# Patient Record
Sex: Female | Born: 1955 | Race: Black or African American | Hispanic: No | Marital: Married | Smoking: Never smoker
Health system: Southern US, Community
[De-identification: ages and names within clinical notes are randomized; demographics above are authoritative.]

## PROBLEM LIST (undated history)

## (undated) DIAGNOSIS — I10 Essential (primary) hypertension: Secondary | ICD-10-CM

## (undated) DIAGNOSIS — K760 Fatty (change of) liver, not elsewhere classified: Secondary | ICD-10-CM

## (undated) DIAGNOSIS — K429 Umbilical hernia without obstruction or gangrene: Secondary | ICD-10-CM

## (undated) DIAGNOSIS — M2550 Pain in unspecified joint: Secondary | ICD-10-CM

## (undated) DIAGNOSIS — K2970 Gastritis, unspecified, without bleeding: Secondary | ICD-10-CM

## (undated) DIAGNOSIS — J45909 Unspecified asthma, uncomplicated: Secondary | ICD-10-CM

## (undated) DIAGNOSIS — D649 Anemia, unspecified: Secondary | ICD-10-CM

## (undated) DIAGNOSIS — R600 Localized edema: Secondary | ICD-10-CM

## (undated) DIAGNOSIS — Z9889 Other specified postprocedural states: Secondary | ICD-10-CM

## (undated) DIAGNOSIS — M25559 Pain in unspecified hip: Secondary | ICD-10-CM

## (undated) DIAGNOSIS — K219 Gastro-esophageal reflux disease without esophagitis: Secondary | ICD-10-CM

## (undated) DIAGNOSIS — N951 Menopausal and female climacteric states: Secondary | ICD-10-CM

## (undated) DIAGNOSIS — E739 Lactose intolerance, unspecified: Secondary | ICD-10-CM

## (undated) DIAGNOSIS — Z86010 Personal history of colonic polyps: Secondary | ICD-10-CM

## (undated) DIAGNOSIS — I499 Cardiac arrhythmia, unspecified: Secondary | ICD-10-CM

## (undated) DIAGNOSIS — K648 Other hemorrhoids: Secondary | ICD-10-CM

## (undated) DIAGNOSIS — R112 Nausea with vomiting, unspecified: Secondary | ICD-10-CM

## (undated) DIAGNOSIS — R002 Palpitations: Secondary | ICD-10-CM

## (undated) DIAGNOSIS — I5032 Chronic diastolic (congestive) heart failure: Secondary | ICD-10-CM

## (undated) DIAGNOSIS — M1990 Unspecified osteoarthritis, unspecified site: Secondary | ICD-10-CM

## (undated) DIAGNOSIS — I4891 Unspecified atrial fibrillation: Secondary | ICD-10-CM

## (undated) DIAGNOSIS — E278 Other specified disorders of adrenal gland: Secondary | ICD-10-CM

## (undated) DIAGNOSIS — K449 Diaphragmatic hernia without obstruction or gangrene: Secondary | ICD-10-CM

## (undated) DIAGNOSIS — I480 Paroxysmal atrial fibrillation: Secondary | ICD-10-CM

## (undated) DIAGNOSIS — G4733 Obstructive sleep apnea (adult) (pediatric): Secondary | ICD-10-CM

## (undated) DIAGNOSIS — F339 Major depressive disorder, recurrent, unspecified: Secondary | ICD-10-CM

## (undated) DIAGNOSIS — J309 Allergic rhinitis, unspecified: Secondary | ICD-10-CM

## (undated) DIAGNOSIS — M549 Dorsalgia, unspecified: Secondary | ICD-10-CM

## (undated) DIAGNOSIS — T884XXA Failed or difficult intubation, initial encounter: Secondary | ICD-10-CM

## (undated) DIAGNOSIS — K5790 Diverticulosis of intestine, part unspecified, without perforation or abscess without bleeding: Secondary | ICD-10-CM

## (undated) DIAGNOSIS — L669 Cicatricial alopecia, unspecified: Secondary | ICD-10-CM

## (undated) HISTORY — DX: Pain in unspecified joint: M25.50

## (undated) HISTORY — PX: ABDOMINAL HYSTERECTOMY: SHX81

## (undated) HISTORY — DX: Essential (primary) hypertension: I10

## (undated) HISTORY — DX: Gastritis, unspecified, without bleeding: K29.70

## (undated) HISTORY — DX: Major depressive disorder, recurrent, unspecified: F33.9

## (undated) HISTORY — PX: CERVICAL FUSION: SHX112

## (undated) HISTORY — PX: BREAST REDUCTION SURGERY: SHX8

## (undated) HISTORY — DX: Unspecified atrial fibrillation: I48.91

## (undated) HISTORY — DX: Other specified disorders of adrenal gland: E27.8

## (undated) HISTORY — DX: Fatty (change of) liver, not elsewhere classified: K76.0

## (undated) HISTORY — DX: Palpitations: R00.2

## (undated) HISTORY — DX: Gastro-esophageal reflux disease without esophagitis: K21.9

## (undated) HISTORY — DX: Anemia, unspecified: D64.9

## (undated) HISTORY — PX: BUNIONECTOMY: SHX129

## (undated) HISTORY — DX: Diverticulosis of intestine, part unspecified, without perforation or abscess without bleeding: K57.90

## (undated) HISTORY — DX: Other hemorrhoids: K64.8

## (undated) HISTORY — DX: Unspecified asthma, uncomplicated: J45.909

## (undated) HISTORY — DX: Personal history of colonic polyps: Z86.010

## (undated) HISTORY — DX: Localized edema: R60.0

## (undated) HISTORY — DX: Paroxysmal atrial fibrillation: I48.0

## (undated) HISTORY — DX: Diaphragmatic hernia without obstruction or gangrene: K44.9

## (undated) HISTORY — DX: Chronic diastolic (congestive) heart failure: I50.32

## (undated) HISTORY — PX: TOTAL ABDOMINAL HYSTERECTOMY W/ BILATERAL SALPINGOOPHORECTOMY: SHX83

## (undated) HISTORY — PX: APPENDECTOMY: SHX54

## (undated) HISTORY — DX: Morbid (severe) obesity due to excess calories: E66.01

## (undated) HISTORY — DX: Menopausal and female climacteric states: N95.1

## (undated) HISTORY — DX: Pain in unspecified hip: M25.559

## (undated) HISTORY — DX: Cicatricial alopecia, unspecified: L66.9

## (undated) HISTORY — DX: Allergic rhinitis, unspecified: J30.9

## (undated) HISTORY — DX: Umbilical hernia without obstruction or gangrene: K42.9

## (undated) HISTORY — DX: Obstructive sleep apnea (adult) (pediatric): G47.33

## (undated) HISTORY — DX: Dorsalgia, unspecified: M54.9

## (undated) HISTORY — DX: Lactose intolerance, unspecified: E73.9

## (undated) HISTORY — PX: SPINE SURGERY: SHX786

---

## 1989-08-25 ENCOUNTER — Encounter: Payer: Self-pay | Admitting: Gastroenterology

## 1996-11-14 ENCOUNTER — Encounter: Payer: Self-pay | Admitting: Gastroenterology

## 1997-05-30 ENCOUNTER — Other Ambulatory Visit: Admission: RE | Admit: 1997-05-30 | Discharge: 1997-05-30 | Payer: Self-pay | Admitting: Obstetrics and Gynecology

## 1997-11-19 ENCOUNTER — Emergency Department (HOSPITAL_COMMUNITY): Admission: EM | Admit: 1997-11-19 | Discharge: 1997-11-19 | Payer: Self-pay

## 1997-12-25 ENCOUNTER — Encounter: Payer: Self-pay | Admitting: Emergency Medicine

## 1997-12-25 ENCOUNTER — Emergency Department (HOSPITAL_COMMUNITY): Admission: EM | Admit: 1997-12-25 | Discharge: 1997-12-25 | Payer: Self-pay | Admitting: Emergency Medicine

## 1998-01-18 ENCOUNTER — Encounter: Payer: Self-pay | Admitting: Gastroenterology

## 1998-06-01 ENCOUNTER — Ambulatory Visit (HOSPITAL_COMMUNITY): Admission: RE | Admit: 1998-06-01 | Discharge: 1998-06-01 | Payer: Self-pay | Admitting: Gastroenterology

## 1998-06-01 ENCOUNTER — Encounter: Payer: Self-pay | Admitting: Gastroenterology

## 1998-07-24 ENCOUNTER — Ambulatory Visit: Admission: RE | Admit: 1998-07-24 | Discharge: 1998-07-24 | Payer: Self-pay | Admitting: Internal Medicine

## 1999-03-04 ENCOUNTER — Encounter: Admission: RE | Admit: 1999-03-04 | Discharge: 1999-03-27 | Payer: Self-pay | Admitting: Internal Medicine

## 1999-05-07 ENCOUNTER — Emergency Department (HOSPITAL_COMMUNITY): Admission: EM | Admit: 1999-05-07 | Discharge: 1999-05-07 | Payer: Self-pay | Admitting: Emergency Medicine

## 1999-05-07 ENCOUNTER — Encounter: Payer: Self-pay | Admitting: Emergency Medicine

## 1999-05-11 ENCOUNTER — Encounter: Admission: RE | Admit: 1999-05-11 | Discharge: 1999-05-11 | Payer: Self-pay | Admitting: Internal Medicine

## 1999-05-11 ENCOUNTER — Encounter: Payer: Self-pay | Admitting: Internal Medicine

## 1999-10-15 ENCOUNTER — Ambulatory Visit (HOSPITAL_COMMUNITY): Admission: RE | Admit: 1999-10-15 | Discharge: 1999-10-15 | Payer: Self-pay | Admitting: Internal Medicine

## 1999-10-15 ENCOUNTER — Encounter: Payer: Self-pay | Admitting: Internal Medicine

## 2000-06-26 ENCOUNTER — Other Ambulatory Visit: Admission: RE | Admit: 2000-06-26 | Discharge: 2000-06-26 | Payer: Self-pay | Admitting: Family Medicine

## 2000-09-29 ENCOUNTER — Encounter: Payer: Self-pay | Admitting: Plastic Surgery

## 2000-09-29 ENCOUNTER — Ambulatory Visit (HOSPITAL_COMMUNITY): Admission: RE | Admit: 2000-09-29 | Discharge: 2000-09-29 | Payer: Self-pay | Admitting: Plastic Surgery

## 2000-10-06 ENCOUNTER — Encounter (INDEPENDENT_AMBULATORY_CARE_PROVIDER_SITE_OTHER): Payer: Self-pay | Admitting: Specialist

## 2000-10-06 ENCOUNTER — Ambulatory Visit (HOSPITAL_BASED_OUTPATIENT_CLINIC_OR_DEPARTMENT_OTHER): Admission: RE | Admit: 2000-10-06 | Discharge: 2000-10-07 | Payer: Self-pay | Admitting: Plastic Surgery

## 2000-10-17 ENCOUNTER — Emergency Department (HOSPITAL_COMMUNITY): Admission: EM | Admit: 2000-10-17 | Discharge: 2000-10-17 | Payer: Self-pay

## 2002-02-09 ENCOUNTER — Encounter: Payer: Self-pay | Admitting: Obstetrics and Gynecology

## 2002-02-09 ENCOUNTER — Ambulatory Visit (HOSPITAL_COMMUNITY): Admission: RE | Admit: 2002-02-09 | Discharge: 2002-02-09 | Payer: Self-pay | Admitting: Obstetrics and Gynecology

## 2002-06-03 ENCOUNTER — Encounter: Payer: Self-pay | Admitting: Gastroenterology

## 2003-01-31 ENCOUNTER — Encounter: Payer: Self-pay | Admitting: Cardiology

## 2003-01-31 ENCOUNTER — Inpatient Hospital Stay (HOSPITAL_COMMUNITY): Admission: EM | Admit: 2003-01-31 | Discharge: 2003-02-01 | Payer: Self-pay | Admitting: Emergency Medicine

## 2003-05-02 ENCOUNTER — Encounter: Admission: RE | Admit: 2003-05-02 | Discharge: 2003-05-02 | Payer: Self-pay | Admitting: Obstetrics and Gynecology

## 2003-11-14 ENCOUNTER — Encounter: Admission: RE | Admit: 2003-11-14 | Discharge: 2003-11-14 | Payer: Self-pay | Admitting: Obstetrics and Gynecology

## 2003-12-14 ENCOUNTER — Emergency Department (HOSPITAL_COMMUNITY): Admission: EM | Admit: 2003-12-14 | Discharge: 2003-12-14 | Payer: Self-pay | Admitting: Emergency Medicine

## 2004-01-31 ENCOUNTER — Ambulatory Visit: Payer: Self-pay | Admitting: Family Medicine

## 2004-02-08 ENCOUNTER — Ambulatory Visit: Payer: Self-pay | Admitting: Family Medicine

## 2004-04-09 ENCOUNTER — Inpatient Hospital Stay (HOSPITAL_COMMUNITY): Admission: RE | Admit: 2004-04-09 | Discharge: 2004-04-11 | Payer: Self-pay | Admitting: Obstetrics and Gynecology

## 2004-04-09 ENCOUNTER — Encounter (INDEPENDENT_AMBULATORY_CARE_PROVIDER_SITE_OTHER): Payer: Self-pay | Admitting: Specialist

## 2004-04-09 ENCOUNTER — Encounter (INDEPENDENT_AMBULATORY_CARE_PROVIDER_SITE_OTHER): Payer: Self-pay | Admitting: *Deleted

## 2004-04-14 ENCOUNTER — Inpatient Hospital Stay (HOSPITAL_COMMUNITY): Admission: AD | Admit: 2004-04-14 | Discharge: 2004-04-14 | Payer: Self-pay | Admitting: Obstetrics and Gynecology

## 2004-04-16 ENCOUNTER — Inpatient Hospital Stay (HOSPITAL_COMMUNITY): Admission: AD | Admit: 2004-04-16 | Discharge: 2004-04-18 | Payer: Self-pay | Admitting: Obstetrics and Gynecology

## 2004-04-30 ENCOUNTER — Encounter: Payer: Self-pay | Admitting: Gastroenterology

## 2004-04-30 ENCOUNTER — Inpatient Hospital Stay (HOSPITAL_COMMUNITY): Admission: AD | Admit: 2004-04-30 | Discharge: 2004-05-07 | Payer: Self-pay | Admitting: Gastroenterology

## 2004-04-30 ENCOUNTER — Encounter (INDEPENDENT_AMBULATORY_CARE_PROVIDER_SITE_OTHER): Payer: Self-pay | Admitting: *Deleted

## 2004-05-04 ENCOUNTER — Ambulatory Visit: Payer: Self-pay | Admitting: Internal Medicine

## 2004-10-25 ENCOUNTER — Emergency Department (HOSPITAL_COMMUNITY): Admission: EM | Admit: 2004-10-25 | Discharge: 2004-10-25 | Payer: Self-pay | Admitting: Emergency Medicine

## 2004-12-05 ENCOUNTER — Encounter: Admission: RE | Admit: 2004-12-05 | Discharge: 2004-12-05 | Payer: Self-pay | Admitting: Obstetrics and Gynecology

## 2005-02-04 ENCOUNTER — Ambulatory Visit: Payer: Self-pay | Admitting: Family Medicine

## 2005-02-11 ENCOUNTER — Ambulatory Visit: Payer: Self-pay | Admitting: Family Medicine

## 2005-02-19 ENCOUNTER — Ambulatory Visit: Payer: Self-pay | Admitting: Family Medicine

## 2005-03-06 ENCOUNTER — Encounter: Admission: RE | Admit: 2005-03-06 | Discharge: 2005-06-04 | Payer: Self-pay | Admitting: Family Medicine

## 2005-07-30 ENCOUNTER — Emergency Department (HOSPITAL_COMMUNITY): Admission: EM | Admit: 2005-07-30 | Discharge: 2005-07-30 | Payer: Self-pay | Admitting: Emergency Medicine

## 2006-01-19 ENCOUNTER — Ambulatory Visit: Payer: Self-pay | Admitting: Family Medicine

## 2006-02-10 ENCOUNTER — Ambulatory Visit: Payer: Self-pay | Admitting: Internal Medicine

## 2006-02-10 ENCOUNTER — Ambulatory Visit: Payer: Self-pay | Admitting: Cardiology

## 2006-02-10 ENCOUNTER — Inpatient Hospital Stay (HOSPITAL_COMMUNITY): Admission: EM | Admit: 2006-02-10 | Discharge: 2006-02-12 | Payer: Self-pay | Admitting: Emergency Medicine

## 2006-02-11 ENCOUNTER — Encounter: Payer: Self-pay | Admitting: Cardiology

## 2006-03-12 ENCOUNTER — Ambulatory Visit: Payer: Self-pay | Admitting: Family Medicine

## 2006-03-20 ENCOUNTER — Ambulatory Visit: Payer: Self-pay

## 2006-03-24 ENCOUNTER — Ambulatory Visit: Payer: Self-pay | Admitting: Critical Care Medicine

## 2006-03-25 ENCOUNTER — Ambulatory Visit: Payer: Self-pay | Admitting: Family Medicine

## 2006-03-25 LAB — CONVERTED CEMR LAB
ALT: 25 units/L (ref 0–40)
AST: 20 units/L (ref 0–37)
Albumin: 3.4 g/dL — ABNORMAL LOW (ref 3.5–5.2)
Alkaline Phosphatase: 49 units/L (ref 39–117)
BUN: 9 mg/dL (ref 6–23)
Basophils Absolute: 0 10*3/uL (ref 0.0–0.1)
Basophils Relative: 0 % (ref 0.0–1.0)
Bilirubin, Direct: 0.1 mg/dL (ref 0.0–0.3)
CO2: 31 meq/L (ref 19–32)
Calcium: 9.2 mg/dL (ref 8.4–10.5)
Chloride: 109 meq/L (ref 96–112)
Cholesterol: 127 mg/dL (ref 0–200)
Creatinine, Ser: 0.8 mg/dL (ref 0.4–1.2)
Eosinophils Absolute: 0.1 10*3/uL (ref 0.0–0.6)
Eosinophils Relative: 1.9 % (ref 0.0–5.0)
Free T4: 0.8 ng/dL (ref 0.6–1.6)
GFR calc Af Amer: 98 mL/min
GFR calc non Af Amer: 81 mL/min
Glucose, Bld: 90 mg/dL (ref 70–99)
HCT: 38.5 % (ref 36.0–46.0)
HDL: 40.7 mg/dL (ref >39.0)
Hemoglobin: 13.2 g/dL (ref 12.0–15.0)
LDL Cholesterol: 57 mg/dL (ref 0–99)
Lymphocytes Relative: 51.5 % — ABNORMAL HIGH (ref 12.0–46.0)
MCHC: 34.1 g/dL (ref 30.0–36.0)
MCV: 96.8 fL (ref 78.0–100.0)
Monocytes Absolute: 0.4 10*3/uL (ref 0.2–0.7)
Monocytes Relative: 7.4 % (ref 3.0–11.0)
Neutro Abs: 2.2 10*3/uL (ref 1.4–7.7)
Neutrophils Relative %: 39.2 % — ABNORMAL LOW (ref 43.0–77.0)
Platelets: 373 10*3/uL (ref 150–400)
Potassium: 3.8 meq/L (ref 3.5–5.1)
RBC: 3.98 M/uL (ref 3.87–5.11)
RDW: 12.8 % (ref 11.5–14.6)
Sodium: 145 meq/L (ref 135–145)
T3, Free: 3.2 pg/mL (ref 2.3–4.2)
TSH: 1.36 microintl units/mL (ref 0.35–5.50)
Total Bilirubin: 0.6 mg/dL (ref 0.3–1.2)
Total CHOL/HDL Ratio: 3.1
Total Protein: 6.6 g/dL (ref 6.0–8.3)
Triglycerides: 148 mg/dL (ref 0–149)
VLDL: 30 mg/dL (ref 0–40)
WBC: 5.7 10*3/uL (ref 4.5–10.5)

## 2006-03-26 ENCOUNTER — Ambulatory Visit: Payer: Self-pay | Admitting: Internal Medicine

## 2006-04-06 ENCOUNTER — Encounter: Admission: RE | Admit: 2006-04-06 | Discharge: 2006-04-06 | Payer: Self-pay | Admitting: Family Medicine

## 2006-06-15 ENCOUNTER — Encounter: Payer: Self-pay | Admitting: Family Medicine

## 2006-06-25 DIAGNOSIS — I10 Essential (primary) hypertension: Secondary | ICD-10-CM | POA: Insufficient documentation

## 2006-06-25 DIAGNOSIS — D649 Anemia, unspecified: Secondary | ICD-10-CM | POA: Insufficient documentation

## 2006-06-25 DIAGNOSIS — I4891 Unspecified atrial fibrillation: Secondary | ICD-10-CM | POA: Insufficient documentation

## 2006-06-25 HISTORY — DX: Essential (primary) hypertension: I10

## 2006-07-20 ENCOUNTER — Ambulatory Visit: Payer: Self-pay | Admitting: Family Medicine

## 2006-08-03 ENCOUNTER — Ambulatory Visit: Payer: Self-pay | Admitting: Internal Medicine

## 2006-08-04 ENCOUNTER — Telehealth (INDEPENDENT_AMBULATORY_CARE_PROVIDER_SITE_OTHER): Payer: Self-pay | Admitting: *Deleted

## 2006-08-04 DIAGNOSIS — E049 Nontoxic goiter, unspecified: Secondary | ICD-10-CM | POA: Insufficient documentation

## 2006-08-11 ENCOUNTER — Encounter (INDEPENDENT_AMBULATORY_CARE_PROVIDER_SITE_OTHER): Payer: Self-pay | Admitting: *Deleted

## 2006-08-17 ENCOUNTER — Encounter: Admission: RE | Admit: 2006-08-17 | Discharge: 2006-08-17 | Payer: Self-pay | Admitting: Family Medicine

## 2006-08-18 ENCOUNTER — Telehealth (INDEPENDENT_AMBULATORY_CARE_PROVIDER_SITE_OTHER): Payer: Self-pay | Admitting: *Deleted

## 2006-08-18 DIAGNOSIS — E041 Nontoxic single thyroid nodule: Secondary | ICD-10-CM | POA: Insufficient documentation

## 2006-09-08 ENCOUNTER — Other Ambulatory Visit: Admission: RE | Admit: 2006-09-08 | Discharge: 2006-09-08 | Payer: Self-pay | Admitting: Interventional Radiology

## 2006-09-08 ENCOUNTER — Encounter: Admission: RE | Admit: 2006-09-08 | Discharge: 2006-09-08 | Payer: Self-pay | Admitting: Endocrinology

## 2006-09-08 ENCOUNTER — Encounter (INDEPENDENT_AMBULATORY_CARE_PROVIDER_SITE_OTHER): Payer: Self-pay | Admitting: Interventional Radiology

## 2006-09-09 ENCOUNTER — Encounter: Payer: Self-pay | Admitting: Family Medicine

## 2006-10-01 ENCOUNTER — Ambulatory Visit: Payer: Self-pay | Admitting: Internal Medicine

## 2006-10-19 ENCOUNTER — Ambulatory Visit: Payer: Self-pay | Admitting: Gastroenterology

## 2006-10-19 LAB — CONVERTED CEMR LAB
ALT: 39 units/L — ABNORMAL HIGH (ref 0–35)
AST: 34 units/L (ref 0–37)
Albumin: 3.9 g/dL (ref 3.5–5.2)
Alkaline Phosphatase: 56 units/L (ref 39–117)
BUN: 5 mg/dL — ABNORMAL LOW (ref 6–23)
Basophils Absolute: 0 10*3/uL (ref 0.0–0.1)
Basophils Relative: 0.2 % (ref 0.0–1.0)
CO2: 28 meq/L (ref 19–32)
Calcium: 9.5 mg/dL (ref 8.4–10.5)
Chloride: 104 meq/L (ref 96–112)
Creatinine, Ser: 0.8 mg/dL (ref 0.4–1.2)
Eosinophils Absolute: 0.2 10*3/uL (ref 0.0–0.6)
Eosinophils Relative: 3 % (ref 0.0–5.0)
GFR calc Af Amer: 98 mL/min
GFR calc non Af Amer: 81 mL/min
Glucose, Bld: 95 mg/dL (ref 70–99)
HCT: 36.4 % (ref 36.0–46.0)
Hemoglobin: 12.5 g/dL (ref 12.0–15.0)
Lipase: 18 units/L (ref 11.0–59.0)
Lymphocytes Relative: 51.9 % — ABNORMAL HIGH (ref 12.0–46.0)
MCHC: 34.3 g/dL (ref 30.0–36.0)
MCV: 98.2 fL (ref 78.0–100.0)
Monocytes Absolute: 0.5 10*3/uL (ref 0.2–0.7)
Monocytes Relative: 8.9 % (ref 3.0–11.0)
Neutro Abs: 2 10*3/uL (ref 1.4–7.7)
Neutrophils Relative %: 36 % — ABNORMAL LOW (ref 43.0–77.0)
Platelets: 315 10*3/uL (ref 150–400)
Potassium: 3.7 meq/L (ref 3.5–5.1)
RBC: 3.71 M/uL — ABNORMAL LOW (ref 3.87–5.11)
RDW: 13.6 % (ref 11.5–14.6)
Sodium: 141 meq/L (ref 135–145)
Total Bilirubin: 0.8 mg/dL (ref 0.3–1.2)
Total Protein: 7 g/dL (ref 6.0–8.3)
WBC: 5.5 10*3/uL (ref 4.5–10.5)

## 2006-10-20 ENCOUNTER — Ambulatory Visit: Payer: Self-pay | Admitting: Cardiology

## 2006-11-02 ENCOUNTER — Ambulatory Visit: Payer: Self-pay | Admitting: Gastroenterology

## 2006-11-04 ENCOUNTER — Ambulatory Visit: Payer: Self-pay | Admitting: Internal Medicine

## 2006-11-04 DIAGNOSIS — R609 Edema, unspecified: Secondary | ICD-10-CM | POA: Insufficient documentation

## 2007-02-10 ENCOUNTER — Ambulatory Visit: Payer: Self-pay | Admitting: Gastroenterology

## 2007-02-10 LAB — CONVERTED CEMR LAB
ALT: 27 units/L (ref 0–35)
AST: 22 units/L (ref 0–37)
Albumin: 3.7 g/dL (ref 3.5–5.2)
Alkaline Phosphatase: 61 units/L (ref 39–117)
Bilirubin, Direct: 0.2 mg/dL (ref 0.0–0.3)
Total Bilirubin: 0.7 mg/dL (ref 0.3–1.2)
Total Protein: 7.1 g/dL (ref 6.0–8.3)

## 2007-03-03 DIAGNOSIS — G4733 Obstructive sleep apnea (adult) (pediatric): Secondary | ICD-10-CM | POA: Insufficient documentation

## 2007-03-03 DIAGNOSIS — K648 Other hemorrhoids: Secondary | ICD-10-CM | POA: Insufficient documentation

## 2007-03-03 DIAGNOSIS — I498 Other specified cardiac arrhythmias: Secondary | ICD-10-CM | POA: Insufficient documentation

## 2007-03-03 DIAGNOSIS — F339 Major depressive disorder, recurrent, unspecified: Secondary | ICD-10-CM | POA: Insufficient documentation

## 2007-03-03 DIAGNOSIS — K219 Gastro-esophageal reflux disease without esophagitis: Secondary | ICD-10-CM | POA: Insufficient documentation

## 2007-03-03 DIAGNOSIS — D126 Benign neoplasm of colon, unspecified: Secondary | ICD-10-CM | POA: Insufficient documentation

## 2007-03-03 DIAGNOSIS — K760 Fatty (change of) liver, not elsewhere classified: Secondary | ICD-10-CM | POA: Insufficient documentation

## 2007-03-03 DIAGNOSIS — K5790 Diverticulosis of intestine, part unspecified, without perforation or abscess without bleeding: Secondary | ICD-10-CM | POA: Insufficient documentation

## 2007-03-03 HISTORY — DX: Fatty (change of) liver, not elsewhere classified: K76.0

## 2007-03-03 HISTORY — DX: Diverticulosis of intestine, part unspecified, without perforation or abscess without bleeding: K57.90

## 2007-03-03 HISTORY — DX: Gastro-esophageal reflux disease without esophagitis: K21.9

## 2007-03-03 HISTORY — DX: Obstructive sleep apnea (adult) (pediatric): G47.33

## 2007-03-03 HISTORY — DX: Other hemorrhoids: K64.8

## 2007-03-03 HISTORY — DX: Major depressive disorder, recurrent, unspecified: F33.9

## 2007-03-27 ENCOUNTER — Encounter: Admission: RE | Admit: 2007-03-27 | Discharge: 2007-03-27 | Payer: Self-pay | Admitting: Psychiatry

## 2007-04-28 ENCOUNTER — Ambulatory Visit: Payer: Self-pay | Admitting: Gastroenterology

## 2007-04-30 ENCOUNTER — Telehealth (INDEPENDENT_AMBULATORY_CARE_PROVIDER_SITE_OTHER): Payer: Self-pay | Admitting: *Deleted

## 2007-05-14 ENCOUNTER — Encounter: Payer: Self-pay | Admitting: Family Medicine

## 2007-05-14 ENCOUNTER — Ambulatory Visit: Payer: Self-pay | Admitting: Gastroenterology

## 2007-05-14 ENCOUNTER — Encounter: Payer: Self-pay | Admitting: Gastroenterology

## 2007-05-17 ENCOUNTER — Encounter: Payer: Self-pay | Admitting: Gastroenterology

## 2007-06-11 ENCOUNTER — Emergency Department (HOSPITAL_COMMUNITY): Admission: EM | Admit: 2007-06-11 | Discharge: 2007-06-11 | Payer: Self-pay | Admitting: Emergency Medicine

## 2007-07-21 LAB — CONVERTED CEMR LAB: Pap Smear: NORMAL

## 2007-08-09 ENCOUNTER — Telehealth (INDEPENDENT_AMBULATORY_CARE_PROVIDER_SITE_OTHER): Payer: Self-pay | Admitting: *Deleted

## 2007-08-09 ENCOUNTER — Encounter: Admission: RE | Admit: 2007-08-09 | Discharge: 2007-08-09 | Payer: Self-pay | Admitting: Obstetrics and Gynecology

## 2007-08-12 ENCOUNTER — Telehealth: Payer: Self-pay | Admitting: Gastroenterology

## 2007-09-04 LAB — HM COLONOSCOPY: HM Colonoscopy: 2

## 2007-11-18 ENCOUNTER — Telehealth (INDEPENDENT_AMBULATORY_CARE_PROVIDER_SITE_OTHER): Payer: Self-pay | Admitting: *Deleted

## 2007-12-13 ENCOUNTER — Ambulatory Visit: Payer: Self-pay | Admitting: Family Medicine

## 2007-12-27 ENCOUNTER — Encounter (INDEPENDENT_AMBULATORY_CARE_PROVIDER_SITE_OTHER): Payer: Self-pay | Admitting: *Deleted

## 2007-12-27 LAB — CONVERTED CEMR LAB
ALT: 24 units/L (ref 0–35)
AST: 21 units/L (ref 0–37)
Albumin: 3.6 g/dL (ref 3.5–5.2)
Alkaline Phosphatase: 52 units/L (ref 39–117)
BUN: 10 mg/dL (ref 6–23)
Basophils Absolute: 0 10*3/uL (ref 0.0–0.1)
Basophils Relative: 0.4 % (ref 0.0–3.0)
Bilirubin, Direct: 0.1 mg/dL (ref 0.0–0.3)
CO2: 27 meq/L (ref 19–32)
Calcium: 9.4 mg/dL (ref 8.4–10.5)
Chloride: 108 meq/L (ref 96–112)
Cholesterol: 135 mg/dL (ref 0–200)
Creatinine, Ser: 0.7 mg/dL (ref 0.4–1.2)
Eosinophils Absolute: 0.2 10*3/uL (ref 0.0–0.7)
Eosinophils Relative: 3 % (ref 0.0–5.0)
GFR calc Af Amer: 114 mL/min
GFR calc non Af Amer: 94 mL/min
Glucose, Bld: 88 mg/dL (ref 70–99)
HCT: 39.8 % (ref 36.0–46.0)
HDL: 39.4 mg/dL (ref >39.0)
Hemoglobin: 13.6 g/dL (ref 12.0–15.0)
Iron: 76 ug/dL (ref 42–145)
LDL Cholesterol: 73 mg/dL (ref 0–99)
Lymphocytes Relative: 49 % — ABNORMAL HIGH (ref 12.0–46.0)
MCHC: 34.1 g/dL (ref 30.0–36.0)
MCV: 97.9 fL (ref 78.0–100.0)
Monocytes Absolute: 0.6 10*3/uL (ref 0.1–1.0)
Monocytes Relative: 10.2 % (ref 3.0–12.0)
Neutro Abs: 2 10*3/uL (ref 1.4–7.7)
Neutrophils Relative %: 37.4 % — ABNORMAL LOW (ref 43.0–77.0)
Platelets: 310 10*3/uL (ref 150–400)
Potassium: 3.7 meq/L (ref 3.5–5.1)
RBC: 4.06 M/uL (ref 3.87–5.11)
RDW: 13.1 % (ref 11.5–14.6)
Saturation Ratios: 26.6 % (ref 20.0–50.0)
Sodium: 144 meq/L (ref 135–145)
TSH: 0.71 microintl units/mL (ref 0.35–5.50)
Total Bilirubin: 0.5 mg/dL (ref 0.3–1.2)
Total CHOL/HDL Ratio: 3.4
Total Protein: 7.1 g/dL (ref 6.0–8.3)
Transferrin: 203.7 mg/dL — ABNORMAL LOW (ref >=212.0)
Triglycerides: 111 mg/dL (ref 0–149)
VLDL: 22 mg/dL (ref 0–40)
WBC: 5.4 10*3/uL (ref 4.5–10.5)

## 2008-01-28 ENCOUNTER — Ambulatory Visit: Payer: Self-pay | Admitting: Family Medicine

## 2008-01-28 DIAGNOSIS — R079 Chest pain, unspecified: Secondary | ICD-10-CM | POA: Insufficient documentation

## 2008-02-17 ENCOUNTER — Telehealth (INDEPENDENT_AMBULATORY_CARE_PROVIDER_SITE_OTHER): Payer: Self-pay | Admitting: *Deleted

## 2008-02-18 ENCOUNTER — Ambulatory Visit: Payer: Self-pay | Admitting: Family Medicine

## 2008-04-20 ENCOUNTER — Telehealth: Payer: Self-pay | Admitting: Gastroenterology

## 2008-04-21 ENCOUNTER — Ambulatory Visit: Payer: Self-pay | Admitting: Gastroenterology

## 2008-04-21 DIAGNOSIS — Z860100 Personal history of colon polyps, unspecified: Secondary | ICD-10-CM

## 2008-04-21 DIAGNOSIS — R1012 Left upper quadrant pain: Secondary | ICD-10-CM | POA: Insufficient documentation

## 2008-04-21 HISTORY — DX: Personal history of colonic polyps: Z86.010

## 2008-04-21 HISTORY — DX: Personal history of colon polyps, unspecified: Z86.0100

## 2008-04-21 LAB — CONVERTED CEMR LAB
ALT: 31 units/L (ref 0–35)
AST: 24 units/L (ref 0–37)
Albumin: 3.7 g/dL (ref 3.5–5.2)
Alkaline Phosphatase: 56 units/L (ref 39–117)
Basophils Absolute: 0.1 10*3/uL (ref 0.0–0.1)
Basophils Relative: 1.6 % (ref 0.0–3.0)
Bilirubin, Direct: 0.1 mg/dL (ref 0.0–0.3)
Eosinophils Absolute: 0.1 10*3/uL (ref 0.0–0.7)
Eosinophils Relative: 2.4 % (ref 0.0–5.0)
HCT: 40.8 % (ref 36.0–46.0)
Hemoglobin: 13.8 g/dL (ref 12.0–15.0)
Lipase: 20 units/L (ref 11.0–59.0)
Lymphocytes Relative: 47.5 % — ABNORMAL HIGH (ref 12.0–46.0)
Lymphs Abs: 2.7 10*3/uL (ref 0.7–4.0)
MCHC: 33.9 g/dL (ref 30.0–36.0)
MCV: 95.9 fL (ref 78.0–100.0)
Monocytes Absolute: 0.5 10*3/uL (ref 0.1–1.0)
Monocytes Relative: 9.7 % (ref 3.0–12.0)
Neutro Abs: 2.1 10*3/uL (ref 1.4–7.7)
Neutrophils Relative %: 38.8 % — ABNORMAL LOW (ref 43.0–77.0)
Platelets: 350 10*3/uL (ref 150.0–400.0)
RBC: 4.25 M/uL (ref 3.87–5.11)
RDW: 12.9 % (ref 11.5–14.6)
Total Bilirubin: 0.6 mg/dL (ref 0.3–1.2)
Total Protein: 7.4 g/dL (ref 6.0–8.3)
WBC: 5.5 10*3/uL (ref 4.5–10.5)

## 2008-05-08 ENCOUNTER — Telehealth: Payer: Self-pay | Admitting: Family Medicine

## 2008-05-23 ENCOUNTER — Ambulatory Visit: Payer: Self-pay | Admitting: Family Medicine

## 2008-06-01 ENCOUNTER — Encounter: Admission: RE | Admit: 2008-06-01 | Discharge: 2008-06-01 | Payer: Self-pay | Admitting: Obstetrics and Gynecology

## 2008-07-17 ENCOUNTER — Telehealth: Payer: Self-pay | Admitting: Cardiovascular Disease

## 2008-07-17 ENCOUNTER — Telehealth (INDEPENDENT_AMBULATORY_CARE_PROVIDER_SITE_OTHER): Payer: Self-pay | Admitting: *Deleted

## 2008-07-17 ENCOUNTER — Observation Stay (HOSPITAL_COMMUNITY): Admission: EM | Admit: 2008-07-17 | Discharge: 2008-07-19 | Payer: Self-pay | Admitting: Emergency Medicine

## 2008-07-18 ENCOUNTER — Ambulatory Visit: Payer: Self-pay | Admitting: Cardiology

## 2008-07-19 ENCOUNTER — Encounter: Payer: Self-pay | Admitting: Cardiology

## 2008-08-15 ENCOUNTER — Telehealth (INDEPENDENT_AMBULATORY_CARE_PROVIDER_SITE_OTHER): Payer: Self-pay | Admitting: *Deleted

## 2008-08-29 ENCOUNTER — Ambulatory Visit: Payer: Self-pay | Admitting: Internal Medicine

## 2008-09-04 ENCOUNTER — Ambulatory Visit: Payer: Self-pay | Admitting: Family Medicine

## 2008-09-04 DIAGNOSIS — N951 Menopausal and female climacteric states: Secondary | ICD-10-CM | POA: Insufficient documentation

## 2008-09-26 ENCOUNTER — Encounter: Admission: RE | Admit: 2008-09-26 | Discharge: 2008-09-26 | Payer: Self-pay | Admitting: Obstetrics and Gynecology

## 2008-12-04 ENCOUNTER — Ambulatory Visit: Payer: Self-pay | Admitting: Family Medicine

## 2008-12-11 ENCOUNTER — Emergency Department (HOSPITAL_COMMUNITY): Admission: EM | Admit: 2008-12-11 | Discharge: 2008-12-11 | Payer: Self-pay | Admitting: Emergency Medicine

## 2009-01-23 ENCOUNTER — Ambulatory Visit: Payer: Self-pay | Admitting: Family Medicine

## 2009-01-23 DIAGNOSIS — J45909 Unspecified asthma, uncomplicated: Secondary | ICD-10-CM | POA: Insufficient documentation

## 2009-01-23 HISTORY — DX: Unspecified asthma, uncomplicated: J45.909

## 2009-01-24 ENCOUNTER — Ambulatory Visit: Payer: Self-pay | Admitting: Family Medicine

## 2009-01-25 ENCOUNTER — Telehealth: Payer: Self-pay | Admitting: Family Medicine

## 2009-01-25 DIAGNOSIS — R059 Cough, unspecified: Secondary | ICD-10-CM | POA: Insufficient documentation

## 2009-01-25 DIAGNOSIS — R05 Cough: Secondary | ICD-10-CM

## 2009-02-05 ENCOUNTER — Ambulatory Visit: Payer: Self-pay | Admitting: Family Medicine

## 2009-02-05 LAB — CONVERTED CEMR LAB
Inflenza A Ag: NEGATIVE
Influenza B Ag: NEGATIVE

## 2009-02-06 ENCOUNTER — Telehealth: Payer: Self-pay | Admitting: Family Medicine

## 2009-02-06 LAB — CONVERTED CEMR LAB
BUN: 6 mg/dL (ref 6–23)
Basophils Absolute: 0 10*3/uL (ref 0.0–0.1)
Basophils Relative: 0.7 % (ref 0.0–3.0)
CO2: 26 meq/L (ref 19–32)
Calcium: 9.7 mg/dL (ref 8.4–10.5)
Chloride: 102 meq/L (ref 96–112)
Creatinine, Ser: 0.9 mg/dL (ref 0.4–1.2)
Eosinophils Absolute: 0.4 10*3/uL (ref 0.0–0.7)
Eosinophils Relative: 5.2 % — ABNORMAL HIGH (ref 0.0–5.0)
GFR calc non Af Amer: 84.62 mL/min (ref >60)
Glucose, Bld: 86 mg/dL (ref 70–99)
HCT: 44.4 % (ref 36.0–46.0)
Hemoglobin: 14.5 g/dL (ref 12.0–15.0)
Lymphocytes Relative: 17.3 % (ref 12.0–46.0)
Lymphs Abs: 1.2 10*3/uL (ref 0.7–4.0)
MCHC: 32.6 g/dL (ref 30.0–36.0)
MCV: 101.2 fL — ABNORMAL HIGH (ref 78.0–100.0)
Monocytes Absolute: 0.4 10*3/uL (ref 0.1–1.0)
Monocytes Relative: 5.1 % (ref 3.0–12.0)
Neutro Abs: 5.1 10*3/uL (ref 1.4–7.7)
Neutrophils Relative %: 71.7 % (ref 43.0–77.0)
Platelets: 258 10*3/uL (ref 150.0–400.0)
Potassium: 4.2 meq/L (ref 3.5–5.1)
RBC: 4.38 M/uL (ref 3.87–5.11)
RDW: 14.1 % (ref 11.5–14.6)
Sodium: 139 meq/L (ref 135–145)
WBC: 7.1 10*3/uL (ref 4.5–10.5)

## 2009-02-13 ENCOUNTER — Ambulatory Visit: Payer: Self-pay | Admitting: Pulmonary Disease

## 2009-02-27 ENCOUNTER — Ambulatory Visit: Payer: Self-pay | Admitting: Pulmonary Disease

## 2009-03-07 ENCOUNTER — Telehealth: Payer: Self-pay | Admitting: Gastroenterology

## 2009-03-09 ENCOUNTER — Ambulatory Visit: Payer: Self-pay | Admitting: Family Medicine

## 2009-03-09 LAB — CONVERTED CEMR LAB
Bilirubin Urine: NEGATIVE
Glucose, Urine, Semiquant: NEGATIVE
Nitrite: NEGATIVE
Specific Gravity, Urine: 1.015
Urobilinogen, UA: 0.2
WBC Urine, dipstick: NEGATIVE
pH: 6

## 2009-03-11 LAB — CONVERTED CEMR LAB
ALT: 16 units/L (ref 0–35)
AST: 15 units/L (ref 0–37)
Albumin: 4.1 g/dL (ref 3.5–5.2)
Alkaline Phosphatase: 62 units/L (ref 39–117)
Amylase: 44 units/L (ref 0–105)
BUN: 7 mg/dL (ref 6–23)
Bacteria, UA: NONE SEEN
Basophils Absolute: 0 10*3/uL (ref 0.0–0.1)
Basophils Relative: 1 % (ref 0–1)
CO2: 23 meq/L (ref 19–32)
Calcium: 9.6 mg/dL (ref 8.4–10.5)
Casts: NONE SEEN /lpf
Chloride: 104 meq/L (ref 96–112)
Creatinine, Ser: 0.8 mg/dL (ref 0.40–1.20)
Crystals: NONE SEEN
Eosinophils Absolute: 0.2 10*3/uL (ref 0.0–0.7)
Eosinophils Relative: 3 % (ref 0–5)
Glucose, Bld: 83 mg/dL (ref 70–99)
HCT: 42.5 % (ref 36.0–46.0)
Hemoglobin: 14.3 g/dL (ref 12.0–15.0)
Lipase: 13 units/L (ref 0–75)
Lymphocytes Relative: 50 % — ABNORMAL HIGH (ref 12–46)
Lymphs Abs: 3.2 10*3/uL (ref 0.7–4.0)
MCHC: 33.6 g/dL (ref 30.0–36.0)
MCV: 96.8 fL (ref 78.0–100.0)
Monocytes Absolute: 0.6 10*3/uL (ref 0.1–1.0)
Monocytes Relative: 9 % (ref 3–12)
Neutro Abs: 2.4 10*3/uL (ref 1.7–7.7)
Neutrophils Relative %: 38 % — ABNORMAL LOW (ref 43–77)
Platelets: 371 10*3/uL (ref 150–400)
Potassium: 4.1 meq/L (ref 3.5–5.3)
RBC / HPF: NONE SEEN (ref <3)
RBC: 4.39 M/uL (ref 3.87–5.11)
RDW: 13.1 % (ref 11.5–15.5)
Sodium: 141 meq/L (ref 135–145)
Squamous Epithelial / LPF: NONE SEEN /lpf
Total Bilirubin: 0.5 mg/dL (ref 0.3–1.2)
Total Protein: 7.4 g/dL (ref 6.0–8.3)
WBC, UA: NONE SEEN cells/hpf (ref <3)
WBC: 6.4 10*3/uL (ref 4.0–10.5)

## 2009-03-13 ENCOUNTER — Encounter: Payer: Self-pay | Admitting: Family Medicine

## 2009-04-02 ENCOUNTER — Ambulatory Visit: Payer: Self-pay | Admitting: Emergency Medicine

## 2009-04-03 DIAGNOSIS — J309 Allergic rhinitis, unspecified: Secondary | ICD-10-CM

## 2009-04-03 HISTORY — DX: Allergic rhinitis, unspecified: J30.9

## 2009-04-10 ENCOUNTER — Telehealth (INDEPENDENT_AMBULATORY_CARE_PROVIDER_SITE_OTHER): Payer: Self-pay | Admitting: *Deleted

## 2009-07-30 ENCOUNTER — Telehealth (INDEPENDENT_AMBULATORY_CARE_PROVIDER_SITE_OTHER): Payer: Self-pay | Admitting: *Deleted

## 2009-08-06 ENCOUNTER — Ambulatory Visit: Payer: Self-pay | Admitting: Family Medicine

## 2009-08-21 ENCOUNTER — Ambulatory Visit: Payer: Self-pay | Admitting: Family Medicine

## 2009-08-24 LAB — CONVERTED CEMR LAB
BUN: 11 mg/dL (ref 6–23)
CO2: 26 meq/L (ref 19–32)
Calcium: 9.3 mg/dL (ref 8.4–10.5)
Chloride: 104 meq/L (ref 96–112)
Creatinine, Ser: 0.7 mg/dL (ref 0.4–1.2)
GFR calc non Af Amer: 107.52 mL/min (ref >60)
Glucose, Bld: 91 mg/dL (ref 70–99)
Hgb A1c MFr Bld: 5.6 % (ref 4.6–6.5)
Potassium: 4.2 meq/L (ref 3.5–5.1)
Sodium: 140 meq/L (ref 135–145)

## 2009-08-29 ENCOUNTER — Telehealth: Payer: Self-pay | Admitting: Family Medicine

## 2009-10-18 ENCOUNTER — Encounter: Admission: RE | Admit: 2009-10-18 | Discharge: 2009-10-18 | Payer: Self-pay | Admitting: Obstetrics and Gynecology

## 2009-11-12 ENCOUNTER — Telehealth (INDEPENDENT_AMBULATORY_CARE_PROVIDER_SITE_OTHER): Payer: Self-pay | Admitting: *Deleted

## 2009-11-14 ENCOUNTER — Ambulatory Visit: Payer: Self-pay | Admitting: Family Medicine

## 2009-11-15 LAB — CONVERTED CEMR LAB
ALT: 24 units/L (ref 0–35)
AST: 22 units/L (ref 0–37)
Albumin: 3.8 g/dL (ref 3.5–5.2)
Alkaline Phosphatase: 59 units/L (ref 39–117)
BUN: 10 mg/dL (ref 6–23)
Basophils Absolute: 0 10*3/uL (ref 0.0–0.1)
Basophils Relative: 0.5 % (ref 0.0–3.0)
Bilirubin, Direct: 0.1 mg/dL (ref 0.0–0.3)
CO2: 26 meq/L (ref 19–32)
Calcium: 9 mg/dL (ref 8.4–10.5)
Chloride: 106 meq/L (ref 96–112)
Cholesterol: 134 mg/dL (ref 0–200)
Creatinine, Ser: 0.8 mg/dL (ref 0.4–1.2)
Eosinophils Absolute: 0.2 10*3/uL (ref 0.0–0.7)
Eosinophils Relative: 2.7 % (ref 0.0–5.0)
GFR calc non Af Amer: 102.55 mL/min (ref >60)
Glucose, Bld: 80 mg/dL (ref 70–99)
HCT: 38.6 % (ref 36.0–46.0)
HDL: 38.2 mg/dL — ABNORMAL LOW (ref >39.00)
Hemoglobin: 13.4 g/dL (ref 12.0–15.0)
Hgb A1c MFr Bld: 5.8 % (ref 4.6–6.5)
LDL Cholesterol: 77 mg/dL (ref 0–99)
Lymphocytes Relative: 49.8 % — ABNORMAL HIGH (ref 12.0–46.0)
Lymphs Abs: 2.8 10*3/uL (ref 0.7–4.0)
MCHC: 34.6 g/dL (ref 30.0–36.0)
MCV: 97.7 fL (ref 78.0–100.0)
Monocytes Absolute: 0.5 10*3/uL (ref 0.1–1.0)
Monocytes Relative: 8.1 % (ref 3.0–12.0)
Neutro Abs: 2.2 10*3/uL (ref 1.4–7.7)
Neutrophils Relative %: 38.9 % — ABNORMAL LOW (ref 43.0–77.0)
Platelets: 328 10*3/uL (ref 150.0–400.0)
Potassium: 4.2 meq/L (ref 3.5–5.1)
RBC: 3.96 M/uL (ref 3.87–5.11)
RDW: 13.8 % (ref 11.5–14.6)
Sodium: 141 meq/L (ref 135–145)
TSH: 1.12 microintl units/mL (ref 0.35–5.50)
Total Bilirubin: 0.5 mg/dL (ref 0.3–1.2)
Total CHOL/HDL Ratio: 4
Total Protein: 6.9 g/dL (ref 6.0–8.3)
Triglycerides: 94 mg/dL (ref 0.0–149.0)
VLDL: 18.8 mg/dL (ref 0.0–40.0)
WBC: 5.6 10*3/uL (ref 4.5–10.5)

## 2009-11-19 ENCOUNTER — Telehealth (INDEPENDENT_AMBULATORY_CARE_PROVIDER_SITE_OTHER): Payer: Self-pay | Admitting: *Deleted

## 2009-12-04 ENCOUNTER — Ambulatory Visit: Payer: Self-pay | Admitting: Pulmonary Disease

## 2009-12-04 ENCOUNTER — Telehealth (INDEPENDENT_AMBULATORY_CARE_PROVIDER_SITE_OTHER): Payer: Self-pay | Admitting: *Deleted

## 2009-12-07 ENCOUNTER — Telehealth (INDEPENDENT_AMBULATORY_CARE_PROVIDER_SITE_OTHER): Payer: Self-pay | Admitting: *Deleted

## 2009-12-26 ENCOUNTER — Ambulatory Visit: Payer: Self-pay | Admitting: Pulmonary Disease

## 2010-01-02 ENCOUNTER — Telehealth: Payer: Self-pay | Admitting: Gastroenterology

## 2010-01-02 ENCOUNTER — Telehealth (INDEPENDENT_AMBULATORY_CARE_PROVIDER_SITE_OTHER): Payer: Self-pay | Admitting: *Deleted

## 2010-01-11 ENCOUNTER — Other Ambulatory Visit: Payer: Self-pay | Admitting: Family Medicine

## 2010-01-11 ENCOUNTER — Encounter: Payer: Self-pay | Admitting: Family Medicine

## 2010-01-11 ENCOUNTER — Ambulatory Visit
Admission: RE | Admit: 2010-01-11 | Discharge: 2010-01-11 | Payer: Self-pay | Source: Home / Self Care | Attending: Family Medicine | Admitting: Family Medicine

## 2010-01-11 DIAGNOSIS — Z8679 Personal history of other diseases of the circulatory system: Secondary | ICD-10-CM | POA: Insufficient documentation

## 2010-01-11 LAB — TSH: TSH: 0.9 u[IU]/mL (ref 0.35–5.50)

## 2010-01-11 LAB — CBC WITH DIFFERENTIAL/PLATELET
Basophils Absolute: 0 10*3/uL (ref 0.0–0.1)
Basophils Relative: 0.4 % (ref 0.0–3.0)
Eosinophils Absolute: 0.1 10*3/uL (ref 0.0–0.7)
Eosinophils Relative: 1.8 % (ref 0.0–5.0)
HCT: 41 % (ref 36.0–46.0)
Hemoglobin: 13.9 g/dL (ref 12.0–15.0)
Lymphocytes Relative: 44.4 % (ref 12.0–46.0)
Lymphs Abs: 3.1 10*3/uL (ref 0.7–4.0)
MCHC: 33.9 g/dL (ref 30.0–36.0)
MCV: 98.7 fl (ref 78.0–100.0)
Monocytes Absolute: 0.6 10*3/uL (ref 0.1–1.0)
Monocytes Relative: 8.7 % (ref 3.0–12.0)
Neutro Abs: 3.2 10*3/uL (ref 1.4–7.7)
Neutrophils Relative %: 44.7 % (ref 43.0–77.0)
Platelets: 359 10*3/uL (ref 150.0–400.0)
RBC: 4.15 Mil/uL (ref 3.87–5.11)
RDW: 14 % (ref 11.5–14.6)
WBC: 7.1 10*3/uL (ref 4.5–10.5)

## 2010-01-11 LAB — BASIC METABOLIC PANEL
BUN: 11 mg/dL (ref 6–23)
CO2: 29 mEq/L (ref 19–32)
Calcium: 9.3 mg/dL (ref 8.4–10.5)
Chloride: 106 mEq/L (ref 96–112)
Creatinine, Ser: 0.8 mg/dL (ref 0.4–1.2)
GFR: 92.58 mL/min (ref >60.00)
Glucose, Bld: 81 mg/dL (ref 70–99)
Potassium: 4.7 mEq/L (ref 3.5–5.1)
Sodium: 144 mEq/L (ref 135–145)

## 2010-01-11 LAB — HEPATIC FUNCTION PANEL
ALT: 26 U/L (ref 0–35)
AST: 22 U/L (ref 0–37)
Albumin: 3.7 g/dL (ref 3.5–5.2)
Alkaline Phosphatase: 58 U/L (ref 39–117)
Bilirubin, Direct: 0.1 mg/dL (ref 0.0–0.3)
Total Bilirubin: 0.6 mg/dL (ref 0.3–1.2)
Total Protein: 6.7 g/dL (ref 6.0–8.3)

## 2010-01-11 LAB — LIPID PANEL
Cholesterol: 117 mg/dL (ref 0–200)
HDL: 39.7 mg/dL (ref >39.00)
LDL Cholesterol: 56 mg/dL (ref 0–99)
Total CHOL/HDL Ratio: 3
Triglycerides: 107 mg/dL (ref 0.0–149.0)
VLDL: 21.4 mg/dL (ref 0.0–40.0)

## 2010-01-25 ENCOUNTER — Ambulatory Visit
Admission: RE | Admit: 2010-01-25 | Discharge: 2010-01-25 | Payer: Self-pay | Source: Home / Self Care | Attending: Pulmonary Disease | Admitting: Pulmonary Disease

## 2010-01-25 DIAGNOSIS — J209 Acute bronchitis, unspecified: Secondary | ICD-10-CM | POA: Insufficient documentation

## 2010-01-27 ENCOUNTER — Encounter: Payer: Self-pay | Admitting: Obstetrics and Gynecology

## 2010-01-28 ENCOUNTER — Encounter: Payer: Self-pay | Admitting: Family Medicine

## 2010-01-28 ENCOUNTER — Ambulatory Visit
Admission: RE | Admit: 2010-01-28 | Discharge: 2010-01-28 | Payer: Self-pay | Source: Home / Self Care | Attending: Family Medicine | Admitting: Family Medicine

## 2010-01-30 ENCOUNTER — Encounter: Payer: Self-pay | Admitting: Pulmonary Disease

## 2010-01-30 ENCOUNTER — Ambulatory Visit
Admission: RE | Admit: 2010-01-30 | Discharge: 2010-01-30 | Payer: Self-pay | Source: Home / Self Care | Attending: Pulmonary Disease | Admitting: Pulmonary Disease

## 2010-01-30 LAB — CONVERTED CEMR LAB: IgE (Immunoglobulin E), Serum: 19.2 intl units/mL (ref 0.0–180.0)

## 2010-02-01 ENCOUNTER — Telehealth (INDEPENDENT_AMBULATORY_CARE_PROVIDER_SITE_OTHER): Payer: Self-pay | Admitting: *Deleted

## 2010-02-05 NOTE — Letter (Signed)
Summary: Unable To Reach-Consult Scheduled  [facility] [place]  [address] [place], [place] [address]   Phone: [phone]  Fax: [phone]    08/11/2006 MRN: [id number]    Dear Ms. Grissett,   We have been unable to reach you by phone.  Please contact our office with an updated phone number.  At the recommendation of Dr.Lowne, we have been asked to schedule you a consult with [facility] for US THYROID.  Their phone number is [phone]. They have been trying to reach you concerning scheduling your appointment, call the number of the office you are being referred to listed above and let them know when you will be available.  If you have any question please call us.     Thank you,  Ebony Cargo at [facility]

## 2010-02-05 NOTE — Procedures (Addendum)
Summary: EGD  Gastroenterology EGD   Imported By: Lowry Ram CMA 03/04/2007 12:32:27  _____________________________________________________________________  External Attachment:    Type:   Image     Comment:   External Document

## 2010-02-05 NOTE — Progress Notes (Signed)
Summary: awaiting return call  Phone Note Call from Patient Call back at Work Phone [phone] Call back at [phone] cell   Summary of Call: pt left message on machine that she would like samples of her most recent BP med until she get her rx..........................Marland KitchenFelecia Deloach CMA  July 17, 2008 2:59 PM Initial call taken by: Jeremy Johann CMA,  July 17, 2008 2:58 PM  Follow-up for Phone Call        left message to call  office.Felecia Deloach CMA  July 17, 2008 3:00 PM  Additional Follow-up for Phone Call Additional follow up Details #1::        spoke to pt husband pt has been out of med since the weekend and pt is currently being admitted to hosp. informed pt husband rx has been sent to local pharmacy and samples have been placed up front for pt as well.Felecia Deloach CMA  July 17, 2008 3:09 PM    Prescriptions: EXFORGE HCT 5-160-12.5 MG TABS (AMLODIPINE-VALSARTAN-HCTZ) 1 by mouth once daily  #30 x 3   Entered by:   Jeremy Johann CMA   Authorized by:   Loreen Freud DO   Signed by:   Jeremy Johann CMA on 07/17/2008   Method used:   Faxed to ...       [facility] [address] [phone]* (retail)       [address]       [place], [place]  [address]       Ph: [phone] or [phone]       Fax: [phone]   RxID:   [id number]

## 2010-02-05 NOTE — Assessment & Plan Note (Signed)
Summary: 2 WEEKS/ [facility]W   Visit Type:  Follow-up Copy to:  Dr. Loreen Freud Primary Provider/Referring Provider:  Loreen Freud DO  CC:  Pt here for follow up. Pt states is "100%" better. Pt does c/o clearing throat, some wheezing, and and productive cough when hot.  History of Present Illness: 53/F, never smoker referred for evalaution of cough, wheezing & dyspnea since Oct 2010. This started with a head & chest cold in october, went away , then returned. On 12/04/08 OV, she received a steroid shot, MDI & avelox & symbicort were given On 1/18 OV, received prednisone. She was seen by Dr Willa Rough (allergist). She required an ER visit 12/12/08 & again on 02/05/09 >> d-dimer neg, prednisone 30 mg , cheratussin , told she had bronchitis. CXR 1/19 no infx or effusion. Prednisone has caused weight gain.  History does not indicate any environmental trigger. Pro- air causes palpitations  Initial impresion was :Asthmatic bronchitis vs reactive airway disease - no clear trigger Take advair 500/50 sample two times a day instead of symbicort Take singulair once daily x 2 weeks Cheratussin at bedtime   February 27, 2009 2:12 PM  Much improved . "100%" better. Pt does c/o clearing throat, some wheezing, and and productive cough when hot. She denies sputum production, chest pain, orthopnea or pND.  Current Medications (verified): 1)  Calcium .[name].. 1 Tablet By Mouth Once Daily 2)  Multivitamins   Tabs (Multiple Vitamin) .... Take 1 Tablet By Mouth Once A Day 3)  Flax Seed Oil 1000 Mg Caps (Flaxseed (Linseed)) .... Take 1 Capsule By Mouth Once A Day 4)  Exforge Hct 5-160-12.5 Mg Tabs (Amlodipine-Valsartan-Hctz) .[name].. 1 By Mouth Once Daily 5)  Premarin 0.3 Mg Tabs (Estrogens Conjugated) .[name].. 1 By Mouth Once Daily 6)  Astepro 0.15 % Soln (Azelastine Hcl) .... 2 Sprays Each Nostril Once Daily As Needed 7)  Veramyst 27.5 Mcg/spray Susp (Fluticasone Furoate) .... 2 Spray Each Nostril Once Daily As Needed 8)   Pepcid 20 Mg Tabs (Famotidine) .... Take 1 Tablet By Mouth Once A Day 9)  Singulair 10 Mg Tabs (Montelukast Sodium) .... Take 1 Tablet By Mouth Once A Day  Allergies (verified): 1)  ! Adhesive Bandages Fexible (Adhesive Bandages)  Past History:  Past Medical History: Last updated: 01/23/2009 Anemia-NOS Atrial fibrillation: PAF initially documented in 2005 and again in 07/2006 Headache Hypertension GERD Gastritis fatty Liver Diverticulosis Internal Hemorroids depression Sleep apnea COLON POLYPS Asthma  Social History: Last updated: 02/13/2009 Married Never Smoked Alcohol use-no Occupation: [employer] Drug use-no Regular exercise-no 2 children  Review of Systems  The patient denies anorexia, fever, weight loss, weight gain, vision loss, decreased hearing, hoarseness, chest pain, syncope, dyspnea on exertion, peripheral edema, prolonged cough, headaches, hemoptysis, abdominal pain, melena, hematochezia, severe indigestion/heartburn, hematuria, muscle weakness, suspicious skin lesions, difficulty walking, depression, unusual weight change, and abnormal bleeding.    Vital Signs:  Patient profile:   55 year old female Height:      65 inches Weight:      232.50 pounds O2 Sat:      97 % on Room air Temp:     98.2 degrees F oral Pulse rate:   83 / minute BP sitting:   116 / 70  (left arm) Cuff size:   large  Vitals Entered By: Zackery Barefoot CMA (February 27, 2009 2:06 PM)  O2 Flow:  Room air CC: Pt here for follow up. Pt states is "100%" better. Pt does c/o clearing throat, some  wheezing, and productive cough when hot Comments Medications reviewed with patient Verified contact number and pharmacy with patient Zackery Barefoot CMA  February 27, 2009 2:08 PM    Physical Exam  Additional Exam:  Gen. Pleasant, well-nourished, in no distress, normal affect ENT - no lesions, no post nasal drip Neck: No JVD, no thyromegaly, no carotid bruits Lungs: no use of accessory  muscles, no dullness to percussion, clear without rales or rhonchi  Cardiovascular: Rhythm regular, heart sounds  normal, no murmurs or gallops, no peripheral edema Musculoskeletal: No deformities, no cyanosis or clubbing      Impression & Recommendations:  Problem # 1:  ASTHMA (ICD-493.90) Stay on adavir & singulair Discussed plan If wheezing or nasal symptoms start again Will step down if remains asymptomatic x 3 months. Stay on zyrtec for allergic diathesis.  Medications Added to Medication List This Visit: 1)  Multivitamins Tabs (Multiple vitamin) .... Take 1 tablet by mouth once a day 2)  Flax Seed Oil 1000 Mg Caps (Flaxseed (linseed)) .... Take 1 capsule by mouth once a day 3)  Pepcid 20 Mg Tabs (Famotidine) .... Take 1 tablet by mouth once a day 4)  Singulair 10 Mg Tabs (Montelukast sodium) .... Take 1 tablet by mouth once a day 5)  Advair Diskus 500-50 Mcg/dose Aepb (Fluticasone-salmeterol) .... Two times a day 6)  Zyrtec Allergy 10 Mg Tabs (Cetirizine hcl) .... Once daily  Other Orders: Prescription Created Electronically [id number]) Est. Patient Level III ([address])  Patient Instructions: 1)  Please schedule a follow-up appointment in 2 months. 2)  Stay on adavir & singulair 3)  If wheezing or nasal symptoms start again, call us Prescriptions: ZYRTEC ALLERGY 10 MG TABS (CETIRIZINE HCL) once daily  #30 x 2   Entered and Authorized by:   Comer Locket Vassie Loll MD   Signed by:   Comer Locket Vassie Loll MD on 02/27/2009   Method used:   Electronically to        [facility]. [address] [phone]* (retail)       [address]       [place], [place]  [address]       Ph: [phone] or [phone]       Fax: [phone]   RxID:   [id number] SINGULAIR 10 MG TABS (MONTELUKAST SODIUM) Take 1 tablet by mouth once a day  #30 x 2   Entered and Authorized by:   Comer Locket. Vassie Loll MD   Signed by:   Comer Locket Vassie Loll MD on 02/27/2009   Method used:   Electronically to        [facility].  [address] [phone]* (retail)       [address]       [place], [place]  [address]       Ph: [phone] or [phone]       Fax: [phone]   RxID:   [id number] ADVAIR DISKUS 500-50 MCG/DOSE AEPB (FLUTICASONE-SALMETEROL) two times a day  #1 x 2   Entered and Authorized by:   Comer Locket. Vassie Loll MD   Signed by:   Comer Locket Vassie Loll MD on 02/27/2009   Method used:   Electronically to        [facility]. [address] [phone]* (retail)       [address]       [place], [place]  [address]       Ph: [phone] or [phone]       Fax: [phone]   RxID:   [id number]

## 2010-02-05 NOTE — Assessment & Plan Note (Signed)
Summary: 2 WKS OV//PH   Vital Signs:  Patient profile:   55 year old female Weight:      224 pounds Temp:     98.5 degrees F oral BP sitting:   122 / 76  (left arm)  Vitals Entered By: Almeta Monas CMA Duncan Dull) (August 21, 2009 4:24 PM) CC: 2 week F/U also requested to be checked for Diabetes Comments pt has not started her new BP meds, per pt meds were increased at her last visit but pharmacy will not fill until previous dosage is complete. Almeta Monas CMA Duncan Dull)  August 21, 2009 4:27 PM   History of Present Illness:  Hypertension follow-up      This is a 55 year old woman who presents for Hypertension follow-up.  The patient denies lightheadedness, urinary frequency, headaches, edema, impotence, rash, and fatigue.  The patient denies the following associated symptoms: chest pain, chest pressure, exercise intolerance, dyspnea, palpitations, syncope, leg edema, and pedal edema.  Compliance with medications (by patient report) has been near 100%.  The patient reports that dietary compliance has been good.  The patient reports exercising occasionally.  Adjunctive measures currently used by the patient include salt restriction.  Back pain has improved.  Pt would also like to check BS-- secondary to fam hx diabetes.  Current Medications (verified): 1)  Calcium Carbonate-Vitamin D 600-400 Mg-Unit  Tabs (Calcium Carbonate-Vitamin D) .... Take 1 Tablet By Mouth Once A Day 2)  Multivitamins   Tabs (Multiple Vitamin) .... Take 1 Tablet By Mouth Once A Day 3)  Exforge Hct 5-160-12.5 Mg Tabs (Amlodipine-Valsartan-Hctz) .[name].. 1 By Mouth Once Daily 4)  Premarin 0.3 Mg Tabs (Estrogens Conjugated) .[name].. 1 By Mouth Once Daily 5)  Fluticasone Propionate 50 Mcg/act Susp (Fluticasone Propionate) .... 2 Puffs  Two Times A Day 6)  Zyrtec Allergy 10 Mg Caps (Cetirizine Hcl) .[name].. 1 By Mouth At Bedtime 7)  Vicodin Es 7.5-750 Mg Tabs (Hydrocodone-Acetaminophen) .[name].. 1 By Mouth Every 6 Hours As Needed 8)   Flexeril 10 Mg Tabs (Cyclobenzaprine Hcl) .[name].. 1 By Mouth Three Times A Day As Needed  Allergies (verified): 1)  ! Adhesive Bandages Fexible (Adhesive Bandages)  Past History:  Past medical, surgical, family and social histories (including risk factors) reviewed for relevance to current acute and chronic problems.  Past Medical History: Reviewed history from 01/23/2009 and no changes required. Anemia-NOS Atrial fibrillation: PAF initially documented in 2005 and again in 07/2006 Headache Hypertension GERD Gastritis fatty Liver Diverticulosis Internal Hemorroids depression Sleep apnea COLON POLYPS Asthma  Past Surgical History: Reviewed history from 04/21/2008 and no changes required. Hysterectomy--partial Appendectomy Colonoscopy LAST DONE 5/09 Breast reduction TAH-->BSO cervical fusion  Family History: Reviewed history from 01/28/2008 and no changes required. Family History Diabetes 1st degree relative Family History of CAD Female 1st degree relative <60  Social History: Reviewed history from 02/13/2009 and no changes required. Married Never Smoked Alcohol use-no Occupation: [employer] Drug use-no Regular exercise-no 2 children  Review of Systems      See HPI  Physical Exam  General:  Well-developed,well-nourished,in no acute distress; alert,appropriate and cooperative throughout examination Lungs:  Normal respiratory effort, chest expands symmetrically. Lungs are clear to auscultation, no crackles or wheezes. Heart:  Normal rate and regular rhythm. S1 and S2 normal without gallop, murmur, click, rub or other extra sounds. Extremities:  No clubbing, cyanosis, edema, or deformity noted with normal full range of motion of all joints.   Skin:  Intact without suspicious lesions or rashes Cervical  Nodes:  No lymphadenopathy noted Psych:  Cognition and judgment appear intact. Alert and cooperative with normal attention span and concentration. No apparent delusions,  illusions, hallucinations   Impression & Recommendations:  Problem # 1:  HYPERTENSION (ICD-401.9)  Her updated medication list for this problem includes:    Exforge Hct 5-160-12.5 Mg Tabs (Amlodipine-valsartan-hctz) .[name]... 1 by mouth once daily  BP today: 122/76 Prior BP: 130/84 (08/06/2009)  Labs Reviewed: K+: 4.1 (03/09/2009) Creat: : 0.80 (03/09/2009)   Chol: 135 (12/13/2007)   HDL: 39.4 (12/13/2007)   LDL: 73 (12/13/2007)   TG: 111 (12/13/2007)  Problem # 2:  BACK PAIN, LUMBAR (ICD-724.2) Assessment: Improved  Her updated medication list for this problem includes:    Vicodin Es 7.5-750 Mg Tabs (Hydrocodone-acetaminophen) .[name]... 1 by mouth every 6 hours as needed    Flexeril 10 Mg Tabs (Cyclobenzaprine hcl) .[name]... 1 by mouth three times a day as needed  Discussed use of moist heat or ice, modified activities, medications, and stretching/strengthening exercises. Back care instructions given. To be seen in 2 weeks if no improvement; sooner if worsening of symptoms.   Problem # 3:  FAMILY HISTORY DIABETES 1ST DEGREE RELATIVE (ICD-V18.0)  Orders: Venipuncture ([address]) TLB-BMP (Basic Metabolic Panel-BMET) (80048-METABOL) TLB-A1C / Hgb A1C (Glycohemoglobin) (83036-A1C)  Complete Medication List: 1)  Calcium Carbonate-vitamin D 600-400 Mg-unit Tabs (Calcium carbonate-vitamin d) .... Take 1 tablet by mouth once a day 2)  Multivitamins Tabs (Multiple vitamin) .... Take 1 tablet by mouth once a day 3)  Exforge Hct 5-160-12.5 Mg Tabs (Amlodipine-valsartan-hctz) .[name].. 1 by mouth once daily 4)  Premarin 0.3 Mg Tabs (Estrogens conjugated) .[name].. 1 by mouth once daily 5)  Fluticasone Propionate 50 Mcg/act Susp (Fluticasone propionate) .... 2 puffs  two times a day 6)  Zyrtec Allergy 10 Mg Caps (Cetirizine hcl) .[name].. 1 by mouth at bedtime 7)  Vicodin Es 7.5-750 Mg Tabs (Hydrocodone-acetaminophen) .[name].. 1 by mouth every 6 hours as needed 8)  Flexeril 10 Mg Tabs (Cyclobenzaprine hcl) .[name].. 1 by mouth  three times a day as needed  Patient Instructions: 1)  Please schedule a follow-up appointment in 3 months--  come fasting at that time and we will get labs 2)  You need to lose weight. Consider a lower calorie diet and regular exercise.  3)  Limit your Sodium(salt) .  Prescriptions: EXFORGE HCT 5-160-12.5 MG TABS (AMLODIPINE-VALSARTAN-HCTZ) 1 by mouth once daily  #90 x 3   Entered and Authorized by:   Loreen Freud DO   Signed by:   Loreen Freud DO on 08/21/2009   Method used:   Electronically to        [facility]. [address] [phone]* (retail)       [address]       [place], [place]  [address]       Ph: [phone] or [phone]       Fax: [phone]   RxID:   [id number]   Appended Document: 2 WKS OV//PH

## 2010-02-05 NOTE — Assessment & Plan Note (Signed)
Summary: cough//fd   Vital Signs:  Patient profile:   55 year old female Weight:      228 pounds Temp:     98.0 degrees F oral Pulse rate:   88 / minute Pulse rhythm:   regular BP sitting:   130 / 80  (left arm) Cuff size:   large  Vitals Entered By: Army Fossa CMA (January 23, 2009 2:29 PM) CC: Pt c/o cough since october- states now it taste like she has metal in her mouth. , Cough   History of Present Illness:  Cough      This is a 55 year old woman who presents with Cough.  The symptoms began 4-6 months ago.  The patient reports productive cough and wheezing, but denies non-productive cough, pleuritic chest pain, shortness of breath, exertional dyspnea, fever, hemoptysis, and malaise.  The patient denies the following symptoms: cold/URI symptoms, sore throat, nasal congestion, chronic rhinitis, weight loss, acid reflux symptoms, and peripheral edema.  The cough is worse with exercise and activity.  Ineffective prior treatments have included OTC cough medication, albuterol inhaler, and other asthma medication.  Risk factors include history of asthma.  Diagnostic testing to date has included sinus CT scan.    Asthma History    Initial Asthma Severity Rating:    Age range: 12+ years    Symptoms: throughout the day    Nighttime Awakenings: often 7/week    Interferes w/ normal activity: some limitations    SABA use (not for EIB): several times per day    Exacerbations requiring oral systemic steroids: 2 or more/year    Asthma Severity Assessment: Severe Persistent   Current Medications (verified): 1)  Calcium .[name].. 1 Tablet By Mouth Once Daily 2)  Multivitamin .[name].. 1 Tablet By Mouth Once Daily 3)  Flaxseed Oil .[name].. 1 Tablet By Mouth Once Daily 4)  Exforge Hct 5-160-12.5 Mg Tabs (Amlodipine-Valsartan-Hctz) .[name].. 1 By Mouth Once Daily 5)  Premarin 0.3 Mg Tabs (Estrogens Conjugated) .[name].. 1 By Mouth Once Daily 6)  Symbicort 160-4.5 Mcg/act Aero (Budesonide-Formoterol Fumarate)  .... 2 Puffs Two Times A Day 7)  Astepro 0.15 % Soln (Azelastine Hcl) .... 2 Sprays Each Nostril Once Daily 8)  Veramyst 27.5 Mcg/spray Susp (Fluticasone Furoate) .... 2 Spray Each Nostril Once Daily 9)  Proair Hfa 108 (90 Base) Mcg/act Aers (Albuterol Sulfate) .... 2 Puffs Qid As Needed 10)  Prednisone 10 Mg Tabs (Prednisone) .... 3 By Mouth Once Daily For 3 Days Then 2 By Mouth Once Daily For 3 Days Then 1 By Mouth Once Daily For 3 Days  Allergies: 1)  ! Adhesive Bandages Fexible (Adhesive Bandages)  Past History:  Past medical, surgical, family and social histories (including risk factors) reviewed for relevance to current acute and chronic problems.  Past Medical History: Anemia-NOS Atrial fibrillation: PAF initially documented in 2005 and again in 07/2006 Headache Hypertension GERD Gastritis fatty Liver Diverticulosis Internal Hemorroids depression Sleep apnea COLON POLYPS Asthma  Past Surgical History: Reviewed history from 04/21/2008 and no changes required. Hysterectomy--partial Appendectomy Colonoscopy LAST DONE 5/09 Breast reduction TAH-->BSO cervical fusion  Family History: Reviewed history from 01/28/2008 and no changes required. Family History Diabetes 1st degree relative Family History of CAD Female 1st degree relative <60  Social History: Reviewed history from 01/28/2008 and no changes required. Married Never Smoked Alcohol use-no Occupation: [employer]. Drug use-no Regular exercise-no  Review of Systems      See HPI  Physical Exam  General:  Well-developed,well-nourished,in no acute distress;  alert,appropriate and cooperative throughout examination Ears:  External ear exam shows no significant lesions or deformities.  Otoscopic examination reveals clear canals, tympanic membranes are intact bilaterally without bulging, retraction, inflammation or discharge. Hearing is grossly normal bilaterally. Nose:  External nasal examination shows no deformity  or inflammation. Nasal mucosa are pink and moist without lesions or exudates. Mouth:  Oral mucosa and oropharynx without lesions or exudates.  Teeth in good repair. Neck:  No deformities, masses, or tenderness noted. Lungs:  R wheezes and L wheezes.   Heart:  Normal rate and regular rhythm. S1 and S2 normal without gallop, murmur, click, rub or other extra sounds. Extremities:  No clubbing, cyanosis, edema, or deformity noted with normal full range of motion of all joints.   Psych:  Oriented X3 and normally interactive.     Impression & Recommendations:  Problem # 1:  ASTHMA (ICD-493.90)  The following medications were removed from the medication list:    Symbicort 160-4.5 Mcg/act Aero (Budesonide-formoterol fumarate) .[name]... 2 puffs two times a day Her updated medication list for this problem includes:    Symbicort 160-4.5 Mcg/act Aero (Budesonide-formoterol fumarate) .[name]... 2 puffs two times a day    Proair Hfa 108 (90 Base) Mcg/act Aers (Albuterol sulfate) .[name]... 2 puffs qid as needed    Prednisone 10 Mg Tabs (Prednisone) .Marland KitchenMarland KitchenMarland KitchenMarland Kitchen 3 by mouth once daily for 3 days then 2 by mouth once daily for 3 days then 1 by mouth once daily for 3 days  Orders: Admin of Therapeutic Inj  intramuscular or subcutaneous ([address]) Depo- Medrol 80mg  (J1040) T-2 View CXR (71020TC)  Complete Medication List: 1)  Calcium  .[name].. 1 tablet by mouth once daily 2)  Multivitamin  .[name].. 1 tablet by mouth once daily 3)  Flaxseed Oil  .[name].. 1 tablet by mouth once daily 4)  Exforge Hct 5-160-12.5 Mg Tabs (Amlodipine-valsartan-hctz) .[name].. 1 by mouth once daily 5)  Premarin 0.3 Mg Tabs (Estrogens conjugated) .[name].. 1 by mouth once daily 6)  Symbicort 160-4.5 Mcg/act Aero (Budesonide-formoterol fumarate) .... 2 puffs two times a day 7)  Astepro 0.15 % Soln (Azelastine hcl) .... 2 sprays each nostril once daily 8)  Veramyst 27.5 Mcg/spray Susp (Fluticasone furoate) .... 2 spray each nostril once daily 9)  Proair Hfa 108 (90 Base)  Mcg/act Aers (Albuterol sulfate) .... 2 puffs qid as needed 10)  Prednisone 10 Mg Tabs (Prednisone) .... 3 by mouth once daily for 3 days then 2 by mouth once daily for 3 days then 1 by mouth once daily for 3 days Prescriptions: PREDNISONE 10 MG TABS (PREDNISONE) 3 by mouth once daily for 3 days then 2 by mouth once daily for 3 days then 1 by mouth once daily for 3 days  #18 x 0   Entered and Authorized by:   Loreen Freud DO   Signed by:   Loreen Freud DO on 01/23/2009   Method used:   Electronically to        [facility]. [address] [phone]* (retail)       [address]       [place], [place]  [address]       Ph: [phone] or [phone]       Fax: [phone]   RxID:   [id number] SYMBICORT 160-4.5 MCG/ACT AERO (BUDESONIDE-FORMOTEROL FUMARATE) 2 puffs two times a day  #1 x 5   Entered and Authorized by:   Loreen Freud DO   Signed by:  Loreen Freud DO on 01/23/2009   Method used:   Print then Give to Patient   RxID:   [id number]    Medication Administration  Injection # 1:    Medication: Depo- Medrol 80mg     Diagnosis: ASTHMA (ICD-493.90)    Route: IM    Site: RUOQ gluteus    Exp Date: 10/2009    Lot #: [id number]    Mfr: [email or web address]    Patient tolerated injection without complications    Given by: Army Fossa CMA (January 23, 2009 3:07 PM)  Orders Added: 1)  Admin of Therapeutic Inj  intramuscular or subcutaneous [96372] 2)  Depo- Medrol 80mg  [J1040] 3)  T-2 View CXR [71020TC] 4)  Est. Patient Level IV [[address]]

## 2010-02-05 NOTE — Assessment & Plan Note (Signed)
Summary: Acute NP follow up - bronchitis, asthma   Copy to:  Dr. Loreen Freud Primary Provider/Referring Provider:  Loreen Freud DO  CC:  prod cough with yellow mucus, wheezing x3days, wonders if allergies may be the culprit and would like to discuss a referral - denies dyspnea, and f/c/s.  History of Present Illness: 55/F, never smoker referred for evalaution of cough, wheezing & dyspnea since Oct 2010. This started with a head & chest cold in october, went away , then returned. On 12/04/08 OV, she received a steroid shot, MDI & avelox & symbicort were given On 1/18 OV, received prednisone. She was seen by Dr Willa Rough (allergist). She required an ER visit 12/12/08 & again on 02/05/09 >> d-dimer neg, prednisone 30 mg , cheratussin , told she had bronchitis. CXR 1/19 no infx or effusion. Prednisone has caused weight gain.  History does not indicate any environmental trigger. Pro- air causes palpitations  Initial impresion was :Asthmatic bronchitis vs reactive airway disease - no clear trigger Take advair 500/50 sample two times a day instead of symbicort Take singulair once daily x 2 weeks Cheratussin at bedtime   February 27, 2009 2:12 PM  Much improved . "100%" better. Pt does c/o clearing throat, some wheezing, and and productive cough when hot. She denies sputum production, chest pain, orthopnea or pND.  April 02, 2009--Presents for an acute office visit Complains of  prod cough with yellow mucus, wheezing x3days. Wonders if allergies may be the culprit and would like to discuss a referral - She is not taking any meds for prevention. Says the pollen, flowers make her symptoms worse.  Could not afford Advair or Singulair. Denies chest pain, dyspnea, orthopnea, hemoptysis, fever, n/v/d, edema, headache.   Medications Prior to Update: 1)  Calcium .[name].. 1 Tablet By Mouth Once Daily 2)  Multivitamins   Tabs (Multiple Vitamin) .... Take 1 Tablet By Mouth Once A Day 3)  Exforge Hct 5-160-12.5 Mg  Tabs (Amlodipine-Valsartan-Hctz) .[name].. 1 By Mouth Once Daily 4)  Premarin 0.3 Mg Tabs (Estrogens Conjugated) .[name].. 1 By Mouth Once Daily  Current Medications (verified): 1)  Calcium Carbonate-Vitamin D 600-400 Mg-Unit  Tabs (Calcium Carbonate-Vitamin D) .... Take 1 Tablet By Mouth Once A Day 2)  Multivitamins   Tabs (Multiple Vitamin) .... Take 1 Tablet By Mouth Once A Day 3)  Exforge Hct 5-160-12.5 Mg Tabs (Amlodipine-Valsartan-Hctz) .[name].. 1 By Mouth Once Daily 4)  Premarin 0.3 Mg Tabs (Estrogens Conjugated) .[name].. 1 By Mouth Once Daily  Allergies (verified): 1)  ! Adhesive Bandages Fexible (Adhesive Bandages)  Past History:  Past Medical History: Last updated: 01/23/2009 Anemia-NOS Atrial fibrillation: PAF initially documented in 2005 and again in 07/2006 Headache Hypertension GERD Gastritis fatty Liver Diverticulosis Internal Hemorroids depression Sleep apnea COLON POLYPS Asthma  Past Surgical History: Last updated: 04/21/2008 Hysterectomy--partial Appendectomy Colonoscopy LAST DONE 5/09 Breast reduction TAH-->BSO cervical fusion  Family History: Last updated: 01/28/2008 Family History Diabetes 1st degree relative Family History of CAD Female 1st degree relative <60  Social History: Last updated: 02/13/2009 Married Never Smoked Alcohol use-no Occupation: [employer] Drug use-no Regular exercise-no 2 children  Risk Factors: Alcohol Use: 0 (03/09/2009) Caffeine Use: 0 (01/28/2008) Exercise: no (03/09/2009)  Risk Factors: Smoking Status: never (03/09/2009) Passive Smoke Exposure: no (01/28/2008)  Review of Systems      See HPI  Vital Signs:  Patient profile:   55 year old female Height:      65 inches Weight:      229.38 pounds  BMI:     38.31 O2 Sat:      95 % on Room air Temp:     97.3 degrees F oral Pulse rate:   74 / minute BP sitting:   150 / 78  (right arm) Cuff size:   large  Vitals Entered By: Boone Master CNA (April 02, 2009 10:47  AM)  O2 Flow:  Room air CC: prod cough with yellow mucus, wheezing x3days, wonders if allergies may be the culprit and would like to discuss a referral - denies dyspnea, f/c/s Is Patient Diabetic? No Comments Medications reviewed with patient Daytime contact number verified with patient. Boone Master CNA  April 02, 2009 10:48 AM    Physical Exam  Additional Exam:  Gen. Pleasant, well-nourished, in no distress, normal affect ENT - no lesions, pale nasal mucosa.  Neck: No JVD, no thyromegaly, no carotid bruits Lungs: no use of accessory muscles, no dullness to percussion, clear without rales or rhonchi  Cardiovascular: Rhythm regular, heart sounds  normal, no murmurs or gallops, no peripheral edema Musculoskeletal: No deformities, no cyanosis or clubbing      Impression & Recommendations:  Problem # 1:  ALLERGIC RHINITIS (ICD-477.9)  Flare . We discussed affordable options for prevention REC:  Saline nasal rinses two times a day Lloyd Huger Med Sinus rinse) Begin Veramyst 1 puff two times a day -when done begin generic Fluticasone 2 puffs two times a day ( I sent this prescription to pharmacy. ) Begin Zyrtec (generic brand is fine) at bedtime  May use Mucinex DM two times a day as needed cough/congesiton  Increase fluids, ie-water follow up 2 weeks and as needed  Please contact office for sooner follow up if symptoms do not improve or worsen  Her updated medication list for this problem includes:    Fluticasone Propionate 50 Mcg/act Susp (Fluticasone propionate) .[name]... 2 puffs  two times a day    Zyrtec Allergy 10 Mg Caps (Cetirizine hcl) .[name]... 1 by mouth at bedtime  Orders: Est. Patient Level III ([address])  Medications Added to Medication List This Visit: 1)  Calcium Carbonate-vitamin D 600-400 Mg-unit Tabs (Calcium carbonate-vitamin d) .... Take 1 tablet by mouth once a day 2)  Fluticasone Propionate 50 Mcg/act Susp (Fluticasone propionate) .... 2 puffs  two times a day 3)   Zyrtec Allergy 10 Mg Caps (Cetirizine hcl) .[name].. 1 by mouth at bedtime  Complete Medication List: 1)  Calcium Carbonate-vitamin D 600-400 Mg-unit Tabs (Calcium carbonate-vitamin d) .... Take 1 tablet by mouth once a day 2)  Multivitamins Tabs (Multiple vitamin) .... Take 1 tablet by mouth once a day 3)  Exforge Hct 5-160-12.5 Mg Tabs (Amlodipine-valsartan-hctz) .[name].. 1 by mouth once daily 4)  Premarin 0.3 Mg Tabs (Estrogens conjugated) .[name].. 1 by mouth once daily 5)  Fluticasone Propionate 50 Mcg/act Susp (Fluticasone propionate) .... 2 puffs  two times a day 6)  Zyrtec Allergy 10 Mg Caps (Cetirizine hcl) .[name].. 1 by mouth at bedtime  Patient Instructions: 1)  Saline nasal rinses two times a day Lloyd Huger Med Sinus rinse) 2)  Begin Veramyst 1 puff two times a day -when done begin generic Fluticasone 2 puffs two times a day ( I sent this prescription to pharmacy. ) 3)  Begin Zyrtec (generic brand is fine) at bedtime  4)  May use Mucinex DM two times a day as needed cough/congesiton  5)  Increase fluids, ie-water 6)  follow up 2 weeks and as needed  7)  Please contact office for  sooner follow up if symptoms do not improve or worsen  Prescriptions: FLUTICASONE PROPIONATE 50 MCG/ACT SUSP (FLUTICASONE PROPIONATE) 2 puffs  two times a day  #1 x 5   Entered and Authorized by:   Rubye Oaks NP   Signed by:   Altamese Deguire NP on 04/02/2009   Method used:   Electronically to        [facility]. [address]. [phone]* (retail)       [address]       [place], [place]  [address]       Ph: [phone] or [phone]       Fax: [phone]   RxID:   [id number]

## 2010-02-05 NOTE — Assessment & Plan Note (Signed)
Summary: rto 3 months.cbs   Vital Signs:  Patient profile:   55 year old female Weight:      228.0 pounds Temp:     98.1 degrees F oral Pulse rate:   74 / minute Pulse rhythm:   regular BP sitting:   140 / 88  (left arm) Cuff size:   large  Vitals Entered By: Almeta Monas CMA Duncan Dull) (November 14, 2009 11:11 AM) CC: 3 mo f/u-- wants DM check up and c/o rash on legs and arms-- Recently changed detergents, Rash   History of Present Illness:  Rash      This is a 55 year old woman who presents with Rash.  The symptoms began 3 weeks ago.  The patient complains of papules, but denies macules, nodules, hives, welts, pustules, blisters, ulcers, itching, scaling, weeping, oozing, redness, increased warmth, and tenderness.  The rash is located on the right arm, left arm, right leg, and left leg.  The rash is worse with heat and better with cold.  The patient denies the following symptoms: fever, headache, facial swelling, tongue swelling, burning, difficulty breathing, abdominal pain, nausea, vomiting, diarrhea, dizziness, sore throat, dysuria, eye symptoms, arthralgias, and vaginal discharge.  The patient reports a history of new topical exposure.  The patient denies history of recent tick bite, recent tick exposure, other insect bite, recent infection, recent antibiotic use, new medication, new clothing, recent travel, pet/animal contact, thyroid disease, chronic liver disease, autoimmune disease, chronic edema, and prior STD.    Pt is also concerned about DM and would like labs done.  Current Medications (verified): 1)  Calcium Carbonate-Vitamin D 600-400 Mg-Unit  Tabs (Calcium Carbonate-Vitamin D) .... Take 1 Tablet By Mouth Once A Day 2)  Multivitamins   Tabs (Multiple Vitamin) .... Take 1 Tablet By Mouth Once A Day 3)  Exforge 5-160 Mg Tabs (Amlodipine Besylate-Valsartan) .... Take 1 Tab Once Daily 4)  Premarin 0.45 Mg Tabs (Estrogens Conjugated) .[name].. 1 By Mouth Once Daily 5)  Fluticasone  Propionate 50 Mcg/act Susp (Fluticasone Propionate) .... 2 Puffs  Two Times A Day 6)  Zyrtec Allergy 10 Mg Caps (Cetirizine Hcl) .[name].. 1 By Mouth At Bedtime 7)  Vicodin Es 7.5-750 Mg Tabs (Hydrocodone-Acetaminophen) .[name].. 1 By Mouth Every 6 Hours As Needed 8)  Flexeril 10 Mg Tabs (Cyclobenzaprine Hcl) .[name].. 1 By Mouth Three Times A Day As Needed 9)  Hydrochlorothiazide 12.5 Mg Tabs (Hydrochlorothiazide) .... Take 1 Tab Once Daily 10)  Prednisone 10 Mg Tabs (Prednisone) .... 3 By Mouth Once Daily For 3 Days Then 2 By Mouth Once Daily For 3 Days Then 1 By Mouth Once Daily For 3 Days  Allergies (verified): 1)  ! Adhesive Bandages Fexible (Adhesive Bandages)  Past History:  Past Medical History: Last updated: 01/23/2009 Anemia-NOS Atrial fibrillation: PAF initially documented in 2005 and again in 07/2006 Headache Hypertension GERD Gastritis fatty Liver Diverticulosis Internal Hemorroids depression Sleep apnea COLON POLYPS Asthma  Past Surgical History: Last updated: 04/21/2008 Hysterectomy--partial Appendectomy Colonoscopy LAST DONE 5/09 Breast reduction TAH-->BSO cervical fusion  Family History: Last updated: 01/28/2008 Family History Diabetes 1st degree relative Family History of CAD Female 1st degree relative <60  Social History: Last updated: 02/13/2009 Married Never Smoked Alcohol use-no Occupation: [employer] Drug use-no Regular exercise-no 2 children  Risk Factors: Alcohol Use: 0 (03/09/2009) Caffeine Use: 0 (01/28/2008) Exercise: no (03/09/2009)  Risk Factors: Smoking Status: never (03/09/2009) Passive Smoke Exposure: no (01/28/2008)  Family History: Reviewed history from 01/28/2008 and no changes required.  Family History Diabetes 1st degree relative Family History of CAD Female 1st degree relative <60  Social History: Reviewed history from 02/13/2009 and no changes required. Married Never Smoked Alcohol use-no Occupation: [employer] Drug  use-no Regular exercise-no 2 children  Review of Systems      See HPI  Physical Exam  General:  Well-developed,well-nourished,in no acute distress; alert,appropriate and cooperative throughout examination Lungs:  Normal respiratory effort, chest expands symmetrically. Lungs are clear to auscultation, no crackles or wheezes. Heart:  normal rate and no murmur.   Extremities:  No clubbing, cyanosis, edema, or deformity noted with normal full range of motion of all joints.   Skin:  papules on arms and legs Cervical Nodes:  No lymphadenopathy noted Psych:  Cognition and judgment appear intact. Alert and cooperative with normal attention span and concentration. No apparent delusions, illusions, hallucinations   Impression & Recommendations:  Problem # 1:  RASH-NONVESICULAR (ICD-782.1)  Discussed medication use and symptom control.   Problem # 2:  HYPERTENSION (ICD-401.9)  Her updated medication list for this problem includes:    Exforge 5-160 Mg Tabs (Amlodipine besylate-valsartan) .[name]... Take 1 tab once daily    Hydrochlorothiazide 12.5 Mg Tabs (Hydrochlorothiazide) .[name]... Take 1 tab once daily  Orders: Venipuncture (81191) TLB-Lipid Panel (80061-LIPID) TLB-BMP (Basic Metabolic Panel-BMET) (80048-METABOL) TLB-CBC Platelet - w/Differential (85025-CBCD) TLB-Hepatic/Liver Function Pnl (80076-HEPATIC) TLB-TSH (Thyroid Stimulating Hormone) (84443-TSH) TLB-A1C / Hgb A1C (Glycohemoglobin) (83036-A1C) Specimen Handling (47829)  BP today: 140/88 Prior BP: 122/76 (08/21/2009)  Labs Reviewed: K+: 4.2 Hemolyzed mEq/L (08/21/2009) Creat: : 0.7 (08/21/2009)   Chol: 135 (12/13/2007)   HDL: 39.4 (12/13/2007)   LDL: 73 (12/13/2007)   TG: 111 (12/13/2007)  Problem # 3:  ANEMIA-NOS (ICD-285.9)  Orders: Venipuncture (56213) TLB-Lipid Panel (80061-LIPID) TLB-BMP (Basic Metabolic Panel-BMET) (80048-METABOL) TLB-CBC Platelet - w/Differential (85025-CBCD) TLB-Hepatic/Liver Function Pnl  (80076-HEPATIC) TLB-TSH (Thyroid Stimulating Hormone) (84443-TSH) TLB-A1C / Hgb A1C (Glycohemoglobin) (83036-A1C) Specimen Handling (08657)  Hgb: 14.3 (03/09/2009)   Hct: 42.5 (03/09/2009)   Platelets: 371 (03/09/2009) RBC: 4.39 (03/09/2009)   RDW: 13.1 (03/09/2009)   WBC: 6.4 (03/09/2009) MCV: 96.8 (03/09/2009)   MCHC: 33.6 (03/09/2009) Iron: 76 (12/13/2007)   % Sat: 26.6 (12/13/2007) TSH: 0.71 (12/13/2007)  Problem # 4:  FAMILY HISTORY DIABETES 1ST DEGREE RELATIVE (ICD-V18.0)  Orders: Venipuncture (84696) TLB-Lipid Panel (80061-LIPID) TLB-BMP (Basic Metabolic Panel-BMET) (80048-METABOL) TLB-CBC Platelet - w/Differential (85025-CBCD) TLB-Hepatic/Liver Function Pnl (80076-HEPATIC) TLB-TSH (Thyroid Stimulating Hormone) (84443-TSH) TLB-A1C / Hgb A1C (Glycohemoglobin) (83036-A1C) Specimen Handling (29528)  Complete Medication List: 1)  Calcium Carbonate-vitamin D 600-400 Mg-unit Tabs (Calcium carbonate-vitamin d) .... Take 1 tablet by mouth once a day 2)  Multivitamins Tabs (Multiple vitamin) .... Take 1 tablet by mouth once a day 3)  Exforge 5-160 Mg Tabs (Amlodipine besylate-valsartan) .... Take 1 tab once daily 4)  Premarin 0.45 Mg Tabs (Estrogens conjugated) .[name].. 1 by mouth once daily 5)  Fluticasone Propionate 50 Mcg/act Susp (Fluticasone propionate) .... 2 puffs  two times a day 6)  Zyrtec Allergy 10 Mg Caps (Cetirizine hcl) .[name].. 1 by mouth at bedtime 7)  Vicodin Es 7.5-750 Mg Tabs (Hydrocodone-acetaminophen) .[name].. 1 by mouth every 6 hours as needed 8)  Flexeril 10 Mg Tabs (Cyclobenzaprine hcl) .[name].. 1 by mouth three times a day as needed 9)  Hydrochlorothiazide 12.5 Mg Tabs (Hydrochlorothiazide) .... Take 1 tab once daily 10)  Prednisone 10 Mg Tabs (Prednisone) .... 3 by mouth once daily for 3 days then 2 by mouth once daily for 3 days then 1 by mouth  once daily for 3 days  Other Orders: Admin 1st Vaccine ([address]109) Flu Vaccine 72yrs + (60454)  Patient Instructions: 1)  Please  schedule a follow-up appointment in 3 months .  Prescriptions: PREDNISONE 10 MG TABS (PREDNISONE) 3 by mouth once daily for 3 days then 2 by mouth once daily for 3 days then 1 by mouth once daily for 3 days  #18 x 0   Entered and Authorized by:   Loreen Freud DO   Signed by:   Loreen Freud DO on 11/14/2009   Method used:   Print then Give to Patient   RxID:   [id number] PREMARIN 0.45 MG TABS (ESTROGENS CONJUGATED) 1 by mouth once daily  #30 x 2   Entered and Authorized by:   Loreen Freud DO   Signed by:   Loreen Freud DO on 11/14/2009   Method used:   Electronically to        [facility]. [address] [phone]* (retail)       [address]       [place], [place]  [address]       Ph: [phone] or [phone]       Fax: [phone]   RxID:   [id number]  Flu Vaccine Consent Questions     Do you have a history of severe allergic reactions to this vaccine? no    Any prior history of allergic reactions to egg and/or gelatin? no    Do you have a sensitivity to the preservative Thimersol? no    Do you have a past history of Guillan-Barre Syndrome? no    Do you currently have an acute febrile illness? no    Have you ever had a severe reaction to latex? no    Vaccine information given and explained to patient? yes    Are you currently pregnant? no    Lot Number:[id number]   Exp Date:07/06/2010   Site Given  Left Deltoid IM  Orders Added: 1)  Admin 1st Vaccine [90471] 2)  Flu Vaccine 67yrs + [90658] 3)  Venipuncture [36415] 4)  TLB-Lipid Panel [80061-LIPID] 5)  TLB-BMP (Basic Metabolic Panel-BMET) [80048-METABOL] 6)  TLB-CBC Platelet - w/Differential [85025-CBCD] 7)  TLB-Hepatic/Liver Function Pnl [80076-HEPATIC] 8)  TLB-TSH (Thyroid Stimulating Hormone) [84443-TSH] 9)  TLB-A1C / Hgb A1C (Glycohemoglobin) [83036-A1C] 10)  Specimen Handling [99000] 11)  Est. Patient Level III [88416]   .lbflu

## 2010-02-05 NOTE — Procedures (Signed)
Summary: Colonoscopy  Colonoscopy   Imported By: Freddy Jaksch 05/20/2007 09:25:16  _____________________________________________________________________  External Attachment:    Type:   Image     Comment:   External Document

## 2010-02-05 NOTE — Progress Notes (Signed)
Summary: Lab results  Phone Note Call from Patient Call back at Work Phone [phone]   Caller: Patient Call For: Loreen Freud DO Summary of Call: Patient needs lab results explained to her.  Please call today. Initial call taken by: Barnie Mort,  November 19, 2009 9:13 AM  Follow-up for Phone Call        Reviewed results with pt, pt verbalized understanding ............Marland KitchenFelecia Deloach CMA  November 19, 2009 4:41 PM

## 2010-02-05 NOTE — Progress Notes (Signed)
Summary: written rx for Diovan  Phone Note Refill Request Message from:  Patient  Refills Requested: Medication #1:  DIOVAN HCT 160-12.5 MG TABS Take 1 tablet by mouth once a day Dr.Lowne  Call Back [phone] pt's husband came in the office requesting a written rx for Diovan Hct for mail order.  Pt will pick the rx up.   Method Requested: Pick up at Office Initial call taken by: Vanessa [place],  April 30, 2007 9:21 AM  Follow-up for Phone Call        Patient spouse aware to pick up rx at front desk.  ...................................................................Ardyth Man  April 30, 2007 9:51 AM  Follow-up by: Ardyth Man,  April 30, 2007 9:51 AM      Prescriptions: DIOVAN HCT 160-12.5 MG TABS (VALSARTAN-HYDROCHLOROTHIAZIDE) Take 1 tablet by mouth once a day  #90 x 0   Entered by:   Ardyth Man   Authorized by:   Loreen Freud DO   Signed by:   Ardyth Man on 04/30/2007   Method used:   Print then Give to Patient   RxID:   [id number]

## 2010-02-05 NOTE — Assessment & Plan Note (Signed)
Summary: Acute NP office visit - bronchitis   Copy to:  Dr. Loreen Freud Primary Provider/Referring Provider:  Loreen Freud DO  CC:  prod cough with yellow/green mucus, sinus pressure/congestion with same colored mucus, wheezing, increased SOB, and sweats x5days denies fever.  History of Present Illness: 53/F, never smoker referred for evalaution of cough, wheezing & dyspnea since Oct 2010. This started with a head & chest cold in october, went away , then returned. On 12/04/08 OV, she received a steroid shot, MDI & avelox & symbicort were given On 1/18 OV, received prednisone. She was seen by Dr Willa Rough (allergist). She required an ER visit 12/12/08 & again on 02/05/09 >> d-dimer neg, prednisone 30 mg , cheratussin , told she had bronchitis. CXR 1/19 no infx or effusion. Prednisone has caused weight gain.  History does not indicate any environmental trigger. Pro- air causes palpitations  Initial impresion was :Asthmatic bronchitis vs reactive airway disease - no clear trigger Take advair 500/50 sample two times a day instead of symbicort Take singulair once daily x 2 weeks Cheratussin at bedtime   February 27, 2009 2:12 PM  Much improved . "100%" better. Pt does c/o clearing throat, some wheezing, and and productive cough when hot. She denies sputum production, chest pain, orthopnea or pND.  April 02, 2009--Presents for an acute office visit Complains of  prod cough with yellow mucus, wheezing x3days. Wonders if allergies may be the culprit and would like to discuss a referral - She is not taking any meds for prevention. Says the pollen, flowers make her symptoms worse.  Could not afford Advair or Singulair. Denies chest pain, dyspnea, orthopnea, hemoptysis, fever, n/v/d, edema, headache.  December 04, 2009--Presents for an acute office visit. Complains of prod cough with yellow/green mucus, sinus pressure/congestion with same colored mucus, wheezing, increased SOB, sweats x5days. Previously  recommended to take Advair and singulair however did not fill after samples ran out. Last seen 6 months ago says she did fine until last week when she got this cold. Husband and grandson have had similar symptoms. OTC meds not working, and cough is keeping her up at night. Denies chest pain,  orthopnea, hemoptysis, fever, n/v/d, edema, headache.   Medications Prior to Update: 1)  Calcium Carbonate-Vitamin D 600-400 Mg-Unit  Tabs (Calcium Carbonate-Vitamin D) .... Take 1 Tablet By Mouth Once A Day 2)  Multivitamins   Tabs (Multiple Vitamin) .... Take 1 Tablet By Mouth Once A Day 3)  Exforge 5-160 Mg Tabs (Amlodipine Besylate-Valsartan) .... Take 1 Tab Once Daily 4)  Premarin 0.45 Mg Tabs (Estrogens Conjugated) .[name].. 1 By Mouth Once Daily 5)  Fluticasone Propionate 50 Mcg/act Susp (Fluticasone Propionate) .... 2 Puffs  Two Times A Day 6)  Zyrtec Allergy 10 Mg Caps (Cetirizine Hcl) .[name].. 1 By Mouth At Bedtime 7)  Vicodin Es 7.5-750 Mg Tabs (Hydrocodone-Acetaminophen) .[name].. 1 By Mouth Every 6 Hours As Needed 8)  Flexeril 10 Mg Tabs (Cyclobenzaprine Hcl) .[name].. 1 By Mouth Three Times A Day As Needed 9)  Hydrochlorothiazide 12.5 Mg Tabs (Hydrochlorothiazide) .... Take 1 Tab Once Daily  Current Medications (verified): 1)  Calcium Carbonate-Vitamin D 600-400 Mg-Unit  Tabs (Calcium Carbonate-Vitamin D) .... Take 1 Tablet By Mouth Once A Day 2)  Multivitamins   Tabs (Multiple Vitamin) .... Take 1 Tablet By Mouth Once A Day 3)  Exforge 5-160 Mg Tabs (Amlodipine Besylate-Valsartan) .... Take 1 Tab Once Daily 4)  Premarin 0.45 Mg Tabs (Estrogens Conjugated) .[name].. 1 By Mouth Once Daily 5)  Fluticasone Propionate 50 Mcg/act Susp (Fluticasone Propionate) .... 2 Puffs  Two Times A Day 6)  Zyrtec Allergy 10 Mg Caps (Cetirizine Hcl) .[name].. 1 By Mouth At Bedtime 7)  Flexeril 10 Mg Tabs (Cyclobenzaprine Hcl) .[name].. 1 By Mouth Three Times A Day As Needed  Allergies (verified): 1)  ! Adhesive Bandages Fexible (Adhesive  Bandages)  Past History:  Past Medical History: Last updated: 01/23/2009 Anemia-NOS Atrial fibrillation: PAF initially documented in 2005 and again in 07/2006 Headache Hypertension GERD Gastritis fatty Liver Diverticulosis Internal Hemorroids depression Sleep apnea COLON POLYPS Asthma  Past Surgical History: Last updated: 04/21/2008 Hysterectomy--partial Appendectomy Colonoscopy LAST DONE 5/09 Breast reduction TAH-->BSO cervical fusion  Family History: Last updated: 01/28/2008 Family History Diabetes 1st degree relative Family History of CAD Female 1st degree relative <60  Social History: Last updated: 02/13/2009 Married Never Smoked Alcohol use-no Occupation: [employer] Drug use-no Regular exercise-no 2 children  Risk Factors: Smoking Status: never (03/09/2009) Passive Smoke Exposure: no (01/28/2008)  Review of Systems      See HPI  Vital Signs:  Patient profile:   55 year old female Height:      65 inches Weight:      230 pounds BMI:     38.41 O2 Sat:      100 % on Room air Temp:     97.5 degrees F oral Pulse rate:   93 / minute BP sitting:   128 / 74  (left arm) Cuff size:   large  Vitals Entered By: Boone Master CNA/MA (December 04, 2009 3:29 PM)  O2 Flow:  Room air CC: prod cough with yellow/green mucus, sinus pressure/congestion with same colored mucus, wheezing, increased SOB, sweats x5days denies fever Is Patient Diabetic? No Comments Medications reviewed with patient Daytime contact number verified with patient. Boone Master CNA/MA  December 04, 2009 3:30 PM    Physical Exam  Additional Exam:  Gen. Pleasant, well-nourished, in no distress, normal affect ENT - no lesions, pale nasal mucosa w/ clear discharge .  Neck: No JVD, no thyromegaly, no carotid bruits Lungs: coarse BS w/ exp wheezing  Cardiovascular: Rhythm regular, heart sounds  normal, no murmurs or gallops, no peripheral edema Musculoskeletal: No deformities, no cyanosis  or clubbing      Impression & Recommendations:  Problem # 1:  ASTHMA (ICD-493.90)  Exacerbation w/ bronchitis/URI Plan:  Saline nasal rinses two times a day Lloyd Huger Med Sinus rinse), may use saline gel as well at bedtime   Fluticasone 2 puffs two times a day ( I sent this prescription to pharmacy. ) Begin Zyrtec (generic brand is fine) at bedtime  May use Mucinex DM two times a day as needed cough/congesiton  Increase fluids, ie-water Augmentin  875mg  two times a day for 7 days Prednisone taper over next week.  ProAir 2 puffs every 4 hr as needed for wheezing.  follow up 2 weeks Dr. Vassie Loll and as needed  Please contact office for sooner follow up if symptoms do not improve or worsen   Orders: Est. Patient Level IV ([address]109)  Medications Added to Medication List This Visit: 1)  Augmentin 875-125 Mg Tabs (Amoxicillin-pot clavulanate) .[name].. 1 by mouth two times a day 2)  Prednisone 10 Mg Tabs (Prednisone) .... 4 tabs for 2 days, then 3 tabs for 2 days, 2 tabs for 2 days, then 1 tab for 2 days, then stop 3)  Hydromet 5-1.5 Mg/68ml Syrp (Hydrocodone-homatropine) .[name].. 1-2 tsp every 4-6 hrs as needed cough, may make you sleepy  Complete Medication List: 1)  Calcium Carbonate-vitamin D 600-400 Mg-unit Tabs (Calcium carbonate-vitamin d) .... Take 1 tablet by mouth once a day 2)  Multivitamins Tabs (Multiple vitamin) .... Take 1 tablet by mouth once a day 3)  Exforge 5-160 Mg Tabs (Amlodipine besylate-valsartan) .... Take 1 tab once daily 4)  Premarin 0.45 Mg Tabs (Estrogens conjugated) .[name].. 1 by mouth once daily 5)  Fluticasone Propionate 50 Mcg/act Susp (Fluticasone propionate) .... 2 puffs  two times a day 6)  Zyrtec Allergy 10 Mg Caps (Cetirizine hcl) .[name].. 1 by mouth at bedtime 7)  Flexeril 10 Mg Tabs (Cyclobenzaprine hcl) .[name].. 1 by mouth three times a day as needed 8)  Augmentin 875-125 Mg Tabs (Amoxicillin-pot clavulanate) .[name].. 1 by mouth two times a day 9)  Prednisone 10 Mg Tabs  (Prednisone) .... 4 tabs for 2 days, then 3 tabs for 2 days, 2 tabs for 2 days, then 1 tab for 2 days, then stop 10)  Hydromet 5-1.5 Mg/71ml Syrp (Hydrocodone-homatropine) .[name].. 1-2 tsp every 4-6 hrs as needed cough, may make you sleepy  Patient Instructions: 1)  Saline nasal rinses two times a day Lloyd Huger Med Sinus rinse), may use saline gel as well at bedtime  2)   Fluticasone 2 puffs two times a day ( I sent this prescription to pharmacy. ) 3)  Begin Zyrtec (generic brand is fine) at bedtime  4)  May use Mucinex DM two times a day as needed cough/congesiton  5)  Increase fluids, ie-water 6)  Augmentin  875mg  two times a day for 7 days 7)  Prednisone taper over next week.  8)  ProAir 2 puffs every 4 hr as needed for wheezing.  9)  follow up 2 weeks Dr. Vassie Loll and as needed  10)  Please contact office for sooner follow up if symptoms do not improve or worsen  Prescriptions: FLUTICASONE PROPIONATE 50 MCG/ACT SUSP (FLUTICASONE PROPIONATE) 2 puffs  two times a day  #1 x 5   Entered and Authorized by:   Rubye Oaks NP   Signed by:   Tammy Parrett NP on 12/04/2009   Method used:   Print then Mail to Patient   RxID:   [id number] HYDROMET 5-1.5 MG/5ML SYRP (HYDROCODONE-HOMATROPINE) 1-2 tsp every 4-6 hrs as needed cough, may make you sleepy  #8 oz x 0   Entered and Authorized by:   Rubye Oaks NP   Signed by:   Tammy Parrett NP on 12/04/2009   Method used:   Print then Mail to Patient   RxID:   [id number] PREDNISONE 10 MG TABS (PREDNISONE) 4 tabs for 2 days, then 3 tabs for 2 days, 2 tabs for 2 days, then 1 tab for 2 days, then stop  #20 x 0   Entered and Authorized by:   Rubye Oaks NP   Signed by:   Rubye Oaks NP on 12/04/2009   Method used:   Electronically to        [facility] (retail)       [address]       [place], [place]  [address]       Ph: [phone]       Fax: [phone]   RxID:   [id number] AUGMENTIN 875-125 MG TABS  (AMOXICILLIN-POT CLAVULANATE) 1 by mouth two times a day  #14 x 0   Entered and Authorized by:   Rubye Oaks NP   Signed by:   Tammy  Parrett NP on 12/04/2009   Method used:   Electronically to        [facility] (retail)       [address]       [place], [place]  [address]       Ph: [phone]       Fax: [phone]   RxID:   [id number]   Appended Document: Orders Update - neb treatment documentation     Clinical Lists Changes  Orders: Added new Service order of Nebulizer Tx (75643) - Signed

## 2010-02-05 NOTE — Progress Notes (Signed)
Summary: [facility] 05/08/08  Phone Note Call from Patient Call back at Work Phone [phone] Call back at [phone]   Caller: Patient Summary of Call: pt states that since starting bp med her stomach has been very irrated. pt complains of burning and very lose bowels. pt states that she saw GI for these symptoms and dr stark  gave her antibiotic pt states she took med but still did not get any relief.. pt states that she has taking BP med on empty stomach and with food but stomach still continues to bother her. pt would like to know should she go back to GI or should she come in to see you. .........Marland KitchenFelecia Deloach CMA  May 08, 2008 3:53 PM  Follow-up for Phone Call        We can change the bp med---- stop diovan-----try lotrel--sent to [facility]----ov 2 weeks to check bp---can come for ov here if she would like to before that.  Follow-up by: Loreen Freud DO,  May 08, 2008 5:35 PM  Additional Follow-up for Phone Call Additional follow up Details #1::        lEFT MESSAGE FOR PATIENT TO CALL THE OFFICE Ardyth Man  May 09, 2008 8:35 AM  Additional Follow-up by: Ardyth Man,  May 09, 2008 8:35 AM    Additional Follow-up for Phone Call Additional follow up Details #2::    Patient aware Ardyth Man  May 09, 2008 8:43 AM   New/Updated Medications: LOTREL 5-10 MG CAPS (AMLODIPINE BESY-BENAZEPRIL HCL) 1 by mouth once daily   Prescriptions: LOTREL 5-10 MG CAPS (AMLODIPINE BESY-BENAZEPRIL HCL) 1 by mouth once daily  #30 x 2   Entered and Authorized by:   Loreen Freud DO   Signed by:   Loreen Freud DO on 05/08/2008   Method used:   Electronically to        [facility] [address] [phone]* (retail)       [address]       [place], [place]  [address]       Ph: [phone] or [phone]       Fax: [phone]   RxID:   [id number]

## 2010-02-05 NOTE — Assessment & Plan Note (Signed)
Summary: DR. Ladona Ridgel WANTS TO GET ON SOME ESTROGEN/NS/KDC   Vital Signs:  Patient profile:   55 year old female Height:      65 inches Weight:      228 pounds Temp:     98.9 degrees F oral Pulse rate:   82 / minute BP sitting:   140 / 90  (left arm)  Vitals Entered By: Jeremy Johann CMA (September 04, 2008 4:02 PM) CC: discuss med for hot flashes   History of Present Illness: Pt here f/u Dr Ladona Ridgel.  Pt is experiencing hot flashes that are keeping her awake and affecting her relationship with her husband.  Pt was admitted to hospital with chest pain and is seeing Dr Ladona Ridgel.----- w/u normal per pt.  Hot flashes are unbearable.    Current Medications (verified): 1)  Calcium .[name].. 1 Tablet By Mouth Once Daily 2)  Multivitamin .[name].. 1 Tablet By Mouth Once Daily 3)  Flaxseed Oil .[name].. 1 Tablet By Mouth Once Daily 4)  Exforge Hct 5-160-12.5 Mg Tabs (Amlodipine-Valsartan-Hctz) .[name].. 1 By Mouth Once Daily 5)  Premarin 0.3 Mg Tabs (Estrogens Conjugated) .[name].. 1 By Mouth Once Daily  Allergies: 1)  ! Adhesive Bandages Fexible (Adhesive Bandages)  Past History:  Past medical, surgical, family and social histories (including risk factors) reviewed, and no changes noted (except as noted below).  Past Medical History: Reviewed history from 04/21/2008 and no changes required. Anemia-NOS Atrial fibrillation: PAF initially documented in 2005 and again in 07/2006 Headache Hypertension GERD Gastritis fatty Liver Diverticulosis Internal Hemorroids depression Sleep apnea COLON POLYPS  Past Surgical History: Reviewed history from 04/21/2008 and no changes required. Hysterectomy--partial Appendectomy Colonoscopy LAST DONE 5/09 Breast reduction TAH-->BSO cervical fusion  Family History: Reviewed history from 01/28/2008 and no changes required. Family History Diabetes 1st degree relative Family History of CAD Female 1st degree relative <60  Social History: Reviewed history from 01/28/2008  and no changes required. Married Never Smoked Alcohol use-no Occupation: [employer] Drug use-no Regular exercise-no  Review of Systems      See HPI  Physical Exam  General:  Well-developed,well-nourished,in no acute distress; alert,appropriate and cooperative throughout examination Lungs:  Normal respiratory effort, chest expands symmetrically. Lungs are clear to auscultation, no crackles or wheezes. Heart:  Normal rate and regular rhythm. S1 and S2 normal without gallop, murmur, click, rub or other extra sounds. Extremities:  No clubbing, cyanosis, edema, or deformity noted with normal full range of motion of all joints.   Skin:  Intact without suspicious lesions or rashes Psych:  Cognition and judgment appear intact. Alert and cooperative with normal attention span and concentration. No apparent delusions, illusions, hallucinations   Impression & Recommendations:  Problem # 1:  MENOPAUSAL SYNDROME (ICD-627.2)  Her updated medication list for this problem includes:    Premarin 0.3 Mg Tabs (Estrogens conjugated) .[name]... 1 by mouth once daily  Discussed treatment options.   Complete Medication List: 1)  Calcium  .[name].. 1 tablet by mouth once daily 2)  Multivitamin  .[name].. 1 tablet by mouth once daily 3)  Flaxseed Oil  .[name].. 1 tablet by mouth once daily 4)  Exforge Hct 5-160-12.5 Mg Tabs (Amlodipine-valsartan-hctz) .[name].. 1 by mouth once daily 5)  Premarin 0.3 Mg Tabs (Estrogens conjugated) .[name].. 1 by mouth once daily Prescriptions: PREMARIN 0.3 MG TABS (ESTROGENS CONJUGATED) 1 by mouth once daily  #30 x 11   Entered and Authorized by:   Loreen Freud DO   Signed by:   Loreen Freud DO on  09/04/2008   Method used:   Electronically to        [facility]. [address] [phone]* (retail)       [address]       [place], [place]  [address]       Ph: [phone] or [phone]       Fax: [phone]   RxID:   [id number]

## 2010-02-05 NOTE — Progress Notes (Signed)
Summary: leg cramps  Phone Note Call from Patient   Caller: Patient Summary of Call: PT states that she has been bother with leg cramps all night long. pt states that she does not know if this is a side effect from the steroid injection she received. dr Laury Axon pls called................Marland KitchenFelecia Deloach CMA  February 06, 2009 2:33 PM   Follow-up for Phone Call        It should not and rest of labs were normal so no low K etc.   Follow-up by: Loreen Freud DO,  February 06, 2009 3:00 PM  Additional Follow-up for Phone Call Additional follow up Details #1::        pt aware..................Marland KitchenFelecia Deloach CMA  February 06, 2009 3:27 PM

## 2010-02-05 NOTE — Assessment & Plan Note (Signed)
Summary: EPH/KFW    Visit Type:  EPH  CC:  palpitations.  Current Medications (verified): 1)  Calcium .[name].. 1 Tablet By Mouth Once Daily 2)  Multivitamin .[name].. 1 Tablet By Mouth Once Daily 3)  Flaxseed Oil .[name].. 1 Tablet By Mouth Once Daily 4)  Exforge Hct 5-160-12.5 Mg Tabs (Amlodipine-Valsartan-Hctz) .[name].. 1 By Mouth Once Daily 5)  Flexeril 10 Mg Tabs (Cyclobenzaprine Hcl) .[name].. 1 By Mouth Three Times A Day As Needed  Allergies: 1)  ! Adhesive Bandages Fexible (Adhesive Bandages)  Vital Signs:  Patient profile:   55 year old female Height:      65 inches Weight:      227.13 pounds BMI:     37.93 Pulse rate:   74 / minute BP sitting:   128 / 70  (left arm) Cuff size:   large  Vitals Entered By: Oswald Hillock (August 29, 2008 12:29 PM)   Other Orders: EKG w/ Interpretation (93000)  Appended Document: [facility]      Primary Provider:  Loreen Freud, MD   History of Present Illness: Mr. Filosa returns today for followup.  She was recently hospitalized 5 weeks ago with non-cardiac c/p and was found to be in atrial fibrillation.  Since then she has been stable except that she notes that she has been under increased stress and has been bothered by a cough.  She denies c/p, dyspnea or peripheral edema.  She has rare palpitations.  Her cough is non-productive.  Allergies: 1)  ! Adhesive Bandages Fexible (Adhesive Bandages)  Past History:  Past Medical History: Last updated: 04/21/2008 Anemia-NOS Atrial fibrillation: PAF initially documented in 2005 and again in 07/2006 Headache Hypertension GERD Gastritis fatty Liver Diverticulosis Internal Hemorroids depression Sleep apnea COLON POLYPS  Past Surgical History: Last updated: 04/21/2008 Hysterectomy--partial Appendectomy Colonoscopy LAST DONE 5/09 Breast reduction TAH-->BSO cervical fusion  Review of Systems       The patient complains of dyspnea on exertion.  The patient denies chest  pain, syncope, and peripheral edema.    Physical Exam  General:  Obese, NAD Head:  normocephalic and atraumatic Eyes:  PERRLA/EOM intact; conjunctiva and lids normal. Mouth:  Teeth, gums and palate normal. Oral mucosa normal. Neck:  Neck supple, no JVD. No masses, thyromegaly or abnormal cervical nodes. Lungs:  Normal respiratory effort, chest expands symmetrically. Lungs are clear to auscultation, no crackles or wheezes. Heart:  Normal rate and regular rhythm. S1 and S2 normal without gallop, murmur, click, rub or other extra sounds. Abdomen:  SOFT,TENDER LUQ,LMQ AND LLQ,NO GUARDING, NO REBOUND,BS+,NO MASS OR HSM Msk:  normal ROM, no joint tenderness, no joint swelling, no joint warmth, no redness over joints, no joint deformities, no joint instability, and no crepitation.   Pulses:  R posterior tibial normal, R dorsalis pedis normal, R carotid normal, L posterior tibial normal, L dorsalis pedis normal, and L carotid normal.   Extremities:  No clubbing, cyanosis, edema, or deformity noted with normal full range of motion of all joints.   Neurologic:  No cranial nerve deficits noted. Station and gait are normal. Plantar reflexes are down-going bilaterally. DTRs are symmetrical throughout. Sensory, motor and coordinative functions appear intact.   EKG  Procedure date:  08/29/2008  Findings:      Normal sinus rhythm with rate of:  67.  Impression & Recommendations:  Problem # 1:  CHEST PAIN UNSPECIFIED (ICD-786.50) This is well controlled at present.  Continue current meds. Her updated medication list for this problem includes:  Exforge Hct 5-160-12.5 Mg Tabs (Amlodipine-valsartan-hctz) .[name]... 1 by mouth once daily  Problem # 2:  BRADYCARDIA (ICD-427.89) She is currently asymptomatic. Her updated medication list for this problem includes:    Exforge Hct 5-160-12.5 Mg Tabs (Amlodipine-valsartan-hctz) .[name]... 1 by mouth once daily  Problem # 3:  ATRIAL FIBRILLATION (ICD-427.31) I  suspect she continues to have paroxysms.  These are not too severe at this time.  Will hold off on any anti-arrhythmic meds for now.

## 2010-02-05 NOTE — Letter (Signed)
Summary: Patient Notice- Polyp Results  [facility]  [address] [place], [place] [address]   Phone: [phone]  Fax: [phone]        May 17, 2007 MRN: [id number]    [facility] [address] [place], [place]  [address]    Dear Ms. Santoli,  I am pleased to inform you that the colon polyp(s) removed during your recent colonoscopy was (were) found to be benign (no cancer detected) upon pathologic examination.  I recommend you have a repeat colonoscopy examination in 5 years to look for recurrent polyps, as having colon polyps increases your risk for having recurrent polyps or even colon cancer in the future.  Should you develop new or worsening symptoms of abdominal pain, bowel habit changes or bleeding from the rectum or bowels, please schedule an evaluation with either your primary care physician or with me.  Continue treatment plan as outlined the day of your exam.  Please call us if you are having persistent problems or have questions about your condition that have not been fully answered at this time.  Sincerely,  Meryl Dare MD  This letter has been electronically signed by your physician.

## 2010-02-05 NOTE — Progress Notes (Signed)
Summary: Results (lmom 1/20)  Phone Note Outgoing Call Call back at Work Phone [phone]   Summary of Call: Left message with pt's husband to call back regarding results: No active disease Initial call taken by: Army Fossa CMA,  January 25, 2009 8:09 AM  Follow-up for Phone Call        I spoke with pt and gave her this information. And she states her chest is really sore. The inhaler does not seem to be working. Army Fossa CMA  January 25, 2009 9:20 AM   Additional Follow-up for Phone Call Additional follow up Details #1::        con't inhaler and other current meds take otc antihistamine---zyrtec or claritin etc Is she having any reflux symptoms,  heartburn, burbing etc? If yes take Prilosec otc we will refer to pulm as well Additional Follow-up by: Loreen Freud DO,  January 25, 2009 10:30 AM  New Problems: COUGH (ICD-786.2)   Additional Follow-up for Phone Call Additional follow up Details #2::    She states the inhaler makes her cough more. Army Fossa CMA  January 25, 2009 11:52 AM   Additional Follow-up for Phone Call Additional follow up Details #3:: Details for Additional Follow-up Action Taken: stop it for now referral pending cheratussin 6 oz  1 -2 tsp by mouth at bedtime as needed cough   left message for pt to call back. Army Fossa CMA  January 25, 2009 4:34 PM  left message for pt to call back Army Fossa [facility]  January 31, 2009 11:12 AM  Additional Follow-up by: Loreen Freud DO,  January 25, 2009 12:15 PM  New Problems: COUGH [phone])

## 2010-02-05 NOTE — Progress Notes (Signed)
Summary: abx  Phone Note Call from Patient Call back at Work Phone [phone]   Caller: Patient Call For: ALVA Reason for Call: Talk to Nurse Summary of Call: Patient saw TP on 11/29 and she prescribed augmentin.  Patient saying abx giving her diarrhea, she is asking if there is another abx she can try.  Initial call taken by: Lehman Prom,  December 07, 2009 9:16 AM  Follow-up for Phone Call        Pt c/o diarrhea since taking Augmentin two times a day. Pt states she has been taking same with food. Productive cough with yellow mucus is less, wheezing is less, breathing is better, denies fever and sinus pressure. Please advise. Thanks... [facility]. Zackery Barefoot CMA  December 07, 2009 10:31 AM   Additional Follow-up for Phone Call Additional follow up Details #1::        Begin yogurt two times a day  can stop augmentin begin zpack #1 take as directed.  no refills.  Please contact office for sooner follow up if symptoms do not improve or worsen   Additional Follow-up by: Rubye Oaks NP,  December 07, 2009 10:43 AM    Additional Follow-up for Phone Call Additional follow up Details #2::    Spoke with pt and notified of the above recs per TP.  Pt verbalized understanding.  Rx was sent to [facility] per her request. Follow-up by: Vernie Murders,  December 07, 2009 10:51 AM  New/Updated Medications: ZITHROMAX Z-PAK 250 MG TABS (AZITHROMYCIN) take as directed Prescriptions: ZITHROMAX Z-PAK 250 MG TABS (AZITHROMYCIN) take as directed  #1 x 0   Entered by:   Vernie Murders   Authorized by:   Rubye Oaks NP   Signed by:   Vernie Murders on 12/07/2009   Method used:   Electronically to        [facility] (retail)       [address]       [place], [place]  [address]       Ph: [phone]       Fax: [phone]   RxID:   [id number]

## 2010-02-05 NOTE — Progress Notes (Signed)
Summary: lowne--refill  Phone Note Refill Request   Refills Requested: Medication #1:  DIOVAN HCT 160-12.5 MG TABS Take 1 tablet by mouth once a day [facility]--[phone]  Initial call taken by: Freddy Jaksch,  November 18, 2007 9:55 AM      Prescriptions: DIOVAN HCT 160-12.5 MG TABS (VALSARTAN-HYDROCHLOROTHIAZIDE) Take 1 tablet by mouth once a day  #90 x 0   Entered by:   Ardyth Man   Authorized by:   Loreen Freud DO   Signed by:   Ardyth Man on 11/18/2007   Method used:   Faxed to ...       [facility] (mail-order)             , [place]         Ph:        Fax: [phone]   RxID:   [id number]

## 2010-02-05 NOTE — Progress Notes (Signed)
Summary: triage   Phone Note Call from Patient Call back at Work Phone [phone]   Caller: Patient Call For: Chessica Audia Reason for Call: Talk to Nurse Summary of Call: Patient states that she has been having sharp pains in her stomach x 2 days and it's been getting worse mostly after meals. Wants to be seen sooner than first available appt 3-24 Initial call taken by: Tawni Levy,  March 07, 2009 10:38 AM  Follow-up for Phone Call        Patient  is scheduled to see [name] 03/22/09 10:00.  I will place the patient on Dr Ardell Isaacs cancelation list Follow-up by: Darcey [name] RN, CGRN,  March 07, 2009 10:50 AM    [phone]

## 2010-02-05 NOTE — Assessment & Plan Note (Signed)
Summary: 2WK FU MEDICATION/RH......   Vital Signs:  Patient profile:   55 year old female Height:      65 inches Weight:      227.13 pounds BMI:     37.93 Temp:     98.0 degrees F oral Pulse rate:   80 / minute Resp:     18 per minute BP sitting:   142 / 98  (right arm)  Vitals Entered By: Ardyth Man (May 23, 2008 4:08 PM) CC: 2 week follow up on new BP med, pt. stopped made her "feel funny" went back to old medicine. Is Patient Diabetic? No Pain Assessment Patient in pain? no       Have you ever been in a relationship where you felt threatened, hurt or afraid?No   Does patient need assistance? Functional Status Self care Ambulation Normal   History of Present Illness:  Hypertension follow-up      This is a 55 year old woman who presents for Hypertension follow-up.  The patient denies lightheadedness, urinary frequency, headaches, edema, impotence, rash, and fatigue.  The patient denies the following associated symptoms: chest pain, chest pressure, exercise intolerance, dyspnea, palpitations, syncope, leg edema, and pedal edema.  Compliance with medications (by patient report) has been near 100%.  The patient reports that dietary compliance has been good.  Adjunctive measures currently used by the patient include salt restriction.    Current Medications (verified): 1)  Calcium .[name].. 1 Tablet By Mouth Once Daily 2)  Multivitamin .[name].. 1 Tablet By Mouth Once Daily 3)  Flaxseed Oil .[name].. 1 Tablet By Mouth Once Daily 4)  Exforge Hct 5-160-12.5 Mg Tabs (Amlodipine-Valsartan-Hctz) .[name].. 1 By Mouth Once Daily 5)  Flexeril 10 Mg Tabs (Cyclobenzaprine Hcl) .[name].. 1 By Mouth Three Times A Day As Needed 6)  Vicodin Es 7.5-750 Mg Tabs (Hydrocodone-Acetaminophen) .[name].. 1 By Mouth Every 6 Hours As Needed  Allergies (verified): 1)  ! Adhesive Bandages Fexible (Adhesive Bandages)  Past History:  Past medical, surgical, family and social histories (including risk factors) reviewed, and no  changes noted (except as noted below).  Past Medical History:    Reviewed history from 04/21/2008 and no changes required:    Anemia-NOS    Atrial fibrillation: PAF initially documented in 2005 and again in 07/2006    Headache    Hypertension    GERD    Gastritis    fatty Liver    Diverticulosis    Internal Hemorroids    depression    Sleep apnea    COLON POLYPS  Past Surgical History:    Reviewed history from 04/21/2008 and no changes required:    Hysterectomy--partial    Appendectomy    Colonoscopy LAST DONE 5/09    Breast reduction    TAH-->BSO    cervical fusion  Family History:    Reviewed history from 01/28/2008 and no changes required:       Family History Diabetes 1st degree relative       Family History of CAD Female 1st degree relative <60  Social History:    Reviewed history from 01/28/2008 and no changes required:       Married       Never Smoked       Alcohol use-no       Occupation: [employer]       Drug use-no       Regular exercise-no  Review of Systems      See HPI  Physical Exam  General:  Well-developed,well-nourished,in no acute distress; alert,appropriate and cooperative throughout examination Lungs:  Normal respiratory effort, chest expands symmetrically. Lungs are clear to auscultation, no crackles or wheezes. Heart:  Normal rate and regular rhythm. S1 and S2 normal without gallop, murmur, click, rub or other extra sounds. Extremities:  No clubbing, cyanosis, edema, or deformity noted with normal full range of motion of all joints.   Neurologic:  No cranial nerve deficits noted. Station and gait are normal. Plantar reflexes are down-going bilaterally. DTRs are symmetrical throughout. Sensory, motor and coordinative functions appear intact.   Impression & Recommendations:  Problem # 1:  HYPERTENSION (ICD-401.9)  The following medications were removed from the medication list:    Lotrel 5-10 Mg Caps (Amlodipine besy-benazepril hcl) .[name]... 1 by  mouth once daily Her updated medication list for this problem includes:    Exforge Hct 5-160-12.5 Mg Tabs (Amlodipine-valsartan-hctz) .[name]... 1 by mouth once daily  BP today: 142/98 Prior BP: 140/88 (04/21/2008)  Labs Reviewed: K+: 3.7 (12/13/2007) Creat: : 0.7 (12/13/2007)   Chol: 135 (12/13/2007)   HDL: 39.4 (12/13/2007)   LDL: 73 (12/13/2007)   TG: 111 (12/13/2007)  Complete Medication List: 1)  Calcium  .[name].. 1 tablet by mouth once daily 2)  Multivitamin  .[name].. 1 tablet by mouth once daily 3)  Flaxseed Oil  .[name].. 1 tablet by mouth once daily 4)  Exforge Hct 5-160-12.5 Mg Tabs (Amlodipine-valsartan-hctz) .[name].. 1 by mouth once daily 5)  Flexeril 10 Mg Tabs (Cyclobenzaprine hcl) .[name].. 1 by mouth three times a day as needed 6)  Vicodin Es 7.5-750 Mg Tabs (Hydrocodone-acetaminophen) .[name].. 1 by mouth every 6 hours as needed Prescriptions: VICODIN ES 7.5-750 MG TABS (HYDROCODONE-ACETAMINOPHEN) 1 by mouth every 6 hours as needed  #30 x 0   Entered and Authorized by:   Loreen Freud DO   Signed by:   Loreen Freud DO on 05/23/2008   Method used:   Print then Give to Patient   RxID:   [id number] FLEXERIL 10 MG TABS (CYCLOBENZAPRINE HCL) 1 by mouth three times a day as needed  #30 x 0   Entered and Authorized by:   Loreen Freud DO   Signed by:   Loreen Freud DO on 05/23/2008   Method used:   Electronically to        [facility]. [address] [phone]* (retail)       [address]       [place], [place]  [address]       Ph: [phone] or [phone]       Fax: [phone]   RxID:   [id number] EXFORGE HCT 5-160-12.5 MG TABS (AMLODIPINE-VALSARTAN-HCTZ) 1 by mouth once daily  #30 x 2   Entered and Authorized by:   Loreen Freud DO   Signed by:   Loreen Freud DO on 05/23/2008   Method used:   Print then Give to Patient   RxID:   [id number]

## 2010-02-05 NOTE — Procedures (Addendum)
Summary: Colonoscopy  Gastroenterology COLON   Imported By: Lowry Ram CMA 03/04/2007 12:33:56  _____________________________________________________________________  External Attachment:    Type:   Image     Comment:   External Document

## 2010-02-05 NOTE — Progress Notes (Signed)
Summary: REFILL  Phone Note Refill Request Message from:  Patient on July 30, 2009 3:33 PM  Refills Requested: Medication #1:  EXFORGE HCT 5-160-12.5 MG TABS 1 by mouth once daily [facility] Arletha Pili [facility]  Next Appointment Scheduled: [phone] Initial call taken by: Okey Regal Spring,  July 30, 2009 3:34 PM    Prescriptions: EXFORGE HCT 5-160-12.5 MG TABS (AMLODIPINE-VALSARTAN-HCTZ) 1 by mouth once daily  #90 x 0   Entered by:   Jeremy Johann CMA   Authorized by:   Loreen Freud DO   Signed by:   Jeremy Johann CMA on 07/30/2009   Method used:   Faxed to ...       [facility] [address] [address] [phone]* (retail)       [address]       [place], [place]  [address]       Ph: [phone] or [phone]       Fax: [phone]   RxID:   [id number]

## 2010-02-05 NOTE — Progress Notes (Signed)
Summary: TRIAGE  Phone Note Call from Patient Call back at Work Phone [phone] Call back at [phone]   Caller: Patient Call For: STARK Reason for Call: Talk to Nurse Details for Reason: TRIAGE Summary of Call: STOMACH PAINS/DISCOMFORT Initial call taken by: Guadlupe [facility],  August 12, 2007 12:50 PM  Follow-up for Phone Call        pt c/o excess pressure in abdomen describes as gas and bloating.  Says that she has lost 15 lbs since her last appt with [name] by eating high fiber, fresh fruit and vegetables  but very uncomfortable from the pressure.  Per last note was to hold black cohosh, her GYN has put her on femmrelle for menopause 1. try gas -x or phazyme with meals 2.  literature on gas and flatulence diet mailed to pt @ by [address] , [place] [address] 3.  Pt will call back if no improvment in symptoms with dietary modification and gas-x.    Follow-up by: Darcey [name] RN,  August 12, 2007 2:17 PM  Additional Follow-up for Phone Call Additional follow up Details #1::        Agree with above. May need to reduce the fiber in her diet as it may increase gas.  Additional Follow-up by: Meryl Dare MD Clementeen Graham,  August 12, 2007 2:19 PM

## 2010-02-05 NOTE — Progress Notes (Signed)
Summary: thyroid level drawn - dr Laury Axon  Phone Note Call from Patient Call back at Work Phone [phone]   Caller: Patient Summary of Call: patient saw dr Ladona Ridgel [phone] he suggested a thyroid level drawn he thought there is a thickkening in neck & patient has had a weight gain Initial call taken by: Okey Regal Spring,  August 04, 2006 10:15 AM  Follow-up for Phone Call            240.9, 780.79  tsh, cbcd, bmp, and since he felt her thyroid was big we will order a thyroid US as well--  We will call with that appt.  she can go ahead and schedule labs. Follow-up by: Loreen Freud DO,  August 04, 2006 11:03 AM  Additional Follow-up for Phone Call Additional follow up Details #1::        SPOKE WITH PT LABS ARE SCHED FOR 7/31 PT INFORMED WILL BE SET UP FOR US THYROID Additional Follow-up by: Kandice Hams,  August 04, 2006 12:00 PM  New Problems: MALAISE AND FATIGUE (ICD-780.79) GOITER NOS (ICD-240.9)   New Problems: MALAISE AND FATIGUE (ICD-780.79) GOITER NOS (ICD-240.9)

## 2010-02-05 NOTE — Assessment & Plan Note (Signed)
Summary: discuss bp med,cbs   Vital Signs:  Patient Profile:   55 Years Old Female Weight:      230.4 pounds Pulse rate:   76 / minute BP sitting:   130 / 86  (left arm)  Vitals Entered By: Shonna Chock (July 20, 2006 10:58 AM)               PCP:  Laury Axon  Chief Complaint:  1.)DISCUSS BLOOD PRESSURE MED(S) 2.)BACK PAIN-LOWER, 3.)DISCUSS LAP BAND, Fatigue, and Back pain.  History of Present Illness:  Hypertension Follow-Up      This is a 55 year old woman who presents for Hypertension follow-up.  The patient reports fatigue, but denies lightheadedness, urinary frequency, headaches, edema, impotence, and rash.  The patient denies the following associated symptoms: chest pain, chest pressure, exercise intolerance, dyspnea, palpitations, syncope, leg edema, and pedal edema.  Compliance with medications (by patient report) has been near 100%.  The patient reports that dietary compliance has been fair.  The patient reports no exercise.  Adjunctive measures currently used by the patient include salt restriction.    Fatigue      The patient also presents with Fatigue.  The patient reports persistent fatigue.  The patient denies fever, night sweats, weight loss, exertional chest pain, dyspnea, cough, hemoptysis, and new medications.  The patient denies the following symptoms: leg swelling, orthopnea, PND, melena, adenopathy, severe snoring, daytime sleepiness, and skin changes.  The patient denies anhedonia, feeling depressed, altered appetite, and poor sleep.    Back Pain      The patient also presents with Back pain.  The symptoms began duration > 3 days ago.  Pt c/0 back pain for 3 weeks.--  Minus [place] and bengay with no relief.  No injury.  The patient denies fever, chills, weakness, loss of sensation, fecal incontinence, urinary incontinence, urinary retention, dysuria, rest pain, inability to work, and inability to care for self.  The pain is located in the mid low back.  The pain began  gradually.  The pain is made better by inactivity and NSAID medications.  Pt has gained weight since being on metoprolol and knows thats when back started hurting.  Pt unable to give urine sample.  Current Allergies: No known allergies      Review of Systems      See HPI   Physical Exam  General:     Well-developed,well-nourished,in no acute distress; alert,appropriate and cooperative throughout examination Lungs:     Normal respiratory effort, chest expands symmetrically. Lungs are clear to auscultation, no crackles or wheezes. Heart:     Normal rate and regular rhythm. S1 and S2 normal without gallop, murmur, click, rub or other extra sounds. Abdomen:     Bowel sounds positive,abdomen soft and non-tender without masses, organomegaly or hernias noted. Extremities:     trace left pedal edema, 1+ left pedal edema, 2+ left pedal edema, and left pretibial edema.   Neurologic:     alert & oriented X3, strength normal in all extremities, sensation intact to light touch, gait normal, and DTRs symmetrical and normal.  SLR some pain with raising L leg, but = B/L Skin:     Intact without suspicious lesions or rashes Psych:     Oriented X3, memory intact for recent and remote, normally interactive, good eye contact, not anxious appearing, and not depressed appearing.  Pt feels very tired.    Impression & Recommendations:  Problem # 1:  HYPERTENSION (ICD-401.9)  Her updated medication list  for this problem includes:    Diovan Hct 160-12.5 Mg Tabs (Valsartan-hydrochlorothiazide) .[name]... Take 1 tablet by mouth once a day    Metoprolol Succinate 50 Mg Tb24 (Metoprolol succinate) .[name]... 1 once daily  BP today: 130/86  Labs Reviewed: Creat: 0.8 (03/25/2006) Chol: 127 (03/25/2006)   HDL: 40.7 (03/25/2006)   LDL: 57 (03/25/2006)   TG: 148 (03/25/2006)   Problem # 2:  ATRIAL FIBRILLATION (ICD-427.31) Pt will discuss fatigue on metoprolol with Dr. Ladona Ridgel Her updated medication list for  this problem includes:    Metoprolol Succinate 50 Mg Tb24 (Metoprolol succinate) .[name]... 1 once daily   Problem # 3:  OBESITY NOS (ICD-278.00) Assessment: Unchanged Discussed with pt, diet and exercise She will go to info mtg and [facility] this week--- Lap Band encouraged her to try [place] or Flat belly diet. RTO 1 montht  Problem # 4:  LOW BACK PAIN, MILD (ICD-724.2) Discussed use of moist heat or ice, modified activities, medications, and stretching/strengthening exercises. Back care instructions given. To be seen in 2 weeks if no improvement; sooner if worsening of symptoms.  Pt to take tylenol  arthritis Wt loss will help RTO if no better  Medications Added to Medication List This Visit: 1)  Metoprolol Succinate 50 Mg Tb24 (Metoprolol succinate) .[name].. 1 once daily 2)  One-a-day Menopause Health Tabs (Specialty vitamins products) .[name].. 1 once daily

## 2010-02-05 NOTE — Assessment & Plan Note (Signed)
Summary: sore throat,ear ache//fd   Vital Signs:  Patient profile:   55 year old female Weight:      227 pounds Temp:     98.4 degrees F oral Pulse rate:   86 / minute Pulse rhythm:   regular BP sitting:   140 / 82  (left arm) Cuff size:   large  Vitals Entered By: Army Fossa CMA (December 04, 2008 9:57 AM) CC: Having chest congestion still, refill on bp meds and check bp , URI symptoms   History of Present Illness:       This is a 54 year old woman who presents with URI symptoms.  The symptoms began 2 weeks ago.  Pt here c/o flu symptoms for 2 weeks.  Pt went to UC and was given inhler and amoxicillin.  R ear feels clogged and she is coughing.  The patient complains of nasal congestion, purulent nasal discharge, sore throat, productive cough, earache, and sick contacts, but denies clear nasal discharge and dry cough.  Associated symptoms include fever of 100.5-103 degrees.  The patient denies fever, low-grade fever (<100.5 degrees), fever of 103.1-104 degrees, fever to >104 degrees, stiff neck, dyspnea, wheezing, rash, vomiting, diarrhea, use of an antipyretic, and response to antipyretic.  The patient denies itchy watery eyes, itchy throat, sneezing, seasonal symptoms, response to antihistamine, headache, muscle aches, and severe fatigue.  The patient denies the following risk factors for Strep sinusitis: unilateral facial pain, unilateral nasal discharge, poor response to decongestant, double sickening, tooth pain, Strep exposure, tender adenopathy, and absence of cough.  Pt using asmanex and avelox and cough is no better.  Pt started meds on 11/28/08.  Current Medications (verified): 1)  Calcium .[name].. 1 Tablet By Mouth Once Daily 2)  Multivitamin .[name].. 1 Tablet By Mouth Once Daily 3)  Flaxseed Oil .[name].. 1 Tablet By Mouth Once Daily 4)  Exforge Hct 5-160-12.5 Mg Tabs (Amlodipine-Valsartan-Hctz) .[name].. 1 By Mouth Once Daily 5)  Premarin 0.3 Mg Tabs (Estrogens Conjugated) .[name].. 1 By Mouth  Once Daily 6)  Asmanex 30 Metered Doses 220 Mcg/inh Aepb (Mometasone Furoate) 7)  Avelox 400 Mg Tabs (Moxifloxacin Hcl) .[name].. 1 By Mouth Once Daily X 10 Days 8)  Astepro 0.15 % Soln (Azelastine Hcl) .... 2 Sprays Each Nostril Once Daily 9)  Veramyst 27.5 Mcg/spray Susp (Fluticasone Furoate) .... 2 Spray Each Nostril Once Daily 10)  Proair Hfa 108 (90 Base) Mcg/act Aers (Albuterol Sulfate) .... 2 Puffs Qid As Needed 11)  Symbicort 160-4.5 Mcg/act Aero (Budesonide-Formoterol Fumarate) .... 2 Puffs Two Times A Day  Allergies: 1)  ! Adhesive Bandages Fexible (Adhesive Bandages)  Past History:  Past Medical History: Last updated: 04/21/2008 Anemia-NOS Atrial fibrillation: PAF initially documented in 2005 and again in 07/2006 Headache Hypertension GERD Gastritis fatty Liver Diverticulosis Internal Hemorroids depression Sleep apnea COLON POLYPS  Past Surgical History: Last updated: 04/21/2008 Hysterectomy--partial Appendectomy Colonoscopy LAST DONE 5/09 Breast reduction TAH-->BSO cervical fusion  Family History: Last updated: 01/28/2008 Family History Diabetes 1st degree relative Family History of CAD Female 1st degree relative <60  Social History: Last updated: 01/28/2008 Married Never Smoked Alcohol use-no Occupation: [employer] Drug use-no Regular exercise-no  Risk Factors: Caffeine Use: 0 (01/28/2008) Exercise: no (01/28/2008)  Risk Factors: Smoking Status: never (06/25/2006) Passive Smoke Exposure: no (01/28/2008)  Family History: Reviewed history from 01/28/2008 and no changes required. Family History Diabetes 1st degree relative Family History of CAD Female 1st degree relative <60  Social History: Reviewed history from 01/28/2008 and no changes  required. Married Never Smoked Alcohol use-no Occupation: [employer] Drug use-no Regular exercise-no  Review of Systems      See HPI  Physical Exam  General:  Well-developed,well-nourished,in no acute  distress; alert,appropriate and cooperative throughout examination Ears:  TM dull--+ fluid  Nose:  External nasal examination shows no deformity or inflammation. Nasal mucosa are pink and moist without lesions or exudates. Mouth:  Oral mucosa and oropharynx without lesions or exudates.  Teeth in good repair. Lungs:  Normal respiratory effort, chest expands symmetrically. Lungs are clear to auscultation, no crackles or wheezes. Heart:  Normal rate and regular rhythm. S1 and S2 normal without gallop, murmur, click, rub or other extra sounds. Extremities:  No clubbing, cyanosis, edema, or deformity noted with normal full range of motion of all joints.   Skin:  Intact without suspicious lesions or rashes Cervical Nodes:  No lymphadenopathy noted Psych:  Cognition and judgment appear intact. Alert and cooperative with normal attention span and concentration. No apparent delusions, illusions, hallucinations   Impression & Recommendations:  Problem # 1:  BRONCHITIS- ACUTE (ICD-466.0)  Her updated medication list for this problem includes:    Asmanex 30 Metered Doses 220 Mcg/inh Aepb (Mometasone furoate)    Avelox 400 Mg Tabs (Moxifloxacin hcl) .[name]... 1 by mouth once daily x 10 days    Proair Hfa 108 (90 Base) Mcg/act Aers (Albuterol sulfate) .[name]... 2 puffs qid as needed    Symbicort 160-4.5 Mcg/act Aero (Budesonide-formoterol fumarate) .[name]... 2 puffs two times a day  Take antibiotics and other medications as directed. Encouraged to push clear liquids, get enough rest, and take acetaminophen as needed. To be seen in 5-7 days if no improvement, sooner if worse.  Complete Medication List: 1)  Calcium  .[name].. 1 tablet by mouth once daily 2)  Multivitamin  .[name].. 1 tablet by mouth once daily 3)  Flaxseed Oil  .[name].. 1 tablet by mouth once daily 4)  Exforge Hct 5-160-12.5 Mg Tabs (Amlodipine-valsartan-hctz) .[name].. 1 by mouth once daily 5)  Premarin 0.3 Mg Tabs (Estrogens conjugated) .[name].. 1 by mouth once  daily 6)  Asmanex 30 Metered Doses 220 Mcg/inh Aepb (Mometasone furoate) 7)  Avelox 400 Mg Tabs (Moxifloxacin hcl) .[name].. 1 by mouth once daily x 10 days 8)  Astepro 0.15 % Soln (Azelastine hcl) .... 2 sprays each nostril once daily 9)  Veramyst 27.5 Mcg/spray Susp (Fluticasone furoate) .... 2 spray each nostril once daily 10)  Proair Hfa 108 (90 Base) Mcg/act Aers (Albuterol sulfate) .... 2 puffs qid as needed 11)  Symbicort 160-4.5 Mcg/act Aero (Budesonide-formoterol fumarate) .... 2 puffs two times a day  Other Orders: Admin 1st Vaccine (84696) Flu Vaccine 35yrs + (29528) Prescriptions: SYMBICORT 160-4.5 MCG/ACT AERO (BUDESONIDE-FORMOTEROL FUMARATE) 2 puffs two times a day  #1 x 0   Entered and Authorized by:   Loreen Freud DO   Signed by:   Army Fossa CMA on 12/04/2008   Method used:   Historical   RxID:   [id number] ASTEPRO 0.15 % SOLN (AZELASTINE HCL) 2 sprays each nostril once daily  #1 x 0   Entered and Authorized by:   Loreen Freud DO   Signed by:   Army Fossa CMA on 12/04/2008   Method used:   Historical   RxID:   [id number] EXFORGE HCT 5-160-12.5 MG TABS (AMLODIPINE-VALSARTAN-HCTZ) 1 by mouth once daily  #90 x 0   Entered and Authorized by:   Loreen Freud DO   Signed by:   Loreen Freud DO on  12/04/2008   Method used:   Electronically to        [facility]* (mail-order)             ,          Ph: [phone]       Fax: [phone]   RxID:   [id number]  Flu Vaccine Consent Questions     Do you have a history of severe allergic reactions to this vaccine? no    Any prior history of allergic reactions to egg and/or gelatin? no    Do you have a sensitivity to the preservative Thimersol? no    Do you have a past history of Guillan-Barre Syndrome? no    Do you currently have an acute febrile illness? no    Have you ever had a severe reaction to latex? no    Vaccine information given and explained to patient? yes    Are you currently pregnant?  no    Lot Number:[id number]   Exp Date:07/05/2009   Site Given  Right Deltoid IM       Ph: [phone]       Fax: [phone]   RxID:   [id number]   .lbflu

## 2010-02-05 NOTE — Assessment & Plan Note (Signed)
Summary: cough no better//fd   Vital Signs:  Patient profile:   55 year old female Weight:      227 pounds O2 Sat:      97 % on Room air Temp:     99.5 degrees F oral Pulse rate:   86 / minute Pulse rhythm:   regular BP sitting:   124 / 80  (left arm) Cuff size:   large  Vitals Entered By: Army Fossa CMA (February 05, 2009 2:03 PM)  O2 Flow:  Room air CC: Pt c/o coughing a lot still, unable to sleep, having chills. , Cough   History of Present Illness:  Cough      This is a 55 year old woman who presents with Cough.  See last ov.  Pt had to reschedule pulm visit because work would not let her off.  The patient reports non-productive cough, wheezing, and fever, but denies productive cough, pleuritic chest pain, hemoptysis, and malaise.  Associated symtpoms include chronic rhinitis.  The patient denies the following symptoms: cold/URI symptoms, sore throat, nasal congestion, weight loss, acid reflux symptoms, and peripheral edema.  The cough is worse with activity and lying down.  Ineffective prior treatments have included OTC cough medication, albuterol inhaler, and other asthma medication.  Diagnostic testing to date has included CXR.  +  Pt felt better while on prednisone but when she went down to 1 a day her symptoms came back.    Current Medications (verified): 1)  Calcium .[name].. 1 Tablet By Mouth Once Daily 2)  Multivitamin .[name].. 1 Tablet By Mouth Once Daily 3)  Flaxseed Oil .[name].. 1 Tablet By Mouth Once Daily 4)  Exforge Hct 5-160-12.5 Mg Tabs (Amlodipine-Valsartan-Hctz) .[name].. 1 By Mouth Once Daily 5)  Premarin 0.3 Mg Tabs (Estrogens Conjugated) .[name].. 1 By Mouth Once Daily 6)  Symbicort 160-4.5 Mcg/act Aero (Budesonide-Formoterol Fumarate) .... 2 Puffs Two Times A Day 7)  Astepro 0.15 % Soln (Azelastine Hcl) .... 2 Sprays Each Nostril Once Daily 8)  Veramyst 27.5 Mcg/spray Susp (Fluticasone Furoate) .... 2 Spray Each Nostril Once Daily 9)  Proair Hfa 108 (90 Base) Mcg/act Aers  (Albuterol Sulfate) .... 2 Puffs Qid As Needed 10)  Zithromax Z-Pak 250 Mg Tabs (Azithromycin) .... As Directed 11)  Prednisone 10 Mg Tabs (Prednisone) .... 3 By Mouth Once Daily For 3 Days Then 2 By Mouth Once Daily For 3 Days Then 1 By Mouth Once Daily For 3 Days 12)  Cheratussin Ac 100-10 Mg/28ml Syrp (Guaifenesin-Codeine) .[name].. 1-2 Tsp By Mouth At Bedtime As Needed  Allergies: 1)  ! Adhesive Bandages Fexible (Adhesive Bandages)  Past History:  Family History: Last updated: 01/28/2008 Family History Diabetes 1st degree relative Family History of CAD Female 1st degree relative <60  Social History: Last updated: 01/28/2008 Married Never Smoked Alcohol use-no Occupation: [employer] Drug use-no Regular exercise-no  Risk Factors: Caffeine Use: 0 (01/28/2008) Exercise: no (01/28/2008)  Risk Factors: Smoking Status: never (06/25/2006) Passive Smoke Exposure: no (01/28/2008)  Past medical, surgical, family and social histories (including risk factors) reviewed for relevance to current acute and chronic problems.  Past Medical History: Reviewed history from 01/23/2009 and no changes required. Anemia-NOS Atrial fibrillation: PAF initially documented in 2005 and again in 07/2006 Headache Hypertension GERD Gastritis fatty Liver Diverticulosis Internal Hemorroids depression Sleep apnea COLON POLYPS Asthma  Past Surgical History: Reviewed history from 04/21/2008 and no changes required. Hysterectomy--partial Appendectomy Colonoscopy LAST DONE 5/09 Breast reduction TAH-->BSO cervical fusion  Family History: Reviewed history from  01/28/2008 and no changes required. Family History Diabetes 1st degree relative Family History of CAD Female 1st degree relative <60  Social History: Reviewed history from 01/28/2008 and no changes required. Married Never Smoked Alcohol use-no Occupation: [employer]. Drug use-no Regular exercise-no  Review of Systems      See  HPI  Physical Exam  General:  alert and uncomfortable-appearing.   Ears:  External ear exam shows no significant lesions or deformities.  Otoscopic examination reveals clear canals, tympanic membranes are intact bilaterally without bulging, retraction, inflammation or discharge. Hearing is grossly normal bilaterally. Nose:  no external deformity, mucosal erythema, and mucosal edema.   Mouth:  Oral mucosa and oropharynx without lesions or exudates.  Teeth in good repair. Neck:  No deformities, masses, or tenderness noted. Lungs:  + exp wheeze with exp + scattered rhonci Heart:  normal rate and no murmur.   Psych:  Oriented X3 and normally interactive.     Impression & Recommendations:  Problem # 1:  BRONCHITIS- ACUTE (ICD-466.0)  Her updated medication list for this problem includes:    Symbicort 160-4.5 Mcg/act Aero (Budesonide-formoterol fumarate) .[name]... 2 puffs two times a day    Proair Hfa 108 (90 Base) Mcg/act Aers (Albuterol sulfate) .[name]... 2 puffs qid as needed    Zithromax Z-pak 250 Mg Tabs (Azithromycin) .[name]... As directed    Cheratussin Ac 100-10 Mg/13ml Syrp (Guaifenesin-codeine) .[name]... 1-2 tsp by mouth at bedtime as needed  Take antibiotics and other medications as directed. Encouraged to push clear liquids, get enough rest, and take acetaminophen as needed. To be seen in 5-7 days if no improvement, sooner if worse.  Problem # 2:  CHEST DISCOMFORT (ICD-786.59)  Orders: T-D-Dimer Fibrin Derivatives Quantitive (32951-88416) TLB-BMP (Basic Metabolic Panel-BMET) (80048-METABOL) TLB-CBC Platelet - w/Differential (85025-CBCD)  Problem # 3:  COUGH (ICD-786.2)  Complete Medication List: 1)  Calcium  .[name].. 1 tablet by mouth once daily 2)  Multivitamin  .[name].. 1 tablet by mouth once daily 3)  Flaxseed Oil  .[name].. 1 tablet by mouth once daily 4)  Exforge Hct 5-160-12.5 Mg Tabs (Amlodipine-valsartan-hctz) .[name].. 1 by mouth once daily 5)  Premarin 0.3 Mg Tabs (Estrogens conjugated) .[name].. 1  by mouth once daily 6)  Symbicort 160-4.5 Mcg/act Aero (Budesonide-formoterol fumarate) .... 2 puffs two times a day 7)  Astepro 0.15 % Soln (Azelastine hcl) .... 2 sprays each nostril once daily 8)  Veramyst 27.5 Mcg/spray Susp (Fluticasone furoate) .... 2 spray each nostril once daily 9)  Proair Hfa 108 (90 Base) Mcg/act Aers (Albuterol sulfate) .... 2 puffs qid as needed 10)  Zithromax Z-pak 250 Mg Tabs (Azithromycin) .... As directed 11)  Prednisone 10 Mg Tabs (Prednisone) .... 3 by mouth once daily for 3 days then 2 by mouth once daily for 3 days then 1 by mouth once daily for 3 days 12)  Cheratussin Ac 100-10 Mg/20ml Syrp (Guaifenesin-codeine) .[name].. 1-2 tsp by mouth at bedtime as needed  Other Orders: Albuterol Sulfate Sol 1mg  unit dose (S0630) Nebulizer Tx (16010) Admin of Therapeutic Inj  intramuscular or subcutaneous (93235) Depo- Medrol 80mg  (J1040) Venipuncture (57322) Prescriptions: CHERATUSSIN AC 100-10 MG/5ML SYRP (GUAIFENESIN-CODEINE) 1-2 tsp by mouth at bedtime as needed  #6 oz x 0   Entered and Authorized by:   Loreen Freud DO   Signed by:   Loreen Freud DO on 02/05/2009   Method used:   Print then Give to Patient   RxID:   [id number] PREDNISONE 10 MG TABS (PREDNISONE) 3 by mouth once  daily for 3 days then 2 by mouth once daily for 3 days then 1 by mouth once daily for 3 days  #18 x 0   Entered and Authorized by:   Loreen Freud DO   Signed by:   Loreen Freud DO on 02/05/2009   Method used:   Electronically to        [facility]. [address] [phone]* (retail)       [address]       [place], [place]  [address]       Ph: [phone] or [phone]       Fax: [phone]   RxID:   [id number] ZITHROMAX Z-PAK 250 MG TABS (AZITHROMYCIN) as directed  #1 x 0   Entered and Authorized by:   Loreen Freud DO   Signed by:   Loreen Freud DO on 02/05/2009   Method used:   Electronically to        [facility]. [address] [phone]* (retail)       [address]       [place], [place]  [address]       Ph: [phone] or [phone]       Fax: [phone]   RxID:   [id number]    Medication Administration  Injection # 1:    Medication: Depo- Medrol 80mg     Diagnosis: ASTHMA (ICD-493.90)    Route: IM    Site: RUOQ gluteus    Exp Date: 10/2009    Lot #: [id number]    Mfr: novaplus    Patient tolerated injection without complications    Given by: Army Fossa CMA (February 05, 2009 2:34 PM)  Medication # 1:    Medication: Albuterol Sulfate Sol 1mg  unit dose    Diagnosis: ASTHMA (ICD-493.90)  Orders Added: 1)  Albuterol Sulfate Sol 1mg  unit dose [J7613] 2)  Nebulizer Tx [94640] 3)  Admin of Therapeutic Inj  intramuscular or subcutaneous [96372] 4)  Depo- Medrol 80mg  [J1040] 5)  Est. Patient Level IV [15176] 6)  Venipuncture [16073] 7)  T-D-Dimer Fibrin Derivatives Quantitive [71062-69485] 8)  TLB-BMP (Basic Metabolic Panel-BMET) [80048-METABOL] 9)  TLB-CBC Platelet - w/Differential [85025-CBCD]   Laboratory Results  Date/Time Received: February 05, 2009 2:47 PM  Date/Time Reported: February 05, 2009 2:47 PM   Other Tests  Influenza A: negative Influenza B: negative

## 2010-02-05 NOTE — Progress Notes (Signed)
Summary: exforge refill--pt is out of meds  Phone Note Refill Request Call back at Work Phone [phone] Message from:  Patient on November 12, 2009 10:01 AM  Refills Requested: Medication #1:  EXFORGE 5-160 MG TABS tAKE 1 tab once daily patient is out of medication, please call it in to  [facility] on [address],----patient has appointment for Wed 11/09  Initial call taken by: Jerolyn Shin,  November 12, 2009 10:02 AM    Prescriptions: EXFORGE 5-160 MG TABS (AMLODIPINE BESYLATE-VALSARTAN) tAKE 1 tab once daily  #90 x 1   Entered by:   Almeta Monas CMA (AAMA)   Authorized by:   Loreen Freud DO   Signed by:   Almeta Monas CMA (AAMA) on 11/12/2009   Method used:   Faxed to ...       [facility] [address] [phone]* (retail)       [address]       [place], [place]  [address]       Ph: [phone] or [phone]       Fax: [phone]   RxID:   [id number]

## 2010-02-05 NOTE — Progress Notes (Signed)
Summary: PHONE NOTE RE: NECK U/S  Phone Note Outgoing Call   Call placed by: Shonna Chock,  August 18, 2006 8:18 AM Details for Reason: DISCUSS NECK U/S Summary of Call: LM WITH FEMALE TO HAVE PATIENT RETURN CALL REASON FOR CALL WAS TO TELL PATIENT THAT HER REPORT SHOWED SOME NODULES ON THYROID AND WE WILL REFER TO A SPECIALIST (ENDO) FOR FUTHER EVAULUATION  Follow-up for Phone Call        SPOKE WITH PATIENT . DISCUSSED THAT REFERRAL COOR. WILL CONTACT HER Follow-up by: Shonna Chock,  August 18, 2006 9:49 AM

## 2010-02-05 NOTE — Progress Notes (Signed)
Summary: [facility]--rx  Phone Note Refill Request   Refills Requested: Medication #1:  DIOVAN HCT 320-12.5 MG TABS 1 by mouth once daily [facility]--[phone]  Initial call taken by: Freddy Jaksch,  February 17, 2008 2:37 PM      Prescriptions: DIOVAN HCT 320-12.5 MG TABS (VALSARTAN-HYDROCHLOROTHIAZIDE) 1 by mouth once daily  #90 x 3   Entered by:   Kandice Hams   Authorized by:   Loreen Freud DO   Signed by:   Kandice Hams on 02/17/2008   Method used:   Re-Faxed to ...       [facility]* (mail-order)             ,          Ph: [phone]       Fax: [phone]   RxID:   [id number]

## 2010-02-05 NOTE — Assessment & Plan Note (Signed)
Summary: abdominal discomfort and nausea/sheri    History of Present Illness Visit Type: Follow-up Visit Primary GI MD: Elie Goody MD [facility] Primary Provider: Loreen Freud, MD Chief Complaint: abdomina.l discomfort and nausea History of Present Illness:   55 YO [name] KNOWN TO [name]  WITH HX OF COLON POLYPS AND DIVERTICULOSIS,HAS ALSO BEEN SEEN FOR GASTRITIS IN THE PAST. LAST COLON5/09 SHOWED A SMALL HYPERPLASTIC POLYP,TRANSVERSE COLON LIPOMA AND LEFT SIDED DIVERTICULOSIS. SHE COMES IN TODAY AS AN ADD ON WITH C/O 3-4 DAYS OF LUQ PAIN. THIS IS CONSTANT,PRESSURE TYPE PAIN,ACHING. SHE HAS HAD MILD NAUSEA,LESS FREQUENT STOOLS. PAIN IS WORSE POST PRANDIALLY. BMS GENERALLY LOOSE, NO MELENA OR HEME.Marland KitchenHAD BEEN ON ONE ASA once daily(81),NO NSAIDS.,NO ETOH,NO NEW MEDS.NO FEVER.   GI Review of Systems    Reports abdominal pain, bloating, loss of appetite, and  nausea.     Location of  Abdominal pain: LUQ.    Denies acid reflux, belching, chest pain, dysphagia with liquids, dysphagia with solids, heartburn, vomiting, vomiting blood, and  weight loss.      Reports change in bowel habits and  diverticulosis.     Denies anal fissure, black tarry stools, heme positive stool, hemorrhoids, irritable bowel syndrome, jaundice, light color stool, liver problems, rectal bleeding, and  rectal pain.    Current Medications (verified): 1)  Calcium .[name].. 1 Tablet By Mouth Once Daily 2)  Multivitamin .[name].. 1 Tablet By Mouth Once Daily 3)  Flaxseed Oil .[name].. 1 Tablet By Mouth Once Daily 4)  Diovan Hct 320-12.5 Mg Tabs (Valsartan-Hydrochlorothiazide) .[name].. 1 Tablet By Mouth Once Daily  Allergies (verified): 1)  ! Adhesive Bandages Fexible (Adhesive Bandages)  Past History:  Past Medical History:    Anemia-NOS    Atrial fibrillation: PAF initially documented in 2005 and again in 07/2006    Headache    Hypertension    GERD    Gastritis    fatty Liver    Diverticulosis    Internal Hemorroids     depression    Sleep apnea    COLON POLYPS  Past Surgical History:    Hysterectomy--partial    Appendectomy    Colonoscopy LAST DONE 5/09    Breast reduction    TAH-->BSO    cervical fusion  Review of Systems  The patient denies allergy/sinus, anemia, anxiety-new, arthritis/joint pain, back pain, blood in urine, breast changes/lumps, change in vision, confusion, cough, coughing up blood, depression-new, fainting, fatigue, fever, headaches-new, hearing problems, heart murmur, heart rhythm changes, itching, menstrual pain, muscle pains/cramps, night sweats, nosebleeds, pregnancy symptoms, shortness of breath, skin rash, sleeping problems, sore throat, swelling of feet/legs, swollen lymph glands, thirst - excessive , urination - excessive , urination changes/pain, urine leakage, vision changes, and voice change.    Vital Signs:  Patient profile:   55 year old [name] Height:      65 inches Weight:      228.25 pounds BMI:     38.12 Pulse rate:   84 / minute Pulse rhythm:   regular BP sitting:   140 / 88  (left arm)  Vitals Entered By: Milford Cage CMA (April 21, 2008 8:53 AM)  Physical Exam  General:  Well developed, well nourished, no acute distress. Head:  Normocephalic and atraumatic. Eyes:  PERRLA, no icterus. Neck:  Supple; no masses or thyromegaly. Lungs:  Clear throughout to auscultation. Heart:  Regular rate and rhythm; no murmurs, rubs,  or bruits. Abdomen:  SOFT,TENDER LUQ,LMQ AND LLQ,NO GUARDING, NO REBOUND,BS+,NO MASS OR HSM Rectal:  NOT DONE Neurologic:  Alert and  oriented x4;  grossly normal neurologically.   Impression & Recommendations:  Problem # 1:  ABDOMINAL PAIN-LUQ (ICD-789.02) Assessment New 55 YO [name]  WITH 3 DAY HX OF LUQ/LMQ ABDOMINAL PAIN,SUSPECT DIVERTICULITIS.  CBC,HEPATIC PANEL,LIPASE TODAY BLAND SOFT DIET NEXT 3-4 DAYS CIPRO 500 MG TWICE DAILY X 2 WEEKS FLAGYL 500 two times a day X 2 WEEKS SHORT COURSE OF EMPIRIC PPI,NEXIUM 40 MG DAILY  (SAMPLES GIVEN) FOLLOW UP WITH [name] IN 2-3 WEEKS PT ADVISED TO CALL BACK IN 3-4 DAYS,IF NO BETTER WILL NEED CT ABD.  Problem # 2:  PERSONAL HX COLONIC POLYPS (ICD-V12.72) Assessment: Comment Only DUE FOR F/U 2014  Problem # 3:  FATTY LIVER DISEASE (ICD-571.8) Assessment: Unchanged PT AWARE SHE IS TO BE ON LOW FAT/LOW CHOLESTEROL DIET HEPATIC PANEL TODAY  Other Orders: TLB-CBC Platelet - w/Differential (85025-CBCD) TLB-Hepatic/Liver Function Pnl (80076-HEPATIC) TLB-Lipase (83690-LIPASE)  Patient Instructions: 1)  Soft bland diet  for the next 3-4 days. 2)  Please go to our lab. 3)  We have sent electronically to your pharmacy prescriptions for Cipro and Flagyl.  4)  Please call Pam @ [phone] on Mon 04-24-08 if you are not significantly better. 5)  Copy sent to : Loreen Freud, MD 6)  The medication list was reviewed and reconciled.  All changed / newly prescribed medications were explained.  A complete medication list was provided to the patient / caregiver. Prescriptions: FLAGYL 500 MG TABS (METRONIDAZOLE) Take 1 tab two times a day x 14 days  #28 x 0   Entered by:   Lowry Ram CMA   Authorized by:   Sammuel Cooper PA-c   Signed by:   Lowry Ram CMA on 04/21/2008   Method used:   Electronically to        [facility] [phone]* (retail)       [address]       [place], [place]  [address]       Ph: [phone]       Fax: [phone]   RxID:   [id number] CIPRO 500 MG TABS (CIPROFLOXACIN HCL) Take 1 tab two times a day x 14 days  #28 x 0   Entered by:   Lowry Ram CMA   Authorized by:   Sammuel Cooper PA-c   Signed by:   Lowry Ram CMA on 04/21/2008   Method used:   [facility] to        [facility] [phone]* (retail)       [address]       [place], [place]  [address]       Ph: [phone]       Fax: [phone]   RxID:   [id number]

## 2010-02-05 NOTE — Assessment & Plan Note (Signed)
Summary: NOV: LUQ pain   Vital Signs:  Patient profile:   55 year old female Height:      65 inches Weight:      225 pounds Temp:     97.9 degrees F oral Pulse rate:   81 / minute BP sitting:   139 / 77  (left arm) Cuff size:   large  Vitals Entered By: Kathlene November (March 09, 2009 9:41 AM) CC: pain in upper left quad radiating into back- very painful after eating 10/10 on pain scale Is Patient Diabetic? No   Primary Care Provider:  Loreen Freud DO  CC:  pain in upper left quad radiating into back- very painful after eating 10/10 on pain scale.  History of Present Illness: Luq pain started about 5 days ago, now radiating towards her back.  Worse after she eats.  Never had this before. No hx of pacreas problems.  Seeing Dr. [name]  GI for similar pain on and off for a year. Has never been this bad before.  Feels it is now hard to breath.  More loose stools.  Had a colonoscopy at end of 2009. Mom with hx GERD.  No sxs of GERd or reflux. + having brash. No nausea.  Does drink alot of water. Eating an orage really made it worse. Is taking pepcid prn. Took one last  and did help.  No vomiting.  No hx of kidney stones.  No dysuria and no frequently. No dysphagia.   Habits & Providers  Alcohol-Tobacco-Diet     Alcohol drinks/day: 0     Tobacco Status: never  Exercise-Depression-Behavior     Does Patient Exercise: no     STD Risk: never     Drug Use: never     Seat Belt Use: always  Current Medications (verified): 1)  Calcium .[name].. 1 Tablet By Mouth Once Daily 2)  Multivitamins   Tabs (Multiple Vitamin) .... Take 1 Tablet By Mouth Once A Day 3)  Exforge Hct 5-160-12.5 Mg Tabs (Amlodipine-Valsartan-Hctz) .[name].. 1 By Mouth Once Daily 4)  Premarin 0.3 Mg Tabs (Estrogens Conjugated) .[name].. 1 By Mouth Once Daily 5)  Pepcid 20 Mg Tabs (Famotidine) .... Take 1 Tablet By Mouth Once A Day 6)  Advair Diskus 500-50 Mcg/dose Aepb (Fluticasone-Salmeterol) .... Two Times A Day  Allergies  (verified): 1)  ! Adhesive Bandages Fexible (Adhesive Bandages)  Comments:  Nurse/Medical Assistant: The patient's medications and allergies were reviewed with the patient and were updated in the Medication and Allergy Lists. Kathlene November (March 09, 2009 9:43 AM)  Past History:  Past Medical History: Last updated: 01/23/2009 Anemia-NOS Atrial fibrillation: PAF initially documented in 2005 and again in 07/2006 Headache Hypertension GERD Gastritis fatty Liver Diverticulosis Internal Hemorroids depression Sleep apnea COLON POLYPS Asthma  Past Surgical History: Last updated: 04/21/2008 Hysterectomy--partial Appendectomy Colonoscopy LAST DONE 5/09 Breast reduction TAH-->BSO cervical fusion  Family History: Last updated: 01/28/2008 Family History Diabetes 1st degree relative Family History of CAD Female 1st degree relative <60  Social History: Last updated: 02/13/2009 Married Never Smoked Alcohol use-no Occupation: [employer] 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  + vison changes.  No difficulty hearing/ringing in ears, hay fever/allergies.  No chest pain/discomfort, palpitations.  No Br lump/nipple discharge.  + cough/wheeze.  No blood in BM, nausea/vomiting/diarrhea.  No  nighttime urination, leaking urine, unusual vaginal bleeding, discharge (penis or vagina).  No muscle/joint pain. No rash, change in mole.  + HA, no memory loss.  No anxiety, + sleep d/o, no depression.  No easy bruising/bleeding, unexplained lump   Physical Exam  General:  Well-developed,well-nourished,in no acute distress; alert,appropriate and cooperative throughout examination Head:  Normocephalic and atraumatic without obvious abnormalities. No apparent alopecia or balding. Eyes:  No corneal or conjunctival inflammation noted. EOMI.  Perrla.  Ears:  External ear exam shows no significant lesions or deformities.  Otoscopic examination reveals clear canals, tympanic membranes are intact bilaterally without bulging, retraction, inflammation or discharge. Hearing is grossly normal bilaterally. Nose:  External nasal examination shows no deformity or inflammation.  Mouth:  Oral mucosa and oropharynx without lesions or exudates.  Teeth in good repair. Neck:  No deformities, masses, or tenderness noted. Lungs:  Normal respiratory effort, chest expands symmetrically. Lungs are clear to auscultation, no crackles or wheezes. Heart:  Normal rate and regular rhythm. S1 and S2 normal without gallop, murmur, click, rub or other extra sounds. Abdomen:  Tender over the epigastrum adn teh LUQ.  normal bowel sounds, no distention, no masses, no guarding, no hepatomegaly, and no splenomegaly.   Skin:  no rashes.   Cervical Nodes:  No lymphadenopathy noted Psych:  Cognition and judgment appear intact. Alert and cooperative with normal attention span and concentration. No apparent delusions, illusions, hallucinations   Impression & Recommendations:  Problem # 1:  ABDOMINAL PAIN-LUQ (ICD-789.02) ONly drink liquids over the weekend, no solid foods. I am concerned about pancreatitis. Will send for labs this AM.  Given samples of Dexliant to take instead of pepcid for sxs control. Call if not better by Monday.  UA + for trace blood so will send for micro for further evaluation but I really don't think this is kidney stones.  Orders: UA Dipstick w/o Micro (automated)  (81003) T-Urine Microscopic (96045-40981) T-Comprehensive Metabolic Panel [phone]) T-CBC w/Diff (21308-65784) T-Amylase ([phone]) T-Lipase (32440-10272)  Complete Medication List: 1)  Calcium  .[name].. 1 tablet by mouth once daily 2)  Multivitamins Tabs (Multiple vitamin) .... Take 1 tablet by mouth once a day 3)  Exforge Hct 5-160-12.5 Mg Tabs (Amlodipine-valsartan-hctz)  .[name].. 1 by mouth once daily 4)  Premarin 0.3 Mg Tabs (Estrogens conjugated) .[name].. 1 by mouth once daily 5)  Pepcid 20 Mg Tabs (Famotidine) .... Take 1 tablet by mouth once a day 6)  Advair Diskus 500-50 Mcg/dose Aepb (Fluticasone-salmeterol) .... Two times a day  Laboratory Results   Urine Tests  Date/Time Received: 03/09/2009 Date/Time Reported: 03/09/2009  Routine Urinalysis   Color: yellow Appearance: Cloudy Glucose: negative   (Normal Range: Negative) Bilirubin: negative   (Normal Range: Negative) Ketone: trace (5)   (Normal Range: Negative) Spec. Gravity: 1.015   (Normal Range: 1.003-1.035) Blood: trace-intact   (Normal Range: Negative) pH: 6.0   (Normal Range: 5.0-8.0) Protein: trace   (Normal Range: Negative) Urobilinogen: 0.2   (Normal Range: 0-1) Nitrite: negative   (Normal Range: Negative) Leukocyte Esterace: negative   (Normal Range: Negative)         Orders Added: 1)  UA Dipstick w/o Micro (automated)  [81003] 2)  T-Urine Microscopic [[id number]] 3)  T-Comprehensive Metabolic Panel [80053-22900] 4)  T-CBC w/Diff [42595-63875] 5)  T-Amylase [82150-23210] 6)  T-Lipase [83690-23215] 7)  New Patient Level III [64332]  Flex Sig Next Due:  Not Indicated Hemoccult Next Due:  Not Indicated Last Mammogram:  Normal Bilateral (07/21/2007 1:43:42 PM) Mammogram Result  Date:  07/06/2008 Mammogram Result:  normal Mammogram Next Due:  1 yr

## 2010-02-05 NOTE — Progress Notes (Signed)
Summary: NEED RX TO [facility]//  Phone Note Call from Patient   Caller: Patient Summary of Call: PT CALLED AND LEFT MSG NEED RX FOPR EXFORGE SENT TO [facility] Initial call taken by: Kandice Hams,  August 15, 2008 10:05 AM    Prescriptions: EXFORGE HCT 5-160-12.5 MG TABS (AMLODIPINE-VALSARTAN-HCTZ) 1 by mouth once daily  #90 x 0   Entered by:   Kandice Hams   Authorized by:   Loreen Freud DO   Signed by:   Kandice Hams on 08/15/2008   Method used:   Re-Faxed to ...       [facility]* (mail-order)             ,          Ph: [phone]       Fax: [phone]   RxID:   [id number]

## 2010-02-05 NOTE — Letter (Signed)
Summary: External Correspondence--OFFICE VISIT  External Correspondence--OFFICE VISIT   Imported By: Freddy Jaksch 07/15/2006 12:41:17  _____________________________________________________________________  External Attachment:    Type:   Image     Comment:   OFFICE VISIT

## 2010-02-05 NOTE — Progress Notes (Signed)
Summary: Need refills to new pharmacy  Phone Note Refill Request Message from:  Patient on April 10, 2009 2:51 PM  Refills Requested: Medication #1:  EXFORGE HCT 5-160-12.5 MG TABS 1 by mouth once daily [facility] [address] in [place].  Please call [phone] or [phone] when completed   Method Requested: Telephone to Pharmacy Next Appointment Scheduled: No future appts. Initial call taken by: Barnie Mort,  April 10, 2009 2:52 PM    Prescriptions: EXFORGE HCT 5-160-12.5 MG TABS (AMLODIPINE-VALSARTAN-HCTZ) 1 by mouth once daily  #90 x 0   Entered by:   Army Fossa CMA   Authorized by:   Loreen Freud DO   Signed by:   Army Fossa CMA on 04/10/2009   Method used:   Electronically to        [facility] [address] [phone]* (retail)       [address]       [place], [place]  [address]       Ph: [phone] or [phone]       Fax: [phone]   RxID:   [id number]   Appended Document: Need refills to new pharmacy Needs to go to diff pharm- [facility] [address]   Prescriptions: EXFORGE HCT 5-160-12.5 MG TABS (AMLODIPINE-VALSARTAN-HCTZ) 1 by mouth once daily  #90 x 0   Entered by:   Army Fossa CMA   Authorized by:   Loreen Freud DO   Signed by:   Army Fossa CMA on 04/10/2009   Method used:   Electronically to        [facility] [phone]* (retail)       [address] [place], [place]  [address]       Ph: [phone]       Fax: [phone]   RxID:   [id number]

## 2010-02-05 NOTE — Assessment & Plan Note (Signed)
Summary: MED REFILL/CBS   Vital Signs:  Patient profile:   55 year old female Height:      65 inches (165.10 cm) Weight:      222.13 pounds (100.97 kg) BMI:     37.10 Temp:     98.0 degrees F (36.67 degrees C) oral BP sitting:   130 / 84  (left arm) Cuff size:   large  Vitals Entered By: Lucious Groves CMA (August 06, 2009 12:59 PM) CC: Med refill and pt c/o high BP and back pain./kb, Back Pain Is Patient Diabetic? No Pain Assessment Patient in pain? yes     Location: back Intensity: 5 Type: aching Comments Patient notes that her BP was previously 144/90./kb   History of Present Illness:  Hypertension follow-up      This is a 55 year old woman who presents for Hypertension follow-up.  Pt has been under a lot of stress and bp has been running high.  The patient denies lightheadedness, urinary frequency, headaches, edema, impotence, rash, and fatigue.  The patient denies the following associated symptoms: chest pain, chest pressure, exercise intolerance, dyspnea, palpitations, syncope, leg edema, and pedal edema.  Compliance with medications (by patient report) has been near 100%.  The patient reports that dietary compliance has been fair.  The patient reports exercising daily.  Adjunctive measures currently used by the patient include salt restriction.         The patient also presents with Back Pain.  The symptoms began 3 months ago.  Pt has been picking up grandson a lot and recently moved.  Her low back has been bothering her since.  The patient denies fever, chills, weakness, loss of sensation, fecal incontinence, urinary incontinence, urinary retention, dysuria, rest pain, inability to work, and inability to care for self.  The pain is located in the left low back.  The pain began gradually and after lifting.  The pain radiates to the left hip.  The pain is made worse by lying down and activity.  The pain is made better by inactivity, NSAID medications, sitting or bending forward,  heat, and ice.  Risk factors for serious underlying conditions include duration of pain > 1 month.    Current Medications (verified): 1)  Calcium Carbonate-Vitamin D 600-400 Mg-Unit  Tabs (Calcium Carbonate-Vitamin D) .... Take 1 Tablet By Mouth Once A Day 2)  Multivitamins   Tabs (Multiple Vitamin) .... Take 1 Tablet By Mouth Once A Day 3)  Exforge Hct 5-160-25 Mg Tabs (Amlodipine-Valsartan-Hctz) .[name].. 1 By Mouth Once Daily 4)  Premarin 0.3 Mg Tabs (Estrogens Conjugated) .[name].. 1 By Mouth Once Daily 5)  Fluticasone Propionate 50 Mcg/act Susp (Fluticasone Propionate) .... 2 Puffs  Two Times A Day 6)  Zyrtec Allergy 10 Mg Caps (Cetirizine Hcl) .[name].. 1 By Mouth At Bedtime 7)  Vicodin Es 7.5-750 Mg Tabs (Hydrocodone-Acetaminophen) .[name].. 1 By Mouth Every 6 Hours As Needed 8)  Flexeril 10 Mg Tabs (Cyclobenzaprine Hcl) .[name].. 1 By Mouth Three Times A Day As Needed  Allergies (verified): 1)  ! Adhesive Bandages Fexible (Adhesive Bandages)  Past History:  Past medical, surgical, family and social histories (including risk factors) reviewed for relevance to current acute and chronic problems.  Past Medical History: Reviewed history from 01/23/2009 and no changes required. Anemia-NOS Atrial fibrillation: PAF initially documented in 2005 and again in 07/2006 Headache Hypertension GERD Gastritis fatty Liver Diverticulosis Internal Hemorroids depression Sleep apnea COLON POLYPS Asthma  Past Surgical History: Reviewed history from 04/21/2008 and no  changes required. Hysterectomy--partial Appendectomy Colonoscopy LAST DONE 5/09 Breast reduction TAH-->BSO cervical fusion  Family History: Reviewed history from 01/28/2008 and no changes required. Family History Diabetes 1st degree relative Family History of CAD Female 1st degree relative <60  Social History: Reviewed history from 02/13/2009 and no changes required. Married Never Smoked Alcohol use-no Occupation: [employer] Drug  use-no Regular exercise-no 2 children  Review of Systems      See HPI  Physical Exam  General:  Well-developed,well-nourished,in no acute distress; alert,appropriate and cooperative throughout examinationoverweight-appearing.   Neck:  No deformities, masses, or tenderness noted. Lungs:  Normal respiratory effort, chest expands symmetrically. Lungs are clear to auscultation, no crackles or wheezes. Heart:  normal rate and no murmur.   Msk:  normal ROM, no joint tenderness, no joint swelling, no joint warmth, no redness over joints, no joint deformities, no joint instability, and no crepitation.   Extremities:  No clubbing, cyanosis, edema, or deformity noted with normal full range of motion of all joints.   Neurologic:  strength normal in all extremities, gait normal, and DTRs symmetrical and normal.   Cervical Nodes:  No lymphadenopathy noted Psych:  Cognition and judgment appear intact. Alert and cooperative with normal attention span and concentration. No apparent delusions, illusions, hallucinations   Impression & Recommendations:  Problem # 1:  BACK PAIN, LUMBAR (ICD-724.2)  Her updated medication list for this problem includes:    Vicodin Es 7.5-750 Mg Tabs (Hydrocodone-acetaminophen) .[name]... 1 by mouth every 6 hours as needed    Flexeril 10 Mg Tabs (Cyclobenzaprine hcl) .[name]... 1 by mouth three times a day as needed  Orders: T-Lumbar Spine 2 Views (72100TC)  Discussed use of moist heat or ice, modified activities, medications, and stretching/strengthening exercises. Back care instructions given. To be seen in 2 weeks if no improvement; sooner if worsening of symptoms.   Problem # 2:  HYPERTENSION (ICD-401.9)  Her updated medication list for this problem includes:    Exforge Hct 5-160-25 Mg Tabs (Amlodipine-valsartan-hctz) .[name]... 1 by mouth once daily  BP today: 130/84 Prior BP: 150/78 (04/02/2009)  Labs Reviewed: K+: 4.1 (03/09/2009) Creat: : 0.80 (03/09/2009)   Chol: 135  (12/13/2007)   HDL: 39.4 (12/13/2007)   LDL: 73 (12/13/2007)   TG: 111 (12/13/2007)  Complete Medication List: 1)  Calcium Carbonate-vitamin D 600-400 Mg-unit Tabs (Calcium carbonate-vitamin d) .... Take 1 tablet by mouth once a day 2)  Multivitamins Tabs (Multiple vitamin) .... Take 1 tablet by mouth once a day 3)  Exforge Hct 5-160-25 Mg Tabs (Amlodipine-valsartan-hctz) .[name].. 1 by mouth once daily 4)  Premarin 0.3 Mg Tabs (Estrogens conjugated) .[name].. 1 by mouth once daily 5)  Fluticasone Propionate 50 Mcg/act Susp (Fluticasone propionate) .... 2 puffs  two times a day 6)  Zyrtec Allergy 10 Mg Caps (Cetirizine hcl) .[name].. 1 by mouth at bedtime 7)  Vicodin Es 7.5-750 Mg Tabs (Hydrocodone-acetaminophen) .[name].. 1 by mouth every 6 hours as needed 8)  Flexeril 10 Mg Tabs (Cyclobenzaprine hcl) .[name].. 1 by mouth three times a day as needed  Patient Instructions: 1)  Please schedule a follow-up appointment in 2 weeks.  2)  Most patients (90%) with low back pain will improve with time ( 2-6 weeks). Keep active but avoid activities that are painful. Apply moist heat and/or ice to lower back several times a day.  Prescriptions: EXFORGE HCT 5-160-25 MG TABS (AMLODIPINE-VALSARTAN-HCTZ) 1 by mouth once daily  #90 x 3   Entered and Authorized by:   Loreen Freud DO  Signed by:   Loreen Freud DO on 08/06/2009   Method used:   Print then Give to Patient   RxID:   [id number] FLEXERIL 10 MG TABS (CYCLOBENZAPRINE HCL) 1 by mouth three times a day as needed  #30 x 0   Entered and Authorized by:   Loreen Freud DO   Signed by:   Loreen Freud DO on 08/06/2009   Method used:   Print then Give to Patient   RxID:   [id number] VICODIN ES 7.5-750 MG TABS (HYDROCODONE-ACETAMINOPHEN) 1 by mouth every 6 hours as needed  #30 x 0   Entered and Authorized by:   Loreen Freud DO   Signed by:   Loreen Freud DO on 08/06/2009   Method used:   Print then Give to Patient   RxID:   [id number] EXFORGE HCT 5-160-25 MG  TABS (AMLODIPINE-VALSARTAN-HCTZ) 1 by mouth once daily  #30 x 2   Entered and Authorized by:   Loreen Freud DO   Signed by:   Loreen Freud DO on 08/06/2009   Method used:   Electronically to        [facility] [address] [phone]* (retail)       [address]       [place], [place]  [address]       Ph: [phone] or [phone]       Fax: [phone]   RxID:   [id number]

## 2010-02-05 NOTE — Progress Notes (Signed)
Summary: cp    Phone Note Call from Patient Call back at [phone]   Caller: Patient Reason for Call: Acute Illness Complaint: Chest Pain Summary of Call: chest pressure that started sun/ pain on and off with sob/very tried/ left are numbness.  Initial call taken by: Omar Person,  July 17, 2008 10:29 AM  Follow-up for Phone Call        talked with patient -started yesterday pressure around her heart and into back off and on- pt states feels like indigestion -now continues to have discomfort/ pain around her heart and into her back and she "just doesn't feel right"- advised pt to call 911 for transport to ER pt agreed

## 2010-02-05 NOTE — Letter (Signed)
Summary: Results Follow-up Letter  [name]  [address] [place], [place] [address]   Phone: [phone]  Fax: [phone]    12/27/2007        [name]. [address] [place]  [address]  Dear [name],   The following are the results of your recent test(s):  Test     Result     Pap Smear    Normal_______  Not Normal_____       Comments: _________________________________________________________ Cholesterol LDL(Bad cholesterol):          Your goal is less than:         HDL (Good cholesterol):        Your goal is more than: _________________________________________________________ Other Tests:   _________________________________________________________  Please call for an appointment Or _Please see attached labwork.________________________________________________________ _________________________________________________________ _________________________________________________________  Sincerely,  Ardyth Man [facility]
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
## 2010-02-05 NOTE — Assessment & Plan Note (Signed)
Summary: cough/asthma/apc   Copy to:  Dr. Loreen Freud Primary Provider/Referring Provider:  Loreen Freud DO  CC:  Pulmonary Consult for SOB with activity and rest and dry cough..  History of Present Illness: 55/F, never smoker referred for evalaution of cough, wheezing & dyspnea since Oct 2010. This started with a head & chest cold in october, went away , then returned. On 12/04/08 OV, she received a steroid shot, MDI & avelox & symbicort were given On 1/18 OV, received prednisone. She was seen by Dr Willa Rough (allergist). She required an ER visit 12/12/08 & again on 02/05/09 >> d-dimer neg, prednisone 30 mg , cheratussin , told she had bronchitis. CXR 1/19 no infx or effusion. Prednisone has caused weight gain. She denies sputum production, chest pain, orthopnea or pND. History does not indicate any environmental trigger. Pro- air causes palpitations    Current Medications (verified): 1)  Calcium .[name].. 1 Tablet By Mouth Once Daily 2)  Multivitamin .[name].. 1 Tablet By Mouth Once Daily 3)  Flaxseed Oil .[name].. 1 Tablet By Mouth Once Daily 4)  Exforge Hct 5-160-12.5 Mg Tabs (Amlodipine-Valsartan-Hctz) .[name].. 1 By Mouth Once Daily 5)  Premarin 0.3 Mg Tabs (Estrogens Conjugated) .[name].. 1 By Mouth Once Daily 6)  Symbicort 160-4.5 Mcg/act Aero (Budesonide-Formoterol Fumarate) .... 2 Puffs Two Times A Day 7)  Astepro 0.15 % Soln (Azelastine Hcl) .... 2 Sprays Each Nostril Once Daily As Needed 8)  Veramyst 27.5 Mcg/spray Susp (Fluticasone Furoate) .... 2 Spray Each Nostril Once Daily As Needed 9)  Proair Hfa 108 (90 Base) Mcg/act Aers (Albuterol Sulfate) .... 2 Puffs Qid As Needed 10)  Prednisone 10 Mg Tabs (Prednisone) .... 3 By Mouth Once Daily For 3 Days Then 2 By Mouth Once Daily For 3 Days Then 1 By Mouth Once Daily For 3 Days 11)  Cheratussin Ac 100-10 Mg/31ml Syrp (Guaifenesin-Codeine) .[name].. 1-2 Tsp By Mouth At Bedtime As Needed 12)  Benzonatate 100 Mg Caps (Benzonatate) .... Take One Capsule Every 8  Hours As Needed For Cough  Allergies (verified): 1)  ! Adhesive Bandages Fexible (Adhesive Bandages)  Past History:  Past Medical History: Last updated: 01/23/2009 Anemia-NOS Atrial fibrillation: PAF initially documented in 2005 and again in 07/2006 Headache Hypertension GERD Gastritis fatty Liver Diverticulosis Internal Hemorroids depression Sleep apnea COLON POLYPS Asthma  Past Surgical History: Last updated: 04/21/2008 Hysterectomy--partial Appendectomy Colonoscopy LAST DONE 5/09 Breast reduction TAH-->BSO cervical fusion  Family History: Last updated: 01/28/2008 Family History Diabetes 1st degree relative Family History of CAD Female 1st degree relative <60  Social History: Last updated: 02/13/2009 Married Never Smoked Alcohol use-no Occupation: [employer] Drug use-no Regular exercise-no 2 children  Social History: Married Never Smoked Alcohol use-no Occupation: [employer] Drug use-no Regular exercise-no 2 children  Review of Systems       The patient complains of shortness of breath with activity, shortness of breath at rest, non-productive cough, irregular heartbeats, indigestion, loss of appetite, weight change, and depression.  The patient denies coughing up blood, chest pain, acid heartburn, abdominal pain, difficulty swallowing, sore throat, tooth/dental problems, headaches, nasal congestion/difficulty breathing through nose, sneezing, itching, ear ache, anxiety, hand/feet swelling, joint stiffness or pain, rash, change in color of mucus, and fever.    Vital Signs:  Patient profile:   55 year old female Height:      65 inches Weight:      234.25 pounds BMI:     39.12 O2 Sat:      96 % on Room air Temp:  97.6 degrees F oral Pulse rate:   83 / minute BP sitting:   136 / 86  (right arm) Cuff size:   regular  Vitals Entered By: Gweneth Dimitri RN (February 13, 2009 1:45 PM)  O2 Flow:  Room air CC: Pulmonary Consult for SOB with activity and  rest and dry cough. Comments Medications reviewed with patient Daytime contact number verified with patient. Gweneth Dimitri RN  February 13, 2009 1:45 PM    Physical Exam  Additional Exam:  Gen. Pleasant, well-nourished, in no distress, normal affect ENT - no lesions, no post nasal drip Neck: No JVD, no thyromegaly, no carotid bruits Lungs: no use of accessory muscles, no dullness to percussion, clear without rales or rhonchi  Cardiovascular: Rhythm regular, heart sounds  normal, no murmurs or gallops, no peripheral edema Abdomen: soft and non-tender, no hepatosplenomegaly, BS normal. Musculoskeletal: No deformities, no cyanosis or clubbing Neuro:  alert, non focal     Impression & Recommendations:  Problem # 1:  BRONCHITIS- ACUTE (ICD-466.0)  Asthmatic bronchitis vs reactive airway disease - no clear trigger- occupational or environmental. Complete course of prednisone Take advair 500/50 sample two times a day instead of symbicort Take singulair once daily x 2 weeks  Cheratussin at bedtime  Her updated medication list for this problem includes:    Symbicort 160-4.5 Mcg/act Aero (Budesonide-formoterol fumarate) .[name]... 2 puffs two times a day    Proair Hfa 108 (90 Base) Mcg/act Aers (Albuterol sulfate) .[name]... 2 puffs qid as needed    Cheratussin Ac 100-10 Mg/84ml Syrp (Guaifenesin-codeine) .[name]... 1-2 tsp by mouth at bedtime as needed    Benzonatate 100 Mg Caps (Benzonatate) .[name]... Take one capsule every 8 hours as needed for cough  Orders: Consultation Level III (16109) Prescription Created Electronically [phone])  Problem # 2:  GERD (ICD-530.81)  Take pepcid 20 two times a day x 4 weeks Her updated medication list for this problem includes:    Pepcid 20 Mg Tabs (Famotidine) .[name]..[name] Two times a day  Orders: Consultation Level III (09811) Prescription Created Electronically [phone])  Medications Added to Medication List This Visit: 1)  Astepro 0.15 % Soln (Azelastine hcl) ....  2 sprays each nostril once daily as needed 2)  Veramyst 27.5 Mcg/spray Susp (Fluticasone furoate) .... 2 spray each nostril once daily as needed 3)  Benzonatate 100 Mg Caps (Benzonatate) .... Take one capsule every 8 hours as needed for cough 4)  Pepcid 20 Mg Tabs (Famotidine) .... Two times a day  Patient Instructions: 1)  Copy sent to:Dr Lowne 2)  Please schedule a follow-up appointment in 2 weeks. 3)  Complete course of prednisone 4)  Take advair 500/50 sample two times a day instead of symbicort 5)  Take singulair once daily x 2 weeks 6)  Take pepcid 20 two times a day x 4 weeks 7)  Cheratussin at bedtime  Prescriptions: PEPCID 20 MG TABS (FAMOTIDINE) two times a day  #60 x 1   Entered and Authorized by:   Comer Locket Vassie Loll MD   Signed by:   Comer Locket Vassie Loll MD on 02/13/2009   Method used:   Electronically to        [facility]. [address] [phone]* (retail)       [address]       [place], [place]  [address]       Ph: [phone] or [phone]       Fax: [phone]  RxID:   [id number]

## 2010-02-05 NOTE — Assessment & Plan Note (Signed)
Summary: cpx/cbs   Vital Signs:  Patient Profile:   55 Years Old Female Height:     65 inches Weight:      226.2 pounds Temp:     98.2 degrees F oral Pulse rate:   74 / minute Resp:     20 per minute BP sitting:   150 / 90  (left arm)  Pt. in pain?   no  Vitals Entered By: Jeremy Johann CMA (January 28, 2008 1:11 PM)                  PCP:  Laury Axon  Chief Complaint:  cpx.  History of Present Illness: Pt here for CPE.  No pap--pt sees gyn.  Pt is having trouble with hot flashes and irritability.  Pt under a lot of stress.    Current Allergies (reviewed today): No known allergies   Past Medical History:    Reviewed history from 03/03/2007 and no changes required:       Anemia-NOS       Atrial fibrillation       Headache       Hypertension       GERD       Gastritis       fatty Liver       Diverticulosis       Internal Hemorroids       depression       Sleep apnea  Past Surgical History:    Reviewed history from 03/03/2007 and no changes required:       Hysterectomy--partial       Appendectomy       Colonoscopy       Breast reduction       TAH-->BSO       cervical fusion   Family History:    Reviewed history from 06/25/2006 and no changes required:       Family History Diabetes 1st degree relative       Family History of CAD Female 1st degree relative <60  Social History:    Reviewed history from 06/25/2006 and no changes required:       Married       Never Smoked       Alcohol use-no       Occupation: [employer]       Drug use-no       Regular exercise-no   Risk Factors:  Tobacco use:  never Passive smoke exposure:  no Drug use:  no HIV high-risk behavior:  no Caffeine use:  0 drinks per day Alcohol use:  no Exercise:  no Seatbelt use:  100 %  Family History Risk Factors:    Family History of MI in females < 53 years old:  yes    Family History of MI in males < 26 years old:  no  Colonoscopy History:    Date of Last Colonoscopy:   05/14/2007  PAP Smear History:     Date of Last PAP Smear:  07/21/2007    Results:  Normal   Mammogram History:     Date of Last Mammogram:  07/21/2007    Results:  Normal Bilateral    Review of Systems      See HPI  General      Denies chills, fatigue, fever, loss of appetite, malaise, sleep disorder, sweats, weakness, and weight loss.  Eyes      Denies blurring, discharge, double vision, eye irritation, eye pain, halos, itching, light sensitivity, red eye, vision loss-1  eye, and vision loss-both eyes.      optho--q2y  ENT      Denies decreased hearing, difficulty swallowing, ear discharge, earache, hoarseness, nasal congestion, nosebleeds, postnasal drainage, ringing in ears, sinus pressure, and sore throat.      dentist due  CV      Complains of chest pain or discomfort.      Denies bluish discoloration of lips or nails, difficulty breathing at night, difficulty breathing while lying down, fainting, fatigue, leg cramps with exertion, lightheadness, near fainting, palpitations, shortness of breath with exertion, swelling of feet, swelling of hands, and weight gain.  Resp      Denies chest discomfort, chest pain with inspiration, cough, coughing up blood, excessive snoring, hypersomnolence, morning headaches, pleuritic, shortness of breath, sputum productive, and wheezing.  GI      Denies abdominal pain, bloody stools, change in bowel habits, constipation, dark tarry stools, diarrhea, excessive appetite, gas, hemorrhoids, indigestion, loss of appetite, nausea, vomiting, vomiting blood, and yellowish skin color.  GU      Denies abnormal vaginal bleeding, decreased libido, discharge, dysuria, genital sores, hematuria, incontinence, nocturia, urinary frequency, and urinary hesitancy.  MS      Denies joint pain, joint redness, joint swelling, loss of strength, low back pain, mid back pain, muscle aches, muscle , cramps, muscle weakness, stiffness, and thoracic pain.  Derm       Denies changes in color of skin, changes in nail beds, dryness, excessive perspiration, flushing, hair loss, insect bite(s), itching, lesion(s), poor wound healing, and rash.  Neuro      Denies brief paralysis, difficulty with concentration, disturbances in coordination, falling down, headaches, inability to speak, memory loss, numbness, poor balance, seizures, sensation of room spinning, tingling, tremors, visual disturbances, and weakness.  Psych      Complains of irritability.      Denies alternate hallucination ( auditory/visual), anxiety, depression, easily angered, easily tearful, mental problems, panic attacks, sense of great danger, suicidal thoughts/plans, thoughts of violence, unusual visions or sounds, and thoughts /plans of harming others.  Endo      Denies cold intolerance, excessive hunger, excessive thirst, excessive urination, heat intolerance, polyuria, and weight change.  Heme      Denies abnormal bruising, bleeding, enlarge lymph nodes, fevers, pallor, and skin discoloration.  Allergy      Denies hives or rash, itching eyes, persistent infections, seasonal allergies, and sneezing.   Physical Exam  General:     Well-developed,well-nourished,in no acute distress; alert,appropriate and cooperative throughout examination Head:     Normocephalic and atraumatic without obvious abnormalities. No apparent alopecia or balding. Eyes:     vision grossly intact, pupils equal, pupils round, pupils reactive to light, and no injection.   Ears:     External ear exam shows no significant lesions or deformities.  Otoscopic examination reveals clear canals, tympanic membranes are intact bilaterally without bulging, retraction, inflammation or discharge. Hearing is grossly normal bilaterally. Nose:     External nasal examination shows no deformity or inflammation. Nasal mucosa are pink and moist without lesions or exudates. Mouth:     Oral mucosa and oropharynx without lesions or  exudates.  Teeth in good repair. Neck:     No deformities, masses, or tenderness noted. Breasts:     gyn Lungs:     Normal respiratory effort, chest expands symmetrically. Lungs are clear to auscultation, no crackles or wheezes. Heart:     normal rate, regular rhythm, and no murmur.   Abdomen:  Bowel sounds positive,abdomen soft and non-tender without masses, organomegaly or hernias noted. Genitalia:     gyn Msk:     normal ROM, no joint tenderness, no joint swelling, no joint warmth, no redness over joints, no joint deformities, no joint instability, and no crepitation.   Pulses:     R posterior tibial normal, R dorsalis pedis normal, R carotid normal, L posterior tibial normal, L dorsalis pedis normal, and L carotid normal.   Extremities:     No clubbing, cyanosis, edema, or deformity noted with normal full range of motion of all joints.   Neurologic:     No cranial nerve deficits noted. Station and gait are normal. Plantar reflexes are down-going bilaterally. DTRs are symmetrical throughout. Sensory, motor and coordinative functions appear intact. Skin:     Intact without suspicious lesions or rashes Cervical Nodes:     No lymphadenopathy noted Axillary Nodes:     No palpable lymphadenopathy Psych:     Cognition and judgment appear intact. Alert and cooperative with normal attention span and concentration. No apparent delusions, illusions, hallucinations    Impression & Recommendations:  Problem # 1:  PREVENTIVE HEALTH CARE (ICD-V70.0) ghm utd labs reviewed with pt Orders: EKG w/ Interpretation (93000)   Problem # 2:  CHEST PAIN UNSPECIFIED (ICD-786.50)  Orders: Cardiology Referral (Cardiology) EKG w/ Interpretation (93000)   Problem # 3:  DEPRESSION (ICD-311)  Her updated medication list for this problem includes:    Cymbalta 60 Mg Cpep (Duloxetine hcl) .[name]... 1 by mouth once daily----  start with 30 mg for 1 week then increase to 60 mg rto 3- weeks  Her  updated medication list for this problem includes:    Cymbalta 60 Mg Cpep (Duloxetine hcl) .[name]... 1 by mouth once daily   Problem # 4:  HYPERTENSION (ICD-401.9) rto 3 weeks The following medications were removed from the medication list:    Atenolol 50 Mg Tabs (Atenolol) .[name]... 1 by mouth once daily prn  Her updated medication list for this problem includes:    Diovan Hct 320-12.5 Mg Tabs (Valsartan-hydrochlorothiazide) .[name]... 1 by mouth once daily  BP today: 150/90 Prior BP: 132/80 (11/04/2006)  Labs Reviewed: Creat: 0.7 (12/13/2007) Chol: 135 (12/13/2007)   HDL: 39.4 (12/13/2007)   LDL: 73 (12/13/2007)   TG: 111 (12/13/2007)  Orders: EKG w/ Interpretation (93000)   Complete Medication List: 1)  Diovan Hct 320-12.5 Mg Tabs (Valsartan-hydrochlorothiazide) .[name].. 1 by mouth once daily 2)  Calcium  3)  Multivitamin  4)  Flaxseed Oil  5)  Cymbalta 60 Mg Cpep (Duloxetine hcl) .[name].. 1 by mouth once daily    Prescriptions: DIOVAN HCT 320-12.5 MG TABS (VALSARTAN-HYDROCHLOROTHIAZIDE) 1 by mouth once daily  #90 x 3   Entered and Authorized by:   Loreen Freud DO   Signed by:   Loreen Freud DO on 01/28/2008   Method used:   Electronically to        [id number]* (retail)       [address] [place], [place]  [address]       Ph: [phone]       Fax: [phone]   RxID:   [id number]    EKG  Procedure date:  01/28/2008  Findings:      Normal sinus rhythm with rate of:  77bpm   EKG  Procedure date:  01/28/2008  Findings:      Normal sinus  rhythm with rate of:  77bpm

## 2010-02-05 NOTE — Letter (Signed)
Summary: Call a Nurse  Call a Nurse   Imported By: Lanelle Bal 02/14/2009 10:05:40  _____________________________________________________________________  External Attachment:    Type:   Image     Comment:   External Document

## 2010-02-05 NOTE — Progress Notes (Signed)
Summary: bronchitis f/u appt needed  Phone Note Call from Patient Call back at [facility] Phone [phone]   Caller: Patient Call For: alva Reason for Call: Talk to Nurse Summary of Call: Patient needs 2 week return w/ Vassie Loll, please advise an appt time/date.  Thanks. Initial call taken by: Lehman Prom,  December 04, 2009 4:02 PM  Follow-up for Phone Call        Shanda Bumps, pt was seen by TP today and was told to f/u in 2 wks.  Dr. Vassie Loll is out of the office the week of Dec 12.  Pls advise if pt can be worked in on Fri, Dec 9.  Thanks! Gweneth Dimitri RN  December 04, 2009 4:23 PM    Additional Follow-up for Phone Call Additional follow up Details #1::        Dr. Vassie Loll please advise. Thanks. Zackery Barefoot CMA  December 04, 2009 4:55 PM     Additional Follow-up for Phone Call Additional follow up Details #2::    I can only see her 12/21 at earliest. TP can see her sooner if needed Follow-up by: Comer Locket. Vassie Loll MD,  December 04, 2009 9:44 PM  Additional Follow-up for Phone Call Additional follow up Details #3:: Details for Additional Follow-up Action Taken: [facility] to schedule OV.  Gweneth Dimitri RN  December 05, 2009 9:04 AM  Returning call, can be reached at [phone].Darletta Moll  December 05, 2009 11:20 AM   Pt wanting to see Dr. Vassie Loll on 12/26/2009 and this was scheduled. Pt will call if her sxs do not improve or get worse.  Additional Follow-up by: Michel Bickers CMA,  December 05, 2009 11:32 AM

## 2010-02-05 NOTE — Progress Notes (Signed)
Summary: DRUG CHANGE REQUEST  Phone Note Refill Request   Refills Requested: Medication #1:  EXFORGE HCT 5-160-12.5 MG TABS 1 by mouth once daily   Dosage confirmed as above?Dosage Confirmed   Supply Requested: 3 months   Last Refilled: 08/08/2009   Notes: MANUF. B/O PLEASE CHECK WITH MD FOR A SUBSTITUTION OR IF SAMPLES AVAILABLE. [address]  Next Appointment Scheduled: 11/22/09 Initial call taken by: Lavell Islam,  August 29, 2009 8:18 AM  Follow-up for Phone Call        DR LOWNE PLS ADVISE..........Marland KitchenFelecia Deloach CMA  August 29, 2009 8:31 AM   Additional Follow-up for Phone Call Additional follow up Details #1::        what about exforge 5/160 ?---we can call in HCTZ 12.5 separately. Additional Follow-up by: Loreen Freud DO,  August 29, 2009 10:38 AM    Additional Follow-up for Phone Call Additional follow up Details #2::    left pt detail message med on B/O 2 new rx sent to pharmacy call with any ? about medsn othewise rx ready at pharmacy...Marland KitchenMarland KitchenFelecia Deloach CMA  August 29, 2009 12:05 PM   New/Updated Medications: EXFORGE 5-160 MG TABS (AMLODIPINE BESYLATE-VALSARTAN) tAKE 1 tab once daily HYDROCHLOROTHIAZIDE 12.5 MG TABS (HYDROCHLOROTHIAZIDE) Take 1 tab once daily Prescriptions: HYDROCHLOROTHIAZIDE 12.5 MG TABS (HYDROCHLOROTHIAZIDE) Take 1 tab once daily  #90 x 0   Entered by:   Jeremy Johann CMA   Authorized by:   Loreen Freud DO   Signed by:   Jeremy Johann CMA on 08/29/2009   Method used:   Faxed to ...       [facility] [address] [phone]* (retail)       [address]       [place], [place]  [address]       Ph: [phone] or [phone]       Fax: [phone]   RxID:   [id number] EXFORGE 5-160 MG TABS (AMLODIPINE BESYLATE-VALSARTAN) tAKE 1 tab once daily  #90 x 0   Entered by:   Jeremy Johann CMA   Authorized by:   Loreen Freud DO   Signed by:   Jeremy Johann CMA on 08/29/2009   Method used:   Faxed to ...  [facility] [address] [phone]* (retail)       [address]       [place], [place]  [address]       Ph: [phone] or [phone]       Fax: [phone]   RxID:   [id number]

## 2010-02-05 NOTE — Progress Notes (Signed)
Summary: Abd Pain   Phone Note Call from Patient Call back at Work Phone [phone]   Call For: [name] Reason for Call: Talk to Nurse Summary of Call: Stomach is very sick-feeling is not going away-something is just sitteing there on left side. Initial call taken by: Leanor Kail [facility],  April 20, 2008 11:22 AM  Follow-up for Phone Call        patient c/o LUQ abdominal discomfort and nausea.  Had a stomach virus last week, but still feels worse.  She would like an appointment this week with anyone.  She will come see Mike Gip PA on 04-21-08 8:30 Follow-up by: Darcey [name] RN,  April 20, 2008 11:40 AM

## 2010-02-05 NOTE — Procedures (Signed)
Summary: colonoscopy   Colonoscopy  Procedure date:  05/14/2007  Findings:      Location:  [facility].    Procedures Next Due Date:    Colonoscopy: 05/2012  Patient Name: [name], [name] MRN:  Procedure Procedures: Colonoscopy CPT: 10272.    with polypectomy. CPT: A3573898.  Personnel: Endoscopist: [name]. [name], MD, Clementeen Graham.  Exam Location: Exam performed in Outpatient Clinic. Outpatient  Patient Consent: Procedure, Alternatives, Risks and Benefits discussed, consent obtained, from patient. Consent was obtained by the RN.  Indications  Surveillance of: Adenomatous Polyp(s). Initial polypectomy was performed in 2004. in May.  History  Current Medications: Patient is not currently taking Coumadin.  Pre-Exam Physical: Performed May 14, 2007. Cardio-pulmonary exam, Rectal exam, HEENT exam , Abdominal exam, Mental status exam WNL.  Comments: Pt. history reviewed/updated, physical exam performed prior to initiation of sedation?Yes Exam Exam: Extent of exam reached: Cecum, extent intended: Cecum.  The cecum was identified by appendiceal orifice and IC valve. Time to Cecum: 00:03: 36. Time for Withdrawl: 00:09:29. Colon retroflexion performed. ASA Classification: II. Tolerance: excellent.  Monitoring: Pulse and BP monitoring, Oximetry used. Supplemental O2 given.  Colon Prep Used MoviPrep for colon prep. Prep results: excellent.  Sedation Meds: Patient assessed and found to be appropriate for moderate (conscious) sedation. Fentanyl 75 mcg. given IV. Versed 7 mg. given IV.  Findings OTHER FINDING: Lipoma found in Hepatic Flexure. Comments: soft, pillow sign, 1cm.  NORMAL EXAM: Transverse Colon to Descending Colon.  - DIVERTICULOSIS: Sigmoid Colon. Not bleeding. ICD9: Diverticulosis: 562.10.  NORMAL EXAM: Cecum to Ascending Colon.  POLYP: Sigmoid Colon, Maximum size: 4 mm. sessile polyp. Procedure:  snare without cautery, removed, retrieved, Polyp  sent to pathology. ICD9: Colon Polyps: 211.3.  HEMORRHOIDS: Internal. Size: Small. Not bleeding. Not thrombosed. ICD9: Hemorrhoids, Internal: 455.0.   Assessment  Diagnoses: 562.10: Diverticulosis.  455.0: Hemorrhoids, Internal.  211.3: Colon Polyps.   Events  Unplanned Interventions: No intervention was required.  Unplanned Events: There were no complications. Plans  Post Exam Instructions: Post sedation instructions given.  Patient Education: Patient given standard instructions for: Diverticulosis. Hemorrhoids.  Disposition: After procedure patient sent to recovery. After recovery patient sent home.  Scheduling/Referral: Colonoscopy, to [facility] T. [name], MD, [facility], around May 13, 2012.   REPORT OF SURGICAL PATHOLOGY   Case #: [id number] Patient Name: [name]. Office Chart Number:  [id number]   MRN: [id number] Pathologist: Alden Server A. Delila Spence, MD DOB/Age  Nov 05, 1955 (Age: 55)    Gender: F Date Taken:  05/14/2007 Date Received: 05/14/2007   FINAL DIAGNOSIS   ***MICROSCOPIC EXAMINATION AND DIAGNOSIS***   SIGMOID COLON, POLYP(S):  HYPERPLASTIC POLYP(S).  NO ADENOMATOUS CHANGE OR MALIGNANCY IDENTIFIED.   mj Date Reported:  05/17/2007     Lyn Hollingshead. Delila Spence, MD *** Electronically Signed Out By EAA ***   Clinical information History of polyps (mj)   specimen(s) obtained Colon, polyp(s), sigmoid   Gross Description Received in formalin is a tan, soft tissue fragment that is submitted in toto.  Size:  0.3 cm  (GP:jes, 05/17/07)    jes/     Signed by Meryl Dare MD on 05/17/2007 at 8:35 PM  ________________________________________________________________________ colon 05/2012   Signed by Meryl Dare MD on 05/17/2007 at 8:35 PM  ________________________________________________________________________   May 17, 2007 MRN: [id number]    [facility] [address] [place], [place]  [address]    Dear Ms. Hansen,  I am pleased to inform  you that the colon polyp(s) removed  during your recent colonoscopy was (were) found to be benign (no cancer detected) upon pathologic examination.  I recommend you have a repeat colonoscopy examination in 5 years to look for recurrent polyps, as having colon polyps increases your risk for having recurrent polyps or even colon cancer in the future.  Should you develop new or worsening symptoms of abdominal pain, bowel habit changes or bleeding from the rectum or bowels, please schedule an evaluation with either your primary care physician or with me.  Continue treatment plan as outlined the day of your exam.  Please call us if you are having persistent problems or have questions about your condition that have not been fully answered at this time.  Sincerely,  Meryl Dare MD  This letter has been electronically signed by your physician. This report was created from the original endoscopy report, which was reviewed and signed the above listed endoscopist.   Appended Document: colonoscopy    Clinical Lists Changes  Observations: Added new observation of COLONOSCOPY: diverticulosis, hem. polyps (05/14/2007 18:05)        Preventive Care Screening  Colonoscopy:    Date:  05/14/2007    Results:  diverticulosis, hem. polyps

## 2010-02-07 NOTE — Progress Notes (Signed)
Summary: Triage  Medications Added PROCTOZONE-HC 2.5 % CREA (HYDROCORTISONE) apply to hemorrhoids as needed       Phone Note Call from Patient Call back at Work Phone [phone]   Caller: Patient Call For: Dr. [name] Reason for Call: Talk to Nurse Summary of Call: Rectal bleeding and abd pain Initial call taken by: [name],  January 02, 2010 10:03 AM  Follow-up for Phone Call        I spoke with the patient she denies any abdominal pain or rectal pain. I advised the pt the note says she was having abdominal pain, she again denies any otehr GI complaints except BRRB on tissue after a BM.  Patient does have a hx of hemorrhoids.  Patient reports 5 days of bright red rectal bleeding mostly in the am after a BM. She started herself on stool softeners 2 days ago.   Patient states we have given her suppositories in the past to treat her hems.  Pt instructed to soak in a  tub of warm water/sitz bath for 10 minutes BID, Tucs pads, meticulous cleaning between bowel movements. Pt is also advised to maintain a high fiber diet and drink plenty of fluids.  I have given her a follow up appointment with [name] for 02/12/09.  Dr Leone Payor you are MD of the day, can I send her in rectal creams and some suppositories? Follow-up by: Darcey [name] RN, CGRN,  January 02, 2010 10:42 AM  Additional Follow-up for Phone Call Additional follow up Details #1::        I rxed cream she needs to be sure to follow-up with Dr. [name], however [name], [facility]  January 02, 2010 11:52 AM     Additional Follow-up for Phone Call Additional follow up Details #2::    patient aware Follow-up by: Darcey [name] RN, CGRN,  January 02, 2010 2:03 PM  New/Updated Medications: PROCTOZONE-HC 2.5 % CREA (HYDROCORTISONE) apply to hemorrhoids as needed Prescriptions: PROCTOZONE-HC 2.5 % CREA (HYDROCORTISONE) apply to hemorrhoids as needed  #30 grams x 0   Entered by:   [name], [facility]   Authorized by:    Meryl Dare MD [facility]   Signed by:   [name], [facility] on 01/02/2010   Method used:   Electronically to        [facility] (retail)       [address]       [place], [place]  [address]       Ph: [phone]       Fax: [phone]   RxID:   [id number]

## 2010-02-07 NOTE — Progress Notes (Signed)
Summary: Abdominal pain and Rectal bleeding   Phone Note Call from Patient   Caller: Patient Call For: Loreen Freud DO Details for Reason: Rectal Bleeding/Abdominal pain Summary of Call: Call from patient and she stated she had been having abdominal pain and rectal bleeding x5days. Per patient the bllod is bright red and she sees it after every bowel movment. I asked patient did she have a Hx of hemorrhoids, and she stated she did in the past. She is a patient of Dr.Stark and tried to get an appt with them today ut was told since she had not been seen in a whilie she needed to be called back. I advised Dr.Lowne was out of town and our schedule was booked, advised I would try to call GI and see if they would see her, pt agreed.  Call to GI and I left a detailed mssg for the nurse about patient with my call back information. The patient has been advised to go to the ED if the pain becomes severe or the bleeding increase before she hears back from [place]... Initial call taken by: Almeta Monas CMA Duncan Dull),  January 02, 2010 10:23 AM  Follow-up for Phone Call        Phone Call Completed.Marland KitchenMarland KitchenPer patient she heard from GI and they were going to call her in something.... Follow-up by: Almeta Monas CMA Duncan Dull),  January 02, 2010 10:57 AM

## 2010-02-07 NOTE — Assessment & Plan Note (Signed)
Summary: bronchitis follow-up/LC   Visit Type:  Acute visit Copy to:  Dr. Loreen Freud Primary Provider/Referring Provider:  Loreen Freud DO  CC:  Pt c/o productive cough with yellow mucus x weeks, throat clearing, and nasal congestion worse at night.  History of Present Illness: 55/F, never smoker for evalaution of episodic asthmatic bronchitis This started with a head & chest cold in october '10, went away , then returned. On 12/04/08 OV, she received a steroid shot, MDI & avelox & symbicort were given On 01/23/09  OV, received prednisone. She was seen by Dr Willa Rough (allergist). She required an ER visit 12/12/08 & again on 02/05/09 >> d-dimer neg, prednisone 30 mg , cheratussin , told she had bronchitis. CXR 1/19 no infx or effusion. Prednisone has caused weight gain.  History does not indicate any environmental trigger. Pro- air causes palpitations Initial impresion was :Asthmatic bronchitis vs reactive airway disease - no clear trigger Much improved by Feb with  advair 500/50 , singulair & Cheratussin.  April 02, 2009-- acute OV. c/o  prod cough with yellow mucus, wheezing x3days. Wonders if allergies may be the culprit and would like to discuss a referral - She is not taking any meds for prevention. Says the pollen, flowers make her symptoms worse.  Could not afford Advair or Singulair.   December 27, 2009 12:17 PM  - FU from acute OV on nov 29 c/o prod cough with yellow/green mucus, sinus pressure/congestion with same colored mucus, wheezing, increased SOB, sweats x5days. Previously recommended to take Advair and singulair however did not fill after samples ran out. Last seen 6 months ago says she did fine until last week when she got this cold. Husband and grandson have had similar symptoms >> Given ABx, pred taper x 8ds, augmentin caused diarrhea, changed to z-pak. Better but cough is persistent, now clear - dry. Occasional heartburn  Denies chest pain,  orthopnea, hemoptysis, fever,  n/v/d, edema, headache.     Preventive Screening-Counseling & Management  Alcohol-Tobacco     Alcohol drinks/day: 0     Smoking Status: never  Current Medications (verified): 1)  Calcium Carbonate-Vitamin D 600-400 Mg-Unit  Tabs (Calcium Carbonate-Vitamin D) .... Take 1 Tablet By Mouth Once A Day 2)  Multivitamins   Tabs (Multiple Vitamin) .... Take 1 Tablet By Mouth Once A Day 3)  Exforge 5-160 Mg Tabs (Amlodipine Besylate-Valsartan) .... Take 1 Tab Once Daily 4)  Premarin 0.45 Mg Tabs (Estrogens Conjugated) .[name].. 1 By Mouth Once Daily 5)  Fluticasone Propionate 50 Mcg/act Susp (Fluticasone Propionate) .... 2 Puffs  Two Times A Day 6)  Zyrtec Allergy 10 Mg Caps (Cetirizine Hcl) .[name].. 1 By Mouth At Bedtime 7)  Proair Hfa 108 (90 Base) Mcg/act Aers (Albuterol Sulfate) .... As Needed 8)  Hydrochlorothiazide 12.5 Mg Caps (Hydrochlorothiazide) .... Take 1 Tablet By Mouth Once A Day  Allergies (verified): 1)  ! Adhesive Bandages Fexible (Adhesive Bandages)  Past History:  Past Medical History: Last updated: 01/23/2009 Anemia-NOS Atrial fibrillation: PAF initially documented in 2005 and again in 07/2006 Headache Hypertension GERD Gastritis fatty Liver Diverticulosis Internal Hemorroids depression Sleep apnea COLON POLYPS Asthma  Social History: Last updated: 02/13/2009 Married Never Smoked Alcohol use-no Occupation: [employer] Drug use-no Regular exercise-no 2 children  Review of Systems       The patient complains of prolonged cough.  The patient denies anorexia, fever, weight loss, weight gain, vision loss, decreased hearing, hoarseness, chest pain, syncope, dyspnea on exertion, peripheral edema, headaches, hemoptysis, abdominal pain,  melena, hematochezia, severe indigestion/heartburn, hematuria, muscle weakness, suspicious skin lesions, transient blindness, difficulty walking, depression, unusual weight change, abnormal bleeding, enlarged lymph nodes, and angioedema.     Vital Signs:  Patient profile:   55 year old female Height:      65 inches Weight:      230 pounds BMI:     38.41 O2 Sat:      98 % on Room air Temp:     98.4 degrees F oral Pulse rate:   98 / minute BP sitting:   126 / 80  (left arm) Cuff size:   large  Vitals Entered By: Zackery Barefoot CMA (December 26, 2009 4:46 PM)  O2 Flow:  Room air CC: Pt c/o productive cough with yellow mucus x weeks, throat clearing, nasal congestion worse at night Comments Medications reviewed with patient Verified contact number and pharmacy with patient Zackery Barefoot CMA  December 26, 2009 4:47 PM    Physical Exam  Additional Exam:  Gen. Pleasant, well-nourished, in no distress, normal affect ENT - no lesions, pale nasal mucosa w/ clear discharge .  Neck: No JVD, no thyromegaly, no carotid bruits Lungs: coarse BS w/ exp wheezing  Cardiovascular: Rhythm regular, heart sounds  normal, no murmurs or gallops, no peripheral edema Musculoskeletal: No deformities, no cyanosis or clubbing      Impression & Recommendations:  Problem # 1:  COUGH (ICD-786.2)  Post bronchitic cough ,most likely - no bronchospasm, no need for abx or steroids. Cough perles three times a day  Delsym syrup daytime two times a day as needed Codeine cough syrup at bedtime  - start with 1tsp , increase to 2 tsp at bedtime if no hangover  Orders: Est. Patient Level III ([address]) Prescription Created Electronically [phone])  Problem # 2:  GERD (ICD-530.81)  Generic omeprazole once daily  Her updated medication list for this problem includes:    [facility] Omeprazole 20 Mg Tbec (Omeprazole) ..... Once daily  Orders: Est. Patient Level III ([address]) Prescription Created Electronically [phone])  Problem # 3:  ASTHMA (ICD-493.90) spirometry when better. Doubt she has 'true asthma'  Medications Added to Medication List This Visit: 1)  Proair Hfa 108 (90 Base) Mcg/act Aers (Albuterol sulfate) .... As needed 2)   Hydrochlorothiazide 12.5 Mg Caps (Hydrochlorothiazide) .... Take 1 tablet by mouth once a day 3)  [facility] Omeprazole 20 Mg Tbec (Omeprazole) .... Once daily 4)  Benzonatate 200 Mg Caps (Benzonatate) .... Three times a day 5)  Promethazine-codeine 6.25-10 Mg/24ml Syrp (Promethazine-codeine) .... 2.5- 5ml by mouth at bedtime as needed  Patient Instructions: 1)  Copy sent to: 2)  Please schedule a follow-up appointment in 1 month. 3)  Cough perles three times a day  4)  Delsym syrup daytime two times a day as needed 5)  Codeine cough syrup at bedtime  - start with 1tsp , increase to 2 tsp at bedtime if no hangover 6)  Generic omeprazole once daily  Prescriptions: PROMETHAZINE-CODEINE 6.25-10 MG/5ML SYRP (PROMETHAZINE-CODEINE) 2.5- 5ml by mouth at bedtime as needed  #125 x 0   Entered and Authorized by:   Comer Locket Vassie Loll MD   Signed by:   Comer Locket Vassie Loll MD on 12/26/2009   Method used:   Print then Give to Patient   RxID:   [id number] BENZONATATE 200 MG CAPS (BENZONATATE) three times a day  #70 x 1   Entered and Authorized by:   Comer Locket. Vassie Loll MD   Signed by:   Comer Locket Vassie Loll  MD on 12/26/2009   Method used:   [facility]        [name]       [address]       [place], [place]  [address]       Ph: [phone]       Fax: [phone]   RxID:   [id number] [facility] OMEPRAZOLE 20 MG TBEC (OMEPRAZOLE) once daily  #30 x 1   Entered and Authorized by:   Comer Locket. Vassie Loll MD   Signed by:   Comer Locket Vassie Loll MD on 12/26/2009   Method used:   [facility]        [name]ail[facility]       [address]       [place], [place]  [address]       Ph: [phone]       Fax: [phone]   RxID:   [id number]

## 2010-02-07 NOTE — Progress Notes (Signed)
Summary: results  Phone Note Call from Patient   Caller: Patient Call For: alva Summary of Call: pt wants results of labs. [phone] or [phone] Initial call taken by: Tivis Ringer, CNA,  February 01, 2010 8:53 AM  Follow-up for Phone Call        Please advise on allergy panel results.  Results in EMR. Abigail Miyamoto, RN  No allergies detected on this screening panel Follow-up by: Comer Locket. Vassie Loll MD,  February 01, 2010 11:43 AM  Additional Follow-up for Phone Call Additional follow up Details #1::        Pt informed of lab results. Abigail Miyamoto RN  February 01, 2010 11:52 AM

## 2010-02-07 NOTE — Assessment & Plan Note (Signed)
Summary: cpx/cbs   Vital Signs:  Patient profile:   55 year old female Height:      65 inches Weight:      229.2 pounds BMI:     38.28 Pulse rate:   66 / minute Pulse rhythm:   regular BP sitting:   130 / 76  (right arm) Cuff size:   large  Vitals Entered By: Almeta Monas CMA Duncan Dull) (January 28, 2010 9:07 AM) CC: CPX/fasting--no pap   History of Present Illness: Pt here for cpe.  Pt being treated for bronchitis but pulmonary. She has a f/u with Dr Vassie Loll Wednesday.    Hypertension follow-up      This is a 55 year old woman who presents for Hypertension follow-up.  The patient denies lightheadedness, urinary frequency, headaches, edema, impotence, rash, and fatigue.  The patient denies the following associated symptoms: chest pain, chest pressure, exercise intolerance, dyspnea, palpitations, syncope, leg edema, and pedal edema.  Compliance with medications (by patient report) has been near 100%.  The patient reports that dietary compliance has been good.  The patient reports exercising occasionally.  Adjunctive measures currently used by the patient include salt restriction.    Preventive Screening-Counseling & Management  Alcohol-Tobacco     Alcohol drinks/day: 0     Smoking Status: never     Passive Smoke Exposure: no  Caffeine-Diet-Exercise     Caffeine use/day: 0     Does Patient Exercise: yes     Type of exercise: walking     Exercise (avg: min/session): <30     Times/week: 2     Exercise Counseling: to improve exercise regimen  Hep-HIV-STD-Contraception     HIV Risk: no     STD Risk: never  Safety-Violence-Falls     Seat Belt Use: always      Sexual History:  currently monogamous.    Problems Prior to Update: 1)  Acute Bronchitis  (ICD-466.0) 2)  Palpitations, Hx of  (ICD-V12.50) 3)  Rash-nonvesicular  (ICD-782.1) 4)  Back Pain, Lumbar  (ICD-724.2) 5)  Allergic Rhinitis  (ICD-477.9) 6)  Cough  (ICD-786.2) 7)  Asthma  (ICD-493.90) 8)  Menopausal Syndrome   (ICD-627.2) 9)  Fatty Liver Disease  (ICD-571.8) 10)  Gastritis  (ICD-535.50) 11)  Gerd  (ICD-530.81) 12)  Personal Hx Colonic Polyps  (ICD-V12.72) 13)  Diverticulosis-colon  (ICD-562.10) 14)  Abdominal Pain-luq  (ICD-789.02) 15)  Luq Pain  (ICD-789.02) 16)  Chest Pain Unspecified  (ICD-786.50) 17)  Family History of Cad Female 1st Degree Relative <60  (ICD-V16.49) 18)  Preventive Health Care  (ICD-V70.0) 19)  Bradycardia  (ICD-427.89) 20)  Sleep Apnea  (ICD-780.57) 21)  Depression  (ICD-311) 22)  Hemorrhoids, Internal  (ICD-455.0) 23)  Diverticulitis, Colon  (ICD-562.11) 24)  Adenomatous Colonic Polyp  (ICD-211.3) 25)  Fatty Liver Disease  (ICD-571.8) 26)  Gastritis  (ICD-535.50) 27)  Gerd  (ICD-530.81) 28)  Leg Edema, Right  (ICD-782.3) 29)  Thyroid Nodule  (ICD-241.0) 30)  Malaise and Fatigue  (ICD-780.79) 31)  Goiter Nos  (ICD-240.9) 32)  Low Back Pain, Mild  (ICD-724.2) 33)  Obesity Nos  (ICD-278.00) 34)  Family History Diabetes 1st Degree Relative  (ICD-V18.0) 35)  Hypertension  (ICD-401.9) 36)  Headache  (ICD-784.0) 37)  Atrial Fibrillation  (ICD-427.31) 38)  Anemia-nos  (ICD-285.9)  Medications Prior to Update: 1)  Calcium Carbonate-Vitamin D 600-400 Mg-Unit  Tabs (Calcium Carbonate-Vitamin D) .... Take 1 Tablet By Mouth Once A Day 2)  Multivitamins   Tabs (Multiple Vitamin) .... Take  1 Tablet By Mouth Once A Day 3)  Exforge 5-160 Mg Tabs (Amlodipine Besylate-Valsartan) .... Take 1 Tab Once Daily 4)  Premarin 0.45 Mg Tabs (Estrogens Conjugated) .[name].. 1 By Mouth Once Daily 5)  Fluticasone Propionate 50 Mcg/act Susp (Fluticasone Propionate) .... 2 Puffs  Two Times A Day 6)  Zyrtec Allergy 10 Mg Caps (Cetirizine Hcl) .[name].. 1 By Mouth At Bedtime 7)  Proair Hfa 108 (90 Base) Mcg/act Aers (Albuterol Sulfate) .... As Needed 8)  Hydrochlorothiazide 12.5 Mg Caps (Hydrochlorothiazide) .... Take 1 Tablet By Mouth Once A Day 9)  Proctozone-Hc 2.5 % Crea (Hydrocortisone) .... Apply  To Hemorrhoids As Needed 10)  Promethazine-Codeine 6.25-10 Mg/23ml Syrp (Promethazine-Codeine) .... 1/2 To 1 Tsp By Mouth At Bedtime As Needed 11)  Symbicort 160-4.5 Mcg/act  Aero (Budesonide-Formoterol Fumarate) .... Two Puffs Twice Daily 12)  Tessalon Perles 100 Mg Caps (Benzonatate) .... Take 2 Tabs By Mouth Three Times A Day 13)  Cefdinir 300 Mg Caps (Cefdinir) .... Two By Mouth Each Am 14)  Prilosec 20 Mg Cpdr (Omeprazole) .... Take 1 Tablet By Mouth Once A Day 15)  Mucinex Dm Maximum Strength 60-1200 Mg Xr12h-Tab (Dextromethorphan-Guaifenesin) .... Take 1 Tablet By Mouth Two Times A Day  Current Medications (verified): 1)  Calcium Carbonate-Vitamin D 600-400 Mg-Unit  Tabs (Calcium Carbonate-Vitamin D) .... Take 1 Tablet By Mouth Once A Day 2)  Multivitamins   Tabs (Multiple Vitamin) .... Take 1 Tablet By Mouth Once A Day 3)  Exforge 5-160 Mg Tabs (Amlodipine Besylate-Valsartan) .... Take 1 Tab Once Daily 4)  Premarin 0.45 Mg Tabs (Estrogens Conjugated) .[name].. 1 By Mouth Once Daily 5)  Fluticasone Propionate 50 Mcg/act Susp (Fluticasone Propionate) .... 2 Puffs  Two Times A Day 6)  Zyrtec Allergy 10 Mg Caps (Cetirizine Hcl) .[name].. 1 By Mouth At Bedtime 7)  Proair Hfa 108 (90 Base) Mcg/act Aers (Albuterol Sulfate) .... As Needed 8)  Hydrochlorothiazide 12.5 Mg Caps (Hydrochlorothiazide) .... Take 1 Tablet By Mouth Once A Day 9)  Proctozone-Hc 2.5 % Crea (Hydrocortisone) .... Apply To Hemorrhoids As Needed 10)  Promethazine-Codeine 6.25-10 Mg/41ml Syrp (Promethazine-Codeine) .... 1/2 To 1 Tsp By Mouth At Bedtime As Needed 11)  Symbicort 160-4.5 Mcg/act  Aero (Budesonide-Formoterol Fumarate) .... Two Puffs Twice Daily 12)  Tessalon Perles 100 Mg Caps (Benzonatate) .... Take 2 Tabs By Mouth Three Times A Day 13)  Cefdinir 300 Mg Caps (Cefdinir) .... Two By Mouth Each Am 14)  Prilosec 20 Mg Cpdr (Omeprazole) .... Take 1 Tablet By Mouth Once A Day 15)  Mucinex Dm Maximum Strength 60-1200 Mg Xr12h-Tab  (Dextromethorphan-Guaifenesin) .... Take 1 Tablet By Mouth Two Times A Day 16)  Omeprazole 20 Mg Cpdr (Omeprazole) .... By Mouth Once Daily 17)  Benzonatate 100 Mg Caps (Benzonatate) .[name].. 1 By Mouth Two Times A Day 18)  Cefdinir 300 Mg Caps (Cefdinir) .... By Mouth Once Daily 19)  Prednisone 10 Mg Tabs (Prednisone) .... 3 By Mouth Once Daily For 3 Days Then 2 By Mouth Once Daily For 3 Days Then 1 By Mouth Once Daily For 3 Days  Allergies (verified): 1)  ! Adhesive Bandages Fexible (Adhesive Bandages)  Past History:  Past Medical History: Last updated: 01/23/2009 Anemia-NOS Atrial fibrillation: PAF initially documented in 2005 and again in 07/2006 Headache Hypertension GERD Gastritis fatty Liver Diverticulosis Internal Hemorroids depression Sleep apnea COLON POLYPS Asthma  Past Surgical History: Last updated: 04/21/2008 Hysterectomy--partial Appendectomy Colonoscopy LAST DONE 5/09 Breast reduction TAH-->BSO cervical fusion  Family History: Last updated: 01/28/2008  Family History Diabetes 1st degree relative Family History of CAD Female 1st degree relative <60  Social History: Last updated: 02/13/2009 Married Never Smoked Alcohol use-no Occupation: [employer] Drug use-no Regular exercise-no 2 children  Risk Factors: Alcohol Use: 0 (01/28/2010) Caffeine Use: 0 (01/28/2010) Exercise: yes (01/28/2010)  Risk Factors: Smoking Status: never (01/28/2010) Passive Smoke Exposure: no (01/28/2010)  Family History: Reviewed history from 01/28/2008 and no changes required. Family History Diabetes 1st degree relative Family History of CAD Female 1st degree relative <60  Social History: Reviewed history from 02/13/2009 and no changes required. Married Never Smoked Alcohol use-no Occupation: [employer] Drug use-no Regular exercise-no 2 children  Does Patient Exercise:  yes Sexual History:  currently monogamous  Review of Systems      See HPI General:  Denies  chills, fatigue, fever, loss of appetite, malaise, sleep disorder, sweats, weakness, and weight loss. Eyes:  Denies blurring, discharge, double vision, eye irritation, eye pain, halos, itching, light sensitivity, red eye, vision loss-1 eye, and vision loss-both eyes; ophtho-- q2y. ENT:  Denies decreased hearing, difficulty swallowing, ear discharge, earache, hoarseness, nasal congestion, nosebleeds, postnasal drainage, ringing in ears, sinus pressure, and sore throat. CV:  Denies bluish discoloration of lips or nails, chest pain or discomfort, difficulty breathing at night, difficulty breathing while lying down, fainting, fatigue, leg cramps with exertion, lightheadness, near fainting, palpitations, shortness of breath with exertion, swelling of feet, swelling of hands, and weight gain. Resp:  Complains of cough and wheezing; denies chest discomfort, chest pain with inspiration, coughing up blood, excessive snoring, hypersomnolence, morning headaches, pleuritic, shortness of breath, and sputum productive. GI:  Denies abdominal pain, bloody stools, change in bowel habits, constipation, dark tarry stools, diarrhea, excessive appetite, gas, hemorrhoids, indigestion, loss of appetite, nausea, vomiting, vomiting blood, and yellowish skin color. GU:  Denies abnormal vaginal bleeding, decreased libido, discharge, dysuria, genital sores, hematuria, incontinence, nocturia, urinary frequency, and urinary hesitancy. MS:  Denies joint pain, joint redness, joint swelling, loss of strength, low back pain, mid back pain, muscle aches, muscle , cramps, muscle weakness, stiffness, and thoracic pain. Derm:  Denies changes in color of skin, changes in nail beds, dryness, excessive perspiration, flushing, hair loss, insect bite(s), itching, lesion(s), poor wound healing, and rash. Neuro:  Denies brief paralysis, difficulty with concentration, disturbances in coordination, falling down, headaches, inability to speak, memory  loss, numbness, poor balance, seizures, sensation of room spinning, tingling, tremors, visual disturbances, and weakness. Psych:  Denies alternate hallucination ( auditory/visual), anxiety, depression, easily angered, easily tearful, irritability, mental problems, panic attacks, sense of great danger, suicidal thoughts/plans, thoughts of violence, unusual visions or sounds, and thoughts /plans of harming others. Endo:  Denies cold intolerance, excessive hunger, excessive thirst, excessive urination, heat intolerance, polyuria, and weight change. Heme:  Denies abnormal bruising, bleeding, enlarge lymph nodes, fevers, pallor, and skin discoloration. Allergy:  Denies hives or rash, itching eyes, persistent infections, seasonal allergies, and sneezing.  Physical Exam  General:  Well-developed,well-nourished,in no acute distress; alert,appropriate and cooperative throughout examination Head:  Normocephalic and atraumatic without obvious abnormalities. No apparent alopecia or balding. Eyes:  vision grossly intact, pupils equal, pupils round, pupils reactive to light, and no injection.   Ears:  External ear exam shows no significant lesions or deformities.  Otoscopic examination reveals clear canals, tympanic membranes are intact bilaterally without bulging, retraction, inflammation or discharge. Hearing is grossly normal bilaterally. Nose:  no external deformity, L frontal sinus tenderness, L maxillary sinus tenderness, R frontal sinus tenderness, and R maxillary  sinus tenderness.   Mouth:  Oral mucosa and oropharynx without lesions or exudates.  Teeth in good repair. Neck:  No deformities, masses, or tenderness noted. Breasts:  gyn Lungs:  R wheezes and L diffuse crackles.   Heart:  normal rate and no murmur.   Abdomen:  Bowel sounds positive,abdomen soft and non-tender without masses, organomegaly or hernias noted. Rectal:  gyn Genitalia:  gyn Msk:  normal ROM, no joint tenderness, no joint  swelling, no joint warmth, no redness over joints, no joint deformities, no joint instability, and no crepitation.   Pulses:  R posterior tibial normal, R dorsalis pedis normal, R carotid normal, L posterior tibial normal, L dorsalis pedis normal, and L carotid normal.   Extremities:  No clubbing, cyanosis, edema, or deformity noted with normal full range of motion of all joints.   Neurologic:  No cranial nerve deficits noted. Station and gait are normal. Plantar reflexes are down-going bilaterally. DTRs are symmetrical throughout. Sensory, motor and coordinative functions appear intact. Skin:  Intact without suspicious lesions or rashes Cervical Nodes:  No lymphadenopathy noted Axillary Nodes:  No palpable lymphadenopathy Psych:  Cognition and judgment appear intact. Alert and cooperative with normal attention span and concentration. No apparent delusions, illusions, hallucinations   Impression & Recommendations:  Problem # 1:  PREVENTIVE HEALTH CARE (ICD-V70.0) check fasting labs  ghm utd  rto for tetanus-- pt sick today  Problem # 2:  HYPERTENSION (ICD-401.9)  Her updated medication list for this problem includes:    Exforge 5-160 Mg Tabs (Amlodipine besylate-valsartan) .[name]... Take 1 tab once daily    Hydrochlorothiazide 12.5 Mg Caps (Hydrochlorothiazide) .[name]... Take 1 tablet by mouth once a day  BP today: 130/76 Prior BP: 120/80 (01/25/2010)  Labs Reviewed: K+: 4.7 (01/11/2010) Creat: : 0.8 (01/11/2010)   Chol: 117 (01/11/2010)   HDL: 39.70 (01/11/2010)   LDL: 56 (01/11/2010)   TG: 107.0 (01/11/2010)  Problem # 3:  ACUTE BRONCHITIS (ICD-466.0)  Her updated medication list for this problem includes:    Proair Hfa 108 (90 Base) Mcg/act Aers (Albuterol sulfate) .[name]... As needed    Promethazine-codeine 6.25-10 Mg/110ml Syrp (Promethazine-codeine) .[name]... 1/2 to 1 tsp by mouth at bedtime as needed    Symbicort 160-4.5 Mcg/act Aero (Budesonide-formoterol fumarate) .[name]..[name] Two puffs twice  daily    Tessalon Perles 100 Mg Caps (Benzonatate) .[name]... Take 2 tabs by mouth three times a day    Cefdinir 300 Mg Caps (Cefdinir) .[name]..[name] Two by mouth each am    Mucinex Dm Maximum Strength 60-1200 Mg Xr12h-tab (Dextromethorphan-guaifenesin) .[name]... Take 1 tablet by mouth two times a day    Benzonatate 100 Mg Caps (Benzonatate) .[name]... 1 by mouth two times a day    Cefdinir 300 Mg Caps (Cefdinir) ..... By mouth once daily  Orders: T-2 View CXR (71020TC) Admin of Therapeutic Inj  intramuscular or subcutaneous (96045) Depo- Medrol 80mg  (J1040)  Take antibiotics and other medications as directed. Encouraged to push clear liquids, get enough rest, and take acetaminophen as needed. To be seen in 5-7 days if no improvement, sooner if worse.  Complete Medication List: 1)  Calcium Carbonate-vitamin D 600-400 Mg-unit Tabs (Calcium carbonate-vitamin d) .... Take 1 tablet by mouth once a day 2)  Multivitamins Tabs (Multiple vitamin) .... Take 1 tablet by mouth once a day 3)  Exforge 5-160 Mg Tabs (Amlodipine besylate-valsartan) .... Take 1 tab once daily 4)  Premarin 0.45 Mg Tabs (Estrogens conjugated) .[name].. 1 by mouth once daily 5)  Fluticasone Propionate 50  Mcg/act Susp (Fluticasone propionate) .... 2 puffs  two times a day 6)  Zyrtec Allergy 10 Mg Caps (Cetirizine hcl) .[name].. 1 by mouth at bedtime 7)  Proair Hfa 108 (90 Base) Mcg/act Aers (Albuterol sulfate) .... As needed 8)  Hydrochlorothiazide 12.5 Mg Caps (Hydrochlorothiazide) .... Take 1 tablet by mouth once a day 9)  Proctozone-hc 2.5 % Crea (Hydrocortisone) .... Apply to hemorrhoids as needed 10)  Promethazine-codeine 6.25-10 Mg/34ml Syrp (Promethazine-codeine) .... 1/2 to 1 tsp by mouth at bedtime as needed 11)  Symbicort 160-4.5 Mcg/act Aero (Budesonide-formoterol fumarate) .... Two puffs twice daily 12)  Tessalon Perles 100 Mg Caps (Benzonatate) .... Take 2 tabs by mouth three times a day 13)  Cefdinir 300 Mg Caps (Cefdinir) .... Two by mouth  each am 14)  Prilosec 20 Mg Cpdr (Omeprazole) .... Take 1 tablet by mouth once a day 15)  Mucinex Dm Maximum Strength 60-1200 Mg Xr12h-tab (Dextromethorphan-guaifenesin) .... Take 1 tablet by mouth two times a day 16)  Omeprazole 20 Mg Cpdr (Omeprazole) .... By mouth once daily 17)  Benzonatate 100 Mg Caps (Benzonatate) .[name].. 1 by mouth two times a day 18)  Cefdinir 300 Mg Caps (Cefdinir) .... By mouth once daily 19)  Prednisone 10 Mg Tabs (Prednisone) .... 3 by mouth once daily for 3 days then 2 by mouth once daily for 3 days then 1 by mouth once daily for 3 days  Patient Instructions: 1)  Please schedule a follow-up appointment in 3 months .  Prescriptions: PREDNISONE 10 MG TABS (PREDNISONE) 3 by mouth once daily for 3 days then 2 by mouth once daily for 3 days then 1 by mouth once daily for 3 days  #18 x 0   Entered and Authorized by:   Loreen Freud DO   Signed by:   Loreen Freud DO on 01/28/2010   Method used:   Electronically to        [facility] (retail)       [address]       [place], [place]  [address]       Ph: [phone]       Fax: [phone]   RxID:   [id number]    Medication Administration  Injection # 1:    Medication: Depo- Medrol 80mg     Diagnosis: ACUTE BRONCHITIS (ICD-466.0)    Route: IM    Site: RUOQ gluteus    Exp Date: 07/07/2010    Lot #: [id number]    Mfr: [employer]    Patient tolerated injection without complications    Given by: Almeta Monas CMA Duncan Dull) (January 28, 2010 9:54 AM)  Orders Added: 1)  T-2 View CXR [71020TC] 2)  Admin of Therapeutic Inj  intramuscular or subcutaneous [[id number]] 3)  Depo- Medrol 80mg  [J1040] 4)  Est. Patient 40-64 years [[id number]]    Last Flu Vaccine:  Fluvax 3+ (11/14/2009 10:48:45 AM) Flu Vaccine Result Date:  11/14/2009 Flu Vaccine Result:  given Flu Vaccine Next Due:  1 yr Last Mammogram:  normal (07/06/2008 10:16:10 AM) Mammogram Result Date:  10/17/2009 Mammogram Result:   normal Mammogram Next Due:  1 yr

## 2010-02-07 NOTE — Assessment & Plan Note (Signed)
Summary: acute sick visit for acute bronchitis   Copy to:  Dr. Loreen Freud Primary Provider/Referring Provider:  Loreen Freud DO  CC:  Sick visit.  RA's pt.  Increased cough with yellow sputum x 3 days.  States abd sore d/t increased coughing. [name]  History of Present Illness: the pt comes in today for an acute sick visit.  She has recurrent pulmonary infections of unknown etiology, and it is unclear at this point if she has asthma.  She comes in today with a 4 day h/o chest congestion, cough with purulent mucus, and some sob.  She also has nasal congestion, but is not blowing purulence from her nose.  She denies noncompliance with her meds.  She has not had any f/c/s.  Current Medications (verified): 1)  Calcium Carbonate-Vitamin D 600-400 Mg-Unit  Tabs (Calcium Carbonate-Vitamin D) .... Take 1 Tablet By Mouth Once A Day 2)  Multivitamins   Tabs (Multiple Vitamin) .... Take 1 Tablet By Mouth Once A Day 3)  Exforge 5-160 Mg Tabs (Amlodipine Besylate-Valsartan) .... Take 1 Tab Once Daily 4)  Premarin 0.45 Mg Tabs (Estrogens Conjugated) .[name].. 1 By Mouth Once Daily 5)  Fluticasone Propionate 50 Mcg/act Susp (Fluticasone Propionate) .... 2 Puffs  Two Times A Day 6)  Zyrtec Allergy 10 Mg Caps (Cetirizine Hcl) .[name].. 1 By Mouth At Bedtime 7)  Proair Hfa 108 (90 Base) Mcg/act Aers (Albuterol Sulfate) .... As Needed 8)  Hydrochlorothiazide 12.5 Mg Caps (Hydrochlorothiazide) .... Take 1 Tablet By Mouth Once A Day 9)  Proctozone-Hc 2.5 % Crea (Hydrocortisone) .... Apply To Hemorrhoids As Needed 10)  Promethazine-Codeine 6.25-10 Mg/23ml Syrp (Promethazine-Codeine) .... 1/2 To 1 Tsp By Mouth At Bedtime As Needed 11)  Symbicort 160-4.5 Mcg/act  Aero (Budesonide-Formoterol Fumarate) .... Two Puffs Twice Daily 12)  Tessalon Perles 100 Mg Caps (Benzonatate) .... Take 2 Tabs By Mouth Three Times A Day 13)  Cefdinir 300 Mg Caps (Cefdinir) .... Two By Mouth Each Am 14)  Prilosec 20 Mg Cpdr (Omeprazole) .... Take 1  Tablet By Mouth Once A Day 15)  Mucinex Dm Maximum Strength 60-1200 Mg Xr12h-Tab (Dextromethorphan-Guaifenesin) .... Take 1 Tablet By Mouth Two Times A Day  Allergies (verified): 1)  ! Adhesive Bandages Fexible (Adhesive Bandages)  Past History:  Past medical, surgical, family and social histories (including risk factors) reviewed, and no changes noted (except as noted below).  Past Medical History: Reviewed history from 01/23/2009 and no changes required. Anemia-NOS Atrial fibrillation: PAF initially documented in 2005 and again in 07/2006 Headache Hypertension GERD Gastritis fatty Liver Diverticulosis Internal Hemorroids depression Sleep apnea COLON POLYPS Asthma  Past Surgical History: Reviewed history from 04/21/2008 and no changes required. Hysterectomy--partial Appendectomy Colonoscopy LAST DONE 5/09 Breast reduction TAH-->BSO cervical fusion  Family History: Reviewed history from 01/28/2008 and no changes required. Family History Diabetes 1st degree relative Family History of CAD Female 1st degree relative <60  Social History: Reviewed history from 02/13/2009 and no changes required. Married Never Smoked Alcohol use-no Occupation: [employer] Drug use-no Regular exercise-no 2 children  Review of Systems       The patient complains of shortness of breath with activity, productive cough, non-productive cough, irregular heartbeats, abdominal pain, sore throat, headaches, nasal congestion/difficulty breathing through nose, sneezing, and depression.  The patient denies shortness of breath at rest, coughing up blood, chest pain, acid heartburn, indigestion, loss of appetite, weight change, difficulty swallowing, tooth/dental problems, itching, ear ache, anxiety, hand/feet swelling, joint stiffness or pain, rash, change in color of  mucus, and fever.    Vital Signs:  Patient profile:   55 year old female Height:      65 inches Weight:      230.13 pounds BMI:      38.43 O2 Sat:      98 % on Room air Temp:     99.5 degrees F oral Pulse rate:   100 / minute BP sitting:   120 / 80  (left arm) Cuff size:   large  Vitals Entered By: Arman Filter LPN (January 25, 2010 9:50 AM)  O2 Flow:  Room air CC: Sick visit.  RA's pt.  Increased cough with yellow sputum x 3 days.  States abd sore d/t increased coughing.  Comments Medications reviewed with patient Arman Filter LPN  January 25, 2010 9:50 AM    Physical Exam  General:  ow female in nad nasal voice, appears uncomfortable. Nose:  swollen turbinates with mucosal erythema no purulence seen Mouth:  no lesions or exudates seen Lungs:  totally clear to auscultation.  Good airflow and no wheezing +upper airway pseudowheezing Heart:  rrr, no mrg Extremities:  no edema or cyanosis  Neurologic:  alert and oriented, moves all 4.   Impression & Recommendations:  Problem # 1:  ACUTE BRONCHITIS (ICD-466.0)  the pt's symptoms are most c/w an acute bronchitis.  She has no wheezing on exam and has adequate airflow.  Will therefore hold off on systemic steroids.  I have also reviewed pulmonary hygiene that she can do at home that may help symptoms.  She will stay on her bronchodilators, as well as her omeprazole for possible LPR.    She will call if not improving.  Medications Added to Medication List This Visit: 1)  Promethazine-codeine 6.25-10 Mg/27ml Syrp (Promethazine-codeine) .... 1/2 to 1 tsp by mouth at bedtime as needed 2)  Symbicort 160-4.5 Mcg/act Aero (Budesonide-formoterol fumarate) .... Two puffs twice daily 3)  Tessalon Perles 100 Mg Caps (Benzonatate) .... Take 2 tabs by mouth three times a day 4)  Cefdinir 300 Mg Caps (Cefdinir) .... Two by mouth each am 5)  Prilosec 20 Mg Cpdr (Omeprazole) .... Take 1 tablet by mouth once a day 6)  Mucinex Dm Maximum Strength 60-1200 Mg Xr12h-tab (Dextromethorphan-guaifenesin) .... Take 1 tablet by mouth two times a day  Other Orders: Est. Patient Level  IV ([address])  Patient Instructions: 1)  will treat you with omnicef 300mg  2 each am for 5 days. 2)  drink plenty of fluids, take mucinex dm double strength one in am and pm until better. 3)  stay on your symbicort and omeprazole (for reflux) 4)  can use nasal saline spray or neilmed sinus rinses to open up nose.  Can also use afrin 2 sprays each nostril twice a day for 3 days ONLY.   5)  Keep followup with Dr. Vassie Loll, but call us if not getting better.  Prescriptions: CEFDINIR 300 MG CAPS (CEFDINIR) two by mouth each am  #10 x 0   Entered and Authorized by:   Barbaraann Share MD   Signed by:   Barbaraann Share MD on 01/25/2010   Method used:   Print then Give to Patient   RxID:   [id number]

## 2010-02-07 NOTE — Letter (Signed)
Summary: Work Dietitian at [employer]  [address] [place], [place] [address]   Phone: [phone]  Fax: [phone]    Today's Date: January 28, 2010  Name of Patient: [name]  The above named patient had a medical visit today at:  9am.  Please take this into consideration when reviewing the time away from work/school.    Special Instructions:  [  ] None  [ X ] To be off the remainder of today, returning to the normal work / school schedule tomorrow.  [  ] To be off until the next scheduled appointment on ______________________.  [  ] Other ________________________________________________________________ ________________________________________________________________________   Sincerely yours,   Loreen Freud DO

## 2010-02-07 NOTE — Assessment & Plan Note (Signed)
Summary: 1 month rov/per [name]//sh   Visit Type:  Follow-up Copy to:  Dr. Loreen Freud Primary Provider/Referring Provider:  Loreen Freud DO  CC:  1 month follow up. Pt c/o dry hacky cough but better and nasal congestion. Pt request to be tested for allergies. Pt states is only using Symbicort prn due to it causing her heart to race.  History of Present Illness: 55/F, never smoker for FU of episodic asthmatic bronchitis This started with a head & chest cold in october '10, persisted on & off x 3 mnths, CXR 1/19 no infx or effusion. Prednisone has caused weight gain.  History does not indicate any environmental trigger. Pro- air causes palpitations Initial impresion was :Asthmatic bronchitis vs reactive airway disease - no clear trigger Much improved by Feb with  advair 500/50 , singulair & Cheratussin.  April 02, 2009-- acute OV. c/o  prod cough with yellow mucus, wheezing x3days. Says the pollen, flowers make her symptoms worse.  Could not afford Advair or Singulair.   December 27, 2009 12:17 PM  - FU from acute OV on nov 29 c/o prod cough with yellow/green mucus, sinus pressure/congestion with same colored mucus, wheezing, increased SOB, sweats x5days. Previously recommended to take Advair and singulair however did not fill after samples ran out. Last seen 6 months ago says she did fine until last week when she got this cold. Husband and grandson have had similar symptoms >> Given ABx, pred taper x 8ds, augmentin caused diarrhea, changed to z-pak. Better but cough is persistent, now clear - dry. Occasional heartburn  January 30, 2010 2:02 PM  moved into new house since july '11, given cefdinir x 5 ds by dr Shelle Iron for bronchitis, grandson sick, wonders if she has allergies, symbicort makes her heart race, prefers advair, RAST neg  Denies chest pain,  orthopnea, hemoptysis, fever, n/v/d, edema, headache.  Current Medications (verified): 1)  Calcium Carbonate-Vitamin D 600-400 Mg-Unit   Tabs (Calcium Carbonate-Vitamin D) .... Take 1 Tablet By Mouth Once A Day 2)  Multivitamins   Tabs (Multiple Vitamin) .... Take 1 Tablet By Mouth Once A Day 3)  Exforge 5-160 Mg Tabs (Amlodipine Besylate-Valsartan) .... Take 1 Tab Once Daily 4)  Premarin 0.45 Mg Tabs (Estrogens Conjugated) .[name].. 1 By Mouth Once Daily 5)  Fluticasone Propionate 50 Mcg/act Susp (Fluticasone Propionate) .... 2 Puffs  Two Times A Day 6)  Zyrtec Allergy 10 Mg Caps (Cetirizine Hcl) .[name].. 1 By Mouth At Bedtime As Needed 7)  Proair Hfa 108 (90 Base) Mcg/act Aers (Albuterol Sulfate) .... As Needed 8)  Hydrochlorothiazide 12.5 Mg Caps (Hydrochlorothiazide) .... Take 1 Tablet By Mouth Once A Day 9)  Symbicort 160-4.5 Mcg/act  Aero (Budesonide-Formoterol Fumarate) .... Two Puffs Twice Daily 10)  Tessalon Perles 100 Mg Caps (Benzonatate) .... Take 2 Tabs By Mouth Three Times A Day 11)  Mucinex Dm Maximum Strength 60-1200 Mg Xr12h-Tab (Dextromethorphan-Guaifenesin) .... Take 1 Tablet By Mouth Two Times A Day 12)  Omeprazole 20 Mg Cpdr (Omeprazole) .... By Mouth Once Daily  Allergies (verified): 1)  ! Adhesive Bandages Fexible (Adhesive Bandages)  Past History:  Past Medical History: Last updated: 01/23/2009 Anemia-NOS Atrial fibrillation: PAF initially documented in 2005 and again in 07/2006 Headache Hypertension GERD Gastritis fatty Liver Diverticulosis Internal Hemorroids depression Sleep apnea COLON POLYPS Asthma  Social History: Last updated: 02/13/2009 Married Never Smoked Alcohol use-no Occupation: [employer] Drug use-no Regular exercise-no 2 children  Review of Systems       The patient complains  of dyspnea on exertion and prolonged cough.  The patient denies anorexia, fever, weight loss, weight gain, vision loss, decreased hearing, hoarseness, chest pain, syncope, peripheral edema, headaches, hemoptysis, abdominal pain, melena, hematochezia, severe indigestion/heartburn, hematuria, muscle weakness,  suspicious skin lesions, transient blindness, difficulty walking, depression, unusual weight change, abnormal bleeding, enlarged lymph nodes, and angioedema.    Vital Signs:  Patient profile:   55 year old female Height:      65 inches Weight:      234 pounds BMI:     39.08 O2 Sat:      94 % on Room air Temp:     98.6 degrees F oral Pulse rate:   83 / minute BP sitting:   130 / 82  (left arm) Cuff size:   large  Vitals Entered By: Zackery Barefoot CMA (January 30, 2010 1:46 PM)  O2 Flow:  Room air CC: 1 month follow up. Pt c/o dry hacky cough but better, nasal congestion. Pt request to be tested for allergies. Pt states is only using Symbicort prn due to it causing her heart to race Comments Medications reviewed with patient Verified contact number and pharmacy with patient Zackery Barefoot CMA  January 30, 2010 1:46 PM    Physical Exam  Additional Exam:  Gen. Pleasant, well-nourished, in no distress, normal affect ENT - no lesions, pale nasal mucosa w/ clear discharge, class 2 airway .  Neck: No JVD, no thyromegaly, no carotid bruits Lungs: coarse BS w/ exp wheezing  Cardiovascular: Rhythm regular, heart sounds  normal, no murmurs or gallops, no peripheral edema Musculoskeletal: No deformities, no cyanosis or clubbing      Impression & Recommendations:  Problem # 1:  ACUTE BRONCHITIS (ICD-466.0) Will treat as sino bronchitis - cefdinir has not knocked this off, use z-pak  Take advair instead of symbicort - palpitations - sample given RAST neg The following medications were removed from the medication list:    Promethazine-codeine 6.25-10 Mg/81ml Syrp (Promethazine-codeine) .[name]... 1/2 to 1 tsp by mouth at bedtime as needed    Symbicort 160-4.5 Mcg/act Aero (Budesonide-formoterol fumarate) .[name]..[name] Two puffs twice daily    Cefdinir 300 Mg Caps (Cefdinir) .[name]..[name] Two by mouth each am    Benzonatate 100 Mg Caps (Benzonatate) .[name]... 1 by mouth two times a day    Cefdinir 300 Mg Caps  (Cefdinir) ..... By mouth once daily Her updated medication list for this problem includes:    Proair Hfa 108 (90 Base) Mcg/act Aers (Albuterol sulfate) .[name]... As needed    Tessalon Perles 100 Mg Caps (Benzonatate) .[name]... Take 2 tabs by mouth three times a day    Mucinex Dm Maximum Strength 60-1200 Mg Xr12h-tab (Dextromethorphan-guaifenesin) .[name]... Take 1 tablet by mouth two times a day    Azithromycin 500 Mg Tabs (Azithromycin) ..... Once daily    Advair Diskus 100-50 Mcg/dose Aepb (Fluticasone-salmeterol) .[name]... 1 puff two times a day  Orders: Est. Patient Level III (04540) Prescription Created Electronically [phone]) T-"RAST" (Allergy Full Profile) IGE ([id number])  Problem # 2:  GERD (ICD-530.81)  The following medications were removed from the medication list:    Prilosec 20 Mg Cpdr (Omeprazole) .[name]... Take 1 tablet by mouth once a day Her updated medication list for this problem includes:    Omeprazole 20 Mg Cpdr (Omeprazole) ..... By mouth once daily  Problem # 3:  SLEEP APNEA (ICD-780.57) Assessment: Comment Only Evaluation in future once acute symptoms resolved.  Medications Added to Medication List This Visit: 1)  Zyrtec Allergy  10 Mg Caps (Cetirizine hcl) .[name].. 1 by mouth at bedtime as needed 2)  Azithromycin 500 Mg Tabs (Azithromycin) .... Once daily 3)  Advair Diskus 100-50 Mcg/dose Aepb (Fluticasone-salmeterol) .[name].. 1 puff two times a day  Patient Instructions: 1)  Copy sent to:Dr Lowne 2)  Please schedule a follow-up appointment in 2 months with TP 3)  Blood test for allergies 4)  STOP symbicort, use advair 100/50 instead 5)  Pro air 2 puffs every 4h as needed  6)  Z-pak - antibiotic 7)  If no better in 5 ds, call - may need prednisone Prescriptions: AZITHROMYCIN 500 MG TABS (AZITHROMYCIN) once daily  #5 x 0   Entered and Authorized by:   Comer Locket Vassie Loll MD   Signed by:   Comer Locket Vassie Loll MD on 01/30/2010   Method used:   Electronically to        [facility]  (retail)       [address]       [place], [place]  [address]       Ph: [phone]       Fax: [phone]   RxID:   [id number]

## 2010-02-07 NOTE — Assessment & Plan Note (Signed)
Summary: PALPITATION-NO CHEST PAIN OR SOB-INSTRUCTED ER IF WORSE/CDJ   Vital Signs:  Patient profile:   55 year old female Weight:      229.4 pounds Pulse rate:   64 / minute Pulse rhythm:   regular BP sitting:   158 / 96  (left arm) Cuff size:   large  Vitals Entered By: Almeta Monas CMA Duncan Dull) (January 11, 2010 11:56 AM) CC: c/o palpitations since last night   History of Present Illness: Pt here c/o palpatations last night.   She has not taken her bp med since before Christmas.  She ran out.  no CP, SOB.    Current Medications (verified): 1)  Calcium Carbonate-Vitamin D 600-400 Mg-Unit  Tabs (Calcium Carbonate-Vitamin D) .... Take 1 Tablet By Mouth Once A Day 2)  Multivitamins   Tabs (Multiple Vitamin) .... Take 1 Tablet By Mouth Once A Day 3)  Exforge 5-160 Mg Tabs (Amlodipine Besylate-Valsartan) .... Take 1 Tab Once Daily 4)  Premarin 0.45 Mg Tabs (Estrogens Conjugated) .[name].. 1 By Mouth Once Daily 5)  Fluticasone Propionate 50 Mcg/act Susp (Fluticasone Propionate) .... 2 Puffs  Two Times A Day 6)  Zyrtec Allergy 10 Mg Caps (Cetirizine Hcl) .[name].. 1 By Mouth At Bedtime 7)  Proair Hfa 108 (90 Base) Mcg/act Aers (Albuterol Sulfate) .... As Needed 8)  Hydrochlorothiazide 12.5 Mg Caps (Hydrochlorothiazide) .... Take 1 Tablet By Mouth Once A Day 9)  [facility]vs Omeprazole 20 Mg Tbec (Omeprazole) .... Once Daily 10)  Benzonatate 200 Mg Caps (Benzonatate) .... Three Times A Day 11)  Promethazine-Codeine 6.25-10 Mg/3ml Syrp (Promethazine-Codeine) .... 2.5- 5ml By Mouth At Bedtime As Needed 12)  Proctozone-Hc 2.5 % Crea (Hydrocortisone) .... Apply To Hemorrhoids As Needed  Allergies (verified): 1)  ! Adhesive Bandages Fexible (Adhesive Bandages)  Past History:  Past medical, surgical, family and social histories (including risk factors) reviewed for relevance to current acute and chronic problems.  Past Medical History: Reviewed history from 01/23/2009 and no changes  required. Anemia-NOS Atrial fibrillation: PAF initially documented in 2005 and again in 07/2006 Headache Hypertension GERD Gastritis fatty Liver Diverticulosis Internal Hemorroids depression Sleep apnea COLON POLYPS Asthma  Past Surgical History: Reviewed history from 04/21/2008 and no changes required. Hysterectomy--partial Appendectomy Colonoscopy LAST DONE 5/09 Breast reduction TAH-->BSO cervical fusion  Family History: Reviewed history from 01/28/2008 and no changes required. Family History Diabetes 1st degree relative Family History of CAD Female 1st degree relative <60  Social History: Reviewed history from 02/13/2009 and no changes required. Married Never Smoked Alcohol use-no Occupation: [employer] Drug use-no Regular exercise-no 2 children  Review of Systems      See HPI  Physical Exam  General:  Well-developed,well-nourished,in no acute distress; alert,appropriate and cooperative throughout examination Neck:  No deformities, masses, or tenderness noted. Lungs:  Normal respiratory effort, chest expands symmetrically. Lungs are clear to auscultation, no crackles or wheezes. Heart:  normal rate and no murmur.   Extremities:  No clubbing, cyanosis, edema, or deformity noted with normal full range of motion of all joints.   Skin:  Intact without suspicious lesions or rashes Psych:  Cognition and judgment appear intact. Alert and cooperative with normal attention span and concentration. No apparent delusions, illusions, hallucinations   Impression & Recommendations:  Problem # 1:  PALPITATIONS, HX OF (ICD-V12.50)  Orders: Venipuncture (28413) TLB-BMP (Basic Metabolic Panel-BMET) (80048-METABOL) TLB-TSH (Thyroid Stimulating Hormone) (84443-TSH) TLB-Hepatic/Liver Function Pnl (80076-HEPATIC) TLB-CBC Platelet - w/Differential (85025-CBCD)  Problem # 2:  HYPERTENSION (ICD-401.9)  Her updated medication  list for this problem includes:    Exforge 5-160 Mg  Tabs (Amlodipine besylate-valsartan) .[name]... Take 1 tab once daily    Hydrochlorothiazide 12.5 Mg Caps (Hydrochlorothiazide) .[name]... Take 1 tablet by mouth once a day  BP today: 158/96 Prior BP: 126/80 (12/26/2009)  Labs Reviewed: K+: 4.2 (11/14/2009) Creat: : 0.8 (11/14/2009)   Chol: 134 (11/14/2009)   HDL: 38.20 (11/14/2009)   LDL: 77 (11/14/2009)   TG: 94.0 (11/14/2009)  Complete Medication List: 1)  Calcium Carbonate-vitamin D 600-400 Mg-unit Tabs (Calcium carbonate-vitamin d) .... Take 1 tablet by mouth once a day 2)  Multivitamins Tabs (Multiple vitamin) .... Take 1 tablet by mouth once a day 3)  Exforge 5-160 Mg Tabs (Amlodipine besylate-valsartan) .... Take 1 tab once daily 4)  Premarin 0.45 Mg Tabs (Estrogens conjugated) .[name].. 1 by mouth once daily 5)  Fluticasone Propionate 50 Mcg/act Susp (Fluticasone propionate) .... 2 puffs  two times a day 6)  Zyrtec Allergy 10 Mg Caps (Cetirizine hcl) .[name].. 1 by mouth at bedtime 7)  Proair Hfa 108 (90 Base) Mcg/act Aers (Albuterol sulfate) .... As needed 8)  Hydrochlorothiazide 12.5 Mg Caps (Hydrochlorothiazide) .... Take 1 tablet by mouth once a day 9)  Cvs Omeprazole 20 Mg Tbec (Omeprazole) .... Once daily 10)  Benzonatate 200 Mg Caps (Benzonatate) .... Three times a day 11)  Promethazine-codeine 6.25-10 Mg/64ml Syrp (Promethazine-codeine) .... 2.5- 5ml by mouth at bedtime as needed 12)  Proctozone-hc 2.5 % Crea (Hydrocortisone) .... Apply to hemorrhoids as needed  Other Orders: TLB-Lipid Panel (80061-LIPID)  Patient Instructions: 1)  Please schedule a follow-up appointment in 2 weeks.  Prescriptions: HYDROCHLOROTHIAZIDE 12.5 MG CAPS (HYDROCHLOROTHIAZIDE) Take 1 tablet by mouth once a day  #100 x 2   Entered and Authorized by:   Loreen Freud DO   Signed by:   Loreen Freud DO on 01/11/2010   Method used:   Electronically to        [facility] (retail)       [address]       [place], [place]  [address]        Ph: [phone]       Fax: [phone]   RxID:   [id number] EXFORGE 5-160 MG TABS (AMLODIPINE BESYLATE-VALSARTAN) tAKE 1 tab once daily  #90 x 3   Entered and Authorized by:   Loreen Freud DO   Signed by:   Loreen Freud DO on 01/11/2010   Method used:   Electronically to        [facility] [address] (retail)       [address]       [place], [place]  [address]       Ph: [phone]       Fax: [phone]   RxID:   [id number]    Orders Added: 1)  Venipuncture [88416] 2)  TLB-BMP (Basic Metabolic Panel-BMET) [80048-METABOL] 3)  TLB-TSH (Thyroid Stimulating Hormone) [84443-TSH] 4)  TLB-Hepatic/Liver Function Pnl [80076-HEPATIC] 5)  TLB-CBC Platelet - w/Differential [85025-CBCD] 6)  TLB-Lipid Panel [80061-LIPID]

## 2010-02-12 ENCOUNTER — Encounter: Payer: Self-pay | Admitting: Gastroenterology

## 2010-02-12 ENCOUNTER — Ambulatory Visit (INDEPENDENT_AMBULATORY_CARE_PROVIDER_SITE_OTHER): Payer: 59 | Admitting: Gastroenterology

## 2010-02-12 DIAGNOSIS — K921 Melena: Secondary | ICD-10-CM

## 2010-02-12 DIAGNOSIS — R141 Gas pain: Secondary | ICD-10-CM

## 2010-02-12 DIAGNOSIS — R142 Eructation: Secondary | ICD-10-CM

## 2010-02-12 DIAGNOSIS — K219 Gastro-esophageal reflux disease without esophagitis: Secondary | ICD-10-CM

## 2010-02-12 DIAGNOSIS — Z86010 Personal history of colonic polyps: Secondary | ICD-10-CM

## 2010-02-21 NOTE — Procedures (Signed)
Summary: Flexible Sigmoidoscopy/[facility]  Flexible Sigmoidoscopy/[facility]   Imported By: Sherian Rein 02/12/2010 13:51:51  _____________________________________________________________________  External Attachment:    Type:   Image     Comment:   External Document

## 2010-02-21 NOTE — Procedures (Signed)
Summary: Upper Endoscopy/[place]  Upper Endoscopy/[place]   Imported By: Sherian Rein 02/12/2010 13:47:52  _____________________________________________________________________  External Attachment:    Type:   Image     Comment:   External Document

## 2010-02-21 NOTE — Procedures (Signed)
Summary: [facility]   Imported By: Sherian Rein 02/12/2010 13:49:48  _____________________________________________________________________  External Attachment:    Type:   Image     Comment:   External Document

## 2010-02-21 NOTE — Assessment & Plan Note (Signed)
Summary: F/u for rectal bleeding    History of Present Illness Visit Type: Follow-up Visit Primary GI MD: Elie Goody MD [facility] Primary Provider: Loreen Freud DO Requesting Provider: na Chief Complaint: F/u for rectal bleeding. Pt states that she is better and denies any GI complaints  History of Present Illness:   This is a 55 year old female, who was taken several courses of antibiotics for bronchitis over the past few months. During one course of antibiotics she developed diarrhea associated with small amounts of rectal bleeding. She underwent colonoscopy in May 2009, which showed hemorrhoids, diverticulosis, and hyperplastic colon polyps. She is prescribed a hemorrhoidal cream for presumed hemorrhoidal bleeding and her rectal bleeding resolved within 5 days and has not recurred. She notes occasional upper intestinal gas discomfort.   GI Review of Systems    Reports bloating.      Denies abdominal pain, acid reflux, belching, chest pain, dysphagia with liquids, dysphagia with solids, heartburn, loss of appetite, nausea, vomiting, vomiting blood, weight loss, and  weight gain.        Denies anal fissure, black tarry stools, change in bowel habit, constipation, diarrhea, diverticulosis, fecal incontinence, heme positive stool, hemorrhoids, irritable bowel syndrome, jaundice, light color stool, liver problems, rectal bleeding, and  rectal pain.   Current Medications (verified): 1)  Calcium Carbonate-Vitamin D 600-400 Mg-Unit  Tabs (Calcium Carbonate-Vitamin D) .... Take 1 Tablet By Mouth Once A Day 2)  Multivitamins   Tabs (Multiple Vitamin) .... Take 1 Tablet By Mouth Once A Day 3)  Exforge 5-160 Mg Tabs (Amlodipine Besylate-Valsartan) .... Take 1 Tab Once Daily 4)  Premarin 0.45 Mg Tabs (Estrogens Conjugated) .[name].. 1 By Mouth Once Daily 5)  Fluticasone Propionate 50 Mcg/act Susp (Fluticasone Propionate) .... 2 Puffs  Two Times A Day 6)  Zyrtec Allergy 10 Mg Caps (Cetirizine Hcl) .[name].. 1  By Mouth At Bedtime As Needed 7)  Proair Hfa 108 (90 Base) Mcg/act Aers (Albuterol Sulfate) .... As Needed 8)  Hydrochlorothiazide 12.5 Mg Caps (Hydrochlorothiazide) .... Take 1 Tablet By Mouth Once A Day 9)  Omeprazole 20 Mg Cpdr (Omeprazole) .... By Mouth Once Daily 10)  Advair Diskus 100-50 Mcg/dose Aepb (Fluticasone-Salmeterol) .[name].. 1 Puff Two Times A Day 11)  Activia Yogurt .... Once Daily 12)  Stool Softener 250 Mg Caps (Docusate Sodium) .... One Capsule By Mouth Once Daily  Allergies (verified): 1)  ! Adhesive Bandages Fexible (Adhesive Bandages)  Past History:  Past Medical History: Reviewed history from 02/07/2010 and no changes required. 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 Hysterectomy--partial Appendectomy Colonoscopy LAST DONE 5/09 Breast reduction TAH-->BSO cervical fusion  Family History: Family History Diabetes 1st degree relative Family History of CAD Female 1st degree relative <60 No FH of Colon Cancer:  Social History: Reviewed history from 02/13/2009 and no changes required. Married Never Smoked Alcohol use-no Occupation: [employer] Drug use-no Regular exercise-no 2 children  Review of Systems       The pertinent positives and negatives are noted as above and in the HPI. All other ROS were reviewed and were negative.]  Vital Signs:  Patient profile:   55 year old female Height:      65 inches Weight:      229 pounds BMI:     38.25 BSA:  2.10 Pulse rate:   88 / minute Pulse rhythm:   regular BP sitting:   134 / 76  (left arm) Cuff size:   large  Vitals Entered By: Ok Anis CMA (February 12, 2010 10:03 AM)  Physical Exam  General:  Well  developed, well nourished, no acute distress. Head:  Normocephalic and atraumatic. Eyes:  PERRLA, no icterus. Ears:  Normal auditory acuity. Mouth:  No deformity or lesions, dentition normal. Lungs:  Clear throughout to auscultation. Heart:  Regular rate and rhythm; no murmurs, rubs,  or bruits. Abdomen:  Soft, nontender and nondistended. No masses, hepatosplenomegaly or hernias noted. Normal bowel sounds. Neurologic:  Alert and  oriented x4;  grossly normal neurologically. Psych:  Alert and cooperative. Normal mood and affect.  Impression & Recommendations:  Problem # 1:  HEMATOCHEZIA (ICD-578.1) Small voume hematochezia, that has resolved. Suspect symptoms were related to internal hemorrhoids. If she has recurrent episodes of bleeding that are not adequately controlled with hemorrhoidal medications will proceed with colonoscopy for further evaluation.  Problem # 2:  FLATULENCE-GAS-BLOATING (ICD-787.3) Begin a low gas diet, along with Gas-X q.i.d. p.r.n., and a probiotic.  Problem # 3:  FATTY LIVER DISEASE (ICD-571.8) Long-term weight-loss program with low fat diet supervised her primary physician  Problem # 4:  PERSONAL HX COLONIC POLYPS (ICD-V12.72) Personal history of adenomatous colon polyps. Surveillance colonoscopy in May 2014.  Problem # 5:  OBESITY NOS (ICD-278.00)  Patient Instructions: 1)  Start Align once capsule by mouth once daily x 1 month and continue if the medication helps.  2)  Excessive Gas Diet handout given.  3)  Use Gas-X one capsule by mouth four times a day as needed. 4)  Copy sent to : Loreen Freud, DO 5)  The medication list was reviewed and reconciled.  All changed / newly prescribed medications were explained.  A complete medication list was provided to the patient / caregiver.

## 2010-04-02 ENCOUNTER — Encounter: Payer: Self-pay | Admitting: Adult Health

## 2010-04-05 ENCOUNTER — Ambulatory Visit: Payer: Self-pay | Admitting: Adult Health

## 2010-04-15 LAB — COMPREHENSIVE METABOLIC PANEL
ALT: 28 U/L (ref 0–35)
AST: 27 U/L (ref 0–37)
Albumin: 3.8 g/dL (ref 3.5–5.2)
Alkaline Phosphatase: 64 U/L (ref 39–117)
BUN: 8 mg/dL (ref 6–23)
CO2: 26 mEq/L (ref 19–32)
Calcium: 9.2 mg/dL (ref 8.4–10.5)
Chloride: 102 mEq/L (ref 96–112)
Creatinine, Ser: 0.89 mg/dL (ref 0.4–1.2)
GFR calc Af Amer: >60 mL/min (ref >60)
GFR calc non Af Amer: >60 mL/min (ref >60)
Glucose, Bld: 120 mg/dL — ABNORMAL HIGH (ref 70–99)
Potassium: 3.5 mEq/L (ref 3.5–5.1)
Sodium: 137 mEq/L (ref 135–145)
Total Bilirubin: 0.6 mg/dL (ref 0.3–1.2)
Total Protein: 8.2 g/dL (ref 6.0–8.3)

## 2010-04-15 LAB — LIPID PANEL
Cholesterol: 139 mg/dL (ref 0–200)
HDL: 38 mg/dL — ABNORMAL LOW (ref >39)
LDL Cholesterol: 75 mg/dL (ref 0–99)
Total CHOL/HDL Ratio: 3.7 RATIO
Triglycerides: 128 mg/dL (ref <150)
VLDL: 26 mg/dL (ref 0–40)

## 2010-04-15 LAB — DIFFERENTIAL
Basophils Absolute: 0 10*3/uL (ref 0.0–0.1)
Basophils Relative: 0 % (ref 0–1)
Eosinophils Absolute: 0.2 10*3/uL (ref 0.0–0.7)
Eosinophils Relative: 2 % (ref 0–5)
Lymphocytes Relative: 36 % (ref 12–46)
Lymphs Abs: 2.5 10*3/uL (ref 0.7–4.0)
Monocytes Absolute: 0.5 10*3/uL (ref 0.1–1.0)
Monocytes Relative: 7 % (ref 3–12)
Neutro Abs: 3.8 10*3/uL (ref 1.7–7.7)
Neutrophils Relative %: 54 % (ref 43–77)

## 2010-04-15 LAB — POCT CARDIAC MARKERS
CKMB, poc: <1 ng/mL — ABNORMAL LOW (ref 1.0–8.0)
CKMB, poc: <1 ng/mL — ABNORMAL LOW (ref 1.0–8.0)
CKMB, poc: <1 ng/mL — ABNORMAL LOW (ref 1.0–8.0)
Myoglobin, poc: 61.4 ng/mL (ref 12–200)
Myoglobin, poc: 64.7 ng/mL (ref 12–200)
Myoglobin, poc: 67 ng/mL (ref 12–200)
Troponin i, poc: <0.05 ng/mL (ref 0.00–0.09)
Troponin i, poc: <0.05 ng/mL (ref 0.00–0.09)
Troponin i, poc: <0.05 ng/mL (ref 0.00–0.09)

## 2010-04-15 LAB — CK TOTAL AND CKMB (NOT AT ARMC)
CK, MB: 1 ng/mL (ref 0.3–4.0)
Relative Index: INVALID (ref 0.0–2.5)
Total CK: 84 U/L (ref 7–177)

## 2010-04-15 LAB — APTT: aPTT: 33 seconds (ref 24–37)

## 2010-04-15 LAB — LIPASE, BLOOD: Lipase: 27 U/L (ref 11–59)

## 2010-04-15 LAB — TSH: TSH: 1.602 u[IU]/mL (ref 0.350–4.500)

## 2010-04-15 LAB — CBC
HCT: 41 % (ref 36.0–46.0)
Hemoglobin: 14.2 g/dL (ref 12.0–15.0)
MCHC: 34.6 g/dL (ref 30.0–36.0)
MCV: 96.3 fL (ref 78.0–100.0)
Platelets: 314 10*3/uL (ref 150–400)
RBC: 4.26 MIL/uL (ref 3.87–5.11)
RDW: 13.8 % (ref 11.5–15.5)
WBC: 7 10*3/uL (ref 4.0–10.5)

## 2010-04-15 LAB — HEMOGLOBIN A1C
Hgb A1c MFr Bld: 5.7 % (ref 4.6–6.1)
Mean Plasma Glucose: 117 mg/dL

## 2010-04-15 LAB — PROTIME-INR
INR: 1.1 (ref 0.00–1.49)
Prothrombin Time: 14.9 seconds (ref 11.6–15.2)

## 2010-04-15 LAB — TROPONIN I: Troponin I: 0.01 ng/mL (ref 0.00–0.06)

## 2010-04-15 LAB — CARDIAC PANEL(CRET KIN+CKTOT+MB+TROPI)
CK, MB: 0.8 ng/mL (ref 0.3–4.0)
Relative Index: INVALID (ref 0.0–2.5)
Total CK: 90 U/L (ref 7–177)
Troponin I: 0.01 ng/mL (ref 0.00–0.06)

## 2010-04-29 ENCOUNTER — Other Ambulatory Visit: Payer: Self-pay

## 2010-04-29 MED ORDER — HYDROCHLOROTHIAZIDE 12.5 MG PO CAPS: 12.5000 mg | ORAL_CAPSULE | Freq: Every day | ORAL | Status: DC

## 2010-04-29 MED ORDER — AMLODIPINE BESYLATE-VALSARTAN 5-160 MG PO TABS: 1.0000 | ORAL_TABLET | Freq: Every day | ORAL | Status: DC

## 2010-04-30 ENCOUNTER — Encounter: Payer: Self-pay | Admitting: Family Medicine

## 2010-04-30 ENCOUNTER — Ambulatory Visit (INDEPENDENT_AMBULATORY_CARE_PROVIDER_SITE_OTHER): Payer: 59 | Admitting: Family Medicine

## 2010-04-30 DIAGNOSIS — J329 Chronic sinusitis, unspecified: Secondary | ICD-10-CM | POA: Insufficient documentation

## 2010-04-30 DIAGNOSIS — J45909 Unspecified asthma, uncomplicated: Secondary | ICD-10-CM

## 2010-04-30 MED ORDER — BENZONATATE 200 MG PO CAPS: 200.0000 mg | ORAL_CAPSULE | Freq: Three times a day (TID) | ORAL | Status: AC | PRN

## 2010-04-30 MED ORDER — AMOXICILLIN-POT CLAVULANATE 875-125 MG PO TABS: 1.0000 | ORAL_TABLET | Freq: Two times a day (BID) | ORAL | Status: AC

## 2010-04-30 NOTE — Progress Notes (Signed)
  Subjective:    Patient ID: [name], female    DOB: Nov 01, 1955, 55 y.o.   MRN: [id number]  HPI URI- sxs started Sunday w/ nasal and chest congestion.  Developed chills.  'i felt horrible'.  Tm 101.2.  + sore throat, painful swallowing.  No ear pain.  + sinus congestion.  + cough- productive of yellow/green mucous, some blood tinged.  + sick contacts.  Hx of asthma- increased SOB.   Review of Systems For ROS see HPI     Objective:   Physical Exam  Constitutional: She appears well-nourished.       Obviously uncomfortable  HENT:  Head: Normocephalic and atraumatic.  Right Ear: Tympanic membrane normal.  Left Ear: Tympanic membrane normal.  Nose: Mucosal edema and rhinorrhea present. Right sinus exhibits maxillary sinus tenderness and frontal sinus tenderness. Left sinus exhibits maxillary sinus tenderness and frontal sinus tenderness.  Mouth/Throat: Uvula is midline and mucous membranes are normal. Posterior oropharyngeal erythema present. No oropharyngeal exudate.  Eyes: Conjunctivae and EOM are normal. Pupils are equal, round, and reactive to light.  Neck: Normal range of motion. Neck supple.  Cardiovascular: Normal rate, regular rhythm and normal heart sounds.   Pulmonary/Chest: Effort normal. No respiratory distress. She has wheezes.       No cough heard during visit  Lymphadenopathy:    She has cervical adenopathy.          Assessment & Plan:

## 2010-04-30 NOTE — Assessment & Plan Note (Signed)
Pt w/ + TTP over sinuses.  Start Augmentin.  Reviewed supportive care and red flags that should prompt return.  Pt expressed understanding and is in agreement w/ plan.

## 2010-04-30 NOTE — Assessment & Plan Note (Signed)
Pt was wheezing on initial exam- this cleared after albuterol neb.  Encouraged pt to use her albuterol inhaler regularly while not feeling well.  Continue Advair daily as her controller med.  Pt to call if increased SOB or wheezing.  Pt expressed understanding and is in agreement w/ plan.

## 2010-04-30 NOTE — Patient Instructions (Signed)
This appears to be a sinus infection/bronchitis Take the Augmentin as directed- take w/ food to avoid upset stomach Use your Albuterol inhaler every 4 hrs for shortness of breath and wheezing Take the Advair as directed Continue the Mucinex daily Take the Tessalon as directed for cough Hang in there!!!

## 2010-05-03 ENCOUNTER — Ambulatory Visit: Payer: 59 | Admitting: Pulmonary Disease

## 2010-05-21 NOTE — Assessment & Plan Note (Signed)
[facility]                         GASTROENTEROLOGY OFFICE NOTE   [name], [name]                     MRN:          [id number]  DATE:11/02/2006                            DOB:          1955-01-29    This is a return office visit for left upper quadrant pain and bloating.  Her left upper quadrant pain has resolved and her bloating has improved.  She notes a new symptom of right lower extremity edema for the past week  or two.  Her recent blood work was remarkable for minimally elevated ALT  at 39.  Recent CT scan of the abdomen and pelvis revealed colonic  diverticulosis, fatty infiltration of the liver, a small left adrenal  gland myelolipoma and no other abnormalities.  Her symptoms improved  after discontinuing black cohosh and beginning Zegerid 40 mg q.a.m.   CURRENT MEDICATIONS:  Listed on the chart, updated and reviewed.   ALLERGIES:  No known drug allergies.   PHYSICAL EXAMINATION:  VITAL SIGNS:  Weight 236.8, blood pressure  130/78, pulse 76 and regular.  GENERAL APPEARANCE:  Overweight, no acute distress.  ABDOMEN:  Large, soft, nontender, nondistended, normal active bowel  sounds, no palpable organomegaly, masses or hernias.  EXTREMITIES:  Exam reveals 2+ ankle and pretibial edema on the right.  No edema on the left.   ASSESSMENT/PLAN:  1. Resolving abdominal pain and bloating.  Rule out black cohosh side      effects.  Rule out gastritis or duodenitis.  Complete a six-week      course of Zegerid and then discontinue the medication.  Avoid black      cohosh long-term.  2. Fatty infiltration of the liver.  Long-term weight loss program      monitored by her primary care physician has been recommended.  Plan      for repeat liver function tests in three months.  3. Right lower extremity edema and weight gain.  Follow-up office      visit with Dr. Laury Axon.     [name]. [name], MD, [facility]  Electronically Signed    MTS/MedQ  DD:  11/02/2006  DT: 11/03/2006  Job #: [id number]   cc:   Lelon Perla, DO

## 2010-05-21 NOTE — Assessment & Plan Note (Signed)
[facility]                         GASTROENTEROLOGY OFFICE NOTE   [name], [name]                     MRN:          [id number]  DATE:10/19/2006                            DOB:          May 04, 1955    CHIEF COMPLAINT:  A 55 year old African American female with left upper  quadrant pain.   HISTORY OF PRESENT ILLNESS:  [name] has a history of adenomatous  colon polyps, diverticulosis, internal hemorrhoids, GERD, and gastritis.  She had an intentional weight loss in 2006, down to 180 pounds.  She has  experienced a substantial weight gain beginning in February of this year  and her weight is now 235 pounds.  Over the past several weeks she has  had worsening problems with left upper quadrant pain, and a protruding  abdomen.  She feels uncomfortable lying on her left side.  She notes her  abdomen feels distended, and it is generally uncomfortable for her to  move in various positions.  She notes some bloating, and she has noticed  occasional small amounts of bright red blood with constipated bowel  movements.  These symptoms have not been present for several weeks.  She  also has had early satiety.  She relates she has been under substantial  stress due to job changes with her and her husband.  She denies aspirin  and NSAID usage.  She has no dysphagia, odynophagia, nausea, vomiting,  melena, or change in stool caliber.  No family history of colon cancer,  colon polyps, or inflammatory bowel disease.   PAST MEDICAL HISTORY:  1. GERD.  2. Gastritis.  3. Fatty infiltration of the liver.  4. Adenomatous colon polyps.  5. Diverticulosis.  6. Internal hemorrhoids.  7. Hypertension.  8. Depression.  9. Sleep apnea.  10.Status post hysterectomy.  11.Status post breast reduction surgery.  12.Status post cervical fusion.  13.Paroxysmal atrial fibrillation.  14.History of bradycardia.   CURRENT MEDICATIONS:  Listed on the chart, updated and  reviewed.   MEDICATION ALLERGIES:  NONE KNOWN.   SOCIAL HISTORY:  Per the handwritten form.   REVIEW OF SYSTEMS:  Per the handwritten form.   PHYSICAL EXAMINATION:  Obese African American female who appears  uncomfortable in the seated position, and needs to lean back to relieve  abdominal discomfort.  Height 5 feet 5 inches.  Weight 235 pounds.  Blood pressure is 140/84.  Pulse 76 and regular.  HEENT EXAM:  Anicteric sclerae.  Oropharynx clear.  CHEST:  Clear to auscultation bilaterally.  CARDIAC:  Regular rate and rhythm without murmurs appreciated.  ABDOMEN:  Protuberant and soft.  There is left upper quadrant and  epigastric tenderness to deep palpation.  No rebound or guarding.  No  palpable organomegaly, masses, or hernias.  Normoactive bowel sounds.  EXTREMITIES:  No clubbing, cyanosis, or edema.  NEUROLOGIC:  Alert and oriented x3.  Grossly nonfocal.   ASSESSMENT AND PLAN:  1. Left upper quadrant pain, early satiety, a protruding abdomen and a      substantial rapid weight gain.  Her symptoms may be related to her      weight gain.  Need to exclude occult neoplasms, ascites,      organomegaly, and other disorders.  Obtain a CBC, CMET, and lipase      today.  Schedule a CT scan of the abdomen and pelvis.  She is      advised to discontinue black cohosh.  Begin Zegerid 40 mg p.o.      q.a.m.  If the above evaluation is unremarkable, we will plan to      proceed with upper endoscopy.  2. Personal history of adenomatous colon polyps.  Recall colonoscopy      recommended for May 2009.     [name]. [name] Dar, MD, [facility]  Electronically Signed    MTS/MedQ  DD: 10/20/2006  DT: 10/20/2006  Job #: [id number]   cc:   Lelon Perla, DO

## 2010-05-21 NOTE — Cardiovascular Report (Signed)
[name], [name]              ACCOUNT NO.:  [id number]   MEDICAL RECORD NO.:  [id number]          PATIENT TYPE:  INP   LOCATION:  2022                         FACILITY:  [facility]   PHYSICIAN:  Bruce R. Juanda Chance, MD, FACCDATE OF BIRTH:  May 23, 1955   DATE OF PROCEDURE:  07/19/2008  DATE OF DISCHARGE:  07/19/2008                            CARDIAC CATHETERIZATION   CLINICAL HISTORY:  [name] is 55 years old and was admitted to the  hospital with chest pain.  She was triaged from the emergency department  to CT angiography, and this was technically difficult with a  questionable LAD lesion.  She was seen in consultation by Dr. Marca Ancona who recommended an evaluation with coronary angiography.  Her  risk factors include obesity and hypertension.  She also has a history  of paroxysmal atrial fibrillation.   PROCEDURE:  The procedure was performed via the right femoral artery  using an arterial sheath and 5-French preformed coronary catheters.  A  front wall arterial puncture was performed, and Omnipaque contrast was  used.  The patient tolerated the procedure well and left the laboratory  in satisfactory condition.   RESULTS:  Left main coronary artery:  The left main coronary artery is  free of significant disease.   Left anterior descending artery:  The left anterior descending artery  gave rise to diagonal branch and septal perforators which were free of  significant disease and the LA proper was free of significant disease.   The circumflex artery:  The circumflex artery is a moderate-sized vessel  that gave rise to marginal and posterolateral branches.  These and the  circumflex proper were free of significant disease.   The right coronary artery:  The right coronary artery is a dominant  vessel that gave rise to conus branch, right ventricular branch,  posterior descending branch, and posterolateral branch.  These vessels  were free of significant disease.   The left  ventriculogram:  The left ventriculogram was performed in the  RAO projection showed vigorous wall motion with no areas of hypokinesis.  Estimated ejection fraction was 70%.   CONCLUSION:  Normal coronary angiography and left ventricular wall  motion.   RECOMMENDATIONS:  Reassurance.  In view of these findings, I think her  chest pain is not cardiac.  We will give her a trial of PPIs and arrange  followup with Dr. Shirlee Latch and her primary care physician.      Bruce Elvera Lennox Juanda Chance, MD, [facility]  Electronically Signed     Everardo Beals. Juanda Chance, MD, [facility]  Electronically Signed    BRB/MEDQ  D:  07/19/2008  T:  07/20/2008  Job:  [id number]   cc:   Doylene Canning. Ladona Ridgel, MD  Marca Ancona, MD  Lelon Perla, DO

## 2010-05-21 NOTE — Assessment & Plan Note (Signed)
[facility]                         ELECTROPHYSIOLOGY OFFICE NOTE   DONESHIA, HILL                     MRN:          [id number]  DATE:10/01/2006                            DOB:          1955/04/11    Ms. Osgood returns today for followup.  She is a very pleasant, obese,  middle-aged woman with a history of paroxysmal AFib and hypertension who  I saw last back in July.  At that time she was maintaining a sinus  rhythm fairly well but did complain of fatigue and weakness a for this  reason, we switched her Toprol to atenolol hoping that the change in  metabolism might improve symptoms which it has in fact.  Her fatigue has  improved, though she continues to be discouraged because she continues  to gain weight.  She states that she is not eating any more than she  ever has, in fact maybe less.  She also tells me that she has been  diagnosed with some fatty liver changes by an ultrasound.  The patient  also notes that when she has a fatty meal that she gets left sided  epigastric discomfort.  This is not related to exertion.   PHYSICAL EXAMINATION:  GENERAL:  She is a pleasant, well-appearing,  middle-aged, obese woman in no acute distress.  VITAL SIGNS:  The blood pressure today was 152/86, the pulse was 68 and  regular.  The respirations were 16 and the weight was 238 pounds.  NECK:  Revealed no jugular venous distention.  There is no thyromegaly.  LUNGS:  Clear bilaterally to auscultation.  No wheezes, rales, or  rhonchi were present.  CARDIOVASCULAR:  Revealed a regular rate and rhythm with normal S1 and  S2.  ABDOMEN:  Obese, nontender, nondistended.  EXTREMITIES:  Demonstrated no peripheral edema.   EKG demonstrates a sinus rhythm with nonspecific T wave abnormality.   MEDICATIONS:  1. Multivitamin.  2. Aspirin.  3. Diovan/HCTZ 160/12.5.  4. Atenolol 50 mg daily.   IMPRESSION:  1. Paroxysmal atrial fibrillation.  2. Sinus  bradycardia.  3. Obesity.   DISCUSSION:  The patient is still concerned and anxious about her weight  gain.  In the last 6 months she has gained 15 pounds.  I have discussed  with her the importance of diet and exercise and we have spent about 15  minutes today talking about these subjects alone.  She has been asked to  consider trying appetite suppressants but unfortunately the stimulant  effect of these drugs would outweigh the benefit of her beta-blockers  and almost certainly make her have more AFib and for this reason I have  recommended against appetite suppressant medications like Phentermine.   I have recommended that she proceed with possible bariatric surgery  because I think this is the most likely way that she will loose weight.  Unfortunately, she has not been able to exercise on a regular basis.  She has also been unable to curb her appetite as need be.   I will plan to see the patient back in the office in 6 months, sooner  should she  have recurrence or worsening of her AFib.     Doylene Canning. Ladona Ridgel, MD  Electronically Signed    GWT/MedQ  DD: 10/01/2006  DT: 10/01/2006  Job #: [id number]   cc:   Lelon Perla, DO

## 2010-05-21 NOTE — Assessment & Plan Note (Signed)
HEALTHCARE                         ELECTROPHYSIOLOGY OFFICE NOTE   BANA, BORGMEYER                     MRN:          [id number]  DATE:08/03/2006                            DOB:          October 03, 1955    Ms. Mullan returns today for followup.  She is a very pleasant middle-  aged woman with a history of palpitations and atrial fibrillation, who I  saw most recently back in March.  At that time, we had her beta blocker  therapy, and she noted that she had shortness of breath when her heart  went into A fib.  She has been maintained on a combination of Diovan for  blood pressure and Toprol XL 50 a day and has had very few episodes of  tachy palpitations or heart failure symptoms; however, the patient has  had increasing weight gain and also increasing fatigue, which she  attributes to her beta blocker therapy.  She returns today for  additional evaluation.  She denies frank syncope.  She denies peripheral  edema.   MEDICATIONS:  1. Multivitamins.  2. Aspirin 81 a day.  3. Diovan 160/12.5 daily.  4. Toprol XL 50 daily.   PHYSICAL EXAMINATION:  She is a pleasant, well-appearing obese woman in  no acute distress.  The blood pressure today was 140/84.  The pulse was 68 and regular.  Respirations were 18.  The weight was 233 pounds, up 13 pounds from her  visit back in March.  NECK:  No jugular venous distention.  There was no thyromegaly.  Trachea  is midline.  Carotids are 2+ and symmetric.  LUNGS:  Clear bilaterally to auscultation.  There are no wheezes, rales  or rhonchi present.  There is no increased work of breathing.  CARDIOVASCULAR:  Regular rate and rhythm with a normal S1 and S2.  There  are no murmurs, rubs or gallops.  ABDOMEN:  Obese, nontender, nondistended.  There is no organomegaly.  EXTREMITIES:  No clubbing, cyanosis or edema.  The pulses are 2+ and  symmetric.   The EKG demonstrates sinus rhythm with normal axis and  intervals.   IMPRESSION:  1. Paroxysmal atrial fibrillation.  2. Sinus bradycardia.  3. Fatigue, on beta blockers.  4. Obesity.   DISCUSSION:  With regard to her A fib, it is well controlled on beta  blockers by her history.  Unfortunately, she has developed symptoms of  fatigue, which may well be related to her beta blocker.  Specifically,  the Toprol is metabolized through the liver.  I have recommended we  switch from Toprol to atenolol and hopefully, the renal excretion of the  drug may help improve her side effects.  I will plan to see the back in  several months.  For example, if she continues to feel fatigued on the  atenolol, then the next option would be to consider a calcium blocker,  +/- digoxin.  We will plan to see her back in several months.   ADDENDUM:  Patient is considering bariatric surgery for her obesity, and  I think in general, she would be a good candidate for this  and be a low  surgical risk.     Doylene Canning. Ladona Ridgel, MD  Electronically Signed    GWT/MedQ  DD: 08/03/2006  DT: 08/04/2006  Job #: [id number]   cc:   Lelon Perla, DO

## 2010-05-21 NOTE — H&P (Signed)
[name], [name]              ACCOUNT NO.:  [id number]   MEDICAL RECORD NO.:  [id number]          PATIENT TYPE:  INP   LOCATION:  2022                         FACILITY:  [facility]   PHYSICIAN:  Marca Ancona, MD      DATE OF BIRTH:  1955-03-17   DATE OF ADMISSION:  07/17/2008  DATE OF DISCHARGE:                              HISTORY & PHYSICAL   PRIMARY CARDIOLOGIST:  Doylene Canning. Ladona Ridgel, MD, but has not been seen over 2  years.   PRIMARY CARE PHYSICIAN:  Lelon Perla, DO   REASON FOR ADMISSION:  Chest pain.   HISTORY OF PRESENT ILLNESS:  A 55 year old African American female with  known history of paroxysmal atrial fibrillation in the past,  hypertension who was admitted with complaints of chest pain while  working on computer 2 days ago, she got up from chair and had squeezing  pressure midsternally with associated shortness of breath lasting 5  minutes.  The pain radiated down through her back.  The patient states  that it went away for about 10 minutes and then came back.  She took a  Nexium and Rolaids and walked around and just did not feel right.  She  began to feel very tired, nauseated, and then began to have an achiness  in the midsternal chest area, which was coming and going, especially  when lying on her left side, it became intolerable and she had to sit up  and was unable to sleep that evening secondary to the chest pain and  shortness of breath.  The patient finally fell back asleep several hours  later after sitting up for a long period of time and the pain did go  away.  The following day she was more fatigued while she was at work  sitting on her desk, she had numbness and tingling in her left arm and  began to have recurrent chest pressure at which time she came to the  emergency room with 8/10 chest pain.  She was given 4 baby aspirin,  placed on oxygen, and nitroglycerin x2, and the pain is gone away, but  she feels really tired since this time.  She ended up  having a cardiac  CT, which showed probable less than 50% stenosis in the proximal LAD,  but there was artifact.  Secondary to this abnormality, we were asked to  see the patient.   REVIEW OF SYSTEMS:  Positive for chest pain, shortness of breath pain,  radiating to the back with left arm numbness and tingling, and fatigue.  All other systems are reviewed and found to be negative.   PAST MEDICAL HISTORY:  1. Paroxysmal atrial fibrillation.  2. Sinus bradycardia.  3. Obesity.  4. Hypertension.  5. Past echocardiogram in February 2008 revealed a normal EF of 60%-      65%.   PAST SURGICAL HISTORY:  1. Hysterectomy.  2. Breast reduction.  3. Appendectomy.  4. Bunionectomy.  5. Cervical disk repair.   SOCIAL HISTORY:  She lives in [place] with her husband.  She is an  Airline pilot.  She has 2  children.  She does not smoke.  She does not  drink.  She does not use drugs.   FAMILY HISTORY:  Mother recently died 1 month ago from heart failure and  cardiac arrest.  Father deceased with prostate cancer.  She has 1 sister  with diabetes and 1 sister with coronary artery bypass grafting.   CURRENT MEDICATIONS:  1. Exforge.  2. Hydrochlorothiazide.  3. Fish oil daily.  4. Multivitamin daily.  5. Baby aspirin daily.  6. Calcium daily.   ALLERGIES:  No known drug allergies, but she is intolerant to CODEINE  causing severe nausea.   CURRENT LABS:  Hemoglobin 14.2, hematocrit 41.0, white blood cells 7.0,  platelets 314.  All other labs are pending with the exception of point  of care markers, which were negative x3.  EKG revealing sinus rhythm  with a rate of 71 beats per minute with no acute ischemic changes.  Chest x-ray reveals bronchitic changes.   PHYSICAL EXAMINATION:  VITAL SIGNS:  Blood pressure 147/71, pulse 73,  respirations 18, O2 sat 100% on 2 L.  GENERAL:  She is awake, alert, and oriented, little anxious.  HEENT:  Head is normocephalic and atraumatic.  Eyes, PERRLA.   Mucous  membranes, mouth are pink and moist.  Tongue is midline.  NECK:  Supple without JVD or carotid bruits appreciated.  CARDIOVASCULAR:  Regular rate and rhythm with soft S4 murmur  auscultated.  Pulses are 2+ and equal without bruits.  LUNGS:  Clear to auscultation without wheezes, rales, or rhonchi.  ABDOMEN:  Obese, nontender, 2+ bowel sounds.  EXTREMITIES:  Without clubbing, cyanosis, or edema.  NEURO:  Cranial nerves II through XII are grossly intact.   IMPRESSION:  1. Chest pain.  2. Hypertension.  3. Obesity.  4. History of paroxysmal atrial fibrillation.  No atrial fib in the      ER.   PLAN:  This is a 55 year old obese African American female admitted with  complaints of chest pain, shortness of breath, weakness (24 hours ago)  while at rest, negative cardiac enzymes x3.  Cardiac CT scan was  nonspecific for calcification.   The patient was seen and examined by myself and Dr. Marca Ancona.  The  patient with hypertension, obesity presented with atypical chest pain  and has been in the CDU for the last 24 hours.  She had some chest pain  today and the coronary CT angiogram was completed today and was found to  be a difficult study and was reviewed by Dr. Shirlee Latch, probably less than  50% stenosis proximal LAD, but there is artifact from calcification and  there is motion artifact.  Given the patient's ongoing episode of chest  pain, I thinks she needs more definitive evaluation, I offered  stress Myoview versus cardiac catheterization in the a.m. and she opted  for catheterization.  We will check fasting lipids in the a.m. for risk  stratification along with TSH and hemoglobin A1c secondary to family  history.  We will follow making further recommendations.      [name] Mare. Lyman Bishop, NP      Marca Ancona, MD  Electronically Signed    KML/MEDQ  D:  07/18/2008  T:  07/19/2008  Job:  [id number]   cc:   Lelon Perla, DO

## 2010-05-21 NOTE — Discharge Summary (Signed)
[name], [name]              ACCOUNT NO.:  [id number]   MEDICAL RECORD NO.:  [id number]          PATIENT TYPE:  INP   LOCATION:  2022                         FACILITY:  [facility]   PHYSICIAN:  Bruce R. Juanda Chance, MD, FACCDATE OF BIRTH:  [birth year]   DATE OF ADMISSION:  07/17/2008  DATE OF DISCHARGE:  07/19/2008                               DISCHARGE SUMMARY   PRIMARY CARDIOLOGIST:  Doylene Canning. Ladona Ridgel, MD.   PRIMARY CARE PHYSICIAN:  Lelon Perla, DO.   DISCHARGE DIAGNOSIS:  Noncardiac chest pain.   SECONDARY DIAGNOSES:  1. Paroxysmal atrial fibrillation (maintaining in normal sinus      rhythm).  2. Sinus bradycardia.  3. Obesity.  4. Hypertension (well controlled on meds).   PAST SURGICAL HISTORY:  1. Hysterectomy.  2. Breast reduction.  3. Appendectomy.  4. Bunionectomy  5. Cervical disk repair.   ALLERGIES/INTOLERANCES:  NKDA.   Intolerant to CODEINE (nausea).   PROCEDURES:  1. EKG completed on July 18, 2008, showed normal sinus rhythm with      early R-wave progression, no acute ST-T-wave changes, no      significant Q-waves, normal axis, no evidence of hypertrophy,      intervals within normal limits.  No significant change from prior      tracing completed on February 11, 2007.  2. CT angiography of the heart, coronary artery, structure, and      morphology.      a.     CT angiography of the heart showing age-advanced coronary       atherosclerosis.  Calcium score is 66.7, places the patient in       greater than 75th percentile for age-matched cohorts.  Proximal       LAD plaque, causes stenosis, possibly greater than 50% (given the       calcium-induced artifact).      b.     Moderate quality examination.      c.     Right-sided coronary artery dominant.      d.     No acute extracardiac findings.      e.     A 4 mm left lobe lung nodule.  If the patient is at high       risk for bronchogenic carcinoma, followup chest CT at 1 year is       recommended.  If  the patient is at low risk, no followup is       needed.  3. EKG completed on July 19, 2008, showing normal sinus rhythm at a      rate of 69, no significant changes from prior tracing.  4. Cardiac catheterization completed on July 19, 2008, showing normal      coronary arteries with normal LV function.   HISTORY OF PRESENT ILLNESS:  [name] is a 55 year old African  American female with a known history of PAF and hypertension who was  admitted with complaints of chest pain while working with computer 2  days ago, she got up from a chair and had wheezing pressure midsternally  with associated shortness of breath lasting  5 minutes.  The pain  radiated down through her back.  The patient states that it went away  after about 10 minutes and then returned.  She took Nexium and Rolaids  and walked around but did not feel right.  She began to feel very tired,  nauseated, and then began to have achiness in the mid sternal chest  area.  Symptoms waxed and waned especially while lying on her left side.  Symptoms became intolerable, she had to sit up and was unable to sleep  for several hours.  The patient also reports associated shortness of  breath.  The following day she was more fatigued at work and had  numbness and tingling in her left arm and began to have chest pressure  at which time she decided to present to the ED for further eval.  At  worst, pain 8/10 in severity.  In the ED she was given for aspirin,  placed on oxygen, and given two sublingual nitroglycerin and pain  resolved.  At the time she was seen by Cardiology, she had no symptoms  except for fatigue.  Cardiac CT (see results above).  Secondary to  results, Cardiology contacted.   HOSPITAL COURSE:  The patient admitted and underwent procedures as  described above.  She tolerated them well without any significant  complications.  BP slightly elevated but stable during hospital course  ranging from 130-166 systolic and  80-104 diastolic.  Heart rate within  normal limits, stable.  Most recent vital signs on day of discharge, BP  132/85, pulse 78, respiratory rate 18, O2 saturation 96% on room air,  temperature 97.4 degrees Fahrenheit.   Secondary to normal results of cardiac catheterization, the patient will  be given a prescription for Protonix 40 mg p.o. daily and discharged  home this evening once post-cath orders have been completed.  The  patient will be contacted by [facility] to follow up with Dr.  Lewayne Bunting for her PAF and will be instructed to follow up with her  primary care physician regarding her chest pain symptomatology.  At the  time of discharge, the patient will be given her new medication list,  prescription for Protonix, followup instructions, and post-cath  instructions.  All questions, concerns will be addressed at that time.   DISCHARGE LABORATORY DATA:  Hemoglobin A1c 5.7, lipase 27.  TSH within  normal limits at 1.602, total cholesterol 139, triglycerides 128, HDL  38, LDL 75, VLDL 26.  Three sets of point-of-care markers were negative  and two full sets of cardiac enzymes were negative.  Protime 14.9, INR  1.1.  On admission, WBC 7.0, HGB 14.2, HCT 41.0, PLT count 314.  Differential within normal limits.  Sodium 137, potassium 3.5, chloride  102, CO2 26, BUN 8, creatinine 0.89, glucose 120.  Liver function tests  within normal limits.   FOLLOWUP PLANS AND APPOINTMENTS:  Please see hospital course.   DISCHARGE MEDICATIONS:  1. Multivitamin p.o. daily.  2. Enteric-coated aspirin 81 mg p.o. daily.  3. Flaxseed oil p.o. daily.  4. Exforge as previously prescribed.   DURATION OF DISCHARGE ENCOUNTER:  35 minutes including physician time.      Jarrett Ables, PAC      Bruce R. Juanda Chance, MD, [facility]  Electronically Signed    MS/MEDQ  D:  07/19/2008  T:  07/20/2008  Job:  [id number]   cc:   Doylene Canning. Ladona Ridgel, MD  Lelon Perla, DO

## 2010-05-24 NOTE — Discharge Summary (Signed)
[name], [name]              ACCOUNT NO.:  [id number]   MEDICAL RECORD NO.:  [id number]          PATIENT TYPE:  INP   LOCATION:  1443                         FACILITY:  [facility]   PHYSICIAN:  Rosalyn Gess. Norins, MD  DATE OF BIRTH:  13-Jan-1955   DATE OF ADMISSION:  02/10/2006  DATE OF DISCHARGE:  02/12/2006                               DISCHARGE SUMMARY   ADMITTING DIAGNOSES:  1. Dyspnea with hypoxemia.  2. Abnormal EKG with tachycardia.   DISCHARGE DIAGNOSES:  1. Viral respiratory infection with asthmatic component.  2. Myocardial infarction and cardiac status stable.   PROCEDURES:  1. Chest x-ray at admission which showed no active disease and one      view.  2. CT angio which was negative for pulmonary embolus. No evidence of      pericardial effusion. No evidence of infiltrate.   CONSULTANTS:  None.   HISTORY OF PRESENT ILLNESS:  The patient is a 55 year old African-  American woman with history of hypertension and irregular heart rate  treated three weeks ago for pneumonia by her primary physician with  antibiotics which she could not name. She felt better, but then  developed a cough that never went away. Over the three days prior to  admission she had increasing symptoms with rhinorrhea, congestion,  nonproductive hacky cough, dyspnea on exertion. She reports all of her  coworkers are ill. The patient has had no fever. She does have chills,  no diaphoresis. She had no lower extremity swelling. She has had no  travel. The patient in the emergency department did have an EKG which  was thought to represent abnormal finding with ST-T wave abnormalities  with possible ischemia. Therefore she was admitted for further  evaluation.   HOSPITAL COURSE:  Pulmonary. Patient with a persistent dry hacky  nonproductive cough. She did develop significant wheezing. Studies as  noted were unremarkable.   LABORATORY WORK:  Negative influenza nasal swab, negative Legionella  antigen. The patient's oxygen saturations remained excellent. She did  have some wheezing and was treated with nebulizers with good relief of  her symptoms. With normal white count and with no significant findings  on x-ray with negative  Legionella findings, the patient is thought to  have a viral respiratory infection with an asthmatic component. At this  point she is stable and ready for discharge home. She had been initially  started on ceftriaxone but again given the absence of evidence of  bacterial infection, this will be discontinued and she will not be sent  home on antibiotics. The patient is to be discharged home.  She will  continue with albuterol metered-dose inhaler with Aerochamber as needed  2 puffs q.i.d. for wheezing. Short burst of prednisone with 30 mg  administered in hospital, then 20 mg daily x2, and 10 mg daily x3. The  patient will follow up with Dr. Loreen Freud. I have recommended an  appointment on Monday the 11th. The patient to call the office for  appointment.  1. Cardiovascular:  The patient's EKG and telemetry remained      unremarkable. Cardiac enzymes revealed CK  that went from 165 to 272      to 430 with unremarkable MB fractions at 0.9, 0.9, and 1.3.      Troponin remained negative at 0.01, 0.02, and 0.02. The patient did      have a 2-D echo while in hospital, and this was read out as a      normal study with no abnormalities, no effusions, and ejection      fraction of 60-65%. With the patient having ruled out for MI with      no significant abnormalities on 2-D echo, she is thought to be      stable and able to be discharged home.   Miscellaneous labs during her hospital stay included lipid panel with  cholesterol 140, LDL 73, HDL 49. TSH was normal at 0.43. Hemoglobin A1c  was normal at 5.7%.   DISCHARGE PHYSICAL EXAMINATION:  VITAL SIGNS:  Temperature was 99.2,  blood pressure 125/74, pulse 81, respirations 18, O2 sats 100% on room  air.   GENERAL APPEARANCE:  This is an overweight African-American woman lying  in bed in no acute distress.  CHEST:  Patient is moving air well. She does have end-expiratory  wheezing.  CARDIOVASCULAR:  2+ radial pulses, no JVD or carotid bruits.  She had a quiet precordium with a regular rate and rhythm.  ABDOMEN:  Obese.   FINAL LABORATORIES:  CBC from February 11, 2006 with a hemoglobin 12.4 g,  white count 4600 with platelet count 287,000. Final chemistries February 11, 2006 with sodium 140, potassium 3.5, chloride 105, CO2 27, BUN 10,  creatinine 0.8, glucose 114. Cardiac enzymes as noted. Calcium was  normal at 9.2.  Urinalysis was negative. Laboratory studies as noted  above.   DISCHARGE MEDICATIONS:  1. The patient will continue Diovan HCT as at home.  2. Prednisone 20 mg daily x2, 10 mg daily x3.  3. Albuterol metered-dose inhaler with Aerochamber 2 puffs q.i.d.      p.r.n..   The patient will need to be scheduled for pulmonary function studies pre  and post bronchodilators to complete her evaluation for possible asthma.  Study was not able to be performed due to equipment failure.   CONDITION AT DISCHARGE:  Stable and improved.      Rosalyn Gess Norins, MD  Electronically Signed     MEN/MEDQ  D:  02/12/2006  T:  02/12/2006  Job:  [id number]   cc:   Loreen Freud, M.D.  [place] [place]  [place] [address]

## 2010-05-24 NOTE — Discharge Summary (Signed)
[name], [name]              ACCOUNT NO.:  [id number]   MEDICAL RECORD NO.:  [id number]          PATIENT TYPE:  INP   LOCATION:  5712                         FACILITY:  [facility]   PHYSICIAN:  Jordan Hawks. Elnoria Howard, MD    DATE OF BIRTH:  12/30/1955   DATE OF ADMISSION:  04/30/2004  DATE OF DISCHARGE:  05/07/2004                                 DISCHARGE SUMMARY   HISTORY OF PRESENT ILLNESS:  This is a 55 year old black female with a past  medical history of abdominal pain located in the epigastric region.  The  patient states that her symptoms started shortly after her bilateral  salpingo-oophorectomy for multiple ovarian cysts.  Shortly after that time,  she developed worsening of her abdominal pain and subsequently was admitted  on April 09, 2004, by Dr. Cherly Hensen, her gynecologist, who performed the  initial surgery.  At that time, she was treated with an NG tube for several  days and reported improvement in her symptoms.  Abdominal films were  performed and revealed that she had dilated small bowel which was consistent  with ileus, however, partial small bowel obstruction could not be ruled out  at that time.  Because she responded to the NG tube, this was removed and  she subsequently was sent home.  At that time, the patient also complained  of having black, tarry stools, shortly after removal of the NG tube.  Because of persistent abdominal pain, the patient was referred to Dr. Elnoria Howard  for emergent GI consultation.  During the office examination by Dr. Elnoria Howard,  the patient was noted to have abdominal pain in the epigastric region and  rectal examination was equivocal for heme positivity.  Because of her  complaints of burning and epigastric pain as well as the possibility of  prior melena, an EGD was scheduled for the patient on May 01, 2004.  This  procedure was performed without any complications and she was noted to have  minimal erosions in the fundus of the stomach which was  biopsied.  There was  no evidence of any large ulcerations or other gross abnormalities.  CT scan  was pursued immediately after EGD and the patient was reported as having a  partial small bowel obstruction as there was evidence of dilated proximal  small bowel and no air in the colon.  However, there is no clear evidence of  a transition point.  At that time, the patient was felt to require hospital  admission for further evaluation and possible surgical intervention.   HOSPITAL COURSE:  The patient was subsequently on admitted on the same day  as endoscopy and the CT scan and orders were made for her to be n.p.o.  A  surgical consultation was obtained and Dr. Carolynne Edouard evaluated the patient and  felt that she would be at high risk for any surgical intervention at this  time since she was recently out of her prior abdominal surgery.  It was felt  that with conservative management, the patient would be able to resolve her  current situation.  After hospital day one, the patient denied  having any  further pain and physical examination confirmed this finding.  Throughout  the rest of the hospital stay, the patient continued to improve and her diet  was advanced from clear liquids to a full soft diet which she tolerated  without difficulty.  Serial KUBs were performed and essentially showed no  change, however, the contrast material from the CT scan as well as evidence  of air in the colonic lumen was seen as positive indicators of resolution of  her partial small bowel obstruction.  On the day of discharge, the patient  had no further complaints and was felt to be ready to return home, although  she was anxious in regards to what foods that she may be able to eat.  Clinically, it was felt that the patient was resolved from her partial small  bowel obstruction and could be safely discharged home.   DISPOSITION:  The patient is to be discharged home with her husband.  The  patient is scheduled for  follow up with Dr. Elnoria Howard in the office and she is to  update Dr. Elnoria Howard on Thursday, May 4, in regards to her condition.  If there  is any further worsening, she is instructed to report to the emergency room  for further evaluation and treatment.      PDH/MEDQ  D:  05/07/2004  T:  05/07/2004  Job:  [id number]   cc:   Maxie Better, M.D.  [address]  [place]  [place] [address]  Fax: [phone]

## 2010-05-24 NOTE — Consult Note (Signed)
NAMEJULEY, GIOVANETTI              ACCOUNT NO.:  [id number]   MEDICAL RECORD NO.:  [id number]          PATIENT TYPE:  INP   LOCATION:  5705                         FACILITY:  [facility]   PHYSICIAN:  Ollen Gross. Vernell Morgans, M.D. DATE OF BIRTH:  1955/07/19   DATE OF CONSULTATION:  04/30/2004  DATE OF DISCHARGE:                                   CONSULTATION   HISTORY OF PRESENT ILLNESS:  Ms. Fryberger is a 55 year old black female who  presents today about two weeks out from an open bilateral oophorectomy and  lysis of adhesions.  She has been having persistent nausea and vomiting and  bloating since surgery.  She feels like she has never really been right  since the time of surgery.  She has not run any fevers.  She is passing  flatus and having some liquidy black bowel movements.  She otherwise denies  any fevers, chills, chest pain, shortness of breath, or dysuria.   PAST MEDICAL HISTORY:  1. Significant for hypertension.  2. Uterine fibroids.     PAST SURGICAL HISTORY:  1. Significant for hysterectomy.  2. Bilateral oophorectomy.  3. Lysis of adhesions.     MEDICATIONS:  Diovan.   ALLERGIES:  No known drug allergies.   SOCIAL HISTORY:  She denies use of tobacco or tobacco products.   FAMILY HISTORY:  Noncontributory.   PHYSICAL EXAMINATION:  GENERAL:  She is a well-developed, well-nourished  black female in no acute distress.  SKIN:  Warm and dry without jaundice.  HEENT:  Eyes:  Extraocular movements intact.  Pupils equal, round and  reactive to light.  Sclerae nonicteric.  LUNGS:  Clear bilaterally with no use of accessory respiratory muscles.[name]  HEART:  Regular rate and rhythm with no pulse in the left chest.  ABDOMEN:  Soft with some mild diffuse tenderness, some mild to moderate  distention, no guarding or peritoneal signs, no palpable mass or  hepatosplenomegaly.  EXTREMITIES:  No cyanosis, clubbing, or edema.  Good strength in her arms  and legs.  PSYCHOLOGIC:  She is  alert and oriented x3 with no evidence today of anxiety  or depression.   LABORATORY DATA:  Her lab work is all pending.  Her CT scan was reviewed and  showed some dilated proximal small bowel, but no clear transitions  consistent with a partial small bowel obstruction.   ASSESSMENT/PLAN:  This is a 55 year old black female two weeks out from open  bilateral oophorectomy and lysis of adhesions with some persistent nausea  and x-rays that are consistent with partial bowel obstruction.  Certainly  her clinical course is also consistent with this given that she is passing  flatus and having some liquid bowel movements.  At this time she certainly  would be at a high risk for operative intervention for further bowel injury  because of adhesions and there is still a good chance this could resolve  with nonoperative management.  Will make her strictly n.p.o. and follow  closely, keep her hydrated, and continue to follow her exam and abdominal x-  rays.      PST/MEDQ  D:  04/30/2004  T:  04/30/2004  Job:  [id number]

## 2010-05-24 NOTE — H&P (Signed)
[name], [name]              ACCOUNT NO.:  [id number]   MEDICAL RECORD NO.:  [id number]          PATIENT TYPE:  INP   LOCATION:  NA                            FACILITY:  [facility]   PHYSICIAN:  Maxie Better, M.D.DATE OF BIRTH:  12/20/1955   DATE OF ADMISSION:  04/09/2004  DATE OF DISCHARGE:                                HISTORY & PHYSICAL   CHIEF COMPLAINT:  Persistent left lower quadrant pain and complex left  adnexal mass.   HISTORY OF PRESENT ILLNESS:  [age]-year-old gravida 2, para 2, married  black female, status post TAH in 1999 for uterine fibroids, who now presents  for exploratory laparotomy, left salpingo-oophorectomy, possible bilateral  salpingo-oophorectomy secondary to persistent left complex adnexal mass.  The patient has had intermittent left lower quadrant pain for several years.  She has undergone several ultrasounds during that time which revealed simple  ovarian cysts which ultimately had resolved on followup studies.  The  patient, however, presented again with left lower quadrant pain and on  January 22, 2004, the patient was found to have surgical absence of the  uterus, right ovary that appears normal.  The left adnexa has a 5.6 x 5 x  4.8-cm tubular cystic structure; ovarian tissue was not actually seen at  that time.  Followup ultrasound on March 05, 2004 reveals again an  adnexal mass on the left with what appears to be 2 cysts, 2.5- and 2.19-cm  adnexal mass with tissue suggestive of ovarian tissue present at that time.  Due to the patient's ongoing pain on that side, she now presents for  surgical evaluation and management.  The patient reports pain with  intercourse on that side.  She complains of decreased libido.  Her CA125 was  12.  The patient has had some hot flashes.   PAST MEDICAL HISTORY:   ALLERGIES:  No known drug allergies.   MEDICINES:  Diovan.   MEDICAL HISTORY:  1.  Chronic hypertension.  2.  Diverticulitis.   SURGICAL HISTORY:  1.  Appendectomy.  2.  Total abdominal hysterectomy.  3.  Breast reduction.  4.  Anterior cervical disk surgery.   FAMILY HISTORY:  Diabetes in her sister, her mother and sister with  hypertension.  No ovarian, colon or breast cancer.   OBSTETRICAL HISTORY:  Two vaginal deliveries.   SOCIAL HISTORY:  Married; 3 children, 1 adopted; Airline pilot at  [employer];  nonsmoker.   REVIEW OF SYSTEMS:  Positive for migraine headaches and all other systems,  except as noted in history of present illness, negative.   PHYSICAL EXAM:  GENERAL:  Well-developed, well-nourished, obese black female  in no acute distress.  VITAL SIGNS:  Blood pressure 120/82.  Weight is 214 pounds.  Temperature  97.8.  SKIN:  Skin shows no lesions.  HEENT:  Anicteric sclerae.  Pink conjunctivae.  Oropharynx negative.  HEART:  Regular rate and rhythm without murmur.  LUNGS:  Clear to auscultation.  NECK:  Supple.  Anterior scar noted.  Thyroid not palpable.  NODES:  No supraclavicular, inguinal or axillary nodes palpable.  BACK:  No CVA tenderness.  ABDOMEN:  Obese, soft.  Transverse scar and no palpable mass.  Tender in the  left lower quadrant to deep palpation, no rebound.  BREASTS:  Soft, nontender.  Bilateral scars secondary to reduction.  PELVIC:  Vulva shows no lesions.  Vagina has no discharge, cuff without any  palpable mass.  Surgical absence of cervix and uterus.  Adnexa:  Left  fullness; right nontender, no palpable mass.  RECTAL:  Deferred.  EXTREMITIES:  No edema or calf tenderness.   IMPRESSION:  Persistent left lower quadrant pain, left complex adnexal mass.   PLAN:  Admission, exploratory laparotomy, left salpingo-oophorectomy.  Risks  of procedure reviewed with the patient including, but not limited to,  infection, bleeding, injury to the bowels, ureter, bladder.  Possible need  to remove the right adnexa was discussed with the patient and antibiotic  prophylaxis, antiembolic  stockings, bowel prep, postop care and criteria for  discharge reviewed, continuation of the antihypertensive medications, all  questions answered.      [place]/MEDQ  D:  04/09/2004  T:  04/09/2004  Job:  [id number]

## 2010-05-24 NOTE — Discharge Summary (Signed)
NAME:  [name], [name]                        ACCOUNT NO.:  [id number]   MEDICAL RECORD NO.:  [id number]                   PATIENT TYPE:  INP   LOCATION:  0345                                 FACILITY:  [facility]   PHYSICIAN:  Rene Paci, M.D. [facility]          DATE OF BIRTH:  09-14-1955   DATE OF ADMISSION:  01/31/2003  DATE OF DISCHARGE:  02/01/2003                                 DISCHARGE SUMMARY   DISCHARGE DIAGNOSES:  1. Paroxysmal new onset rapid atrial fibrillation converted to normal sinus     rhythm with duration less than 20 minutes.  Cardiac workup negative per     outpatient Cardiolite.  2. Hypertension.  3. Gastroesophageal reflux disease.  4. Positive family history of diabetes.  5. Proteinuria.   DISCHARGE MEDICATIONS INCLUDE:  1. Diovan HCT 80/12.5 p.o. once daily.  2. ECASA 325 mg p.o. once daily.  3. Toprol XL 50 mg p.o. once daily.  4. Nexium 40 mg p.o. once daily p.r.n.   FOLLOW UP:  Hospital follow up is with her primary care physician, Dr. Lutricia Horsfall to be arranged in the next 2 weeks, also for outpatient stress  Cardiolite arranged Wednesday, February 08, 2003 at 9:30 a.m. at [facility]  Cardiology [facility].   CONDITION ON DISCHARGE:  Medically stable and asymptomatic.   BRIEF HOSPITAL COURSE BY PROBLEM:  Problem 1. RAPID ATRIAL FIBRILLATION.  Patient is a pleasant 56 year old with no previous history of medical issues  other than hypertension who had sudden onset of palpitations associated with  shortness of breath and chest tightness around 12:30 a.m. on the morning of  admission, she presented to the emergency room and was found to be in rapid  atrial fibrillation with her heart rate in the 150s-160s, she was given 15  mg IV diltiazem and began on a diltiazem drip, within 20 minutes of the  administration of this medication patient converted back to normal sinus.  She was admitted for observation on telemetry, cycling cardiac enzymes,  checking a 2-D echo  and TSH.  Her telemetry was normal for the remainder of  her hospitalization, TSH was normal, cardiac enzymes were negative and 2-D  echo showed normal LV function without wall motion abnormalities.  She was  seen in consultation by [facility] to ensure there was no indication  for coumadinization.  Only recommendations were addition of beta blocker to  her antihypertensive regimen and she was changed from a diltiazem drip over  to Toprol which she has tolerated well during this hospitalization.  No  indication for anticoagulation at this time.  She is advised to avoid  Sudafed as this may have been the trigger for her event.  She is recommended  to also continue daily aspirin for heart prevention, outpatient Cardiolite  as described but no formal cardiology followup is necessary at this time.   Problem 2. HYPERTENSION.  Patient admitted to noncompliance with her Diovan  secondary to making my stomach feel weird.  Her Diovan was continued by  cardiology in addition to the Toprol, blood pressure at time of discharge is  well controlled at 132/84, pulse of 69, proton pump inhibitor was added to  her regimen in order to minimize abdominal discomfort secondary to  medications to follow at primary care MD.   Problem 3. OBESITY WITH FAMILY HISTORY OF DIABETES.  Patient was advised on  weight loss which she agrees to follow up with her primary care physician.                                               Rene Paci, M.D. [facility]    VL/MEDQ  D:  02/01/2003  T:  02/01/2003  Job:  [id number]

## 2010-05-24 NOTE — Consult Note (Signed)
NAME:  [name], [name]                        ACCOUNT NO.:  [id number]   MEDICAL RECORD NO.:  [id number]                   PATIENT TYPE:  INP   LOCATION:  0345                                 FACILITY:  [facility]   PHYSICIAN:  Charlton Haws, M.D.                  DATE OF BIRTH:  Feb 23, 1955   DATE OF CONSULTATION:  01/31/2003  DATE OF DISCHARGE:                                   CONSULTATION   HISTORY OF PRESENT ILLNESS:  [name] is a very pleasant 55 year old  black female who was admitted to the [facility] emergency room  with palpitations and rapid heart rate. She was found to be in new onset  atrial fibrillation which lasted about 2 hours in duration. She has had  recent cold like symptoms and has been taking some Sudafed. She has had some  chest pain which is more sharp in nature and not typical of angina. She has  no previous  history of coronary artery disease.   She had had a recent  sinus infection that she was also taking Nasonex for.  The patient does not have a recent  thyroid study that  we know of.  She  does have a history of hypertension for which she takes Diovan. No known  drug allergies.   The patient's hospital course has been remarkable for early conversion. She  does have a history of migraines, and our P.A., Joellyn Rued, suggested  addition of a beta blocker to her Diovan. Since she has had this brief  episode of atrial fibrillation, I think this is a reasonable choice.   SOCIAL HISTORY:  The patient is happily married. Her husband is a long  distance Naval architect. She does some interior decorating.   PAST SURGICAL HISTORY:  1. Appendectomy.  2. Hysterectomy.  3. Cervical disk surgery.  4. Breast reduction.  5. Bunion surgery.  6. She does have some protein in her urine but she is not a diabetic.  7. History of GERD.   FAMILY HISTORY:  Positive for  diabetes on both sides of the family  as well  as hypertension.   PHYSICAL EXAMINATION:   GENERAL:  She is obese.  VITAL SIGNS:  The blood pressure is equal to 140/89, pulse is 81  and not  regular.  LUNGS:  Clear to auscultation.  HEART:  S1, S2 with distant heart sounds.  ABDOMEN:  Benign. No renal bruits.  EXTREMITIES:  Distal  pulses are intact with no edema.   LABORATORY DATA:  Her electrocardiogram shows currently sinus rhythm. Her  initial EKG showed atrial fibrillation and nonspecific  STT wave changes.  She then converted to sinus rhythm with premature atrial contractions. There  is borderline voltage for LVH in the limb leads.   IMPRESSION:  The patient will have  a 2D echocardiogram if there is no  marked LVH  and normal LV function and  her thyroid  studies  are normal. I  do not think she needs long-term anticoagulation. The addition of a beta  blocker to her Diovan would be good. She can then be discharged on an  aspirin a day along with her beta blocker and Diovan.   She should have an outpatient Cardiolite study in 6 to 8 weeks to rule out  occult coronary disease given her risk factors and atrial fibrillation. At  this point we can also assess her blood pressure response  on Toprol and  Diovan. I would do her stress test while she is taking her medications  including the beta blocker. Further recommendations will be made based on  the results of her thyroid studies.                                               Charlton Haws, M.D.    PN/MEDQ  D:  01/31/2003  T:  01/31/2003  Job:  [id number]

## 2010-05-24 NOTE — H&P (Signed)
[name] [name], [name]              ACCOUNT NO.:  [id number]   MEDICAL RECORD NO.:  [id number]          PATIENT TYPE:  INP   LOCATION:  5705                         FACILITY:  [facility]   PHYSICIAN:  Jordan Hawks. Elnoria Howard, MD    DATE OF BIRTH:  24-May-1955   DATE OF ADMISSION:  04/30/2004  DATE OF DISCHARGE:                                HISTORY & PHYSICAL   REASON FOR ADMISSION:  Partial small-bowel obstruction.   CONSULTATIONS:  Surgical consultation:  Ollen Gross. Carolynne Edouard, M.D.   HISTORY OF PRESENT ILLNESS:  This is a 55 year old black female with past  medical history of abdominal pain located in the epigastric region.  The  patient states that the symptoms started shortly after her BSO for multiple  ovarian cysts.  Shortly after that time, she did develop worsening of her  abdominal pain and subsequently was admitted on April 09, 2004, by Dr.  Cherly Hensen, her gynecologist, who performed initial surgery.  At that time she  was treated with an NG tube for several days and reported improvement in her  symptoms.  Abdominal films were performed and revealed that she had dilated  small-bowel which was consistent with ileus; however, a partial small-bowel  obstruction could not be ruled out at that time.  Because she responded to  the NG tube, this was removed, and subsequently she was sent home. At that  time the patient also complained of having black stools shortly after  removal of NG tube. Because of the persistent abdominal pain, the patient  was referred to [facility] for an emergent GI consultation.   During the office examination by Dr. Elnoria Howard, the patient was noted to have  abdominal pain in the epigastric region, and rectal exam was equivocal for  heme positivity.  Because of her complaints of burning and epigastric pain  as well as possibility of prior melena, an EGD was scheduled for the patient  on April 30, 2004.  This procedure was performed without any complications,  and she  was noted to have minimal erosions in the fundus of the stomach  which was biopsied.  There was no evidence of any large ulcerations or other  gross abnormality.  After the EGD, it was felt the patient would benefit  from repeat imaging done within the CT scan.  This was obtained, and the  patient was noted to have a partial small-bowel obstruction as there is  evidence of dilated proximal small-bowel and no air in the colon.  However,  there is no clear evidence of a transition point.  At this point, the  patient was felt to require admission to the hospital for further evaluation  and treatment.   ALLERGIES:  No known drug allergies.   MEDICATIONS:  Significant for oxycodone, Phazyme, and ibuprofen.   PAST MEDICAL HISTORY:  Unremarkable.   PAST SURGICAL HISTORY:  1.  Total abdominal hysterectomy.  2.  Breast reduction.  3.  Cervical fusion.  4.  Bunionectomy.  5.  Appendectomy.  6.  Two normal vaginal deliveries.   SOCIAL HISTORY:  The patient is married with two  children.  No alcohol,  drugs, or tobacco use.   FAMILY HISTORY:  Significant for prostate cancer, diabetes, and  gastroesophageal reflux disease.  No evidence of any colon cancers.   REVIEW OF SYSTEMS:  Negative for any headache, dizziness, blurred vision,  tinnitus, arthralgias, arthritis, myalgias.  GI complaints are as stated  above in the History of Present Illness.   PHYSICAL EXAMINATION:  VITAL SIGNS:  Not available at this time as she is  currently being admitted.  GENERAL:  The patient is in no acute distress, alert and oriented.  HEENT:  Normocephalic and atraumatic.  Extraocular muscles intact.  Pupils  equal, round, and reactive to light.  NECK:  Supple with no lymphadenopathy.  LUNGS: Clear to auscultation bilaterally.  CARDIOVASCULAR:  Regular rate and rhythm without murmurs, gallops, or rubs.  ABDOMEN:  Mildly distended, soft, tender to palpation in epigastric region  as well as the periumbilical  region.  Positive bowel sounds.  EXTREMITIES:  No clubbing, cyanosis, or edema.   LABORATORY VALUES:  On April 16, 2004, sodium 137, potassium 3.5, chloride  102, CO2 26, BUN 11, creatinine 0.8, glucose 98, albumin 2.7, AST 28, ALT  25, alkaline phosphatase 49, total bilirubin 0.8.  White blood cell count  9.6, hemoglobin 13.0, MCV 97.1, and platelets 495.   IMPRESSION:  1.  Partial small-bowel obstruction. After evaluation in patient with      radiographic evidence of this entity, it was felt she would benefit from      a surgical consultation and readmission back to the hospital for further      evaluation and treatment.  Plan is to obtain a surgical consultation.  2.  Intravenous hydration.  3.  N.P.O.  4.  Serial abdominal x-rays.   The patient's case was discussed with Dr. Carolynne Edouard at the time of admission.      PDH/MEDQ  D:  04/30/2004  T:  04/30/2004  Job:  [id number]   cc:   Ollen Gross. Vernell Morgans, M.D.  [address]  [place]  [place] [address]

## 2010-05-24 NOTE — Assessment & Plan Note (Signed)
[facility]                         ELECTROPHYSIOLOGY OFFICE NOTE   NDIDI, NESBY                     MRN:          [id number]  DATE:03/26/2006                            DOB:          19-Dec-1955    Ms. Radford is referred today by Dr. Laury Axon for evaluation of  palpitations and documented atrial fibrillation.  The patient is a very  pleasant, middle-aged woman with a history of palpitations dating back  to 2005.  Since then, she has had rare episodes of palpitations, but had  a particularly bad episode several weeks ago and was hospitalized and  after several hours her A-fib terminated spontaneously.  Since then, she  has been stable though she does have occasional episodes of  palpitations.  When she has A-fib, she feels shortness of breath and  some chest pressure but does not have frank syncope.  She denies  peripheral edema, and she had no other specific symptoms with regard to  her A-fib.  She has noted that she has been under increase in stress  lately and has had marked weight gain in the last year.   MEDICATIONS:  Diovan/HCTZ, aspirin, Nexium and a multivitamin.   FAMILY HISTORY:  Notable for a mother with hypertension and diabetes and  obesity.  Her father is deceased.  He has a history of prostate cancer.   PAST SURGICAL HISTORY:  Hysterectomy for fibroids three years ago.   REVIEW OF SYSTEMS:  As noted in the HPI.  Otherwise, all systems were  reviewed and found to be negative.   PHYSICAL EXAMINATION:  GENERAL:  She is a pleasant, well-appearing,  obese woman in no distress.  VITAL SIGNS:  The blood pressure was 142/81, the pulse was 79 and  regular, the respirations were 18 and the weight was 220 pounds.  HEENT:  Normocephalic and atraumatic.  Pupils were equal and round.  Oropharynx was moist.  The sclerae were anicteric.  NECK:  No jugulovenous distention, nor thyromegaly, the trachea was  midline and the carotids were 2+  and symmetric.  LUNGS:  Clear bilaterally to auscultation and no wheezes, rales or  rhonchi were present.  CARDIOVASCULAR:  Regular rate and rhythm with normal S1 and S2.  The PMI  was not enlarged nor laterally displaced.  ABDOMEN:  Obese, nontender and nondistended.  There was no organomegaly.  The bowel sounds were present.  There was no rebound or guarding.  EXTREMITIES:  No cyanosis, clubbing or edema.  The pulses were 2+ and  symmetric.   EKG demonstrates a sinus rhythm with nonspecific T-wave abnormality.   IMPRESSION:  1. Paroxysmal atrial fibrillation.  2. Obesity.  3. Borderline hypertension.   DISCUSSION:  We will plan on keeping Ms. Paddock on low dose aspirin  for now for thromboembolic prevention.  I have given her a prescription  for some Toprol today and we will count on starting her on beta  blockers.  She also may require antiarrhythmic drug therapy with  Flecainide.  I will see the patient back in several months.     Doylene Canning. Ladona Ridgel, MD  Electronically Signed  GWT/MedQ  DD: 03/26/2006  DT: 03/26/2006  Job #: [id number]   cc:   Lelon Perla, DO

## 2010-05-24 NOTE — Discharge Summary (Signed)
[name], [name]              ACCOUNT NO.:  [id number]   MEDICAL RECORD NO.:  [id number]          PATIENT TYPE:  INP   LOCATION:  9318                          FACILITY:  [facility]   PHYSICIAN:  Richardean Sale, M.D.   DATE OF BIRTH:  21-Jun-1955   DATE OF ADMISSION:  04/16/2004  DATE OF DISCHARGE:  04/18/2004                                 DISCHARGE SUMMARY   ADMISSION DIAGNOSES:  1.  Abdominal pain.  2.  Nausea and vomiting.  3.  Rule out partial small bowel obstruction, status post exploratory      laparotomy with bilateral salpingo-oophorectomy on April 09, 2004.   PROCEDURE:  Placement of nasogastric tube for abdominal decompression.   HISTORY OF PRESENT ILLNESS:  This is a 55 year old gravida 2, para 2, black  female who is status post exploratory laparotomy with bilateral salpingo-  oophorectomy on April 09, 2004, who presented on the evening of April 16, 2004, to maternity admission unit complaining of worsening abdominal pain.  The patient had been seen in the office 2 days prior with similar problems  which had improved slightly.  The patient had significant nausea, but she  had been able to tolerate liquids.  She did report flatus and a small bowel  movement on the day of admission.  She denied any fever, chills, or sweats.   HOSPITAL COURSE:  On admission, the patient was uncomfortable secondary to  abdominal distention.  Her abdomen was markedly distended, but with good  bowel sounds noted.  She underwent an abdominal ultrasound which was  negative for any evidence of cholecystitis.  She also underwent abdominal x-  rays on the day of admission which revealed progressive gaseous distention  of the small bowel with air/fluid levels identified.  Findings thought to be  secondary to a worsening ileus or a partial small bowel obstruction,  therefore, the patient was admitted for IV fluids and placement of an NG  tube for gastric decompression.   Laboratory studies on  admission showed normal complete metabolic panel,  amylase and lipase were normal.  Her white count was 9.6, hemoglobin 13,  platelet count 495.  The patient was symptomatically much improved after  placement of an NG tube.  Her diet was very slowly advanced.  Follow-up x-  ray performed on hospital day #2 and hospital day #3 showed progressive  improvement and findings were suggestive of an ileus and not so much a  partial small bowel obstruction.  On hospital day #3, the patient was  without complaints of nausea and vomiting, was tolerating a regular diet,  and her pain was adequately controlled with oral pain medications.  Throughout the hospitalization, the patient's chronic hypertension was well  controlled with intravenous medications until she was able to take her own  oral medication.  The patient was discharged to home on hospital day #3 in  good condition.   DISPOSITION:  To home.   CONDITION ON DISCHARGE:  Improved.   The patient is to return for any further vomiting, increasing abdominal  pain, fever, chills, sweats, or inability to pass flatus.  She  will follow  up in the office within the next week for a follow-up visit with her primary  physician, Dr. Cherly Hensen.   DISCHARGE MEDICATIONS:  The patient may continue her previous home blood  pressure medications and previously written prescription for Darvocet.  She  is also to use Mylanta and Simethicone as needed for discomfort.      JW/MEDQ  D:  05/11/2004  T:  05/12/2004  Job:  [id number]

## 2010-05-24 NOTE — Op Note (Signed)
[place]  Patient:    [name], [name] Visit Number: [id number] MRN: [id number]          Service Type: DSU Location: [facility] Attending Physician:  Eloise Levels Dictated by:   Mary A. Contogiannis, M.D. Proc. Date: 10/06/00 Admit Date:  10/06/2000   CC:         Mary A. Contogiannis, M.D.   Operative Report  PREOPERATIVE DIAGNOSIS:  Bilateral macromastia.  POSTOPERATIVE DIAGNOSIS:  Bilateral macromastia.  OPERATION:  Bilateral reduction mammoplasties.  SURGEON:  Mary A. Contogiannis, M.D.  ASSISTANT:  Alethia Berthold, C.F.A.  ANESTHESIOLOGISTS: 1. Cliffton Asters Ivin Booty, M.D. 2. Janetta Hora. Gelene Mink, M.D.  ANESTHESIA:  General endotracheal anesthesia.  ESTIMATED BLOOD LOSS:  200 cc.  FLUIDS REPLACED:  2400 cc crystalloid.  URINE OUTPUT:  500 cc.  COMPLICATIONS:  None.  SPECIMENS:  Amount of breast tissue removed was right breast 936 gm and left breast 1076 gm.  INDICATION:  The patient is a 55 year old African-American female who presents with bilateral macromastia, backaches, headaches, shoulder strap groove marks, and pain along with intertriginous skin rashes and request that we proceed with bilateral reduction mammoplasties today.  JUSTIFICATION:  Justification for overnight stay was progressive pain control along with ambulation and monitoring of breast flaps and nipples.  DESCRIPTION OF PROCEDURE:  The patient was marked in the preoperative holding area in the pattern wise for the future reduction mammoplasty.  She was then taken back to the operating room, placed on the wide table in supine position.  After adequate general endotracheal anesthesia was obtained, the patients chest was prepped with Betadine and alcohol and draped in a sterile fashion. The base of the breasts were then injected with 1% lidocaine with epinephrine. After adequate hemostasis was obtained, the procedure was begun.  The nipples were marked  with the forty 2 mm nipple marker.  This was then incised around the nipple down to the intermammary crease and the skin de-epithelialized in the inferior pedicle pattern.  Next, the medial, superior, and lateral skin flaps were elevated down to the chest wall and then slightly off the chest wall for better shaping of the breast.  The excess fat and glandular tissue were removed from the inferior pedicle. The nipple was examined and found to be pink and viable after this.  The wound was irrigated with saline irrigation.  Meticulous hemostasis was obtained with the Bovie electrocautery.  The inferior pedicle was centralized using 3-0 Prolene suture.  A #10 JP flap fully fluted drain was then placed into the wound.  The skin flaps were brought down in the inverted T junction with a 2-0 Prolene suture.  These sutures were stapled for temporary closure.  Attention was then turned to the left breast.  The nipple was marked with the 42 mm nipple marker and incised.  The skin was then de-epithelialized down to the inframammary crease in an inferior pedicle pattern.  The medial, superior and lateral skin flaps were elevated down to the chest wall and just slightly off the chest wall for better shaping of the breast.  The excess fat and glandular tissue were removed from the inferior pedicle.  The nipple was examined and found to be pink and viable after this was done.  The wound was then irrigated with saline irrigation.  Meticulous hemostasis was obtained with the Bovie electrocautery.  The inferior pedicle was centralized using 3-0 Prolene suture.  A #10 JP flap fully fluted drain was then placed into the  wound.  Skin flaps were brought together in an inverted T junction with a 2-0 Prolene suture.  The incisions were stapled for temporary closure.  The breast and neck are found to have good shape and symmetry.  The incisions were then closed on both breast using a 3-0 Monocryl  interrupted as well running dermal sutures and 4-0 Monocryl running intracuticular stitch on the skin.  The patient was then placed in the upright position.  The location of the future nipples was marked on the breast.  The 42 mm nipple marker was used to mark them.  She was then placed back in the supine position.  First the future nipple areolar complex on the right breast was incised.  This was then excised full-thickness with a Bovie electrocautery.  The nipple was inspected and found to be pink and viable.  It was then bought into the aperture and sewn in place using 4-0 Monocryl interrupted dermal sutures, followed by a 5-0 Monocryl running intracuticular stitch on the skin. In a likewise fashion, the area for the future nipple areolar complex was incised on the left breast.  This tissue was then excised full thickness with the Bovie.  The nipple was examined and found to be pink and viable.  It was then brought out into this aperture and sewn in place using 4-0 Monocryl interrupted dermal sutures as well a 5-0 Monocryl running intracuticular stitch on the skin.  The JP drains were then sewn in place using 3-0 nylon suture.  The incisions were dressed with benzoin and Steri-Strips, along with bacitracin and Adaptic on the nipples.  Then 4 by 4s were placed over the incision and she was placed in a light postoperative support bra.  There were no complications.  The patient tolerated the procedure well.  The final needle and sponge counts were reported to be correct at the end of the case.  The patient was then awakened from general endotracheal anesthesia and taken to the recovery room in stable condition.  She will stay overnight in the RRC for progressive pain control, along with monitoring of the breast flaps and nipples and ambulation.  Total amount of tissue removed of the right breast 936 gm and left breast 1076 gm. Dictated by:   Mary A. Contogiannis, M.D. Attending  Physician:  Eloise Levels DD:  10/06/00 TD:  10/06/00  Job: [id number]

## 2010-05-24 NOTE — Discharge Summary (Signed)
NAMELINEA, CALLES              ACCOUNT NO.:  [id number]   MEDICAL RECORD NO.:  [id number]          PATIENT TYPE:  INP   LOCATION:  9312                          FACILITY:  [facility]   PHYSICIAN:  Maxie Better, M.D.DATE OF BIRTH:  15-Jan-1955   DATE OF ADMISSION:  04/09/2004  DATE OF DISCHARGE:  04/11/2004                                 DISCHARGE SUMMARY   ADMISSION DIAGNOSIS:  Persistent left lower quadrant pain, complex left  ovarian cyst.   DISCHARGE DIAGNOSIS:  Persistent left lower quadrant pain, left  hydrosalpinx, bilateral hemorrhagic ovarian cysts, severe abdominopelvic  adhesions.   PROCEDURE:  Exploratory laparotomy, bilateral salpingo-oophorectomy,  extensive adhesiolysis, partial omentectomy.   HOSPITAL COURSE:  The patient was admitted to [facility].  She  underwent exploratory laparotomy, bilateral salpingo-oophorectomy, extensive  adhesiolysis, partial omentectomy.  Her surgery was notable for a difficult  intubation for which the patient was advised of the need for a medical alert  bracelet.  Her postoperative course was unremarkable.  Her pathology was  consistent with bilateral hemorrhagic cysts.  Omental tissue was otherwise  normal.  Left hydrosalpinx.  She was afebrile throughout her hospital stay.  She had passage of flatus by postoperative day #2.  She was tolerating a  regular no-added-salt diet.  Her incision showed no erythema, induration, or  exudate.  She was deemed well enough to be discharged home.   DISPOSITION:  Home.   CONDITION:  Stable.   DISCHARGE MEDICATIONS:  1.  Motrin 800 mg one p.o. q.6-8h. p.r.n. pain.  2.  Tylox #30 one to two tablets p.o. q.3-4h p.r.n. pain.  3.  Estradiol 0.1 mg patch to apply once a week.   FOLLOW-UP APPOINTMENT:  Staple removal next Tuesday in the office and for  four weeks postoperatively.   DISCHARGE INSTRUCTIONS:  Call for temperature greater than or equal to  100.4.  No intercourse for 4-6 weeks.   Call if increased incisional pain,  drainage, or redness from incision site.  No straining with a bowel  movement.  No heavy lifting or driving for two weeks.      [place]/MEDQ  D:  04/11/2004  T:  04/11/2004  Job:  [id number]

## 2010-05-24 NOTE — Op Note (Signed)
[name], [name]              ACCOUNT NO.:  [id number]   MEDICAL RECORD NO.:  [id number]          PATIENT TYPE:  INP   LOCATION:  9399                          FACILITY:  [facility]   PHYSICIAN:  Maxie Better, M.D.DATE OF BIRTH:  [birth year]   DATE OF PROCEDURE:  04/09/2004  DATE OF DISCHARGE:                                 OPERATIVE REPORT   PREOPERATIVE DIAGNOSES:  1.  Persistent left lower quadrant pain  2.  Complex left adnexal mass.   PROCEDURES:  1.  Exploratory laparotomy.  2.  Bilateral salpingo-oophorectomy.  3.  Extensive adhesiolysis.  4.  Peritoneal washings.  5.  Partial omentectomy.   POSTOPERATIVE DIAGNOSES:  1.  Persistent left lower quadrant pain.  2.  Bilateral adnexal cysts.  3.  Extensive omental and pelvic adhesions.   ANESTHESIA:  General.   SURGEON:  Maxie Better, M.D.   ASSISTANT:  Pershing Cox, M.D.   PROCEDURE:  After a difficult intubation, general anesthesia was induced.  The patient was in a supine position.  Examination under anesthesia revealed  some left adnexal fullness but otherwise was not well-delineated.  The  patient was then sterilely prepped and draped in the usual fashion.  A ring  clamp with a sponge was placed in the vagina to delineate the vaginal cuff.  The patient has had a previous removal of her cervix and uterus.  The pannus  was taped upwardly.  A transverse incision was already in place.  Marcaine  0.25% 10 mL was injected, ellipsing the previous Pfannenstiel skin incision.  A Pfannenstiel skin incision was made through the previous scar and carried  down to the rectus fascia.  The rectus fascia was incised in the midline and  extended bilaterally.  The rectus muscle was then bluntly and sharply  dissected off the rectus muscle in superior and inferior fashion.  The  rectus fascia was split in the midline.  The parietal peritoneum was  ultimately entered superiorly with notation of the omentum adherent to  the  anterior abdominal wall and the lower pelvis.  With careful dissection of  the lower pelvis of the omentum off the anterior abdominal wall, a large  window for peritoneal washings could be performed.  Normal saline 500 mL was  placed in the abdomen, 400 mL was removed for washings.  In order to further  facilitate visualization in the abdomen, the omentum which was adherent was  sharply dissected off the anterior abdominal wall.  It was also adherent to  the bowels, and this was carefully sharply dissected, and superiorly as well  it was dissected off the anterior abdominal wall.  The bowels were then  packed upwardly.  The self-retaining Balfour retractor was in place.  On  entering the pelvis, the left adnexa could not be seen at all.  The right  adnexa was noted to have a cystic mass.  The right infundibulopelvic  ligament was then identified, opened, and the ureter was seen medially  peristalsing away from the mass.  The adhesions around the mass were then  lysed, including omental adhesions.  The right ovarian vessels  were then  isolated, clamped, cut and free-tied with 0 Vicryl proximally and distally.  Carefully with dissection, the right tube and ovary were then removed.  Part  of it was also adhered to the bowels and the vaginal cuff, and this was  carefully dissected off of those areas and removed.  During the removal, the  cyst ruptured clear fluid.  Attention was then turned to the left adnexa,  where the bowel was overlying the left adnexa.  Bowel adhesions were then  lysed laterally.  The round ligament was identified on the left.  It was  isolated, clamped, suture ligated with 0 Vicryl and then cut to open the  left retroperitoneal space.  With careful dissection, it was then noted that  there was a cystic mass below the bowels, and the bowels were displaced  medially.  The mass was then carefully freed up any adhesions around it.  The left retroperitoneal space was then  opened and dissection was carried  down in order to try to locate the ureter.  This could not bee seen nor  palpated.  With continued medial dissection and lateral dissection, what  appears to be the left ovarian vessels were then identified.  These were  carefully then doubly clamped, cut and free tied.  The mass was then  continued to be sharply dissected.  It appeared to be ovary and tube.  The  mass was then bluntly dissected distally and subsequently ultimately removed  in its entirety.  At that point a decision was made to further identify the  ureter.  Careful dissection was then continued.  The ureter was then finally  seen peristalsing and without any evidence of injury.  Small bleeders were  then cauterized.  The abdomen was then irrigated, suctioned of debris.  The  omentum had multiple defects in it, and these resulted in partial  omentectomy being performed with clamping across these areas to decrease the  patient's risk of obstruction.  When good hemostasis was ultimately noted,  no adhesions remaining, the pelvis was then again irrigated, suctioned.  Reinspection of the pelvis showed good hemostasis.  The parietal peritoneum  was not closed.  The rectus fascia was inspected, small bleeders cauterized.  The rectus fascia was then closed with 0 Vicryl x2.  The subcutaneous area  which was deep was irrigated, suctioned, and interrupted 2-0 plain sutures  were then placed.  The skin was approximated using Ethicon staples.  Specimen was both tubes and ovaries bilaterally, peritoneal washings,  omental tissue.  The estimated blood loss was 200 mL.  Intraoperative fluid  was 2100 mL crystalloid.  Urine output was 200 mL clear yellow urine.  Sponge and instrument counts x2 were correct.  Complication was none.  The  patient tolerated the procedure well, was transferred to the recovery room  in stable condition.      [place]/MEDQ  D:  04/09/2004  T:  04/09/2004  Job:  [id number]

## 2010-05-24 NOTE — Assessment & Plan Note (Signed)
[facility]                                 ON-CALL NOTE   MEKISHA, BITTEL                     MRN:          [id number]  DATE:03/22/2006                            DOB:          April 18, 1955    TELEPHONE NUMBER:  [phone].   PRIMARY CARE PHYSICIAN:  Dr. Loreen Freud.   HISTORY:  Ms. Dotter is a 55 year old female patient who is being  referred to Dr. Ladona Ridgel by Dr. Laury Axon for consultation of palpitations.  Dr. Ladona Ridgel is due to see the patient in the next 2-3 weeks. The patient  does have a history of paroxysmal atrial fibrillation dating back to  January 2005. We actually saw her in the hospital at that time. Dr.  Eden Emms did the initial consult. She was maintained on beta blocker  therapy. She evidently, recently saw Dr. Laury Axon with palpitations and was  placed on a CardioNet monitor. She activated the monitor today with  complaints of palpitations. The monitoring technician called me and  noted that she was in rapid atrial fibrillation at a rate of 120-170.  When the tech called back to follow up, they did a follow up strip which  revealed normal sinus rhythm and a heart rate of 82-86. I actually  contacted the patient at home and she was doing well. She had developed  tachy palpitations last night that lasted until about 1am. She noted  that she felt well when I spoke to her. She denied chest pain or  shortness of breath. She denied any syncope or near syncope. She does  have a history of hypertension, but no history of diabetes mellitus.  There is no history of stroke or CHF, and again, she is only 55 years  old. She notes that she does take an aspirin a day.   PLAN:  I have asked the patient to report to the emergency room should  she have recurrent tachy palpitations or if she begins to feel poorly.  We will try to get the patient into the office sooner for earlier  consultation with Dr. Ladona Ridgel regarding the patient's paroxysmal atrial  fibrillation.   DISPOSITION:  As noted above.     Tereso Newcomer, PA-C  Electronically Signed    SW/MedQ  DD: 03/22/2006  DT: 03/23/2006  Job #: [id number]   cc:   Lelon Perla, DO

## 2010-07-12 ENCOUNTER — Ambulatory Visit (INDEPENDENT_AMBULATORY_CARE_PROVIDER_SITE_OTHER): Payer: 59 | Admitting: Family Medicine

## 2010-07-12 ENCOUNTER — Encounter: Payer: Self-pay | Admitting: Family Medicine

## 2010-07-12 DIAGNOSIS — G5600 Carpal tunnel syndrome, unspecified upper limb: Secondary | ICD-10-CM

## 2010-07-12 DIAGNOSIS — M545 Low back pain, unspecified: Secondary | ICD-10-CM

## 2010-07-12 DIAGNOSIS — M549 Dorsalgia, unspecified: Secondary | ICD-10-CM

## 2010-07-12 NOTE — Assessment & Plan Note (Signed)
Brace for both wrists Ice Hand surgeon if no better

## 2010-07-12 NOTE — Assessment & Plan Note (Signed)
Pt has hx of herniated disc She does not want to do anything right now.

## 2010-07-12 NOTE — Progress Notes (Signed)
  Subjective:    Patient ID: [name], female    DOB: [birth year], 55 y.o.   MRN: [id number]  HPI Pt here c/o numbness in hands and fee.  Pt types on computer all day.   It bothers her the most in am and after typing all day.  Pt also with hx of low back pain--- herniated disc and has numbness in feet with walking.     Review of Systems As above    Objective:   Physical Exam  Constitutional: She is oriented to person, place, and time. She appears well-developed and well-nourished.  Cardiovascular:  Pulses:      Radial pulses are 3+ on the right side, and 3+ on the left side.  Musculoskeletal: She exhibits no edema and no tenderness.       Arms: Neurological: She is alert and oriented to person, place, and time. She has normal reflexes.  Psychiatric: She has a normal mood and affect. Her behavior is normal.          Assessment & Plan:

## 2010-10-03 LAB — RAPID STREP SCREEN (MED CTR MEBANE ONLY): Streptococcus, Group A Screen (Direct): NEGATIVE

## 2010-10-03 LAB — MONONUCLEOSIS SCREEN: Mono Screen: NEGATIVE

## 2010-11-01 ENCOUNTER — Other Ambulatory Visit: Payer: Self-pay | Admitting: Family Medicine

## 2010-11-01 DIAGNOSIS — Z1231 Encounter for screening mammogram for malignant neoplasm of breast: Secondary | ICD-10-CM

## 2010-11-29 ENCOUNTER — Ambulatory Visit (HOSPITAL_COMMUNITY): Payer: 59

## 2010-12-03 ENCOUNTER — Encounter: Payer: Self-pay | Admitting: Family Medicine

## 2010-12-03 ENCOUNTER — Ambulatory Visit (INDEPENDENT_AMBULATORY_CARE_PROVIDER_SITE_OTHER): Payer: 59 | Admitting: Family Medicine

## 2010-12-03 VITALS — BP 120/70 | HR 78 | Temp 98.1°F | Wt 233.0 lb

## 2010-12-03 DIAGNOSIS — J40 Bronchitis, not specified as acute or chronic: Secondary | ICD-10-CM

## 2010-12-03 MED ORDER — GUAIFENESIN-CODEINE 100-10 MG/5ML PO SYRP: ORAL_SOLUTION | ORAL | Status: DC

## 2010-12-03 MED ORDER — PREDNISONE 10 MG PO TABS: ORAL_TABLET | ORAL | Status: DC

## 2010-12-03 MED ORDER — HYDROCHLOROTHIAZIDE 12.5 MG PO CAPS: 12.5000 mg | ORAL_CAPSULE | Freq: Every day | ORAL | Status: DC

## 2010-12-03 MED ORDER — AMLODIPINE BESYLATE-VALSARTAN 5-160 MG PO TABS: 1.0000 | ORAL_TABLET | Freq: Every day | ORAL | Status: DC

## 2010-12-03 MED ORDER — AMOXICILLIN-POT CLAVULANATE 875-125 MG PO TABS: 1.0000 | ORAL_TABLET | Freq: Two times a day (BID) | ORAL | Status: AC

## 2010-12-03 NOTE — Patient Instructions (Signed)

## 2010-12-03 NOTE — Progress Notes (Signed)
  Subjective:     [name] is a 55 y.o. female here for evaluation of a cough. Onset of symptoms was 6 days ago. Symptoms have been gradually worsening since that time. The cough is productive and is aggravated by exercise and reclining position. Associated symptoms include: chills, shortness of breath, sputum production and wheezing. Patient does have a history of asthma. Patient does have a history of environmental allergens. Patient has not traveled recently. Patient does not have a history of smoking. Patient has not had a previous chest x-ray. Patient has not had a PPD done.  The following portions of the patient's history were reviewed and updated as appropriate: allergies, current medications, past family history, past medical history, past social history, past surgical history and problem list.  Review of Systems Pertinent items are noted in HPI.    Objective:    Oxygen saturation 97% on room air BP 120/70  Pulse 78  Temp(Src) 98.1 F (36.7 C) (Oral)  Wt 233 lb (105.688 kg)  SpO2 97% General appearance: alert, cooperative, appears stated age and no distress Ears: normal TM's and external ear canals both ears Nose: Nares normal. Septum midline. Mucosa normal. No drainage or sinus tenderness. Throat: lips, mucosa, and tongue normal; teeth and gums normal Neck: mild anterior cervical adenopathy, supple, symmetrical, trachea midline and thyroid not enlarged, symmetric, no tenderness/mass/nodules Lungs: rhonchi bilaterally and wheezes bilaterally Heart: regular rate and rhythm, S1, S2 normal, no murmur, click, rub or gallop Extremities: extremities normal, atraumatic, no cyanosis or edema Lymph nodes: Cervical adenopathy: b/l    Assessment:    Acute Bronchitis    Plan:    Antibiotics per medication orders. Antitussives per medication orders. Avoid exposure to tobacco smoke and fumes. B-agonist inhaler. Call if shortness of breath worsens, blood in sputum, change in  character of cough, development of fever or chills, inability to maintain nutrition and hydration. Avoid exposure to tobacco smoke and fumes. Steroid inhaler as ordered. prednisone taper

## 2011-01-01 ENCOUNTER — Ambulatory Visit (HOSPITAL_COMMUNITY)
Admission: RE | Admit: 2011-01-01 | Discharge: 2011-01-01 | Disposition: A | Discharge status: Home / Self Care | Payer: 59 | Source: Ambulatory Visit | Attending: Family Medicine | Admitting: Family Medicine

## 2011-01-01 DIAGNOSIS — Z1231 Encounter for screening mammogram for malignant neoplasm of breast: Secondary | ICD-10-CM | POA: Insufficient documentation

## 2011-01-04 LAB — HM MAMMOGRAPHY: HM Mammogram: NEGATIVE

## 2011-03-31 ENCOUNTER — Telehealth: Payer: Self-pay | Admitting: Gastroenterology

## 2011-03-31 NOTE — Telephone Encounter (Signed)
Patient is having pain, gas and bloating, diarrhea.  Symptoms have been present for the last 2 weeks.  She has tried OTC products for diarrhea and they make her symptoms worse.  She denies fever or vomiting.  She is offered an appt for today to see [name] or keep her appt tomorrow with [name].  She has decided to keep the appt for tomorrow

## 2011-04-01 ENCOUNTER — Ambulatory Visit (INDEPENDENT_AMBULATORY_CARE_PROVIDER_SITE_OTHER): Payer: 59 | Admitting: Gastroenterology

## 2011-04-01 ENCOUNTER — Encounter: Payer: Self-pay | Admitting: Gastroenterology

## 2011-04-01 VITALS — BP 110/64 | HR 80 | Ht 65.0 in | Wt 220.6 lb

## 2011-04-01 DIAGNOSIS — R1012 Left upper quadrant pain: Secondary | ICD-10-CM

## 2011-04-01 DIAGNOSIS — R197 Diarrhea, unspecified: Secondary | ICD-10-CM

## 2011-04-01 MED ORDER — CIPROFLOXACIN HCL 500 MG PO TABS: 500.0000 mg | ORAL_TABLET | Freq: Two times a day (BID) | ORAL | Status: DC

## 2011-04-01 MED ORDER — METRONIDAZOLE 500 MG PO TABS: 500.0000 mg | ORAL_TABLET | Freq: Two times a day (BID) | ORAL | Status: DC

## 2011-04-01 MED ORDER — GLYCOPYRROLATE 1 MG PO TABS: ORAL_TABLET | ORAL | Status: DC

## 2011-04-01 NOTE — Patient Instructions (Addendum)
Your physician has requested that you go to the basement for the following lab work before leaving today: Stool culture, C. Diff by PCR, Giardia, Fecal fat, Ova and parasite. We have sent the following medications to your pharmacy for you to pick up at your convenience:Cipro, Flagyl, and Robinul.  Please follow up with Dr. [name] on 04/09/11 at 1:45pm.   cc: Loreen Freud, DO

## 2011-04-01 NOTE — Progress Notes (Signed)
History of Present Illness: This is a 56 year old female who had the sudden onset of urgent, watery, nonbloody diarrhea occurring 10-12 times each day. This is not associated with bloating and crampy left upper quadrant abdominal pain. Her symptoms have been present for about 2 weeks. She states she was started on a new hormone patch a week or so before her symptoms began. She denies any recent antibiotic usage. Last colonoscopy was in 2009 showing diverticulosis internal hemorrhoids. She has a history of adenomatous colon polyps. Denies weight loss, constipation, change in stool caliber, melena, hematochezia, nausea, vomiting, dysphagia, reflux symptoms, chest pain.  Current Medications, Allergies, Past Medical History, Past Surgical History, Family History and Social History were reviewed in [employer] record.  Physical Exam: General: Well developed , well nourished, no acute distress Head: Normocephalic and atraumatic Eyes:  sclerae anicteric, EOMI Ears: Normal auditory acuity Mouth: No deformity or lesions Lungs: Clear throughout to auscultation Heart: Regular rate and rhythm; no murmurs, rubs or bruits Abdomen: Soft, mild left upper quadrant tenderness to deep palpation without rebound or guarding, and non distended. No masses, hepatosplenomegaly or hernias noted. Normal Bowel sounds Musculoskeletal: Symmetrical with no gross deformities  Pulses:  Normal pulses noted Extremities: No clubbing, cyanosis, edema or deformities noted Neurological: Alert oriented x 4, grossly nonfocal Psychological:  Alert and cooperative. Normal mood and affect  Assessment and Recommendations:  1. Diarrhea and left upper quadrant pain. Presumed infectious diarrhea. Obtain standard stool cultures. Begin Cipro and Flagyl empirically after stool cultures collected. Consider colonoscopy if diagnosis is not established and symptoms do not respond to antibiotics  2. Fatty liver. Continue  long-term weight loss program with a low-fat diet.  3. History of adenomatous colon polyps. Surveillance colonoscopy due May 2014.

## 2011-04-03 ENCOUNTER — Other Ambulatory Visit: Payer: 59

## 2011-04-03 ENCOUNTER — Other Ambulatory Visit: Payer: Self-pay | Admitting: Gastroenterology

## 2011-04-03 DIAGNOSIS — R197 Diarrhea, unspecified: Secondary | ICD-10-CM

## 2011-04-04 LAB — FECAL FAT QUALITATIVE
Free Fatty Acids: NORMAL
NEUTRAL FAT: NORMAL

## 2011-04-04 LAB — OVA AND PARASITE SCREEN: OP: NONE SEEN

## 2011-04-04 LAB — GIARDIA ANTIGEN: Giardia Screen (EIA): NEGATIVE

## 2011-04-07 LAB — STOOL CULTURE

## 2011-04-08 LAB — CLOSTRIDIUM DIFFICILE BY PCR: Toxigenic C. Difficile by PCR: NOT DETECTED

## 2011-04-09 ENCOUNTER — Ambulatory Visit: Payer: 59 | Admitting: Gastroenterology

## 2011-04-09 ENCOUNTER — Encounter: Payer: Self-pay | Admitting: Gastroenterology

## 2011-04-09 ENCOUNTER — Ambulatory Visit (INDEPENDENT_AMBULATORY_CARE_PROVIDER_SITE_OTHER): Payer: 59 | Admitting: Gastroenterology

## 2011-04-09 VITALS — BP 128/60 | HR 80 | Ht 65.0 in | Wt 222.0 lb

## 2011-04-09 DIAGNOSIS — R1012 Left upper quadrant pain: Secondary | ICD-10-CM

## 2011-04-09 DIAGNOSIS — R197 Diarrhea, unspecified: Secondary | ICD-10-CM

## 2011-04-09 MED ORDER — ALIGN 4 MG PO CAPS: 1.0000 | ORAL_CAPSULE | Freq: Every day | ORAL | Status: DC

## 2011-04-09 NOTE — Progress Notes (Signed)
History of Present Illness: This is a 56 year old female returning for followup. She has had substantial improvement in her diarrhea and abdominal pain after taking a course of Cipro and Flagyl she still has gas and intermittent crampy left-sided abdominal pain. Stool studies were all negative. Robinul appears to be effective in improving these symptoms.   Current Medications, Allergies, Past Medical History, Past Surgical History, Family History and Social History were reviewed in [employer] record.  Physical Exam: General: Well developed , well nourished, no acute distress Head: Normocephalic and atraumatic Eyes:  sclerae anicteric, EOMI Ears: Normal auditory acuity Mouth: No deformity or lesions Lungs: Clear throughout to auscultation Heart: Regular rate and rhythm; no murmurs, rubs or bruits Abdomen: Soft, mild left-sided abdominal tenderness without rebound or guarding, and non distended. No masses, hepatosplenomegaly or hernias noted. Normal Bowel sounds Musculoskeletal: Symmetrical with no gross deformities  Pulses:  Normal pulses noted Extremities: No clubbing, cyanosis, edema or deformities noted Neurological: Alert oriented x 4, grossly nonfocal Psychological:  Alert and cooperative. Normal mood and affect  Assessment and Recommendations:  1. Resolving self-limited diarrhea, presumed infectious. Advance diet slowly and still avoid high fat foods, caffeine and alcohol. Begin Align daily for 2-3 weeks Continue Robinul twice a day as needed. If her symptoms do not resolve over the next 2-3 weeks she is to call for further followup.

## 2011-04-09 NOTE — Patient Instructions (Addendum)
We have given you samples of Align. This puts good bacteria back into your intestines. You should take 1 capsule by mouth once daily. If this works well for you, it can be purchased over the counter. Call back if your pain does not fully resolve in 2-3 weeks.

## 2011-05-02 ENCOUNTER — Telehealth: Payer: Self-pay | Admitting: Family Medicine

## 2011-05-02 NOTE — Telephone Encounter (Signed)
Pt called requesting samples of exforge 5-160mg . She states she has run out and uses mail order pharmacy. Please call @ [phone] or [phone].

## 2011-05-02 NOTE — Telephone Encounter (Signed)
Pt came into office and picked up 6 weeks worth of samples.

## 2011-05-02 NOTE — Telephone Encounter (Signed)
Please advise 

## 2011-05-02 NOTE — Telephone Encounter (Signed)
Ok for samples if available 

## 2011-05-05 LAB — HM PAP SMEAR: HM Pap smear: NEGATIVE

## 2011-07-02 ENCOUNTER — Encounter: Payer: Self-pay | Admitting: Family Medicine

## 2011-07-02 ENCOUNTER — Ambulatory Visit (INDEPENDENT_AMBULATORY_CARE_PROVIDER_SITE_OTHER): Payer: 59 | Admitting: Family Medicine

## 2011-07-02 VITALS — BP 110/70 | HR 76 | Temp 98.6°F | Wt 228.2 lb

## 2011-07-02 DIAGNOSIS — M25519 Pain in unspecified shoulder: Secondary | ICD-10-CM

## 2011-07-02 MED ORDER — PREDNISONE 10 MG PO TABS: ORAL_TABLET | ORAL | Status: DC

## 2011-07-02 MED ORDER — HYDROCODONE-ACETAMINOPHEN 5-500 MG PO TABS: 1.0000 | ORAL_TABLET | Freq: Three times a day (TID) | ORAL | Status: AC | PRN

## 2011-07-02 NOTE — Progress Notes (Signed)
  Subjective:    [name] is a 56 y.o. female who presents with bilateral shoulder pain. The symptoms began a week ago. Aggravating factors: started last Saturday but got worse after going to zumba. Pain is located diffusely throughout the shoulder. Discomfort is described as aching and throbbing. Symptoms are exacerbated by repetitive movements, overhead movements and lying on the shoulder. Evaluation to date: none. Therapy to date includes: ice, OTC analgesics which are not very effective and heat.  The following portions of the patient's history were reviewed and updated as appropriate: allergies, current medications, past family history, past medical history, past social history, past surgical history and problem list.  Review of Systems Pertinent items are noted in HPI.   Objective:    BP 110/70  Pulse 76  Temp 98.6 F (37 C) (Oral)  Wt 228 lb 3.2 oz (103.511 kg)  SpO2 97% Right shoulder: positive for tenderness about the glenohumeral joint  Left shoulder: non-specific diffuse tenderness about the shoulder, tenderness over the glenohumeral joint, crepitus with ROM and diminished motor function in ---pain with abduction b/l shoulders     Assessment:    Bilateral shoulder pain    Plan:    Natural history and expected course discussed. Questions answered. Agricultural engineer distributed. Reduction in offending activity. Gentle ROM exercises. Rest, ice, compression, and elevation (RICE) therapy. ortho if no better

## 2011-07-02 NOTE — Patient Instructions (Signed)
Shoulder Pain The shoulder is a ball and socket joint. The muscles and tendons (rotator cuff) are what keep the shoulder in its joint and stable. This collection of muscles and tendons holds in the head (ball) of the humerus (upper arm bone) in the fossa (cup) of the scapula (shoulder blade). Today no reason was found for your shoulder pain. Often pain in the shoulder may be treated conservatively with temporary immobilization. For example, holding the shoulder in one place using a sling for rest. Physical therapy may be needed if problems continue. HOME CARE INSTRUCTIONS   Apply ice to the sore area for 15 to 20 minutes, 3 to 4 times per day for the first 2 days. Put the ice in a plastic bag. Place a towel between the bag of ice and your skin.   If you have or were given a shoulder sling and straps, do not remove for as long as directed by your caregiver or until you see a caregiver for a follow-up examination. If you need to remove it to shower or bathe, move your arm as little as possible.   Sleep on several pillows at night to lessen swelling and pain.   Only take over-the-counter or prescription medicines for pain, discomfort, or fever as directed by your caregiver.   Keep any follow-up appointments in order to avoid any type of permanent shoulder disability or chronic pain problems.  SEEK MEDICAL CARE IF:   Pain in your shoulder increases or new pain develops in your arm, hand, or fingers.   Your hand or fingers are colder than your other hand.   You do not obtain pain relief with the medications or your pain becomes worse.  SEEK IMMEDIATE MEDICAL CARE IF:   Your arm, hand, or fingers are numb or tingling.   Your arm, hand, or fingers are swollen, painful, or turn white or blue.   You develop chest pain or shortness of breath.  MAKE SURE YOU:   Understand these instructions.   Will watch your condition.   Will get help right away if you are not doing well or get worse.    Document Released: 10/02/2004 Document Revised: 12/12/2010 Document Reviewed: 12/07/2010 [employer] Patient Information 2012 [employer]. 

## 2011-08-04 ENCOUNTER — Telehealth: Payer: Self-pay | Admitting: Family Medicine

## 2011-08-04 NOTE — Telephone Encounter (Signed)
Called pt and notified pt that both doctors agreed to change of pcp. Pt has been schd 30 min ov to est with Dr Caryl Never on Wed 08/06/11 at 11:15am.

## 2011-08-04 NOTE — Telephone Encounter (Signed)
Ok to switch 

## 2011-08-04 NOTE — Telephone Encounter (Signed)
OK with me.

## 2011-08-04 NOTE — Telephone Encounter (Signed)
Pt is currently a pt of Dr Laury Axon at [facility], but pt is req to switch doctors to Dr Caryl Never at [facility], because office is closer to pts home and work. Pls advise if ok.

## 2011-08-06 ENCOUNTER — Ambulatory Visit (INDEPENDENT_AMBULATORY_CARE_PROVIDER_SITE_OTHER): Payer: 59 | Admitting: Family Medicine

## 2011-08-06 ENCOUNTER — Encounter: Payer: Self-pay | Admitting: Family Medicine

## 2011-08-06 VITALS — BP 110/60 | HR 72 | Temp 98.5°F | Resp 12 | Ht 64.75 in | Wt 226.0 lb

## 2011-08-06 DIAGNOSIS — R609 Edema, unspecified: Secondary | ICD-10-CM

## 2011-08-06 DIAGNOSIS — R600 Localized edema: Secondary | ICD-10-CM

## 2011-08-06 DIAGNOSIS — I10 Essential (primary) hypertension: Secondary | ICD-10-CM

## 2011-08-06 NOTE — Progress Notes (Signed)
  Subjective:    Patient ID: [name], female    DOB: March 13, 1955, 56 y.o.   MRN: [id number]  HPI  Patient is seen new to this clinic-but not new to [place].  Has been at [facility] office and requested change here. She has history of hypertension, reported transient atrial fibrillation, obesity, and GERD. Current medications reviewed. She takes HCTZ and amlodipine valsartan combination tablet. Blood pressures been well controlled. No dizziness. No cardiac history.  Seen today with asymmetric edema right leg and foot for past 2 weeks. No significant leg pain. No dyspnea. No pleuritic pain. No cough. No history of DVT. Denies lower extremity injury. She does use some type of topical estrogen per gynecologist. Nonsmoker. No recent travels. Sister apparently just diagnosed with pulmonary embolus post surgery in hospital. Otherwise no family history of coagulopathy  Past Medical History  Diagnosis Date  . Anemia   . Atrial fibrillation   . Headache   . Hypertension   . GERD (gastroesophageal reflux disease)   . Gastritis   . Fatty liver   . Diverticulosis   . Internal hemorrhoids   . Depression   . OSA (obstructive sleep apnea)   . Asthma   . Adenomatous colon polyp 05/2002  . Hemorrhoids    Past Surgical History  Procedure Date  . Partial hysterectomy   . Appendectomy   . Breast reduction surgery   . Total abdominal hysterectomy w/ bilateral salpingoophorectomy   . Cervical fusion     reports that she has never smoked. She has never used smokeless tobacco. She reports that she does not drink alcohol or use illicit drugs. family history includes Coronary artery disease in her sister; Diabetes in her mother; and Hypertension in her mother and sister.  There is no history of Colon cancer. No Known Allergies    Review of Systems  Constitutional: Negative for fatigue and unexpected weight change.  Eyes: Negative for visual disturbance.  Respiratory: Negative for  cough, chest tightness, shortness of breath and wheezing.   Cardiovascular: Positive for leg swelling. Negative for chest pain and palpitations.  Genitourinary: Negative for dysuria.  Musculoskeletal: Negative for joint swelling.  Neurological: Negative for dizziness, seizures, syncope, weakness, light-headedness and headaches.       Objective:   Physical Exam  Constitutional: She appears well-developed and well-nourished.  Neck: Neck supple. No thyromegaly present.  Cardiovascular: Normal rate and regular rhythm.   Pulmonary/Chest: Effort normal and breath sounds normal. No respiratory distress. She has no wheezes. She has no rales.  Musculoskeletal: She exhibits edema.       1+ pitting edema right foot ankle and lower leg. No calf tenderness. No color changes. Good distal foot pulses.          Assessment & Plan:  #1 asymmetric edema right lower extremity greater than left. Venous Doppler to rule out DVT. #2 hypertension. Well controlled. Continue current medications  #3 health maintenance. Patient needs complete physical. She will schedule

## 2011-08-06 NOTE — Patient Instructions (Signed)
Elevate leg frequently Follow up immediately for any shortness of breath or chest pain. Schedule complete physical exam.

## 2011-08-07 ENCOUNTER — Encounter (INDEPENDENT_AMBULATORY_CARE_PROVIDER_SITE_OTHER): Payer: 59

## 2011-08-07 DIAGNOSIS — R600 Localized edema: Secondary | ICD-10-CM

## 2011-08-07 DIAGNOSIS — M7989 Other specified soft tissue disorders: Secondary | ICD-10-CM

## 2011-08-08 ENCOUNTER — Emergency Department (HOSPITAL_COMMUNITY): Payer: 59

## 2011-08-08 ENCOUNTER — Encounter (HOSPITAL_COMMUNITY): Payer: Self-pay | Admitting: Emergency Medicine

## 2011-08-08 ENCOUNTER — Telehealth: Payer: Self-pay | Admitting: Family Medicine

## 2011-08-08 ENCOUNTER — Emergency Department (HOSPITAL_COMMUNITY)
Admission: EM | Admit: 2011-08-08 | Discharge: 2011-08-08 | Disposition: A | Discharge status: Home / Self Care | Payer: 59 | Attending: Emergency Medicine | Admitting: Emergency Medicine

## 2011-08-08 DIAGNOSIS — R0602 Shortness of breath: Secondary | ICD-10-CM | POA: Insufficient documentation

## 2011-08-08 DIAGNOSIS — R002 Palpitations: Secondary | ICD-10-CM | POA: Insufficient documentation

## 2011-08-08 DIAGNOSIS — I10 Essential (primary) hypertension: Secondary | ICD-10-CM | POA: Insufficient documentation

## 2011-08-08 DIAGNOSIS — J45909 Unspecified asthma, uncomplicated: Secondary | ICD-10-CM | POA: Insufficient documentation

## 2011-08-08 DIAGNOSIS — Z9089 Acquired absence of other organs: Secondary | ICD-10-CM | POA: Insufficient documentation

## 2011-08-08 DIAGNOSIS — Z79899 Other long term (current) drug therapy: Secondary | ICD-10-CM | POA: Insufficient documentation

## 2011-08-08 DIAGNOSIS — K219 Gastro-esophageal reflux disease without esophagitis: Secondary | ICD-10-CM | POA: Insufficient documentation

## 2011-08-08 DIAGNOSIS — I4891 Unspecified atrial fibrillation: Secondary | ICD-10-CM | POA: Insufficient documentation

## 2011-08-08 LAB — CBC WITH DIFFERENTIAL/PLATELET
Basophils Absolute: 0 10*3/uL (ref 0.0–0.1)
Basophils Relative: 0 % (ref 0–1)
Eosinophils Absolute: 0.2 10*3/uL (ref 0.0–0.7)
Eosinophils Relative: 3 % (ref 0–5)
HCT: 43.8 % (ref 36.0–46.0)
Hemoglobin: 15.4 g/dL — ABNORMAL HIGH (ref 12.0–15.0)
Lymphocytes Relative: 47 % — ABNORMAL HIGH (ref 12–46)
Lymphs Abs: 2.9 10*3/uL (ref 0.7–4.0)
MCH: 33 pg (ref 26.0–34.0)
MCHC: 35.2 g/dL (ref 30.0–36.0)
MCV: 93.8 fL (ref 78.0–100.0)
Monocytes Absolute: 0.5 10*3/uL (ref 0.1–1.0)
Monocytes Relative: 8 % (ref 3–12)
Neutro Abs: 2.6 10*3/uL (ref 1.7–7.7)
Neutrophils Relative %: 42 % — ABNORMAL LOW (ref 43–77)
Platelets: 359 10*3/uL (ref 150–400)
RBC: 4.67 MIL/uL (ref 3.87–5.11)
RDW: 12.7 % (ref 11.5–15.5)
WBC: 6.1 10*3/uL (ref 4.0–10.5)

## 2011-08-08 LAB — BASIC METABOLIC PANEL
BUN: 11 mg/dL (ref 6–23)
CO2: 25 mEq/L (ref 19–32)
Calcium: 9.9 mg/dL (ref 8.4–10.5)
Chloride: 106 mEq/L (ref 96–112)
Creatinine, Ser: 0.74 mg/dL (ref 0.50–1.10)
GFR calc Af Amer: >90 mL/min (ref >90)
GFR calc non Af Amer: >90 mL/min (ref >90)
Glucose, Bld: 108 mg/dL — ABNORMAL HIGH (ref 70–99)
Potassium: 3.6 mEq/L (ref 3.5–5.1)
Sodium: 143 mEq/L (ref 135–145)

## 2011-08-08 LAB — TROPONIN I: Troponin I: <0.3 ng/mL (ref <0.30)

## 2011-08-08 LAB — D-DIMER, QUANTITATIVE: D-Dimer, Quant: 0.29 ug/mL-FEU (ref 0.00–0.48)

## 2011-08-08 MED ORDER — SODIUM CHLORIDE 0.9 % IV BOLUS (SEPSIS)
1000.0000 mL | Freq: Once | INTRAVENOUS | Status: AC
  Administered 2011-08-08: 1000 mL via INTRAVENOUS

## 2011-08-08 NOTE — ED Provider Notes (Signed)
History     CSN: [id number]  Arrival date & time 08/08/11  7829   First MD Initiated Contact with Patient 08/08/11 [id number]      Chief Complaint  Patient presents with  . Irregular Heart Beat  . Shortness of Breath    (Consider location/radiation/quality/duration/timing/severity/associated sxs/prior treatment) HPI  56 year old female with history of hypertension, reported transient atrial fibrillation, obesity, and GERD presents complaining of palpitation and shortness of breath which started this morning. Patient woke up with sensation of rapid heartbeat. She also reports lightheadedness and shortness of breath. She then took one aspirin and decided to come to the ED for further evaluation. When she got to the ED, she felt rapid heart rate has resolved. She continues to endorse lightheadedness and some shortness of breath, which has improved. She also reports nonproductive cough for the past 3 days denies hemoptysis. Denies fever, chills, sore throat, chest pain, back pain, nausea, vomiting, abdominal pain, diaphoresis. The last time that she felt heart palpitation was many years ago.  Patient also reports she was having lower extremity swelling to her right lower leg. She was seen by her heart Dr. recently and had a DVT study yesterday which was negative. Patient reports she is using hormone replacement cream, but not taking any birth control pill, having recent surgery, or prolonged bed rest. Reports hypertension controlled with blood pressure medication. Denies prior history of MI. Is a nonsmoker. No family history of premature CAD.  Current medications reviewed. She takes HCTZ and amlodipine valsartan combination tablet.   Past Medical History  Diagnosis Date  . Anemia   . Atrial fibrillation   . Headache   . Hypertension   . GERD (gastroesophageal reflux disease)   . Gastritis   . Fatty liver   . Diverticulosis   . Internal hemorrhoids   . Depression   . OSA (obstructive sleep  apnea)   . Asthma   . Adenomatous colon polyp 05/2002  . Hemorrhoids     Past Surgical History  Procedure Date  . Partial hysterectomy   . Appendectomy   . Breast reduction surgery   . Total abdominal hysterectomy w/ bilateral salpingoophorectomy   . Cervical fusion     Family History  Problem Relation Age of Onset  . Diabetes Mother   . Coronary artery disease Sister   . Hypertension Mother   . Hypertension Sister   . Colon cancer Neg Hx     History  Substance Use Topics  . Smoking status: Never Smoker   . Smokeless tobacco: Never Used  . Alcohol Use: No    OB History    Grav Para Term Preterm Abortions TAB SAB Ect Mult Living                  Review of Systems  All other systems reviewed and are negative.    Allergies  Review of patient's allergies indicates no known allergies.  Home Medications   Current Outpatient Rx  Name Route Sig Dispense Refill  . AMLODIPINE BESYLATE-VALSARTAN 5-160 MG PO TABS Oral Take 1 tablet by mouth daily. 90 tablet 1  . CALCIUM CARBONATE-VITAMIN D 600-400 MG-UNIT PO TABS Oral Take 1 tablet by mouth daily.      [name] CETIRIZINE HCL 10 MG PO TABS Oral Take 10 mg by mouth at bedtime as needed.      [name] HYDROCHLOROTHIAZIDE 12.5 MG PO CAPS Oral Take 1 capsule (12.5 mg total) by mouth daily. 30 capsule 0  . MULTIVITAL-M PO TABS  Oral Take 1 tablet by mouth daily.      [name] ALIGN 4 MG PO CAPS Oral Take 1 capsule by mouth daily. 14 capsule 0    There were no vitals taken for this visit.  Physical Exam  Nursing note and vitals reviewed. Constitutional: She is oriented to person, place, and time. She appears well-developed and well-nourished. No distress.       Awake, alert, nontoxic appearance  HENT:  Head: Atraumatic.  Eyes: Conjunctivae are normal. Right eye exhibits no discharge. Left eye exhibits no discharge.  Neck: Neck supple.  Cardiovascular: Normal rate and regular rhythm.   Pulmonary/Chest: Effort normal. No respiratory  distress. She has no wheezes. She has no rales. She exhibits no tenderness.  Abdominal: Soft. There is no tenderness. There is no rebound.  Musculoskeletal: She exhibits no tenderness.       ROM appears intact, no obvious focal weakness.  Trace pitting edema to right lower extremities. No palpable cord, erythema, negative Homans sign bilaterally  Neurological: She is alert and oriented to person, place, and time.       Mental status and motor strength appears intact  Skin: Skin is warm. No rash noted.  Psychiatric: She has a normal mood and affect.    ED Course  Procedures (including critical care time)  Labs Reviewed - No data to display No results found.   No diagnosis found.   Date: 08/08/2011  Rate: 87  Rhythm: normal sinus rhythm  QRS Axis: normal  Intervals: normal  ST/T Wave abnormalities: nonspecific ST changes  Conduction Disutrbances:none  Narrative Interpretation:   Old EKG Reviewed: unchanged  Results for orders placed during the hospital encounter of 08/08/11  CBC WITH DIFFERENTIAL      Component Value Range   WBC 6.1  4.0 - 10.5 K/uL   RBC 4.67  3.87 - 5.11 MIL/uL   Hemoglobin 15.4 (*) 12.0 - 15.0 g/dL   HCT 19.1  47.8 - 29.5 %   MCV 93.8  78.0 - 100.0 fL   MCH 33.0  26.0 - 34.0 pg   MCHC 35.2  30.0 - 36.0 g/dL   RDW 62.1  30.8 - 65.7 %   Platelets 359  150 - 400 K/uL   Neutrophils Relative 42 (*) 43 - 77 %   Neutro Abs 2.6  1.7 - 7.7 K/uL   Lymphocytes Relative 47 (*) 12 - 46 %   Lymphs Abs 2.9  0.7 - 4.0 K/uL   Monocytes Relative 8  3 - 12 %   Monocytes Absolute 0.5  0.1 - 1.0 K/uL   Eosinophils Relative 3  0 - 5 %   Eosinophils Absolute 0.2  0.0 - 0.7 K/uL   Basophils Relative 0  0 - 1 %   Basophils Absolute 0.0  0.0 - 0.1 K/uL  BASIC METABOLIC PANEL      Component Value Range   Sodium 143  135 - 145 mEq/L   Potassium 3.6  3.5 - 5.1 mEq/L   Chloride 106  96 - 112 mEq/L   CO2 25  19 - 32 mEq/L   Glucose, Bld 108 (*) 70 - 99 mg/dL   BUN 11  6  - 23 mg/dL   Creatinine, Ser 8.46  0.50 - 1.10 mg/dL   Calcium 9.9  8.4 - 96.2 mg/dL   GFR calc non Af Amer >90  >90 mL/min   GFR calc Af Amer >90  >90 mL/min  TROPONIN I      Component  Value Range   Troponin I <0.30  <0.30 ng/mL  D-DIMER, QUANTITATIVE      Component Value Range   D-Dimer, Quant 0.29  0.00 - 0.48 ug/mL-FEU   Dg Chest 2 View  08/08/2011  *RADIOLOGY REPORT*  Clinical Data: Shortness of breath, dizziness, history of hypertension  CHEST - 2 VIEW  Comparison: 01/28/2010; 01/24/2009; chest CT - 07/18/2008  Findings: Grossly unchanged enlarged cardiac silhouette and mediastinal contours.  No focal parenchymal opacities.  No pleural effusion or pneumothorax.  Grossly unchanged bones of the lower cervical ACDF, incompletely evaluated.  IMPRESSION: No acute cardiopulmonary disease.  Original Report Authenticated By: Waynard Reeds, M.D.   08:20:06 Vital Signs Vital Signs - ECG Heart Rate: 77 ; BP: 117/72 ; BP Location: Right arm ; BP Method: Automatic ; Patient Position, if appropriate: Lying Dellis Anes, RN [phone] Vital Signs Vital Signs - Pulse Rate: 88 ; ECG Heart Rate: 88 ; BP: 133/85 ; BP Location: Right arm ; BP Method: Automatic ; Patient Position, if appropriate: Sitting Dellis Anes, RN 08:20:44 Vital Signs Vital Signs - Pulse Rate: 90 ; ECG Heart Rate: 90 ; BP: 122/90 ; BP Location: Right arm ; BP Method: Automatic ; Patient Position, if appropriate: Standing Dellis Anes, RN  1. Heart palpitation 2. lightheadedness  MDM  Patient presents with palpitations and shortness of breath which started this morning. Her palpitations resolve when she is in the ED. Her EKG is unremarkable. Her lung exam is unremarkable and she is satting at 98% on room air.  Low suspicion for PE.  D-dimer ordered.  Discussed with my attending.     9:23 AM Patient reports she has been watching her diet and hasn't been eating drinking that she normally does. She has  monitor her sodium intake. Orthostatic vital signs is significant for a heart rate of 77 lying to a heart rate of 90 standing but no change in blood pressure. We did give patient a bolus of normal saline. Vital signs stable at this time  The remainder of her workup is unremarkable. No evidence of anemia. Her d-dimer is negative.   patient is back to baseline. Able to ambulate without any distress.  Patient is stable to follow with her PCP for further evaluation.  Strict return precaution.     Fayrene Helper, PA-C 08/08/11 1004

## 2011-08-08 NOTE — ED Notes (Signed)
Pt c/o palpitations this am starting at 0400 with SOB; pt denies pain; pt sts hx of similar a long time ago

## 2011-08-08 NOTE — Telephone Encounter (Signed)
Caller: [name]/Patient; Phone Number: [phone]; Message from caller: Patient calling to request a referral to cardiologist.  She was taken to the emergency room 08/08/11 and was told by the ER physician to call her primary doctor and ask for a cardiology referral.  Office was closed at time of patient call today 08/08/11 so a message is being sent.  Please call the patient back on 08/11/11 about this question.  Thank you.

## 2011-08-08 NOTE — ED Notes (Signed)
Pt to xray, will initiate IV upon return

## 2011-08-11 NOTE — Telephone Encounter (Signed)
Dr. Caryl Never, please see documentation below from Call a Nurse. Thank you.

## 2011-08-11 NOTE — Telephone Encounter (Signed)
Appt made for 8/6. Patient aware.

## 2011-08-11 NOTE — Telephone Encounter (Signed)
Office f/u and we can determine next step in evaluation, if indicated.

## 2011-08-12 ENCOUNTER — Encounter: Payer: Self-pay | Admitting: Family Medicine

## 2011-08-12 ENCOUNTER — Ambulatory Visit (INDEPENDENT_AMBULATORY_CARE_PROVIDER_SITE_OTHER): Payer: 59 | Admitting: Family Medicine

## 2011-08-12 VITALS — BP 130/82 | Temp 98.2°F | Wt 225.0 lb

## 2011-08-12 DIAGNOSIS — R002 Palpitations: Secondary | ICD-10-CM

## 2011-08-12 DIAGNOSIS — I10 Essential (primary) hypertension: Secondary | ICD-10-CM

## 2011-08-12 MED ORDER — ATENOLOL 25 MG PO TABS: ORAL_TABLET | ORAL | Status: DC

## 2011-08-12 NOTE — Patient Instructions (Addendum)
Minimize caffeine use. 

## 2011-08-12 NOTE — Progress Notes (Signed)
  Subjective:    Patient ID: [name], female    DOB: November 27, 1955, 56 y.o.   MRN: [id number]  HPI  ER followup. Patient has remote history of transient atrial fibrillation as well as hypertension, obesity, and GERD. She woke up with sensation of rapid heartbeat few days ago. Did not check her pulse. She also had some associated dyspnea but no real chest pain. She took aspirin and went to the emergency department. She was apparently in sinus rhythm an episode had resolved after she got there. She relates remote history of atrial fibrillation about 10 years ago. Took as needed beta blocker then but not for several years. Normal thyroid function last year.  Dealing with increased stress of sister having surgery several weeks ago. Patient has been consuming more caffeine recently. No decongestant use. She's had no further episodes of palpitations since emergency department visit recently  Past Medical History  Diagnosis Date  . Anemia   . Atrial fibrillation   . Headache   . Hypertension   . GERD (gastroesophageal reflux disease)   . Gastritis   . Fatty liver   . Diverticulosis   . Internal hemorrhoids   . Depression   . OSA (obstructive sleep apnea)   . Asthma   . Adenomatous colon polyp 05/2002  . Hemorrhoids    Past Surgical History  Procedure Date  . Partial hysterectomy   . Appendectomy   . Breast reduction surgery   . Total abdominal hysterectomy w/ bilateral salpingoophorectomy   . Cervical fusion     reports that she has never smoked. She has never used smokeless tobacco. She reports that she does not drink alcohol or use illicit drugs. family history includes Coronary artery disease in her sister; Diabetes in her mother; and Hypertension in her mother and sister.  There is no history of Colon cancer. No Known Allergies   Review of Systems  Constitutional: Negative for appetite change, fatigue and unexpected weight change.  Respiratory: Negative for shortness of  breath.   Cardiovascular: Positive for palpitations. Negative for chest pain and leg swelling.  Gastrointestinal: Negative for abdominal pain.  Neurological: Negative for dizziness and syncope.       Objective:   Physical Exam  Constitutional: She appears well-developed and well-nourished.  Neck: Neck supple. No thyromegaly present.  Cardiovascular: Normal rate and regular rhythm.   Pulmonary/Chest: Effort normal and breath sounds normal. No respiratory distress. She has no wheezes. She has no rales.  Musculoskeletal: She exhibits no edema.          Assessment & Plan:  Recent palpitation. Question transient atrial fibrillation. No episodes since then. We discussed options including event monitor but at this point she wishes to observe. Prescription for atenolol 25 mg one every 12 hours as needed for palpitations. If she has elevated heart rate we have instructed her how to check pulse. If she has persistent elevated pulse after atenolol to follow for further evaluation. Blood pressures been well controlled. Taper slowly caffeine use

## 2011-08-28 ENCOUNTER — Other Ambulatory Visit (INDEPENDENT_AMBULATORY_CARE_PROVIDER_SITE_OTHER): Payer: 59

## 2011-08-28 DIAGNOSIS — Z0000 Encounter for general adult medical examination without abnormal findings: Secondary | ICD-10-CM

## 2011-08-28 LAB — CBC WITH DIFFERENTIAL/PLATELET
Basophils Absolute: 0 10*3/uL (ref 0.0–0.1)
Basophils Relative: 0.6 % (ref 0.0–3.0)
Eosinophils Absolute: 0.2 10*3/uL (ref 0.0–0.7)
Eosinophils Relative: 3 % (ref 0.0–5.0)
HCT: 42.3 % (ref 36.0–46.0)
Hemoglobin: 13.8 g/dL (ref 12.0–15.0)
Lymphocytes Relative: 45.8 % (ref 12.0–46.0)
Lymphs Abs: 2.7 10*3/uL (ref 0.7–4.0)
MCHC: 32.7 g/dL (ref 30.0–36.0)
MCV: 98.3 fl (ref 78.0–100.0)
Monocytes Absolute: 0.6 10*3/uL (ref 0.1–1.0)
Monocytes Relative: 9.6 % (ref 3.0–12.0)
Neutro Abs: 2.4 10*3/uL (ref 1.4–7.7)
Neutrophils Relative %: 41 % — ABNORMAL LOW (ref 43.0–77.0)
Platelets: 356 10*3/uL (ref 150.0–400.0)
RBC: 4.3 Mil/uL (ref 3.87–5.11)
RDW: 13.6 % (ref 11.5–14.6)
WBC: 5.8 10*3/uL (ref 4.5–10.5)

## 2011-08-28 LAB — BASIC METABOLIC PANEL
BUN: 9 mg/dL (ref 6–23)
CO2: 26 mEq/L (ref 19–32)
Calcium: 9.3 mg/dL (ref 8.4–10.5)
Chloride: 106 mEq/L (ref 96–112)
Creatinine, Ser: 0.7 mg/dL (ref 0.4–1.2)
GFR: 113.87 mL/min (ref >60.00)
Glucose, Bld: 86 mg/dL (ref 70–99)
Potassium: 4.1 mEq/L (ref 3.5–5.1)
Sodium: 139 mEq/L (ref 135–145)

## 2011-08-28 LAB — LIPID PANEL
Cholesterol: 133 mg/dL (ref 0–200)
HDL: 45.3 mg/dL (ref >39.00)
LDL Cholesterol: 67 mg/dL (ref 0–99)
Total CHOL/HDL Ratio: 3
Triglycerides: 105 mg/dL (ref 0.0–149.0)
VLDL: 21 mg/dL (ref 0.0–40.0)

## 2011-08-28 LAB — POCT URINALYSIS DIPSTICK
Bilirubin, UA: NEGATIVE
Glucose, UA: NEGATIVE
Ketones, UA: NEGATIVE
Leukocytes, UA: NEGATIVE
Nitrite, UA: NEGATIVE
Protein, UA: NEGATIVE
Spec Grav, UA: 1.02
Urobilinogen, UA: 0.2
pH, UA: 6.5

## 2011-08-28 LAB — HEPATIC FUNCTION PANEL
ALT: 30 U/L (ref 0–35)
AST: 21 U/L (ref 0–37)
Albumin: 3.9 g/dL (ref 3.5–5.2)
Alkaline Phosphatase: 57 U/L (ref 39–117)
Bilirubin, Direct: 0.1 mg/dL (ref 0.0–0.3)
Total Bilirubin: 0.7 mg/dL (ref 0.3–1.2)
Total Protein: 7.3 g/dL (ref 6.0–8.3)

## 2011-08-28 LAB — TSH: TSH: 1.08 u[IU]/mL (ref 0.35–5.50)

## 2011-09-04 ENCOUNTER — Encounter: Payer: Self-pay | Admitting: Family Medicine

## 2011-09-04 ENCOUNTER — Ambulatory Visit (INDEPENDENT_AMBULATORY_CARE_PROVIDER_SITE_OTHER): Payer: 59 | Admitting: Family Medicine

## 2011-09-04 VITALS — BP 130/80 | HR 72 | Temp 98.2°F | Resp 12 | Ht 64.5 in | Wt 228.0 lb

## 2011-09-04 DIAGNOSIS — R600 Localized edema: Secondary | ICD-10-CM

## 2011-09-04 DIAGNOSIS — R609 Edema, unspecified: Secondary | ICD-10-CM

## 2011-09-04 DIAGNOSIS — R319 Hematuria, unspecified: Secondary | ICD-10-CM

## 2011-09-04 DIAGNOSIS — Z0000 Encounter for general adult medical examination without abnormal findings: Secondary | ICD-10-CM

## 2011-09-04 DIAGNOSIS — I10 Essential (primary) hypertension: Secondary | ICD-10-CM

## 2011-09-04 LAB — POCT URINALYSIS DIPSTICK
Bilirubin, UA: NEGATIVE
Glucose, UA: NEGATIVE
Ketones, UA: NEGATIVE
Leukocytes, UA: NEGATIVE
Nitrite, UA: NEGATIVE
Protein, UA: NEGATIVE
Spec Grav, UA: <=1.005
Urobilinogen, UA: 0.2
pH, UA: 6

## 2011-09-04 LAB — MICROALBUMIN / CREATININE URINE RATIO
Creatinine,U: 40.1 mg/dL
Microalb Creat Ratio: 1.5 mg/g (ref 0.0–30.0)
Microalb, Ur: 0.6 mg/dL (ref 0.0–1.9)

## 2011-09-04 MED ORDER — AMLODIPINE BESYLATE-VALSARTAN 5-160 MG PO TABS: 1.0000 | ORAL_TABLET | Freq: Every day | ORAL | Status: DC

## 2011-09-04 MED ORDER — TETANUS-DIPHTH-ACELL PERTUSSIS 5-2.5-18.5 LF-MCG/0.5 IM SUSP: 0.5000 mL | Freq: Once | INTRAMUSCULAR | Status: DC

## 2011-09-04 MED ORDER — HYDROCHLOROTHIAZIDE 12.5 MG PO CAPS: 12.5000 mg | ORAL_CAPSULE | Freq: Every day | ORAL | Status: DC

## 2011-09-04 NOTE — Progress Notes (Signed)
Subjective:    Patient ID: [name], female    DOB: 1955-02-09, 56 y.o.   MRN: [id number]  HPI  Patient seen for complete physical. She actually sees gynecologist for Pap smears and breast exams. She's had mammogram within the past year. Last tetanus unknown. Colonoscopy repeat few years ago. Recent issues with palpitations -been stable. Rarely taking atenolol. No recent chest pains. No dizziness.  Had some recent leg edema after running out of HCTZ. She also takes combination amlodipine/ Valsartan. Blood pressure generally well-controlled. Increased problems with hot flashes. She's been tried with topical estrogens by GYN without much success.  Patient nonsmoker. No regular alcohol use. Continue to lose weight.  Past Medical History  Diagnosis Date  . Anemia   . Atrial fibrillation   . Headache   . Hypertension   . GERD (gastroesophageal reflux disease)   . Gastritis   . Fatty liver   . Diverticulosis   . Internal hemorrhoids   . Depression   . OSA (obstructive sleep apnea)   . Asthma   . Adenomatous colon polyp 05/2002  . Hemorrhoids    Past Surgical History  Procedure Date  . Partial hysterectomy   . Appendectomy   . Breast reduction surgery   . Total abdominal hysterectomy w/ bilateral salpingoophorectomy   . Cervical fusion     reports that she has never smoked. She has never used smokeless tobacco. She reports that she does not drink alcohol or use illicit drugs. family history includes Coronary artery disease in her sister; Diabetes in her mother; and Hypertension in her mother and sister.  There is no history of Colon cancer. No Known Allergies    Review of Systems  Constitutional: Negative for fever, activity change, appetite change, fatigue and unexpected weight change.  HENT: Negative for hearing loss, ear pain, sore throat and trouble swallowing.   Eyes: Negative for visual disturbance.  Respiratory: Negative for cough and shortness of breath.     Cardiovascular: Negative for chest pain and palpitations.  Gastrointestinal: Negative for abdominal pain, diarrhea, constipation and blood in stool.  Genitourinary: Negative for dysuria and hematuria.  Musculoskeletal: Negative for myalgias, back pain and arthralgias.  Skin: Negative for rash.  Neurological: Negative for dizziness, syncope and headaches.  Hematological: Negative for adenopathy.  Psychiatric/Behavioral: Negative for confusion and dysphoric mood.       Objective:   Physical Exam  Constitutional: She is oriented to person, place, and time. She appears well-developed and well-nourished.  HENT:  Head: Normocephalic and atraumatic.  Eyes: EOM are normal. Pupils are equal, round, and reactive to light.  Neck: Normal range of motion. Neck supple. No thyromegaly present.  Cardiovascular: Normal rate, regular rhythm and normal heart sounds.   No murmur heard. Pulmonary/Chest: Breath sounds normal. No respiratory distress. She has no wheezes. She has no rales.  Abdominal: Soft. Bowel sounds are normal. She exhibits no distension and no mass. There is no tenderness. There is no rebound and no guarding.  Genitourinary:       Per GYN  Musculoskeletal: Normal range of motion. She exhibits edema.       Trace edema legs and ankles bilaterally  Lymphadenopathy:    She has no cervical adenopathy.  Neurological: She is alert and oriented to person, place, and time. She displays normal reflexes. No cranial nerve deficit.  Skin: No rash noted.  Psychiatric: She has a normal mood and affect. Her behavior is normal. Judgment and thought content normal.  Assessment & Plan:  #1 health maintenance. Tetanus booster given. Labs reviewed with patient -all favorable. Work on weight loss. Consistent exercise. #2 hypertension. Stable. Refilled medications for one year  #3 trace hematuria on urine dipstick. Repeat urine dipstick today. It positive send for urine micro-

## 2011-09-04 NOTE — Patient Instructions (Addendum)
Continue to work on weight loss. Regular exercise such as walking, as tolerated

## 2011-09-05 ENCOUNTER — Other Ambulatory Visit: Payer: 59

## 2011-09-05 ENCOUNTER — Other Ambulatory Visit: Payer: Self-pay | Admitting: *Deleted

## 2011-09-05 DIAGNOSIS — R319 Hematuria, unspecified: Secondary | ICD-10-CM

## 2011-09-05 NOTE — Addendum Note (Signed)
Addended by: Bonnye Fava on: 09/05/2011 12:43 PM   Modules accepted: Orders

## 2011-09-07 LAB — URINALYSIS, MICROSCOPIC ONLY: Casts: NONE SEEN

## 2011-09-09 NOTE — Progress Notes (Signed)
Quick Note:  Pt informed on mobile VM ______ 

## 2011-09-09 NOTE — Progress Notes (Signed)
Quick Note:  Pt has been informed and will return for Urine collection ______

## 2011-10-20 IMAGING — CR DG CHEST 2V
2 series · 2 of 2 positions shown · non-contrast
Comparison: 01/24/2009

CLINICAL DATA: Acute bronchitis.  Cough and shortness of breath.

CHEST - 2 VIEW

[view not recorded (1 of 2)]
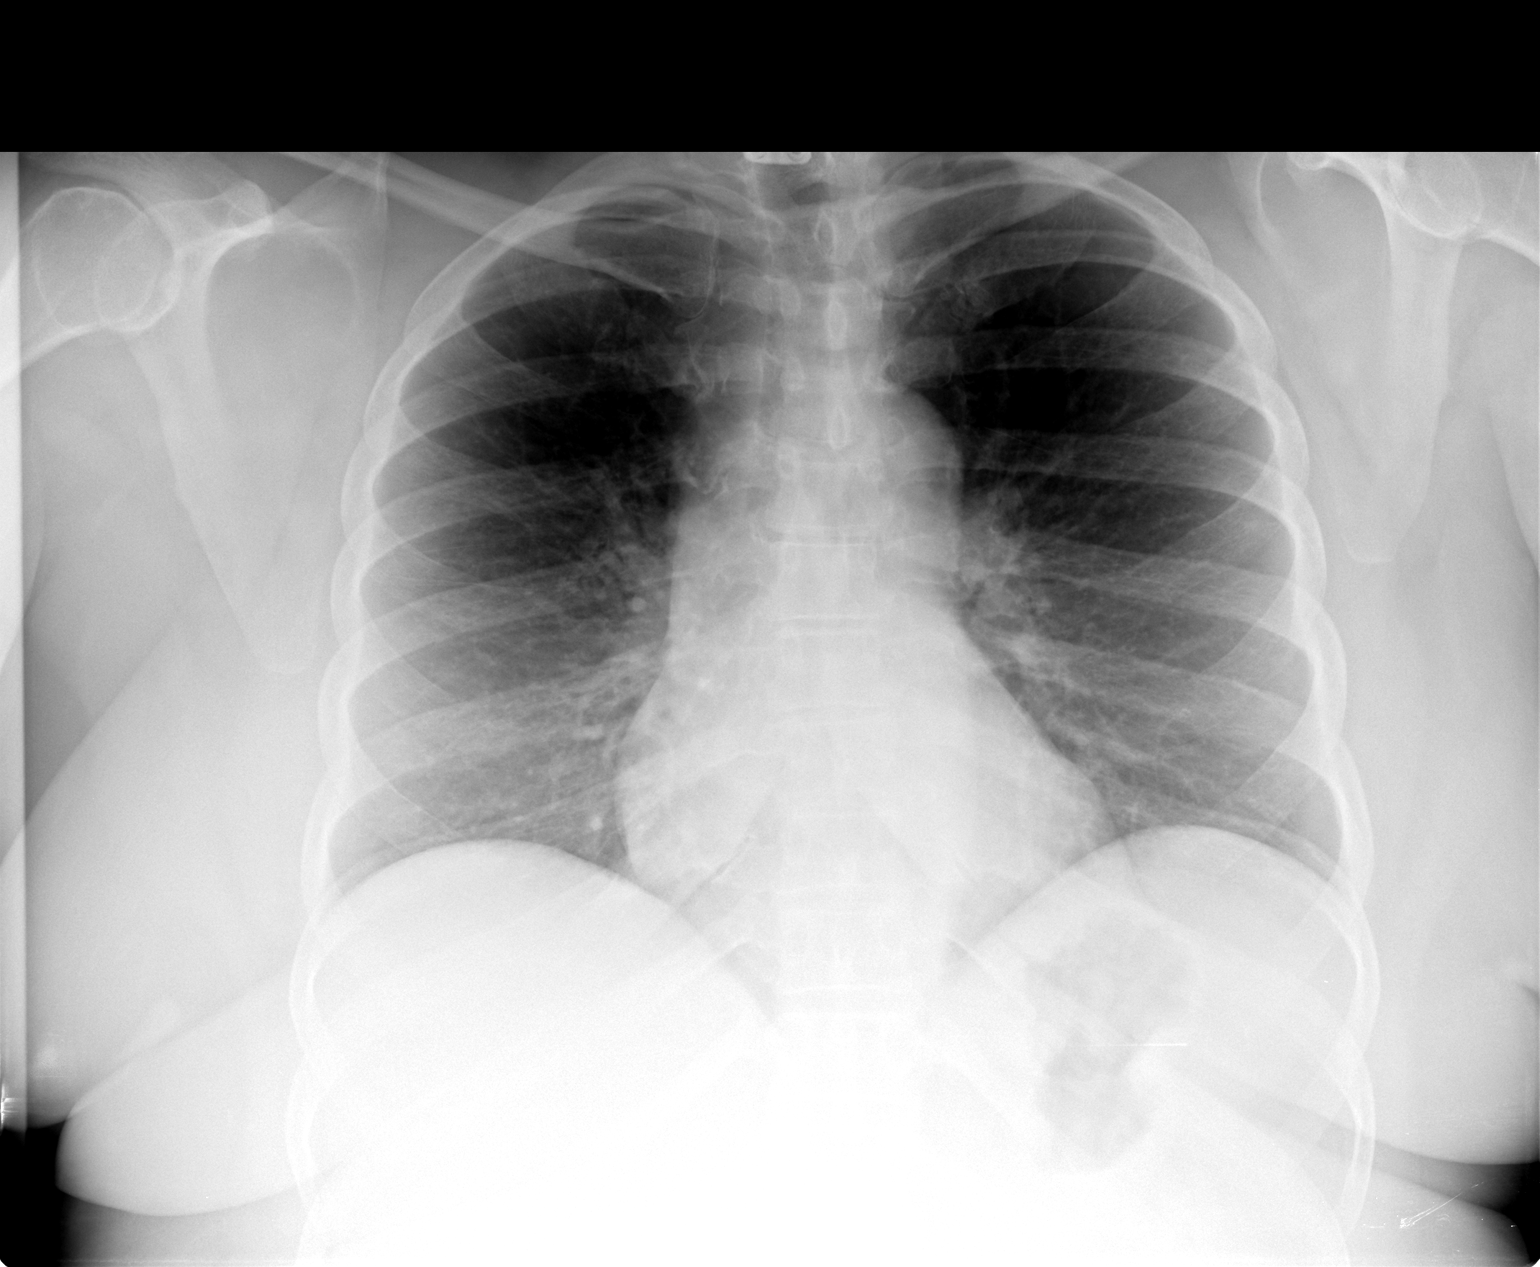

[view not recorded (2 of 2)]
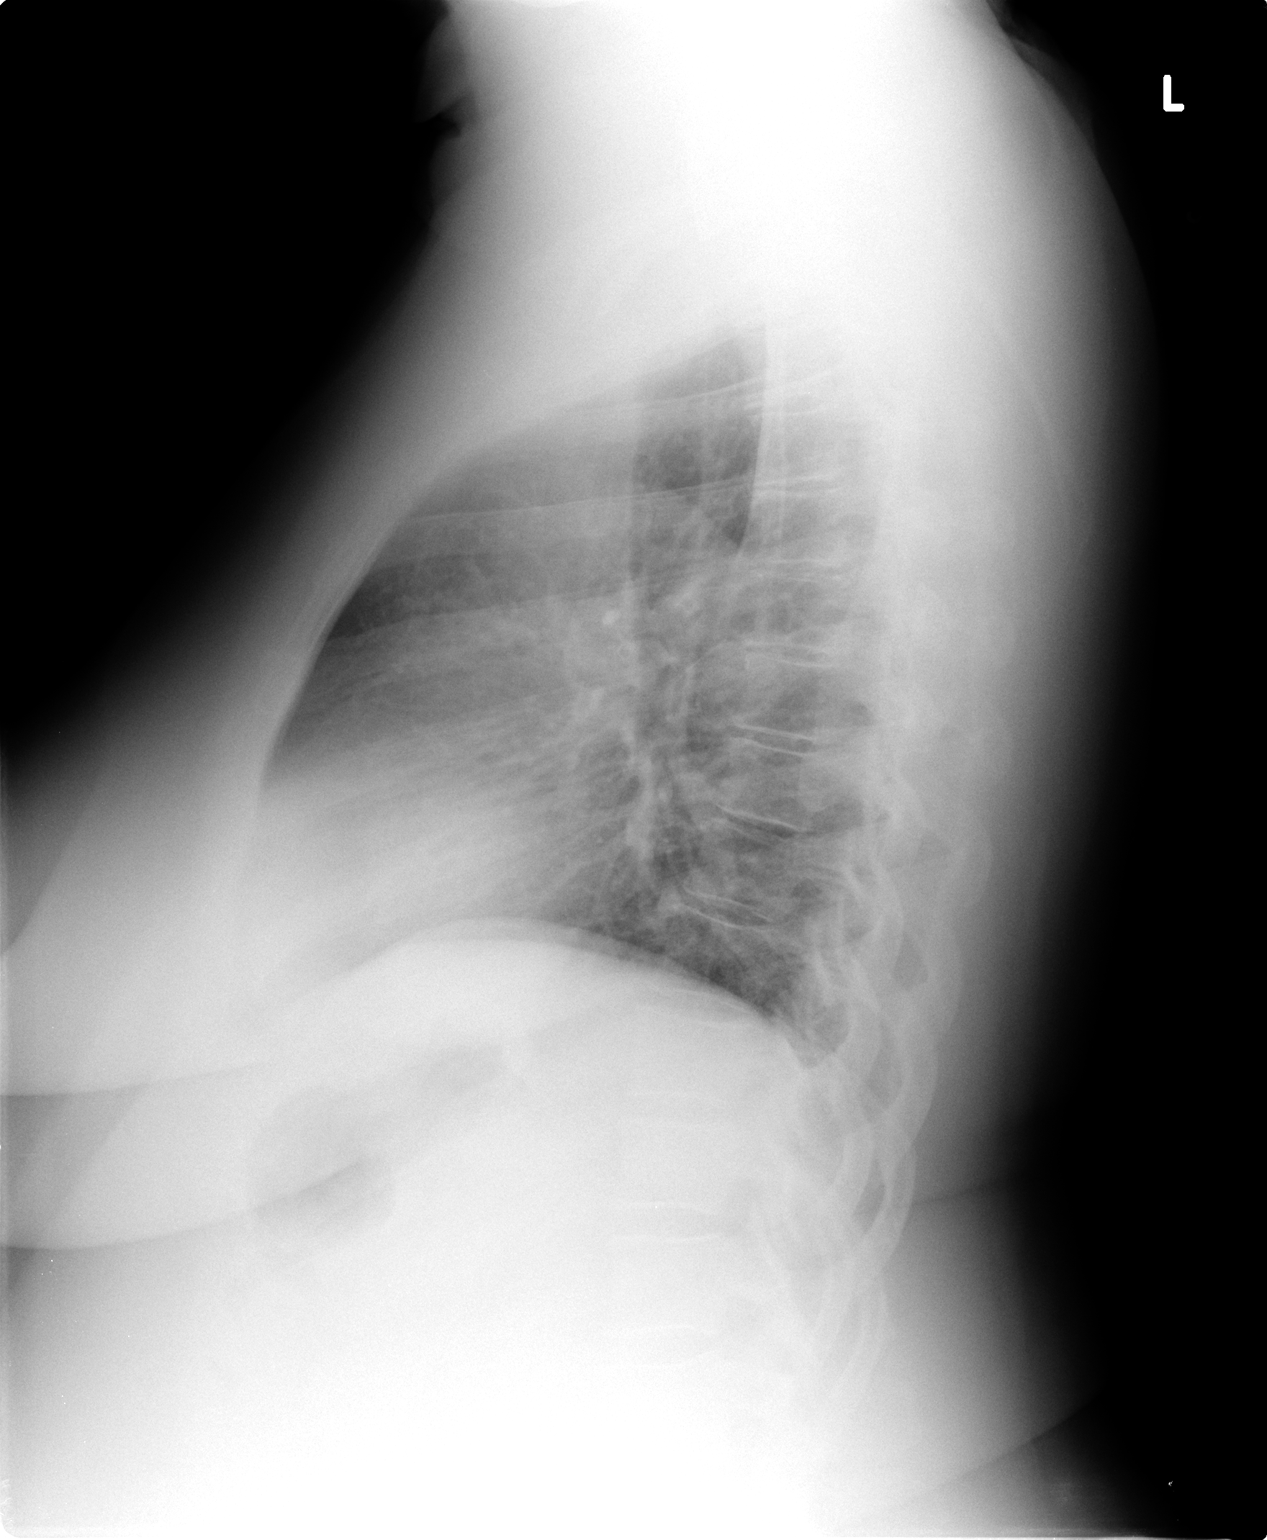

[2 of 2 positions shown; findings below may reference images not displayed]

FINDINGS: The lungs are clear without focal infiltrate, edema,
pneumothorax or pleural effusion. The cardiopericardial silhouette
is within normal limits for size.  No acute bony abnormality.
IMPRESSION: Stable.  No acute cardiopulmonary process.

## 2011-12-18 ENCOUNTER — Telehealth: Payer: Self-pay | Admitting: Family Medicine

## 2011-12-18 MED ORDER — AMLODIPINE BESYLATE-VALSARTAN 5-160 MG PO TABS: 1.0000 | ORAL_TABLET | Freq: Every day | ORAL | Status: DC

## 2011-12-18 NOTE — Telephone Encounter (Signed)
Patient called stating that she would like to have some samples of exforge 5-160mg  1poqd while waiting for her mail order rx. Please assist.

## 2012-01-01 ENCOUNTER — Encounter: Payer: Self-pay | Admitting: Family Medicine

## 2012-01-01 ENCOUNTER — Ambulatory Visit (INDEPENDENT_AMBULATORY_CARE_PROVIDER_SITE_OTHER): Payer: 59 | Admitting: Family Medicine

## 2012-01-01 VITALS — BP 110/62 | Temp 98.0°F | Wt 224.0 lb

## 2012-01-01 DIAGNOSIS — B9789 Other viral agents as the cause of diseases classified elsewhere: Secondary | ICD-10-CM

## 2012-01-01 DIAGNOSIS — B349 Viral infection, unspecified: Secondary | ICD-10-CM

## 2012-01-01 MED ORDER — METHYLPREDNISOLONE ACETATE 80 MG/ML IJ SUSP
80.0000 mg | Freq: Once | INTRAMUSCULAR | Status: AC
  Administered 2012-01-01: 80 mg via INTRAMUSCULAR

## 2012-01-01 MED ORDER — AMLODIPINE BESYLATE-VALSARTAN 5-160 MG PO TABS: 1.0000 | ORAL_TABLET | Freq: Every day | ORAL | Status: DC

## 2012-01-01 MED ORDER — HYDROCHLOROTHIAZIDE 12.5 MG PO CAPS: 12.5000 mg | ORAL_CAPSULE | Freq: Every day | ORAL | Status: DC

## 2012-01-01 MED ORDER — ALBUTEROL SULFATE HFA 108 (90 BASE) MCG/ACT IN AERS: 2.0000 | INHALATION_SPRAY | RESPIRATORY_TRACT | Status: DC | PRN

## 2012-01-01 NOTE — Patient Instructions (Addendum)

## 2012-01-01 NOTE — Progress Notes (Signed)
  Subjective:    Patient ID: [name], female    DOB: 05-04-1955, 56 y.o.   MRN: [id number]  HPI  Acute visit. Also last Thursday of chills, body aches, dry cough, and nasal congestion. Had fever up to 101 over the weekend but resolved at this time. She has some wheezing and persistent cough mostly at this time. Nonsmoker. Husband had similar illness. Patient denies any nausea or vomiting. No dyspnea. No pleuritic pain. She had flu vaccine   Review of Systems  Constitutional: Positive for fatigue.  HENT: Positive for congestion and sore throat.   Respiratory: Positive for cough and wheezing.   Cardiovascular: Negative for chest pain.       Objective:   Physical Exam  Constitutional: She appears well-developed and well-nourished.  HENT:  Right Ear: External ear normal.  Left Ear: External ear normal.  Mouth/Throat: Oropharynx is clear and moist.  Neck: Neck supple.  Cardiovascular: Normal rate and regular rhythm.   Pulmonary/Chest:       Patient has some diffuse faint expiratory wheezes. Symmetric breath sounds. No rales  Lymphadenopathy:    She has no cervical adenopathy.          Assessment & Plan:  Viral syndrome. She has cough with reactive airway component. Refill albuterol for as needed use. Prednisone use discussed. She requests injection as she's had fewer side effects with injection versus oral. Depo-Medrol 80 mg IM given. Follow up immediately for any fever.

## 2012-01-28 ENCOUNTER — Observation Stay (HOSPITAL_COMMUNITY)
Admission: EM | Admit: 2012-01-28 | Discharge: 2012-01-29 | Disposition: A | Discharge status: Home / Self Care | Payer: 59 | Attending: Family Medicine | Admitting: Family Medicine

## 2012-01-28 ENCOUNTER — Emergency Department (HOSPITAL_COMMUNITY): Payer: 59

## 2012-01-28 ENCOUNTER — Telehealth: Payer: Self-pay | Admitting: Family Medicine

## 2012-01-28 ENCOUNTER — Encounter (HOSPITAL_COMMUNITY): Payer: Self-pay

## 2012-01-28 DIAGNOSIS — R079 Chest pain, unspecified: Secondary | ICD-10-CM | POA: Diagnosis present

## 2012-01-28 DIAGNOSIS — J45909 Unspecified asthma, uncomplicated: Secondary | ICD-10-CM | POA: Insufficient documentation

## 2012-01-28 DIAGNOSIS — I10 Essential (primary) hypertension: Secondary | ICD-10-CM | POA: Insufficient documentation

## 2012-01-28 DIAGNOSIS — I4891 Unspecified atrial fibrillation: Secondary | ICD-10-CM | POA: Insufficient documentation

## 2012-01-28 DIAGNOSIS — I209 Angina pectoris, unspecified: Secondary | ICD-10-CM

## 2012-01-28 DIAGNOSIS — R0602 Shortness of breath: Secondary | ICD-10-CM | POA: Insufficient documentation

## 2012-01-28 DIAGNOSIS — R61 Generalized hyperhidrosis: Secondary | ICD-10-CM | POA: Insufficient documentation

## 2012-01-28 DIAGNOSIS — K760 Fatty (change of) liver, not elsewhere classified: Secondary | ICD-10-CM | POA: Diagnosis present

## 2012-01-28 DIAGNOSIS — Z79899 Other long term (current) drug therapy: Secondary | ICD-10-CM | POA: Insufficient documentation

## 2012-01-28 DIAGNOSIS — K7689 Other specified diseases of liver: Secondary | ICD-10-CM | POA: Insufficient documentation

## 2012-01-28 DIAGNOSIS — R0789 Other chest pain: Principal | ICD-10-CM | POA: Insufficient documentation

## 2012-01-28 DIAGNOSIS — M79609 Pain in unspecified limb: Secondary | ICD-10-CM | POA: Insufficient documentation

## 2012-01-28 DIAGNOSIS — K219 Gastro-esophageal reflux disease without esophagitis: Secondary | ICD-10-CM | POA: Insufficient documentation

## 2012-01-28 DIAGNOSIS — E669 Obesity, unspecified: Secondary | ICD-10-CM

## 2012-01-28 DIAGNOSIS — G4733 Obstructive sleep apnea (adult) (pediatric): Secondary | ICD-10-CM | POA: Insufficient documentation

## 2012-01-28 DIAGNOSIS — R110 Nausea: Secondary | ICD-10-CM | POA: Insufficient documentation

## 2012-01-28 LAB — CBC
HCT: 41.5 % (ref 36.0–46.0)
Hemoglobin: 14.4 g/dL (ref 12.0–15.0)
MCH: 33.4 pg (ref 26.0–34.0)
MCHC: 34.7 g/dL (ref 30.0–36.0)
MCV: 96.3 fL (ref 78.0–100.0)
Platelets: 321 10*3/uL (ref 150–400)
RBC: 4.31 MIL/uL (ref 3.87–5.11)
RDW: 12.8 % (ref 11.5–15.5)
WBC: 8 10*3/uL (ref 4.0–10.5)

## 2012-01-28 LAB — TROPONIN I
Troponin I: <0.3 ng/mL (ref <0.30)
Troponin I: <0.3 ng/mL (ref <0.30)

## 2012-01-28 LAB — BASIC METABOLIC PANEL
BUN: 8 mg/dL (ref 6–23)
CO2: 24 mEq/L (ref 19–32)
Calcium: 9.9 mg/dL (ref 8.4–10.5)
Chloride: 102 mEq/L (ref 96–112)
Creatinine, Ser: 0.68 mg/dL (ref 0.50–1.10)
GFR calc Af Amer: >90 mL/min (ref >90)
GFR calc non Af Amer: >90 mL/min (ref >90)
Glucose, Bld: 94 mg/dL (ref 70–99)
Potassium: 3.7 mEq/L (ref 3.5–5.1)
Sodium: 139 mEq/L (ref 135–145)

## 2012-01-28 LAB — POCT I-STAT TROPONIN I: Troponin i, poc: 0 ng/mL (ref 0.00–0.08)

## 2012-01-28 MED ORDER — METHOCARBAMOL 500 MG PO TABS
500.0000 mg | ORAL_TABLET | Freq: Three times a day (TID) | ORAL | Status: DC
  Administered 2012-01-28: 500 mg via ORAL
  Filled 2012-01-28 (×4): qty 1

## 2012-01-28 MED ORDER — ATENOLOL 25 MG PO TABS
25.0000 mg | ORAL_TABLET | Freq: Every day | ORAL | Status: DC
  Filled 2012-01-28: qty 1

## 2012-01-28 MED ORDER — NITROGLYCERIN 0.4 MG SL SUBL
0.4000 mg | SUBLINGUAL_TABLET | SUBLINGUAL | Status: DC | PRN
  Administered 2012-01-28: 0.4 mg via SUBLINGUAL
  Filled 2012-01-28: qty 25

## 2012-01-28 MED ORDER — MORPHINE SULFATE 2 MG/ML IJ SOLN: 1.0000 mg | INTRAMUSCULAR | Status: DC | PRN

## 2012-01-28 MED ORDER — AMLODIPINE BESYLATE-VALSARTAN 5-160 MG PO TABS: 1.0000 | ORAL_TABLET | Freq: Every day | ORAL | Status: DC

## 2012-01-28 MED ORDER — OXYCODONE-ACETAMINOPHEN 5-325 MG PO TABS: 1.0000 | ORAL_TABLET | Freq: Four times a day (QID) | ORAL | Status: DC | PRN

## 2012-01-28 MED ORDER — SODIUM CHLORIDE 0.9 % IJ SOLN
3.0000 mL | Freq: Two times a day (BID) | INTRAMUSCULAR | Status: DC
  Administered 2012-01-28: 3 mL via INTRAVENOUS

## 2012-01-28 MED ORDER — ADULT MULTIVITAMIN W/MINERALS CH
1.0000 | ORAL_TABLET | Freq: Every day | ORAL | Status: DC
Start: 2012-01-29 — End: 2012-01-29
  Administered 2012-01-29: 1 via ORAL
  Filled 2012-01-28: qty 1

## 2012-01-28 MED ORDER — SODIUM CHLORIDE 0.9 % IV SOLN
Freq: Once | INTRAVENOUS | Status: AC
  Administered 2012-01-28: 16:00:00 via INTRAVENOUS

## 2012-01-28 MED ORDER — CALCIUM CARBONATE ANTACID 500 MG PO CHEW
2.0000 | CHEWABLE_TABLET | Freq: Every day | ORAL | Status: DC | PRN
  Filled 2012-01-28: qty 2

## 2012-01-28 MED ORDER — ACETAMINOPHEN 325 MG PO TABS
650.0000 mg | ORAL_TABLET | Freq: Four times a day (QID) | ORAL | Status: DC | PRN
  Administered 2012-01-28: 650 mg via ORAL
  Filled 2012-01-28: qty 2

## 2012-01-28 MED ORDER — NITROGLYCERIN 2 % TD OINT
0.5000 [in_us] | TOPICAL_OINTMENT | Freq: Once | TRANSDERMAL | Status: AC
  Administered 2012-01-28: 0.5 [in_us] via TOPICAL
  Filled 2012-01-28: qty 1

## 2012-01-28 MED ORDER — IOHEXOL 350 MG/ML SOLN
100.0000 mL | Freq: Once | INTRAVENOUS | Status: AC | PRN
  Administered 2012-01-28: 100 mL via INTRAVENOUS

## 2012-01-28 MED ORDER — ACETAMINOPHEN 650 MG RE SUPP: 650.0000 mg | Freq: Four times a day (QID) | RECTAL | Status: DC | PRN

## 2012-01-28 MED ORDER — IBUPROFEN 400 MG PO TABS
400.0000 mg | ORAL_TABLET | Freq: Four times a day (QID) | ORAL | Status: DC | PRN
  Filled 2012-01-28: qty 1

## 2012-01-28 MED ORDER — ASPIRIN 81 MG PO TABS: 81.0000 mg | ORAL_TABLET | Freq: Every day | ORAL | Status: DC

## 2012-01-28 MED ORDER — ONDANSETRON HCL 4 MG/2ML IJ SOLN: 4.0000 mg | Freq: Four times a day (QID) | INTRAMUSCULAR | Status: DC | PRN

## 2012-01-28 MED ORDER — ONDANSETRON HCL 4 MG PO TABS: 4.0000 mg | ORAL_TABLET | Freq: Four times a day (QID) | ORAL | Status: DC | PRN

## 2012-01-28 MED ORDER — HYDROCHLOROTHIAZIDE 12.5 MG PO CAPS
12.5000 mg | ORAL_CAPSULE | Freq: Every day | ORAL | Status: DC
  Administered 2012-01-29: 12.5 mg via ORAL
  Filled 2012-01-28: qty 1

## 2012-01-28 MED ORDER — SODIUM CHLORIDE 0.9 % IV BOLUS (SEPSIS)
1000.0000 mL | Freq: Once | INTRAVENOUS | Status: AC
  Administered 2012-01-28: 1000 mL via INTRAVENOUS

## 2012-01-28 MED ORDER — ASPIRIN EC 81 MG PO TBEC
81.0000 mg | DELAYED_RELEASE_TABLET | Freq: Every day | ORAL | Status: DC
  Administered 2012-01-29: 81 mg via ORAL
  Filled 2012-01-28: qty 1

## 2012-01-28 MED ORDER — ALBUTEROL SULFATE HFA 108 (90 BASE) MCG/ACT IN AERS: 2.0000 | INHALATION_SPRAY | RESPIRATORY_TRACT | Status: DC | PRN

## 2012-01-28 MED ORDER — MULTIVITAL-M PO TABS: 1.0000 | ORAL_TABLET | Freq: Every day | ORAL | Status: DC

## 2012-01-28 MED ORDER — ASPIRIN 81 MG PO CHEW
162.0000 mg | CHEWABLE_TABLET | Freq: Once | ORAL | Status: AC
  Administered 2012-01-28: 162 mg via ORAL
  Filled 2012-01-28: qty 2

## 2012-01-28 MED ORDER — IRBESARTAN 150 MG PO TABS
150.0000 mg | ORAL_TABLET | Freq: Every day | ORAL | Status: DC
  Administered 2012-01-29: 150 mg via ORAL
  Filled 2012-01-28: qty 1

## 2012-01-28 MED ORDER — HEPARIN SODIUM (PORCINE) 5000 UNIT/ML IJ SOLN
5000.0000 [IU] | Freq: Three times a day (TID) | INTRAMUSCULAR | Status: DC
  Administered 2012-01-28 – 2012-01-29 (×2): 5000 [IU] via SUBCUTANEOUS
  Filled 2012-01-28 (×5): qty 1

## 2012-01-28 MED ORDER — AMLODIPINE BESYLATE 5 MG PO TABS
5.0000 mg | ORAL_TABLET | Freq: Every day | ORAL | Status: DC
  Administered 2012-01-29: 5 mg via ORAL
  Filled 2012-01-28: qty 1

## 2012-01-28 NOTE — Telephone Encounter (Signed)
Patient Information:  Caller Name: Rashauna  Phone: [phone]  Patient: [name], [name]  Gender: Female  DOB: 1955-12-10  Age: 57 Years  PCP: Evelena Peat (Family Practice)  Pregnant: No  Office Follow Up:  Does the office need to follow up with this patient?: No  Instructions For The Office: N/A   Symptoms  Reason For Call & Symptoms: chest pain; worse with deep breath and radiates to back.  Pt reports it is constant; 5/10.  Pt does also reports SOB; denies any nausea.  Pt reports some sweating with the pain  Reviewed Health History In EMR: Yes  Reviewed Medications In EMR: Yes  Reviewed Allergies In EMR: Yes  Reviewed Surgeries / Procedures: Yes  Date of Onset of Symptoms: 01/27/2012  Treatments Tried: Tums, Baby ASA  Treatments Tried Worked: No OB / GYN:  LMP: Unknown  Guideline(s) Used:  Chest Pain  Disposition Per Guideline:   Call EMS 911 Now  Reason For Disposition Reached:   Chest pain lasting longer than 5 minutes and ANY of the following:  Over 49 years old Over 72 years old and at least one cardiac risk factor (i.e., high blood pressure, diabetes, high cholesterol, obesity, smoker or strong family history of heart disease) Pain is crushing, pressure-like, or heavy  Took nitroglycerin and chest pain was not relieved History of heart disease (i.e., angina, heart attack, bypass surgery, angioplasty, CHF)  Advice Given:  N/A  Patient Refused Recommendation:  Patient Will Go To ED  pt states she is at work but will have coworker to drive her; RN advised patient that EMS would be able to transport her safely and monitor her enroute.

## 2012-01-28 NOTE — ED Notes (Signed)
Dr. Elmahi at the bedside.  

## 2012-01-28 NOTE — H&P (Signed)
[facility] History and Physical  [name] NGE:[id number] DOB: May 27, 1955 DOA: 01/28/2012  Referring physician: Derwood Kaplan, MD  PCP: Kristian Covey, MD   Chief Complaint: Chest pain  HPI: [name] is a 57 y.o. female with past medical history of hypertension, obesity and fatty liver disease. Patient came to the hospital because of chest pain. Patient started complaining about chest pain Monday night, she was sitting the chest pain comes and goes, left-sided, radiates to her left shoulder and her back. The pain was pressure-like, she noticed the pain increases with movement of her right arm and when she sleeps on her left side. She does remember anything made the pain better, last night she has diaphoresis and she got nauseated so she reported to the hospital today after she called her primary care physician office. Upon initial evaluation in the emergency department for a set of cardiac enzymes negative, 12-lead EKG showed nonspecific T-wave changes with ST depression in the inferior leads which less than 1 mm. On my exam patient has totally reproducible chest and back pain suggesting muscular skeletal origin. Patient will be admitted to the hospital to rule out ACS.  Review of Systems:  Constitutional: negative for anorexia, fevers and sweats Eyes: negative for irritation, redness and visual disturbance Ears, nose, mouth, throat, and face: negative for earaches, epistaxis, nasal congestion and sore throat Respiratory: negative for cough, dyspnea on exertion, sputum and wheezing Cardiovascular: Positive for chest pain, palpitations and diaphoresis. Denies syncope and dyspnea Gastrointestinal: negative for abdominal pain, constipation, diarrhea, melena, nausea and vomiting Genitourinary:negative for dysuria, frequency and hematuria Hematologic/lymphatic: negative for bleeding, easy bruising and lymphadenopathy Musculoskeletal:negative for arthralgias, muscle weakness  and stiff joints Neurological: negative for coordination problems, gait problems, headaches and weakness Endocrine: negative for diabetic symptoms including polydipsia, polyuria and weight loss Allergic/Immunologic: negative for anaphylaxis, hay fever and urticaria   Past Medical History  Diagnosis Date  . Anemia   . Atrial fibrillation   . Headache   . Hypertension   . GERD (gastroesophageal reflux disease)   . Gastritis   . Fatty liver   . Diverticulosis   . Internal hemorrhoids   . Depression   . OSA (obstructive sleep apnea)   . Asthma   . Adenomatous colon polyp 05/2002  . Hemorrhoids    Past Surgical History  Procedure Date  . Partial hysterectomy   . Appendectomy   . Breast reduction surgery   . Total abdominal hysterectomy w/ bilateral salpingoophorectomy   . Cervical fusion    Social History:  reports that she has never smoked. She has never used smokeless tobacco. She reports that she does not drink alcohol or use illicit drugs. Lives at home with her husband, she manages finances for [employer]  No Known Allergies  Family History  Problem Relation Age of Onset  . Diabetes Mother   . Coronary artery disease Sister   . Hypertension Mother   . Hypertension Sister   . Colon cancer Neg Hx     Prior to Admission medications   Medication Sig Start Date End Date Taking? Authorizing Provider  albuterol (PROAIR HFA) 108 (90 BASE) MCG/ACT inhaler Inhale 2 puffs into the lungs every 4 (four) hours as needed. 01/01/12  Yes Kristian Covey, MD  amLODipine-valsartan (EXFORGE) 5-160 MG per tablet Take 1 tablet by mouth daily. 01/01/12  Yes Kristian Covey, MD  aspirin 81 MG tablet Take 81 mg by mouth daily.   Yes Historical Provider, MD  atenolol (TENORMIN) 25 MG tablet One every 12 hours as needed for palpitations 08/12/11  Yes Kristian Covey, MD  calcium carbonate (TUMS - DOSED IN MG ELEMENTAL CALCIUM) 500 MG chewable tablet Chew 2 tablets by mouth daily  as needed. For indigestion   Yes Historical Provider, MD  Calcium Carbonate-Vitamin D (CALCIUM 600+D) 600-400 MG-UNIT per tablet Take 1 tablet by mouth daily.     Yes Historical Provider, MD  estrogens conjugated, synthetic A, (CENESTIN) 0.625 MG tablet Take 0.625 mg by mouth daily.   Yes Historical Provider, MD  hydrochlorothiazide (MICROZIDE) 12.5 MG capsule Take 1 capsule (12.5 mg total) by mouth daily. 01/01/12  Yes Kristian Covey, MD  ibuprofen (ADVIL,MOTRIN) 200 MG tablet Take 400 mg by mouth every 6 (six) hours as needed. For pain   Yes Historical Provider, MD  Multiple Vitamins-Minerals (MULTIVITAL-M) TABS Take 1 tablet by mouth daily.     Yes Historical Provider, MD   Physical Exam: Filed Vitals:   01/28/12 1145 01/28/12 1351 01/28/12 1500 01/28/12 1601  BP: 155/84 143/83 143/75 138/78  Pulse: 64  72   Temp:      TempSrc:      Resp: 19 18  20   SpO2: 96% 99% 98% 100%   General appearance: alert, cooperative and no distress  Head: Normocephalic, without obvious abnormality, atraumatic  Eyes: conjunctivae/corneas clear. PERRL, EOM's intact. Fundi benign.  Nose: Nares normal. Septum midline. Mucosa normal. No drainage or sinus tenderness.  Throat: lips, mucosa, and tongue normal; teeth and gums normal  Neck: Supple, no masses, no cervical lymphadenopathy, no JVD appreciated, no meningeal signs Resp: clear to auscultation bilaterally  Chest wall: There is marked tenderness in the anterior and posterior left side of the chest wall. Cardio: regular rate and rhythm, S1, S2 normal, no murmur, click, rub or gallop  GI: soft, non-tender; bowel sounds normal; no masses, no organomegaly  Extremities: extremities normal, atraumatic, no cyanosis or edema  Skin: Skin color, texture, turgor normal. No rashes or lesions  Neurologic: Alert and oriented X 3, normal strength and tone. Normal symmetric reflexes. Normal coordination and gait   Labs on Admission:  Basic Metabolic Panel:  Lab  01/28/12 1004  NA 139  K 3.7  CL 102  CO2 24  GLUCOSE 94  BUN 8  CREATININE 0.68  CALCIUM 9.9  MG --  PHOS --   Liver Function Tests: No results found for this basename: AST:5,ALT:5,ALKPHOS:5,BILITOT:5,PROT:5,ALBUMIN:5 in the last 168 hours No results found for this basename: LIPASE:5,AMYLASE:5 in the last 168 hours No results found for this basename: AMMONIA:5 in the last 168 hours CBC:  Lab 01/28/12 1004  WBC 8.0  NEUTROABS --  HGB 14.4  HCT 41.5  MCV 96.3  PLT 321   Cardiac Enzymes: No results found for this basename: CKTOTAL:5,CKMB:5,CKMBINDEX:5,TROPONINI:5 in the last 168 hours  BNP (last 3 results) No results found for this basename: PROBNP:3 in the last 8760 hours CBG: No results found for this basename: GLUCAP:5 in the last 168 hours  Radiological Exams on Admission: Dg Chest 2 View  01/28/2012  *RADIOLOGY REPORT*  Clinical Data: Chest pain, left arm pain.  History of hypertension.  CHEST - 2 VIEW  Comparison: 08/08/2011  Findings: Mild central peribronchial thickening.  No confluent airspace opacities.  Heart is normal size.  No effusions.  No acute bony abnormality.  IMPRESSION: Mild central peribronchial thickening suggesting bronchitis.   Original Report Authenticated By: Charlett Nose, M.D.    Ct Angio Chest Pe W/cm &/  or Wo Cm  01/28/2012  *RADIOLOGY REPORT*  Clinical Data: Left-sided chest pain, shortness of breath.  CT ANGIOGRAPHY CHEST  Technique:  Multidetector CT imaging of the chest using the standard protocol during bolus administration of intravenous contrast. Multiplanar reconstructed images including MIPs were obtained and reviewed to evaluate the vascular anatomy.  Contrast: OMNIPAQUE IOHEXOL 350 MG/ML SOLN  Comparison: 07/18/2008  Findings: No filling defects in the pulmonary arteries to suggest pulmonary emboli.  Coronary artery calcifications noted in the proximal left anterior descending artery, similar to prior study. Heart is normal size. Aorta  is normal caliber. No mediastinal, hilar, or axillary adenopathy.  Visualized thyroid and chest wall soft tissues unremarkable.  Minimal bibasilar atelectasis in the lungs bilaterally.  No effusions. Imaging into the upper abdomen shows no acute findings.  IMPRESSION: No acute findings.  No evidence of pulmonary embolus.   Original Report Authenticated By: Charlett Nose, M.D.     EKG: Independently reviewed.   Assessment/Plan Principal Problem:  *Chest pain Active Problems:  OBESITY NOS  HYPERTENSION  FATTY LIVER DISEASE   Chest pain -Atypical chest pain, likely musculoskeletal in origin. -I will discontinue nitroglycerin as it is causing headache already. -Check 3 sets of cardiac enzymes, repeat 12-lead EKG in a.m. and obtain 2-D echocardiogram. -Check lipid profile, continue ASA and atenolol.  Hypertension -Patient is on for it , continue preadmission home medications.and atenolol. -Blood pressure seems to be controlled.   Obesity -Patient counseled about weight loss as it proved to help with the blood pressure control. Check TSH and A1c. -Patient describes classic picture of obstructive sleep apnea. -She has daytime sleepiness, snoring, palpitations and morning headaches . -I will highly recommend to obtain outpatient polysomnogram.   Fatty liver disease -Check a LFTs in a.m.  Atrial fibrillation -There was mention in the records about atrial fibrillation, patient denied any history of arrhythmias. -Her EKG showed normal sinus rhythm, even if she has atrial fibrillation her CHADS2 score will be on, she is on ASA.  Code Status:  full code  Family Communication:  plan discussed with patient Disposition Plan:  observation on telemetry  Time spent:  70 minutes   Gaberial Cada A [facility] Pager [phone]  If 7PM-7AM, please contact night-coverage [email or web address] Password [facility] 01/28/2012, 4:48 PM

## 2012-01-28 NOTE — ED Notes (Signed)
Pt returned from radiology.

## 2012-01-28 NOTE — ED Provider Notes (Addendum)
History     CSN: [id number]  Arrival date & time 01/28/12  3244   First MD Initiated Contact with Patient 01/28/12 1105      Chief Complaint  Patient presents with  . Chest Pain    (Consider location/radiation/quality/duration/timing/severity/associated sxs/prior treatment) HPI Comments: Pt with hx of afib, htn and on hormone tabs comes in with cc of chest pain. Pt's symptoms started just recently, and she has left sided chest pain, that is pressure like. Thep ain this am radiated to the left shoulder, and that got the patient concerned. Pt had some nausea, diophoreiss and sob. She also reports that her pain is worse mostly with deep inspiration. She has no hx of DVT, PE, no recent illnesses, cough. Pt has no cardiac hx, and has no provocative cardiac workup. Her social and family hx are unremarkable.   Patient is a 57 y.o. female presenting with chest pain. The history is provided by the patient.  Chest Pain Primary symptoms include shortness of breath and nausea. Pertinent negatives for primary symptoms include no cough, no wheezing, no abdominal pain and no vomiting.  Associated symptoms include diaphoresis.     Past Medical History  Diagnosis Date  . Anemia   . Atrial fibrillation   . Headache   . Hypertension   . GERD (gastroesophageal reflux disease)   . Gastritis   . Fatty liver   . Diverticulosis   . Internal hemorrhoids   . Depression   . OSA (obstructive sleep apnea)   . Asthma   . Adenomatous colon polyp 05/2002  . Hemorrhoids     Past Surgical History  Procedure Date  . Partial hysterectomy   . Appendectomy   . Breast reduction surgery   . Total abdominal hysterectomy w/ bilateral salpingoophorectomy   . Cervical fusion     Family History  Problem Relation Age of Onset  . Diabetes Mother   . Coronary artery disease Sister   . Hypertension Mother   . Hypertension Sister   . Colon cancer Neg Hx     History  Substance Use Topics  . Smoking  status: Never Smoker   . Smokeless tobacco: Never Used  . Alcohol Use: No    OB History    Grav Para Term Preterm Abortions TAB SAB Ect Mult Living                  Review of Systems  Constitutional: Positive for diaphoresis. Negative for activity change.  HENT: Negative for facial swelling and neck pain.   Respiratory: Positive for shortness of breath. Negative for cough and wheezing.   Cardiovascular: Positive for chest pain.  Gastrointestinal: Positive for nausea. Negative for vomiting, abdominal pain, diarrhea, constipation, blood in stool and abdominal distention.  Genitourinary: Negative for hematuria and difficulty urinating.  Skin: Negative for color change.  Neurological: Negative for speech difficulty.  Hematological: Does not bruise/bleed easily.  Psychiatric/Behavioral: Negative for confusion.    Allergies  Review of patient's allergies indicates no known allergies.  Home Medications   Current Outpatient Rx  Name  Route  Sig  Dispense  Refill  . ALBUTEROL SULFATE HFA 108 (90 BASE) MCG/ACT IN AERS   Inhalation   Inhale 2 puffs into the lungs every 4 (four) hours as needed.   1 Inhaler   1   . AMLODIPINE BESYLATE-VALSARTAN 5-160 MG PO TABS   Oral   Take 1 tablet by mouth daily.   30 tablet   0   . ASPIRIN  81 MG PO TABS   Oral   Take 81 mg by mouth daily.         . ATENOLOL 25 MG PO TABS      One every 12 hours as needed for palpitations   30 tablet   1   . CALCIUM CARBONATE ANTACID 500 MG PO CHEW   Oral   Chew 2 tablets by mouth daily as needed. For indigestion         . CALCIUM CARBONATE-VITAMIN D 600-400 MG-UNIT PO TABS   Oral   Take 1 tablet by mouth daily.           [name] ESTROGENS CONJ SYNTHETIC A 0.625 MG PO TABS   Oral   Take 0.625 mg by mouth daily.         [name] HYDROCHLOROTHIAZIDE 12.5 MG PO CAPS   Oral   Take 1 capsule (12.5 mg total) by mouth daily.   90 capsule   3   . IBUPROFEN 200 MG PO TABS   Oral   Take 400 mg by  mouth every 6 (six) hours as needed. For pain         . MULTIVITAL-M PO TABS   Oral   Take 1 tablet by mouth daily.             BP 155/84  Pulse 64  Temp 97.4 F (36.3 C) (Oral)  Resp 19  SpO2 96%  Physical Exam  Nursing note and vitals reviewed. Constitutional: She is oriented to person, place, and time. She appears well-developed.  HENT:  Head: Normocephalic and atraumatic.  Eyes: Conjunctivae normal and EOM are normal. Pupils are equal, round, and reactive to light.  Neck: Normal range of motion. Neck supple. No JVD present.  Cardiovascular: Normal rate, regular rhythm and normal heart sounds.   No murmur heard. Pulmonary/Chest: Effort normal and breath sounds normal. No respiratory distress.  Abdominal: Soft. Bowel sounds are normal. She exhibits no distension. There is no tenderness. There is no rebound and no guarding.  Neurological: She is alert and oriented to person, place, and time.  Skin: Skin is warm and dry.    ED Course  Procedures (including critical care time)   Labs Reviewed  CBC  BASIC METABOLIC PANEL  POCT I-STAT TROPONIN I   Dg Chest 2 View  01/28/2012  *RADIOLOGY REPORT*  Clinical Data: Chest pain, left arm pain.  History of hypertension.  CHEST - 2 VIEW  Comparison: 08/08/2011  Findings: Mild central peribronchial thickening.  No confluent airspace opacities.  Heart is normal size.  No effusions.  No acute bony abnormality.  IMPRESSION: Mild central peribronchial thickening suggesting bronchitis.   Original Report Authenticated By: Charlett Nose, M.D.      No diagnosis found.    MDM   Date: 01/28/2012  Rate:74  Rhythm: normal sinus rhythm  QRS Axis: normal  Intervals: normal  ST/T Wave abnormalities: nonspecific ST changes  Conduction Disutrbances:none  Narrative Interpretation:   Old EKG Reviewed: changes noted ST depression, inferior leads, < 1 mm, ST changes in avr < 1mm  Differential diagnosis includes: ACS syndrome CHF  exacerbation Valvular disorder Myocarditis Pericarditis Pericardial effusion Pneumonia Pleural effusion Pulmonary edema PE Anemia Musculoskeletal pain  Pt comes in with cc of chest pain. Left sided, pleuritic, and some typical features. She is on Hormonal tabs, Wells score for PE is 3, so we will get CT PE.  If PE is negative - she will stay in for ACS rule out. There  are some suttle ekg findings, so we will repeat EKG.  1:46 PM Chest pain has resolved, mild shoulder soreness only, repeat EKG pending. CT PE is neg.     Derwood Kaplan, MD 01/28/12 1346   Date: 01/28/2012  Rate:68  Rhythm: normal sinus rhythm  QRS Axis: normal  Intervals: normal  ST/T Wave abnormalities: nonspecific ST/T changes  Conduction Disutrbances:none  Narrative Interpretation:   Old EKG Reviewed: unchanged    Derwood Kaplan, MD 01/28/12 1422

## 2012-01-28 NOTE — ED Notes (Signed)
Pt transported to radiology.

## 2012-01-28 NOTE — ED Notes (Signed)
Lt. Side chest pain began Monday night felt like indigestion,  Radiates into her back and last night it radiated into her lt. Arm.  She is sob, pt . denie any n/v but has had periods of dizziness and sweating

## 2012-01-29 DIAGNOSIS — I059 Rheumatic mitral valve disease, unspecified: Secondary | ICD-10-CM

## 2012-01-29 LAB — TROPONIN I
Troponin I: <0.3 ng/mL (ref <0.30)
Troponin I: <0.3 ng/mL (ref <0.30)

## 2012-01-29 LAB — HEMOGLOBIN A1C
Hgb A1c MFr Bld: 5.8 % — ABNORMAL HIGH (ref <5.7)
Mean Plasma Glucose: 120 mg/dL — ABNORMAL HIGH (ref <117)

## 2012-01-29 LAB — LIPID PANEL
Cholesterol: 147 mg/dL (ref 0–200)
HDL: 56 mg/dL (ref >39)
LDL Cholesterol: 62 mg/dL (ref 0–99)
Total CHOL/HDL Ratio: 2.6 RATIO
Triglycerides: 146 mg/dL (ref <150)
VLDL: 29 mg/dL (ref 0–40)

## 2012-01-29 LAB — TSH: TSH: 1.469 u[IU]/mL (ref 0.350–4.500)

## 2012-01-29 MED ORDER — ZOLPIDEM TARTRATE 5 MG PO TABS: 5.0000 mg | ORAL_TABLET | Freq: Every evening | ORAL | Status: DC | PRN

## 2012-01-29 NOTE — Care Management Note (Signed)
    Page 1 of 1   01/29/2012     10:41:53 AM   CARE MANAGEMENT NOTE 01/29/2012  Patient:  RENETTA, SUMAN   Account Number:  [id number]  Date Initiated:  01/29/2012  Documentation initiated by:  GRAVES-BIGELOW,Dare Sanger  Subjective/Objective Assessment:   Pt admitted with cp.     Action/Plan:   No needs from CM at this time.   Anticipated DC Date:  01/29/2012   Anticipated DC Plan:  HOME/SELF CARE      DC Planning Services  CM consult      Choice offered to / List presented to:             Status of service:  Completed, signed off Medicare Important Message given?   (If response is "NO", the following Medicare IM given date fields will be blank) Date Medicare IM given:   Date Additional Medicare IM given:    Discharge Disposition:  HOME/SELF CARE  Per UR Regulation:  Reviewed for med. necessity/level of care/duration of stay  If discussed at Long Length of Stay Meetings, dates discussed:    Comments:

## 2012-01-29 NOTE — Progress Notes (Signed)
UR Completed Kalyani Maeda Graves-Bigelow, RN,BSN [phone]  

## 2012-01-29 NOTE — Progress Notes (Signed)
  Echocardiogram 2D Echocardiogram has been performed.  [name] 01/29/2012, 12:03 PM

## 2012-01-29 NOTE — Progress Notes (Signed)
Pt provided with dc instructions and education. Pt verbalized understanding. Pt aware of follow up appointments. Pt handed prescription for ambien and teachback how to take at home. VSS. IV removed with tip intact. Heart monitor cleaned and returned to front. Pt leaving with family for home. Levonne Spiller, RN

## 2012-01-29 NOTE — Progress Notes (Addendum)
[facility] PROGRESS NOTE  [name] ZOX:[id number] DOB: 08-10-1955 DOA: 01/28/2012 PCP: Kristian Covey, MD Cardiologist: Lewayne Bunting, MD  Assessment/Plan: 1. Atypical chest pain: History and exam highly suggest musculoskeletal etiology. Furthermore previous history as detailed below also suggest musculoskeletal etiology. The presence of constant chest pain for 3 days with negative cardiac markers is reassuring. Followup echocardiogram: EKG 1/23: Sinus rhythm, no acute changes. Nonspecific ST-T wave abnormality persists. This was also seen 08/2011. Cardiac catheterization 05/2010: Normal coronary angiography and left ventricular wall motion. Chest pain at that time felt to be noncardiac. Symptoms were noted to wax and wane especially when lying on left-sided that time. Also had numbness and tingling in her left arm at that time. 2. History of Paroxysmal Atrial fibrillation: By review of chart this was in the distant past and apparently has not recurred. Can followup in the outpatient setting, as no evidence during this hospitalization would not make any changes to her outpatient regimen at this point. 3. Hypertension: Stable. Continue Norvasc, atenolol, hydrochlorothiazide, Avapro. 4. Hormone replacement therapy: CT chest was negative.   Code Status: Full code Family Communication: Discussed with husband at bedside Disposition Plan: : Her today if echocardiogram unremarkable  Brendia Sacks, MD  [facility] Team 5 Pager [phone] If 7PM-7AM, please contact night-coverage at [email or web address], password [facility] 01/29/2012, 10:26 AM  LOS: 1 day   Brief narrative: 57 y.o. female with past medical history of hypertension, obesity and fatty liver disease. Patient came to the hospital because of chest pain. Patient started complaining about chest pain Monday night, she was sitting the chest pain comes and goes, left-sided, radiates to her left shoulder and her back. The pain was  pressure-like, she noticed the pain increases with movement of her right arm and when she sleeps on her left side. She does remember anything made the pain better, last night she has diaphoresis and she got nauseated so she reported to the hospital today after she called her primary care physician office. Upon initial evaluation in the emergency department for a set of cardiac enzymes negative, 12-lead EKG showed nonspecific T-wave changes with ST depression in the inferior leads which less than 1 mm. On my exam patient has totally reproducible chest and back pain suggesting muscular skeletal origin. Patient will be admitted to the hospital to rule out ACS.  Consultants:  None  Procedures:  2-D echocardiogram: Left ventricle: The cavity size was normal. Wall thickness was increased in a pattern of mild LVH. There was mild focal basal hypertrophy of the septum. Systolic function was normal. The estimated ejection fraction was in the range of 55% to 60%. Wall motion was normal; there were no regional wall motion abnormalities. Doppler parameters are consistent with abnormal left ventricular relaxation (grade 1 diastolic dysfunction).  HPI/Subjective: Afebrile, vital signs stable. Breathing well. No left arm neck or jaw pain. No diaphoresis. Still has some left-sided chest discomfort that improves with movement of arm and left upper back discomfort. This has been present constantly since 1/20. Did lift a heavy vacuum a little while ago prior to admission. Tends to sleep on her left side.  Objective: Filed Vitals:   01/28/12 1601 01/28/12 2100 01/29/12 0606 01/29/12 0939  BP: 138/78 149/89 143/89 148/81  Pulse:  73 66 73  Temp:  98.1 F (36.7 C) 98.1 F (36.7 C)   TempSrc:  Oral Oral   Resp: 20 16 16    Weight:   100.064 kg (220 lb 9.6 oz)   SpO2: 100% 96%  95%     Intake/Output Summary (Last 24 hours) at 01/29/12 1026 Last data filed at 01/28/12 2138  Gross per 24 hour  Intake      3 ml    Output      0 ml  Net      3 ml   Filed Weights   01/29/12 0606  Weight: 100.064 kg (220 lb 9.6 oz)    Exam:  General:  Appears calm and comfortable Cardiovascular: RRR, no m/r/g. No LE edema. Telemetry: SR, no arrhythmias  Respiratory: CTA bilaterally, no w/r/r. Normal respiratory effort. Musculoskeletal: Exquisite pain with palpation of the left upper chest over the origin of the left pectoralis muscle as well as the infrascapular region left side. Patient winces and moves involuntarily with palpation. Psychiatric: grossly normal mood and affect, speech fluent and appropriate  Data Reviewed: Basic Metabolic Panel:  Lab 01/28/12 1610  NA 139  K 3.7  CL 102  CO2 24  GLUCOSE 94  BUN 8  CREATININE 0.68  CALCIUM 9.9  MG --  PHOS --   CBC:  Lab 01/28/12 1004  WBC 8.0  NEUTROABS --  HGB 14.4  HCT 41.5  MCV 96.3  PLT 321   Cardiac Enzymes:  Lab 01/29/12 0628 01/29/12 0023 01/28/12 1837 01/28/12 1651  CKTOTAL -- -- -- --  CKMB -- -- -- --  CKMBINDEX -- -- -- --  TROPONINI <0.30 <0.30 <0.30 <0.30   Studies: Dg Chest 2 View  01/28/2012  *RADIOLOGY REPORT*  Clinical Data: Chest pain, left arm pain.  History of hypertension.  CHEST - 2 VIEW  Comparison: 08/08/2011  Findings: Mild central peribronchial thickening.  No confluent airspace opacities.  Heart is normal size.  No effusions.  No acute bony abnormality.  IMPRESSION: Mild central peribronchial thickening suggesting bronchitis.   Original Report Authenticated By: Charlett Nose, M.D.    Ct Angio Chest Pe W/cm &/or Wo Cm  01/28/2012  *RADIOLOGY REPORT*  Clinical Data: Left-sided chest pain, shortness of breath.  CT ANGIOGRAPHY CHEST  Technique:  Multidetector CT imaging of the chest using the standard protocol during bolus administration of intravenous contrast. Multiplanar reconstructed images including MIPs were obtained and reviewed to evaluate the vascular anatomy.  Contrast: OMNIPAQUE IOHEXOL 350 MG/ML SOLN   Comparison: 07/18/2008  Findings: No filling defects in the pulmonary arteries to suggest pulmonary emboli.  Coronary artery calcifications noted in the proximal left anterior descending artery, similar to prior study. Heart is normal size. Aorta is normal caliber. No mediastinal, hilar, or axillary adenopathy.  Visualized thyroid and chest wall soft tissues unremarkable.  Minimal bibasilar atelectasis in the lungs bilaterally.  No effusions. Imaging into the upper abdomen shows no acute findings.  IMPRESSION: No acute findings.  No evidence of pulmonary embolus.   Original Report Authenticated By: Charlett Nose, M.D.     Scheduled Meds:   . amLODipine  5 mg Oral Daily  . aspirin EC  81 mg Oral Daily  . atenolol  25 mg Oral Daily  . heparin  5,000 Units Subcutaneous Q8H  . hydrochlorothiazide  12.5 mg Oral Daily  . irbesartan  150 mg Oral Daily  . methocarbamol  500 mg Oral TID  . multivitamin with minerals  1 tablet Oral Daily  . sodium chloride  3 mL Intravenous Q12H   Continuous Infusions:   Principal Problem:  *Chest pain Active Problems:  OBESITY NOS  HYPERTENSION  FATTY LIVER DISEASE     Brendia Sacks, MD  [facility]  Team 5 Pager [phone] If 7PM-7AM, please contact night-coverage at [email or web address], password [facility] 01/29/2012, 10:26 AM  LOS: 1 day

## 2012-01-29 NOTE — Discharge Summary (Signed)
Physician Discharge Summary  [name]:[id number] DOB: 10-19-1955 DOA: 01/28/2012  PCP: Kristian Covey, MD  Admit date: 01/28/2012 Discharge date: 01/29/2012  Recommendations for Outpatient Follow-up:  1. Followup noncardiac chest pain as clinically indicated  Follow-up Information    Follow up with Kristian Covey, MD. In 1 week.   Contact information:   [address] [place] [address] [phone]         Discharge Diagnoses:  1. Atypical chest pain 2. History of Paroxysmal Atrial fibrillation 3. Hypertension  Discharge Condition: Improved Disposition: Home  Diet recommendation: Heart healthy  Filed Weights   01/29/12 0606  Weight: 100.064 kg (220 lb 9.6 oz)    History of present illness:  57 y.o. female with past medical history of hypertension, obesity and fatty liver disease. Patient came to the hospital because of chest pain. Patient started complaining about chest pain Monday night, she was sitting the chest pain comes and goes, left-sided, radiates to her left shoulder and her back. The pain was pressure-like, she noticed the pain increases with movement of her right arm and when she sleeps on her left side. She does remember anything made the pain better, last night she has diaphoresis and she got nauseated so she reported to the hospital today after she called her primary care physician office. Upon initial evaluation in the emergency department for a set of cardiac enzymes negative, 12-lead EKG showed nonspecific T-wave changes with ST depression in the inferior leads which less than 1 mm. On exam patient has totally reproducible chest and back pain suggesting muscular skeletal origin. Patient will be admitted to the hospital to rule out ACS.  Hospital Course:  Ms. Soltero was admitted for further evaluation of chest pain. Cardiac enzymes were negative and 2-D echocardiogram was unremarkable. History and exam are highly suggestive of muscular  etiology, with constant nature for 4 days with negative enzymes, exquisite pain with palpation left pectoral origin and left infrascapular area. Observed to wince with movement of her left arm as well. The patient tends to sleep on her left side and did lift a heavy object recently. Her pain is not exacerbated by ambulation, but by deep breath consistent with a muscular etiology.  Additionally she has had significant stress with the recent death of her sister and she also has a high-pressure job. She has been suffering from insomnia lately. Finally, she had a similar presentation in 2012 with a negative cardiac catheterization.  1. Atypical chest pain: History and exam highly suggestive of musculoskeletal etiology. Furthermore previous history as detailed below also suggest musculoskeletal etiology. The presence of constant chest pain for 3 days with negative cardiac markers is reassuring. Followup echocardiogram: EKG 1/23: Sinus rhythm, no acute changes. Nonspecific ST-T wave abnormality persists. This was also seen 08/2011. Cardiac catheterization 05/2010: Normal coronary angiography and left ventricular wall motion. Chest pain at that time felt to be noncardiac. Symptoms were noted to wax and wane especially when lying on left-sided that time. Also had numbness and tingling in her left arm at that time.  2. History of Paroxysmal Atrial fibrillation: By review of chart this was in the distant past and apparently has not recurred. Can followup in the outpatient setting, as no evidence during this hospitalization would not make any changes to her outpatient regimen at this point.  3. Hypertension: Stable. Continue Norvasc, atenolol, hydrochlorothiazide, Avapro.  4. Hormone replacement therapy: CT chest was negative.  Consultants:  None  Procedures:  2-D echocardiogram: Left ventricle:  The cavity size was normal. Wall thickness was increased in a pattern of mild LVH. There was mild focal basal hypertrophy  of the septum. Systolic function was normal. The estimated ejection fraction was in the range of 55% to 60%. Wall motion was normal; there were no regional wall motion abnormalities. Doppler parameters are consistent with abnormal left ventricular relaxation (grade 1 diastolic dysfunction).  Discharge Instructions  Discharge Orders    Future Orders Please Complete By Expires   Diet - low sodium heart healthy      Discharge instructions      Comments:   Continue your outpatient medications as instructed without change. Followup with your primary care physician in approximately one week. Your evaluation in the hospital for chest pain showed no evidence of damage to your heart. This is most likely muscular pain. You may ice or apply heat to affected areas. Expect muscular pain to resolve. Should your symptoms recur, worsen or fail to improve call your physician or seek immediate medical attention.   Activity as tolerated - No restrictions          Medication List     As of 01/29/2012  4:21 PM    TAKE these medications         albuterol 108 (90 BASE) MCG/ACT inhaler   Commonly known as: PROVENTIL HFA;VENTOLIN HFA   Inhale 2 puffs into the lungs every 4 (four) hours as needed.      amLODipine-valsartan 5-160 MG per tablet   Commonly known as: EXFORGE   Take 1 tablet by mouth daily.      aspirin 81 MG tablet   Take 81 mg by mouth daily.      atenolol 25 MG tablet   Commonly known as: TENORMIN   One every 12 hours as needed for palpitations      Calcium 600+D 600-400 MG-UNIT per tablet   Generic drug: Calcium Carbonate-Vitamin D   Take 1 tablet by mouth daily.      calcium carbonate 500 MG chewable tablet   Commonly known as: TUMS - dosed in mg elemental calcium   Chew 2 tablets by mouth daily as needed. For indigestion      estrogens conjugated (synthetic A) 0.625 MG tablet   Commonly known as: CENESTIN   Take 0.625 mg by mouth daily.      hydrochlorothiazide 12.5 MG capsule     Commonly known as: MICROZIDE   Take 1 capsule (12.5 mg total) by mouth daily.      ibuprofen 200 MG tablet   Commonly known as: ADVIL,MOTRIN   Take 400 mg by mouth every 6 (six) hours as needed. For pain      Multivital-M Tabs   Take 1 tablet by mouth daily.      zolpidem 5 MG tablet   Commonly known as: AMBIEN   Take 1 tablet (5 mg total) by mouth at bedtime as needed for sleep.          The results of significant diagnostics from this hospitalization (including imaging, microbiology, ancillary and laboratory) are listed below for reference.    Significant Diagnostic Studies: Dg Chest 2 View  01/28/2012  *RADIOLOGY REPORT*  Clinical Data: Chest pain, left arm pain.  History of hypertension.  CHEST - 2 VIEW  Comparison: 08/08/2011  Findings: Mild central peribronchial thickening.  No confluent airspace opacities.  Heart is normal size.  No effusions.  No acute bony abnormality.  IMPRESSION: Mild central peribronchial thickening suggesting bronchitis.   Original  Report Authenticated By: Charlett Nose, M.D.    Ct Angio Chest Pe W/cm &/or Wo Cm  01/28/2012  *RADIOLOGY REPORT*  Clinical Data: Left-sided chest pain, shortness of breath.  CT ANGIOGRAPHY CHEST  Technique:  Multidetector CT imaging of the chest using the standard protocol during bolus administration of intravenous contrast. Multiplanar reconstructed images including MIPs were obtained and reviewed to evaluate the vascular anatomy.  Contrast: OMNIPAQUE IOHEXOL 350 MG/ML SOLN  Comparison: 07/18/2008  Findings: No filling defects in the pulmonary arteries to suggest pulmonary emboli.  Coronary artery calcifications noted in the proximal left anterior descending artery, similar to prior study. Heart is normal size. Aorta is normal caliber. No mediastinal, hilar, or axillary adenopathy.  Visualized thyroid and chest wall soft tissues unremarkable.  Minimal bibasilar atelectasis in the lungs bilaterally.  No effusions. Imaging  into the upper abdomen shows no acute findings.  IMPRESSION: No acute findings.  No evidence of pulmonary embolus.   Original Report Authenticated By: Charlett Nose, M.D.       Labs: Basic Metabolic Panel:  Lab 01/28/12 1610  NA 139  K 3.7  CL 102  CO2 24  GLUCOSE 94  BUN 8  CREATININE 0.68  CALCIUM 9.9  MG --  PHOS --     Lab 01/28/12 1004  WBC 8.0  NEUTROABS --  HGB 14.4  HCT 41.5  MCV 96.3  PLT 321   Cardiac Enzymes:  Lab 01/29/12 0628 01/29/12 0023 01/28/12 1837 01/28/12 1651  CKTOTAL -- -- -- --  CKMB -- -- -- --  CKMBINDEX -- -- -- --  TROPONINI <0.30 <0.30 <0.30 <0.30    Principal Problem:  *Chest pain Active Problems:  OBESITY NOS  HYPERTENSION  FATTY LIVER DISEASE   Time coordinating discharge: 25 minutes  Signed:  Brendia Sacks, MD [facility] 01/29/2012, 4:06 PM

## 2012-02-06 ENCOUNTER — Ambulatory Visit (INDEPENDENT_AMBULATORY_CARE_PROVIDER_SITE_OTHER): Payer: 59 | Admitting: Family Medicine

## 2012-02-06 ENCOUNTER — Encounter: Payer: Self-pay | Admitting: Family Medicine

## 2012-02-06 VITALS — BP 110/60 | Temp 98.4°F | Wt 228.0 lb

## 2012-02-06 DIAGNOSIS — G4700 Insomnia, unspecified: Secondary | ICD-10-CM

## 2012-02-06 DIAGNOSIS — F4321 Adjustment disorder with depressed mood: Secondary | ICD-10-CM

## 2012-02-06 DIAGNOSIS — R079 Chest pain, unspecified: Secondary | ICD-10-CM

## 2012-02-06 MED ORDER — SERTRALINE HCL 50 MG PO TABS: 50.0000 mg | ORAL_TABLET | Freq: Every day | ORAL | Status: DC

## 2012-02-06 NOTE — Progress Notes (Signed)
  Subjective:    Patient ID: [name], female    DOB: 1955/11/07, 57 y.o.   MRN: [id number]  HPI  Hospital followup  Patient admitted 01/28/2012 and discharged 01/29/2012. She presented with chest pain which was left-sided radiating to left shoulder and back Quality was pressure like and associated with nausea and diaphoresis. EKG in emergency department revealed nonspecific T-wave changes inferior leads Patient did have reproducible musculoskeletal pain but was admitted for rule out MI  Cardiac enzymes negative. Echocardiogram unremarkable. Similar presentation 2012 with negative cardiac catheterization Patient also had CT chest negative for pulmonary embolus. No changes made in her chronic antihypertensive regimen.  Patient denies any active chest pain at this time. No recent shoulder injury. Does relate increased depressive symptoms in several weeks. Sister died back in 12-23-2022 from complications of heart valve surgery. They were very close. Patient's had progressive problems with insomnia and depressed mood. Decreased concentration. Anhedonia. No suicidal ideation. Very disrupted sleep.  No alcohol use. Denies significant caffeine use.  Past Medical History  Diagnosis Date  . Anemia   . Atrial fibrillation   . Headache   . Hypertension   . GERD (gastroesophageal reflux disease)   . Gastritis   . Fatty liver   . Diverticulosis   . Internal hemorrhoids   . Depression   . OSA (obstructive sleep apnea)   . Asthma   . Adenomatous colon polyp 05/2002  . Hemorrhoids    Past Surgical History  Procedure Date  . Partial hysterectomy   . Appendectomy   . Breast reduction surgery   . Total abdominal hysterectomy w/ bilateral salpingoophorectomy   . Cervical fusion     reports that she has never smoked. She has never used smokeless tobacco. She reports that she does not drink alcohol or use illicit drugs. family history includes Coronary artery disease in her sister;  Diabetes in her mother; and Hypertension in her mother and sister.  There is no history of Colon cancer. No Known Allergies    Review of Systems  Constitutional: Positive for fatigue. Negative for appetite change and unexpected weight change.  Respiratory: Negative for cough and shortness of breath.   Psychiatric/Behavioral: Positive for sleep disturbance, dysphoric mood and decreased concentration. Negative for suicidal ideas, confusion and agitation. The patient is nervous/anxious.        Objective:   Physical Exam  Constitutional: She is oriented to person, place, and time. She appears well-developed and well-nourished. No distress.  Neck: Neck supple. No thyromegaly present.  Cardiovascular: Normal rate and regular rhythm.   No murmur heard. Pulmonary/Chest: Effort normal and breath sounds normal. No respiratory distress. She has no wheezes. She has no rales.  Musculoskeletal: She exhibits no edema.  Neurological: She is alert and oriented to person, place, and time. No cranial nerve deficit.  Psychiatric: Judgment and thought content normal.          Assessment & Plan:  #1 recent chest pain with negative evaluation as above. Question stress related. No active GERD symptoms. #2 adjustment disorder with depressed mood. Progressive symptoms of depression over couple months. Start sertraline 50 mg daily. Reassess 3-4 weeks. Discussed possible counseling. #3 sleep disturbance likely related to #2. Sleep hygiene discussed

## 2012-02-06 NOTE — Patient Instructions (Addendum)

## 2012-02-12 ENCOUNTER — Other Ambulatory Visit: Payer: Self-pay | Admitting: Family Medicine

## 2012-02-16 ENCOUNTER — Other Ambulatory Visit (HOSPITAL_COMMUNITY): Payer: Self-pay | Admitting: Obstetrics and Gynecology

## 2012-02-16 DIAGNOSIS — Z1231 Encounter for screening mammogram for malignant neoplasm of breast: Secondary | ICD-10-CM

## 2012-02-26 ENCOUNTER — Ambulatory Visit (HOSPITAL_COMMUNITY)
Admission: RE | Admit: 2012-02-26 | Discharge: 2012-02-26 | Disposition: A | Discharge status: Home / Self Care | Payer: 59 | Source: Ambulatory Visit | Attending: Obstetrics and Gynecology | Admitting: Obstetrics and Gynecology

## 2012-02-26 DIAGNOSIS — Z1231 Encounter for screening mammogram for malignant neoplasm of breast: Secondary | ICD-10-CM

## 2012-03-05 ENCOUNTER — Ambulatory Visit: Payer: 59 | Admitting: Family Medicine

## 2012-03-11 ENCOUNTER — Ambulatory Visit: Payer: 59 | Admitting: Family Medicine

## 2012-03-15 ENCOUNTER — Ambulatory Visit: Payer: 59 | Admitting: Family Medicine

## 2012-03-22 ENCOUNTER — Encounter: Payer: Self-pay | Admitting: Family Medicine

## 2012-03-22 ENCOUNTER — Ambulatory Visit (INDEPENDENT_AMBULATORY_CARE_PROVIDER_SITE_OTHER): Payer: 59 | Admitting: Family Medicine

## 2012-03-22 VITALS — BP 130/78 | Temp 97.5°F | Wt 224.0 lb

## 2012-03-22 DIAGNOSIS — R002 Palpitations: Secondary | ICD-10-CM

## 2012-03-22 DIAGNOSIS — F4323 Adjustment disorder with mixed anxiety and depressed mood: Secondary | ICD-10-CM

## 2012-03-22 NOTE — Progress Notes (Signed)
  Subjective:    Patient ID: [name], female    DOB: May 16, 1955, 57 y.o.   MRN: [id number]  HPI Patient seen with chief complaint of palpitations this past Thursday Refer to prior note. She had recent admission for atypical chest pain. Evaluation including echocardiogram unremarkable. She did have some mild LVH. She had initial couple years ago with atypical chest pain Patient had admission couple years ago with coronary angiography unremarkable She takes atenolol 25 mg daily Blood pressures been well controlled  Recent stress in dealing with sister's death. Started sertraline but she never started She has been getting counseling and she thinks that is helping.  Episode Thursday described as "fast heart rate" but has not taken. Duration was approximately 10 hours. She notes an increase fatigue. No chest pain. She did have some mild dyspnea but no pleuritic pain. No recent caffeine use, decongestant use, albuterol use.  Past Medical History  Diagnosis Date  . Anemia   . Atrial fibrillation   . Headache   . Hypertension   . GERD (gastroesophageal reflux disease)   . Gastritis   . Fatty liver   . Diverticulosis   . Internal hemorrhoids   . Depression   . OSA (obstructive sleep apnea)   . Asthma   . Adenomatous colon polyp 05/2002  . Hemorrhoids    Past Surgical History  Procedure Laterality Date  . Partial hysterectomy    . Appendectomy    . Breast reduction surgery    . Total abdominal hysterectomy w/ bilateral salpingoophorectomy    . Cervical fusion      reports that she has never smoked. She has never used smokeless tobacco. She reports that she does not drink alcohol or use illicit drugs. family history includes Coronary artery disease in her sister; Diabetes in her mother; and Hypertension in her mother and sister.  There is no history of Colon cancer. No Known Allergies    Review of Systems  Constitutional: Negative for appetite change and unexpected  weight change.  Respiratory: Negative for cough and wheezing.   Cardiovascular: Positive for palpitations. Negative for chest pain and leg swelling.  Gastrointestinal: Negative for abdominal pain.  Endocrine: Negative for heat intolerance.  Neurological: Negative for dizziness and syncope.       Objective:   Physical Exam  Constitutional: She appears well-developed and well-nourished.  Neck: Neck supple. No thyromegaly present.  Cardiovascular: Normal rate and regular rhythm.  Exam reveals no gallop.   No murmur heard. Pulmonary/Chest: Effort normal and breath sounds normal. No respiratory distress. She has no wheezes. She has no rales.  Musculoskeletal: She exhibits no edema.          Assessment & Plan:  Recurrent palpitations. Patient requesting cardiology referral. We'll set up. Avoid Caffeine use. Avoid decongestant use. Continue low-dose atenolol and may need to consider titration. Adjustment disorder with depressed mood. Improved with counseling. Avoid medication at this time

## 2012-03-22 NOTE — Patient Instructions (Addendum)
We will call you regarding cardiology referral. If you have another episode of palpitations/fast heart rate, may take a second dose of atenolol. Avoid caffeine or any decongestants

## 2012-04-20 ENCOUNTER — Encounter: Payer: Self-pay | Admitting: Cardiovascular Disease

## 2012-04-20 ENCOUNTER — Ambulatory Visit (INDEPENDENT_AMBULATORY_CARE_PROVIDER_SITE_OTHER): Payer: 59 | Admitting: Cardiovascular Disease

## 2012-04-20 VITALS — BP 150/87 | HR 71 | Ht 65.0 in | Wt 226.0 lb

## 2012-04-20 DIAGNOSIS — R002 Palpitations: Secondary | ICD-10-CM

## 2012-04-20 MED ORDER — METOPROLOL TARTRATE 25 MG PO TABS: ORAL_TABLET | ORAL | Status: DC

## 2012-04-20 NOTE — Progress Notes (Signed)
HPI:   57 year old woman presenting for cardiac evaluation. The patient has had a cardiac palpitations. These occurred in January and she was evaluated in the emergency department. She had associated atypical chest pain and was ruled out for myocardial infarction overnight. She had serial cardiac enzymes and an echocardiogram. All of her studies were normal. She's been taking atenolol as needed for palpitations but she feels fatigued with this medication. Her last episode of palpitations was in February 2014. These occur generally at rest and are worse when she is lying on her left side. She's had no exertional symptoms. She's gone through menopause and has noted that her symptoms are much worse since that time. She has associated hot flashes. She denies shortness of breath, edema, orthopnea, or PND. She has not had lightheadedness or near syncope.  She's been under a lot of stress. She lost her sister to a prolonged cardiac illness last year. 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 She's also been very concerned about her own health especially in light of her sister's recent death.  The patient had a cardiac catheterization for chest pain about 4 years ago and this showed normal coronary arteries and normal left ventricular function. There is no family history of sudden cardiac death.  Outpatient Encounter Prescriptions as of 04/20/2012  Medication Sig Dispense Refill  . albuterol (PROAIR HFA) 108 (90 BASE) MCG/ACT inhaler Inhale 2 puffs into the lungs every 4 (four) hours as needed.  1 Inhaler  1  . aspirin 81 MG tablet Take 81 mg by mouth daily.      [name] atenolol (TENORMIN) 25 MG tablet One every 12 hours as needed for palpitations  30 tablet  1  . Calcium Carbonate-Vitamin D (CALCIUM 600+D) 600-400 MG-UNIT per tablet Take 1 tablet by mouth daily.        [name] estrogens conjugated, synthetic A, (CENESTIN) 0.625 MG tablet Take 0.625 mg by mouth as  needed. Per OB/GYN, Dr Cherly Hensen      . EXFORGE 5-160 MG per tablet TAKE 1 TABLET DAILY  90 tablet  3  . hydrochlorothiazide (MICROZIDE) 12.5 MG capsule Take 1 capsule (12.5 mg total) by mouth daily.  90 capsule  3  . meloxicam (MOBIC) 7.5 MG tablet Take 7.5 mg by mouth as needed. Per Dr Modesto Charon, ortho      . Multiple Vitamin (MULTIVITAMIN) tablet Take 1 tablet by mouth daily.      . [DISCONTINUED] sertraline (ZOLOFT) 50 MG tablet Take 1 tablet (50 mg total) by mouth daily.  30 tablet  3   No facility-administered encounter medications on file as of 04/20/2012.    Review of patient's allergies indicates no known allergies.  Past Medical History  Diagnosis Date  . Anemia   . Atrial fibrillation   . Headache   . Hypertension   . GERD (gastroesophageal reflux disease)   . Gastritis   . Fatty liver   . Diverticulosis   . Internal hemorrhoids   . Depression   . OSA (obstructive sleep apnea)   . Asthma   . Adenomatous colon polyp 05/2002  . Hemorrhoids     Past Surgical History  Procedure Laterality Date  . Partial hysterectomy    . Appendectomy    . Breast reduction surgery    . Total abdominal hysterectomy w/ bilateral salpingoophorectomy    . Cervical fusion      History   Social History  . Marital  Status: Married    Spouse Name: N/A    Number of Children: 2  . Years of Education: N/A   Occupational History  . [employer]   . ACCOUNTING    Social History Main Topics  . Smoking status: Never Smoker   . Smokeless tobacco: Never Used  . Alcohol Use: No  . Drug Use: No  . Sexually Active: Not on file   Other Topics Concern  . Not on file   Social History Narrative  . No narrative on file    Family History  Problem Relation Age of Onset  . Diabetes Mother   . Coronary artery disease Sister   . Hypertension Mother   . Hypertension Sister   . Colon cancer Neg Hx     ROS: General: no fevers/chills. Positive for hot flashes and night sweats Eyes: no blurry  vision, diplopia, or amaurosis ENT: no sore throat or hearing loss Resp: no cough, wheezing, or hemoptysis CV: See history of present illness GI: no abdominal pain, nausea, vomiting, diarrhea, or constipation GU: no dysuria, frequency, or hematuria Skin: no rash Neuro: no headache, numbness, tingling, or weakness of extremities Musculoskeletal: no joint pain or swelling Heme: no bleeding, DVT, or easy bruising Endo: no polydipsia or polyuria  BP 150/87  Pulse 71  Ht 5\' 5"  (1.651 m)  Wt 102.513 kg (226 lb)  BMI 37.61 kg/m2  PHYSICAL EXAM: Pt is alert and oriented, WD, WN, pleasant overweight woman in no distress. HEENT: normal Neck: JVP normal. Carotid upstrokes normal without bruits. No thyromegaly. Lungs: equal expansion, clear bilaterally CV: Apex is discrete and nondisplaced, RRR without murmur or gallop Abd: soft, NT, +BS, no bruit, no hepatosplenomegaly Back: no CVA tenderness Ext: no C/C/E        Femoral pulses 2+= without bruits        DP/PT pulses intact and = Skin: warm and dry without rash Neuro: CNII-XII intact             Strength intact = bilaterally  EKG:  Normal sinus rhythm 71 beats per minute, within normal limits.  Cardiac Cath 07/19/2008: RESULTS: Left main coronary artery: The left main coronary artery is  free of significant disease.  Left anterior descending artery: The left anterior descending artery  gave rise to diagonal branch and septal perforators which were free of  significant disease and the LA proper was free of significant disease.  The circumflex artery: The circumflex artery is a moderate-sized vessel  that gave rise to marginal and posterolateral branches. These and the  circumflex proper were free of significant disease.  The right coronary artery: The right coronary artery is a dominant  vessel that gave rise to conus branch, right ventricular branch,  posterior descending branch, and posterolateral branch. These vessels  were free  of significant disease.  The left ventriculogram: The left ventriculogram was performed in the  RAO projection showed vigorous wall motion with no areas of hypokinesis.  Estimated ejection fraction was 70%.  CONCLUSION: Normal coronary angiography and left ventricular wall  motion.   2D Echo: Left ventricle: The cavity size was normal. Wall thickness was increased in a pattern of mild LVH. There was mild focal basal hypertrophy of the septum. Systolic function was normal. The estimated ejection fraction was in the range of 55% to 60%. Wall motion was normal; there were no regional wall motion abnormalities. Doppler parameters are consistent with abnormal left ventricular relaxation (grade 1 diastolic dysfunction).  ------------------------------------------------------------ Aortic valve: Trileaflet;  mildly thickened leaflets. Mobility was not restricted. Doppler: Transvalvular velocity was within the normal range. There was no stenosis. No regurgitation.  ------------------------------------------------------------ Aorta: Aortic root: The aortic root was normal in size.  ------------------------------------------------------------ Mitral valve: Mildly thickened leaflets . Mobility was not restricted. Doppler: Transvalvular velocity was within the normal range. There was no evidence for stenosis. Mild regurgitation. Peak gradient: 2mm Hg (D).  ------------------------------------------------------------ Left atrium: The atrium was normal in size.  ------------------------------------------------------------ Right ventricle: The cavity size was normal. Systolic function was normal.  ------------------------------------------------------------ Pulmonic valve: Doppler: Transvalvular velocity was within the normal range. There was no evidence for stenosis.  ------------------------------------------------------------ Tricuspid valve: Structurally normal valve.  Doppler: Transvalvular velocity was within the normal range. Trivial regurgitation.  ------------------------------------------------------------ Right atrium: The atrium was normal in size.  ------------------------------------------------------------ Pericardium: There was no pericardial effusion.  ASSESSMENT AND PLAN: Cardiac palpitations. The patient's echocardiogram, cardiac cath data, and lab work was all reviewed. Her thyroid studies are normal. Her electrolytes have been within normal limits. She has no coronary artery disease, structural heart disease, or valvular disease.  I suspect her palpitations are related to a combination of stress and hormonal changes associated with menopause. She is quite bothered by them and I have written her a prescription for metoprolol 12.5 mg to be taken at dinnertime. Timing of this in the evening because her symptoms are primarily at night. If she does not tolerate metoprolol because of fatigue, she will use it on an as-needed basis only. We discussed avoidance of caffeine, initiation of regular exercise program, and the importance of good sleep. She will discontinue atenolol because of intolerance. I will see her back in 6 months to see how she is doing. She will contact us if palpitations become more frequent and we will consider Holter monitor or event recorder if symptoms are refractory to metoprolol.  Tonny Bollman 04/20/2012 11:03 AM

## 2012-04-20 NOTE — Patient Instructions (Addendum)
Your physician wants you to follow-up in:  6 months. You will receive a reminder letter in the mail two months in advance. If you don't receive a letter, please call our office to schedule the follow-up appointment.  Your physician has recommended you make the following change in your medication:  Stop atenolol.  Start metoprolol tartrate 12.5 mg by mouth daily at dinner. (this will be half of a 25 mg tablet)

## 2012-04-28 ENCOUNTER — Encounter: Payer: Self-pay | Admitting: Gastroenterology

## 2012-06-21 ENCOUNTER — Ambulatory Visit (INDEPENDENT_AMBULATORY_CARE_PROVIDER_SITE_OTHER): Payer: 59 | Admitting: Family Medicine

## 2012-06-21 ENCOUNTER — Encounter: Payer: Self-pay | Admitting: Family Medicine

## 2012-06-21 VITALS — BP 120/70 | Temp 98.3°F | Wt 228.0 lb

## 2012-06-21 DIAGNOSIS — M25551 Pain in right hip: Secondary | ICD-10-CM

## 2012-06-21 DIAGNOSIS — M545 Low back pain, unspecified: Secondary | ICD-10-CM

## 2012-06-21 DIAGNOSIS — M25559 Pain in unspecified hip: Secondary | ICD-10-CM

## 2012-06-21 LAB — SEDIMENTATION RATE: Sed Rate: 26 mm/hr — ABNORMAL HIGH (ref 0–22)

## 2012-06-21 MED ORDER — MELOXICAM 15 MG PO TABS: 15.0000 mg | ORAL_TABLET | Freq: Every day | ORAL | Status: DC

## 2012-06-21 NOTE — Progress Notes (Signed)
Subjective:    Patient ID: [name], female    DOB: January 31, 1955, 57 y.o.   MRN: [id number]  HPI Patient seen with bilateral hip pain and lumbar pain. Onset about 3 weeks ago relatively suddenly. Denies injury. She has seen orthopedist and apparently had some lumbar injections(?steroid) couple weeks ago which did not help. She thinks if anything her pain is worse since then. She was prescribed hydrocodone which did not help and caused nausea. She has taken some Aleve without relief.  Her pain is lower lumbar area and radiates somewhat bilateral buttocks and hips. Worse with sitting or position change. Not worse with ambulation. Denies any lower extremity numbness or weakness. She's had some intermittent shoulder pains left greater than right but no other arthralgias. Denies recent fever, chills, skin rashes, weight changes. No dysuria. No urine or stool incontinence.  Chronic problems include hypertension and asthma. Both are well controlled.  Past Medical History  Diagnosis Date  . Anemia   . Atrial fibrillation   . Headache(784.0)   . Hypertension   . GERD (gastroesophageal reflux disease)   . Gastritis   . Fatty liver   . Diverticulosis   . Internal hemorrhoids   . Depression   . OSA (obstructive sleep apnea)   . Asthma   . Adenomatous colon polyp 05/2002  . Hemorrhoids    Past Surgical History  Procedure Laterality Date  . Partial hysterectomy    . Appendectomy    . Breast reduction surgery    . Total abdominal hysterectomy w/ bilateral salpingoophorectomy    . Cervical fusion      reports that she has never smoked. She has never used smokeless tobacco. She reports that she does not drink alcohol or use illicit drugs. family history includes Coronary artery disease in her sister; Diabetes in her mother; and Hypertension in her mother and sister.  There is no history of Colon cancer. Allergies  Allergen Reactions  . Hydrocodone     GI upset, headache  . Tramadol      GI upset, "I felt like I was in a fog"      Review of Systems  Constitutional: Negative for fever, chills, appetite change and unexpected weight change.  Respiratory: Negative for shortness of breath.   Cardiovascular: Negative for chest pain.  Gastrointestinal: Negative for abdominal pain.  Endocrine: Negative for polydipsia and polyuria.  Genitourinary: Negative for dysuria.  Musculoskeletal: Positive for back pain and arthralgias. Negative for myalgias, joint swelling and gait problem.  Skin: Negative for rash.  Hematological: Negative for adenopathy.       Objective:   Physical Exam  Constitutional: She appears well-developed and well-nourished.  Cardiovascular: Normal rate and regular rhythm.   Pulmonary/Chest: Effort normal and breath sounds normal. No respiratory distress. She has no wheezes. She has no rales.  Musculoskeletal: She exhibits no edema.  Patient has good range of motion of both hips. No localized tenderness. She has some nonspecific tenderness lower lumbar area bilaterally. She is very slow in changing positions from sitting in chair to table secondary to pain. Straight leg raises are negative  Neurological:  Symmetric reflexes lower extremities. Strength testing is difficult because of her complaints of pain. No focal weakness noted though          Assessment & Plan:  Patient presents with relatively acute lumbar back pain with bilateral hip pain. Relatively nonfocal exam. Her symptoms do not suggest likely lumbar stenosis as her pain is actually somewhat improved with walking.  Start meloxicam 15 mg once daily. Check ANA, sedimentation rate, and rheumatoid factor given the relatively sudden onset of arthralgias though clinical suspicion of rheumatoid arthritis/inflammatory arthritis is low.

## 2012-06-22 LAB — RHEUMATOID FACTOR: Rhuematoid fact SerPl-aCnc: <10 IU/mL (ref <=14)

## 2012-06-22 LAB — ANA: Anti Nuclear Antibody(ANA): NEGATIVE

## 2012-06-23 NOTE — Progress Notes (Signed)
Quick Note:  Pt informed all labs in normal range. Pt still in pain, requesting referral to ortho if Dr Caryl Never agrees. Best number to reach pt is at work [phone] ______

## 2012-06-24 ENCOUNTER — Other Ambulatory Visit: Payer: Self-pay | Admitting: *Deleted

## 2012-06-24 DIAGNOSIS — M25551 Pain in right hip: Secondary | ICD-10-CM

## 2012-06-24 NOTE — Progress Notes (Signed)
Quick Note:  Pt informed, she has no preference, agrees to use [facility] ______

## 2012-07-01 ENCOUNTER — Encounter: Payer: 59 | Admitting: Gastroenterology

## 2012-08-03 ENCOUNTER — Emergency Department (HOSPITAL_COMMUNITY)
Admission: EM | Admit: 2012-08-03 | Discharge: 2012-08-03 | Disposition: A | Discharge status: Home / Self Care | Payer: 59 | Attending: Emergency Medicine | Admitting: Emergency Medicine

## 2012-08-03 ENCOUNTER — Emergency Department (HOSPITAL_COMMUNITY): Payer: 59

## 2012-08-03 ENCOUNTER — Encounter (HOSPITAL_COMMUNITY): Payer: Self-pay | Admitting: Emergency Medicine

## 2012-08-03 DIAGNOSIS — Z862 Personal history of diseases of the blood and blood-forming organs and certain disorders involving the immune mechanism: Secondary | ICD-10-CM | POA: Insufficient documentation

## 2012-08-03 DIAGNOSIS — M549 Dorsalgia, unspecified: Secondary | ICD-10-CM

## 2012-08-03 DIAGNOSIS — M545 Low back pain, unspecified: Secondary | ICD-10-CM | POA: Insufficient documentation

## 2012-08-03 DIAGNOSIS — Z8719 Personal history of other diseases of the digestive system: Secondary | ICD-10-CM | POA: Insufficient documentation

## 2012-08-03 DIAGNOSIS — Z79899 Other long term (current) drug therapy: Secondary | ICD-10-CM | POA: Insufficient documentation

## 2012-08-03 DIAGNOSIS — Z7982 Long term (current) use of aspirin: Secondary | ICD-10-CM | POA: Insufficient documentation

## 2012-08-03 DIAGNOSIS — J45909 Unspecified asthma, uncomplicated: Secondary | ICD-10-CM | POA: Insufficient documentation

## 2012-08-03 DIAGNOSIS — M1909 Primary osteoarthritis, other specified site: Secondary | ICD-10-CM | POA: Insufficient documentation

## 2012-08-03 DIAGNOSIS — I10 Essential (primary) hypertension: Secondary | ICD-10-CM | POA: Insufficient documentation

## 2012-08-03 DIAGNOSIS — I4891 Unspecified atrial fibrillation: Secondary | ICD-10-CM | POA: Insufficient documentation

## 2012-08-03 HISTORY — DX: Dorsalgia, unspecified: M54.9

## 2012-08-03 MED ORDER — ONDANSETRON 4 MG PO TBDP
4.0000 mg | ORAL_TABLET | Freq: Once | ORAL | Status: AC
  Administered 2012-08-03: 4 mg via ORAL

## 2012-08-03 MED ORDER — HYDROMORPHONE HCL PF 1 MG/ML IJ SOLN
1.0000 mg | Freq: Once | INTRAMUSCULAR | Status: AC
  Administered 2012-08-03: 1 mg via INTRAVENOUS
  Filled 2012-08-03: qty 1

## 2012-08-03 MED ORDER — ONDANSETRON 4 MG PO TBDP
ORAL_TABLET | ORAL | Status: AC
  Filled 2012-08-03: qty 1

## 2012-08-03 MED ORDER — HYDROCODONE-ACETAMINOPHEN 5-325 MG PO TABS: 1.0000 | ORAL_TABLET | Freq: Four times a day (QID) | ORAL | Status: DC | PRN

## 2012-08-03 NOTE — ED Notes (Signed)
Pt c/o lower back pain with radiation down bilateral legs x 2 weeks; pt sts seen PCP and given pain meds with no improvement

## 2012-08-03 NOTE — ED Notes (Signed)
Upon dc patient stood up to get dressed. Then became nauseous and began dry heaving. Denies abdominal pain. Denies dizziness. Notified MD by Corrie Dandy, RN and Zofran ordered and given. Returned to stretcher to rest.

## 2012-08-03 NOTE — ED Notes (Signed)
Patient transported to X-ray 

## 2012-08-03 NOTE — ED Provider Notes (Signed)
CSN: [id number]     Arrival date & time 08/03/12  4098 History     First MD Initiated Contact with Patient 08/03/12 [phone]     Chief Complaint  Patient presents with  . Back Pain   (Consider location/radiation/quality/duration/timing/severity/associated sxs/prior Treatment) The history is provided by the patient.   [name] 57 y.o. with a history of osteoarthritis of the lumbar spine he's been having ongoing back pain for the past 2 months. She presents today for worsening back pain and now is noted new left-sided hip pain. She's been followed by her PCP for the back pain and has had minimal control of NSAIDs and narcotics. She was seen by [facility] and had an MRI approximately 3 weeks ago. This MRI is unavailable the system, but she indicated that there was no evidence of impingement on her spinal cord. MRI only noted degenerative changes in her L. spine, and "a small fracture in my right hip". Pain is been worsening since it started. Pain is located in the lumbar spine. It's severe in severity. Sharp in character, worsened with standing or bending. It radiates down the bilateral posterior thighs. She is now noted in the past 2 weeks some weakness and numbness in the left lower extremity. She denies bowel or bladder incontinence. She reportedly has an appointment with orthopedics spine this afternoon at 1:30.  Past Medical History  Diagnosis Date  . Anemia   . Atrial fibrillation   . Headache(784.0)   . Hypertension   . GERD (gastroesophageal reflux disease)   . Gastritis   . Fatty liver   . Diverticulosis   . Internal hemorrhoids   . Depression   . OSA (obstructive sleep apnea)   . Asthma   . Adenomatous colon polyp 05/2002  . Hemorrhoids    Past Surgical History  Procedure Laterality Date  . Partial hysterectomy    . Appendectomy    . Breast reduction surgery    . Total abdominal hysterectomy w/ bilateral salpingoophorectomy    . Cervical fusion      Family History  Problem Relation Age of Onset  . Diabetes Mother   . Coronary artery disease Sister   . Hypertension Mother   . Hypertension Sister   . Colon cancer Neg Hx    History  Substance Use Topics  . Smoking status: Never Smoker   . Smokeless tobacco: Never Used  . Alcohol Use: No   OB History   Grav Para Term Preterm Abortions TAB SAB Ect Mult Living                 Review of Systems  Constitutional: Negative for fever, chills, diaphoresis, activity change and appetite change.  HENT: Negative for sore throat, rhinorrhea, sneezing, drooling and trouble swallowing.   Eyes: Negative for discharge and redness.  Respiratory: Negative for cough, chest tightness, shortness of breath, wheezing and stridor.   Cardiovascular: Negative for chest pain and leg swelling.  Gastrointestinal: Negative for nausea, vomiting, abdominal pain, diarrhea, constipation and blood in stool.  Genitourinary: Negative for difficulty urinating.  Musculoskeletal: Positive for back pain and arthralgias (Bilateral hip). Negative for myalgias.  Skin: Negative for pallor.  Neurological: Positive for weakness. Negative for dizziness, syncope, speech difficulty, light-headedness and headaches.  Hematological: Negative for adenopathy. Does not bruise/bleed easily.  Psychiatric/Behavioral: Negative for confusion and agitation.    Allergies  Tramadol  Home Medications   Current Outpatient Rx  Name  Route  Sig  Dispense  Refill  .  amLODipine-valsartan (EXFORGE) 5-160 MG per tablet   Oral   Take 1 tablet by mouth daily.         [name] aspirin 81 MG tablet   Oral   Take 81 mg by mouth daily.         . Calcium Carbonate-Vitamin D (CALCIUM 600+D) 600-400 MG-UNIT per tablet   Oral   Take 1 tablet by mouth daily.           [name] estradiol (VIVELLE-DOT) 0.05 MG/24HR   Transdermal   Place 1 patch onto the skin 2 (two) times a week. Apply Mondays and Thursdays         . hydrochlorothiazide  (MICROZIDE) 12.5 MG capsule   Oral   Take 1 capsule (12.5 mg total) by mouth daily.   90 capsule   3   . Multiple Vitamin (MULTIVITAMIN) tablet   Oral   Take 1 tablet by mouth daily.         [name] OVER THE COUNTER MEDICATION   Topical   Apply 1 application topically daily as needed (Back pain). Ointment similar to [facility]         . albuterol (PROAIR HFA) 108 (90 BASE) MCG/ACT inhaler   Inhalation   Inhale 2 puffs into the lungs every 4 (four) hours as needed.   1 Inhaler   1   . HYDROcodone-acetaminophen (NORCO/VICODIN) 5-325 MG per tablet   Oral   Take 1 tablet by mouth every 6 (six) hours as needed for pain.   10 tablet   0    BP 144/78  Pulse 66  Temp(Src) 98.1 F (36.7 C)  Resp 22  SpO2 97% Physical Exam  Constitutional: She is oriented to person, place, and time. She appears well-developed and well-nourished. She appears distressed (Due to the).  HENT:  Head: Normocephalic and atraumatic.  Right Ear: External ear normal.  Left Ear: External ear normal.  Eyes: Conjunctivae and EOM are normal. Right eye exhibits no discharge. Left eye exhibits no discharge.  Neck: Normal range of motion. Neck supple. No JVD present.  Cardiovascular: Normal rate, regular rhythm and normal heart sounds.  Exam reveals no gallop and no friction rub.   No murmur heard. Pulmonary/Chest: Effort normal and breath sounds normal. No stridor. No respiratory distress. She has no wheezes. She has no rales. She exhibits no tenderness.  Abdominal: Soft. Bowel sounds are normal. She exhibits no distension. There is no tenderness. There is no rebound and no guarding.  Musculoskeletal: Normal range of motion. She exhibits no edema.       Right hip: She exhibits tenderness.       Left hip: She exhibits bony tenderness (Greater trochanter).       Lumbar back: She exhibits bony tenderness (Midline mid lumbar spine). She exhibits no deformity, no laceration and no spasm.  Active range of motion intact  in the bilateral hips  Neurological: She is alert and oriented to person, place, and time. GCS eye subscore is 4. GCS verbal subscore is 5. GCS motor subscore is 6. She displays no Babinski's sign on the right side. She displays no Babinski's sign on the left side.  No ankle clonus bilaterally  Skin: Skin is warm. No rash noted. She is not diaphoretic.  Psychiatric: She has a normal mood and affect. Her behavior is normal.    ED Course   Procedures (including critical care time)  Labs Reviewed - No data to display Dg Lumbar Spine Complete  08/03/2012   *  RADIOLOGY REPORT*  Clinical Data: The patient states that she woke up this morning and was unable to move the legs.  Posterior bilateral hip pain and low back pain.  No known injuries.  LUMBAR SPINE - COMPLETE 4+ VIEW  Comparison: Lumbar spine x-rays 08/06/2009.  Bone window images from CT abdomen and pelvis 10/20/2006.  Findings: Five non-rib bearing lumbar vertebrae with anatomic alignment.  No fractures.  Well-preserved disc spaces.  Severe facet degenerative changes from L3-4 through L5-S1 bilaterally, worst at L5-S1, not significantly changed.  IMPRESSION: No acute osseous abnormality.  Facet degenerative changes bilaterally from L3-4 through L5-S1, worst at L5-S1, not significantly changed since 2011.   Original Report Authenticated By: Hulan Saas, M.D.   Dg Pelvis 1-2 Views  08/03/2012   *RADIOLOGY REPORT*  Clinical Data: The patient states that she woke up this morning and was unable to move the legs.  Posterior bilateral hip pain and low back pain.  No known injuries.  PELVIS - 1-2 VIEW  Comparison: Bone window images from CT pelvis 10/20/2006 and visualized pelvis or acute abdomen series 06/11/2007.  Findings: No evidence of acute or subacute fracture.  Hip joints intact with well-preserved joint spaces.  Well-preserved bone mineral density.  Sacroiliac joints and symphysis pubis intact.  IMPRESSION: Normal pelvis.   Original Report  Authenticated By: Hulan Saas, M.D.   1. Back pain     MDM  [name] 57 y.o. presents with ongoing lower back pain. Vision has a 2 month history of back pain which has been worsening and has been seen by [facility] where she had an MRI 3 weeks ago and showed no evidence of neurologic compromise. She is ongoing symptoms have worsened with now left hip pain. No injury no trauma noted. She is afebrile vital signs are stable. She appears to be distressed due to pain. Lower midline tenderness on exam. No focal deficits on exam. No ankle clonus bilaterally. Babinski negative bilaterally. No bowel or bladder incontinence. Will obtain plain films of patient's pelvis for Sided hip pain and vague description of possibly a right hip fracture. For her symptoms and reevaluate.  Symptoms improved intramuscular dilaudid. No evidence of acute hip fracture or malalignment. No evidence of acute L-spine changes . Previous imaging studies. Patient has an appointment this afternoon with an orthopedic spine surgeon. Considering there is no red flags associated with this patient's symptoms, appropriate for patient to followup with orthopedic spine surgeon this afternoon as planned. She was given prescription for pain medicine. She is instructed to followup as planned. She was given strong return precautions.  Imaging, and previous imaging/documentation reviewed. I discussed this patient's care with my attending.  Imaging reviewed. I discussed this patient's care with my attending, Dr. Radford Pax.  Sena Hitch, MD 08/03/12 1406

## 2012-08-04 NOTE — ED Provider Notes (Signed)
I saw and evaluated the patient, reviewed the resident's note and I agree with the findings and plan.   .Face to face Exam:  General:  Awake HEENT:  Atraumatic Resp:  Normal effort Abd:  Nondistended Neuro:No focal weakness Lymph: No adenopathy I saw and evaluated the patient, reviewed the resident's note and I agree with the findings and plan.   .Face to face Exam:  General:  Awake HEENT:  Atraumatic Resp:  Normal effort Abd:  Nondistended Neuro:No focal weakness  Nelia Shi, MD 08/04/12 [id number]

## 2012-08-16 ENCOUNTER — Other Ambulatory Visit: Payer: Self-pay | Admitting: Orthopedic Surgery

## 2012-08-16 DIAGNOSIS — M1990 Unspecified osteoarthritis, unspecified site: Secondary | ICD-10-CM

## 2012-08-18 ENCOUNTER — Ambulatory Visit
Admission: RE | Admit: 2012-08-18 | Discharge: 2012-08-18 | Disposition: A | Discharge status: Home / Self Care | Payer: 59 | Source: Ambulatory Visit | Attending: Orthopedic Surgery | Admitting: Orthopedic Surgery

## 2012-08-18 DIAGNOSIS — M1990 Unspecified osteoarthritis, unspecified site: Secondary | ICD-10-CM

## 2012-08-18 MED ORDER — IOHEXOL 180 MG/ML  SOLN
1.0000 mL | Freq: Once | INTRAMUSCULAR | Status: AC | PRN
Start: 2012-08-18 — End: 2012-08-18
  Administered 2012-08-18: 1 mL via INTRA_ARTICULAR

## 2012-08-18 MED ORDER — METHYLPREDNISOLONE ACETATE 40 MG/ML INJ SUSP (RADIOLOG
120.0000 mg | Freq: Once | INTRAMUSCULAR | Status: AC
  Administered 2012-08-18: 120 mg via INTRA_ARTICULAR

## 2012-10-21 ENCOUNTER — Ambulatory Visit (INDEPENDENT_AMBULATORY_CARE_PROVIDER_SITE_OTHER): Payer: 59 | Admitting: Gastroenterology

## 2012-10-21 ENCOUNTER — Encounter: Payer: Self-pay | Admitting: Gastroenterology

## 2012-10-21 VITALS — BP 122/82 | HR 96 | Ht 64.75 in | Wt 229.0 lb

## 2012-10-21 DIAGNOSIS — K921 Melena: Secondary | ICD-10-CM

## 2012-10-21 DIAGNOSIS — R198 Other specified symptoms and signs involving the digestive system and abdomen: Secondary | ICD-10-CM

## 2012-10-21 DIAGNOSIS — Z86010 Personal history of colonic polyps: Secondary | ICD-10-CM

## 2012-10-21 MED ORDER — PEG-KCL-NACL-NASULF-NA ASC-C 100 G PO SOLR: 1.0000 | Freq: Once | ORAL | Status: DC

## 2012-10-21 NOTE — Progress Notes (Signed)
    History of Present Illness: This is a 57 year old female who relates a change in bowel habits it appears to be related to pain medications used for back pain. She was treated with tramadol oxycodone and hydrocodone at various times of the past few months. She noted episodes of constipation associated with postprandial abdominal bloating and the symptoms resolved with the use of laxative. Following laxative usage she did have some looser stools and noted a small amount of red blood with bowel movements 3 occasions. The past week she's had no GI complaints.  Current Medications, Allergies, Past Medical History, Past Surgical History, Family History and Social History were reviewed in [employer] record.  Physical Exam: General: Well developed , well nourished, no acute distress Head: Normocephalic and atraumatic Eyes:  sclerae anicteric, EOMI Ears: Normal auditory acuity Mouth: No deformity or lesions Lungs: Clear throughout to auscultation Heart: Regular rate and rhythm; no murmurs, rubs or bruits Abdomen: Soft, non tender and non distended. No masses, hepatosplenomegaly or hernias noted. Normal Bowel sounds Rectal: Deferred to colonoscopy Musculoskeletal: Symmetrical with no gross deformities  Pulses:  Normal pulses noted Extremities: No clubbing, cyanosis, edema or deformities noted Neurological: Alert oriented x 4, grossly nonfocal Psychological:  Alert and cooperative. Normal mood and affect  Assessment and Recommendations:  1. Personal history of adenomatous colon polyps. She is due for a five-year interval surveillance colonoscopy. The risks, benefits, and alternatives to colonoscopy with possible biopsy and possible polypectomy were discussed with the patient and they consent to proceed.   2. Change in bowel habits has now resolved following discontinuation of pain medications.  3. Small volume hematochezia related to change in bowel habits. Suspect  hemorrhoidal bleeding. Further evaluation at colonoscopy.

## 2012-10-21 NOTE — Patient Instructions (Signed)
You have been scheduled for a colonoscopy with propofol. Please follow written instructions given to you at your visit today.  Please pick up your prep kit at the pharmacy within the next 1-3 days.   Thank you for choosing me and [facility].  Malcolm T. Stark, Jr., MD., FACG  

## 2012-10-22 ENCOUNTER — Encounter: Payer: Self-pay | Admitting: Gastroenterology

## 2012-12-09 ENCOUNTER — Ambulatory Visit (AMBULATORY_SURGERY_CENTER): Payer: 59 | Admitting: Gastroenterology

## 2012-12-09 ENCOUNTER — Encounter: Payer: Self-pay | Admitting: Gastroenterology

## 2012-12-09 VITALS — BP 121/75 | HR 62 | Temp 98.8°F | Resp 17 | Ht 64.0 in | Wt 229.0 lb

## 2012-12-09 DIAGNOSIS — D126 Benign neoplasm of colon, unspecified: Secondary | ICD-10-CM

## 2012-12-09 DIAGNOSIS — Z86010 Personal history of colonic polyps: Secondary | ICD-10-CM

## 2012-12-09 DIAGNOSIS — D175 Benign lipomatous neoplasm of intra-abdominal organs: Secondary | ICD-10-CM

## 2012-12-09 MED ORDER — SODIUM CHLORIDE 0.9 % IV SOLN: 500.0000 mL | INTRAVENOUS | Status: DC

## 2012-12-09 NOTE — Progress Notes (Signed)
Called to room to assist during endoscopic procedure.  Patient ID and intended procedure confirmed with present staff. Received instructions for my participation in the procedure from the performing physician.  

## 2012-12-09 NOTE — Op Note (Signed)
[facility] [address] [place], [address]   COLONOSCOPY PROCEDURE REPORT  PATIENT: [name], [name]  MR#: [id number] BIRTHDATE: 11-May-1955 , 57  yrs. old GENDER: Female ENDOSCOPIST: Meryl Dare, MD, [facility] PROCEDURE DATE:  12/09/2012 PROCEDURE:   Colonoscopy with biopsy First Screening Colonoscopy - Avg.  risk and is 50 yrs.  old or older - No.  Prior Negative Screening - Now for repeat screening. N/A  History of Adenoma - Now for follow-up colonoscopy & has been > or = to 3 yrs.  Yes hx of adenoma.  Has been 3 or more years since last colonoscopy.  Polyps Removed Today? No.  Recommend repeat exam, <10 yrs? Yes.  High risk (family or personal hx). ASA CLASS:   Class II INDICATIONS:Patient's personal history of adenomatous colon polyps.  MEDICATIONS: MAC sedation, administered by CRNA and propofol (Diprivan) 280mg  IV DESCRIPTION OF PROCEDURE:   After the risks benefits and alternatives of the procedure were thoroughly explained, informed consent was obtained.  A digital rectal exam revealed no abnormalities of the rectum.   The LB [id number]-H190 [id number]  endoscope was introduced through the anus and advanced to the cecum, which was identified by both the appendix and ileocecal valve. No adverse events experienced.   The quality of the prep was excellent, using MoviPrep  The instrument was then slowly withdrawn as the colon was fully examined.  COLON FINDINGS: Small lipoma, 1 cm, soft with a pillow sign, was found at the hepatic flexure.  A biopsy of the lesion was performed.   Mild diverticulosis was noted in the transverse colon, descending colon, and sigmoid colon.  The colon was otherwise normal.  There was no diverticulosis, inflammation, polyps or cancers unless previously stated. Retroflexed views revealed small internal hemorrhoids. The time to cecum=2 minutes 21 seconds. Withdrawal time=9 minutes 03 seconds.  The scope was withdrawn and the procedure  completed. COMPLICATIONS: There were no complications.  ENDOSCOPIC IMPRESSION: 1.   Small lipoma at the hepatic flexure; biopsy of the lesion performed 2.   Mild diverticulosis in the transverse colon, descending colon, and sigmoid colon 3.   Small internal hemorrhoids  RECOMMENDATIONS: 1.  Await pathology results 2.  High fiber diet with liberal fluid intake. 3.  Repeat Colonoscopy in 5 years.  eSigned:  Meryl Dare, MD, [facility] 12/09/2012 3:01 PM

## 2012-12-09 NOTE — Patient Instructions (Signed)

## 2012-12-09 NOTE — Progress Notes (Signed)
Report to pacu, rn, vss, bbs=clear 

## 2012-12-09 NOTE — Progress Notes (Signed)
No complaints noted in the recovery room. Maw   

## 2012-12-10 ENCOUNTER — Telehealth: Payer: Self-pay

## 2012-12-10 NOTE — Telephone Encounter (Signed)
  Follow up Call-  Call back number 12/09/2012  Post procedure Call Back phone  # [phone]  Permission to leave phone message Yes     Patient questions:  Do you have a fever, pain , or abdominal swelling? no Pain Score  0 *  Have you tolerated food without any problems? yes  Have you been able to return to your normal activities? yes  Do you have any questions about your discharge instructions: Diet   no Medications  no Follow up visit  no  Do you have questions or concerns about your Care? no  Actions: * If pain score is 4 or above: No action needed, pain <4.

## 2012-12-14 ENCOUNTER — Encounter: Payer: Self-pay | Admitting: Gastroenterology

## 2013-01-25 ENCOUNTER — Other Ambulatory Visit: Payer: Self-pay

## 2013-01-25 DIAGNOSIS — Z1231 Encounter for screening mammogram for malignant neoplasm of breast: Secondary | ICD-10-CM

## 2013-01-26 ENCOUNTER — Encounter: Payer: Self-pay | Admitting: Family Medicine

## 2013-01-26 ENCOUNTER — Ambulatory Visit (INDEPENDENT_AMBULATORY_CARE_PROVIDER_SITE_OTHER): Payer: 59 | Admitting: Family Medicine

## 2013-01-26 VITALS — BP 134/66 | HR 86 | Temp 98.7°F | Wt 235.0 lb

## 2013-01-26 DIAGNOSIS — I10 Essential (primary) hypertension: Secondary | ICD-10-CM

## 2013-01-26 DIAGNOSIS — E669 Obesity, unspecified: Secondary | ICD-10-CM

## 2013-01-26 DIAGNOSIS — R609 Edema, unspecified: Secondary | ICD-10-CM

## 2013-01-26 DIAGNOSIS — R600 Localized edema: Secondary | ICD-10-CM

## 2013-01-26 NOTE — Progress Notes (Signed)
   Subjective:    Patient ID: [name], female    DOB: [birth year], 58 y.o.   MRN: [id number]  HPI Patient seen with concern for right foot edema.  Has some bilateral leg edema but right is slightly worse. She has chronic problems of obesity and hypertension. She remains on amlodipine valsartan and HCTZ for hypertension. Blood pressures been fairly well controlled. She has recently started working out has joined a gym. She denies any calf pain. No recent color changes lower extremity. No thigh pain. No dyspnea. No chest pains. Compliant with medications.  She does not take any regular nonsteroidals. She denies any claudication symptoms. No history of smoking. No history of peripheral vascular disease.  Past Medical History  Diagnosis Date  . Anemia   . Atrial fibrillation   . Headache(784.0)   . Hypertension   . GERD (gastroesophageal reflux disease)   . Gastritis   . Fatty liver   . Diverticulosis   . Internal hemorrhoids   . Depression   . OSA (obstructive sleep apnea)   . Asthma   . Adenomatous colon polyp 05/2002  . Hemorrhoids    Past Surgical History  Procedure Laterality Date  . Partial hysterectomy    . Appendectomy    . Breast reduction surgery    . Total abdominal hysterectomy w/ bilateral salpingoophorectomy    . Cervical fusion      reports that she has never smoked. She has never used smokeless tobacco. She reports that she does not drink alcohol or use illicit drugs. family history includes Coronary artery disease in her sister; Diabetes in her mother; Hypertension in her mother and sister. There is no history of Colon cancer. Allergies  Allergen Reactions  . Tramadol     GI upset, "I felt like I was in a fog"      Review of Systems  Constitutional: Negative for fatigue.  Eyes: Negative for visual disturbance.  Respiratory: Negative for cough, chest tightness, shortness of breath and wheezing.   Cardiovascular: Negative for chest pain, palpitations  and leg swelling.  Neurological: Negative for dizziness, seizures, syncope, weakness, light-headedness and headaches.       Objective:   Physical Exam  Constitutional: She appears well-developed and well-nourished.  Cardiovascular: Normal rate and regular rhythm.  Exam reveals no gallop.   No murmur heard. Pulmonary/Chest: Effort normal and breath sounds normal. No respiratory distress. She has no wheezes. She has no rales.  Musculoskeletal: She exhibits edema.  She has trace pitting edema calves and feet and ankles bilaterally. Slightly worse right foot and ankle. She has an excellent capillary refill bilaterally. Both feet are warm to touch with 2 plus DP pulses. No calf tenderness.          Assessment & Plan:  #1 hypertension. Well controlled #2 mild peripheral edema. Suspect venous stasis and also she does take amlodipine. She does not have evidence clinically to suggest DVT. We recommend frequent elevation, compression hose, and watch sodium intake closely along with weight loss. #3 family history of type 2 diabetes. Schedule complete physical and fasting labs. #4 Obesity.   We discussed at some length strategies for weight loss. We suggested tracking application to monitor her calorie intake and energy expenditure.

## 2013-01-26 NOTE — Patient Instructions (Signed)
Consider App such as MyFitnessPal to track calories and energy expenditure. Elevated legs frequently Keep sodium to less than 3,000 mg daily

## 2013-01-26 NOTE — Progress Notes (Signed)
Pre visit review using our clinic review tool, if applicable. No additional management support is needed unless otherwise documented below in the visit note. 

## 2013-02-07 ENCOUNTER — Encounter: Payer: Self-pay | Admitting: Family Medicine

## 2013-02-07 ENCOUNTER — Ambulatory Visit (INDEPENDENT_AMBULATORY_CARE_PROVIDER_SITE_OTHER): Payer: 59 | Admitting: Family Medicine

## 2013-02-07 VITALS — BP 124/70 | HR 73 | Temp 98.4°F | Ht 64.0 in | Wt 235.0 lb

## 2013-02-07 DIAGNOSIS — Z0000 Encounter for general adult medical examination without abnormal findings: Secondary | ICD-10-CM

## 2013-02-07 LAB — CBC WITH DIFFERENTIAL/PLATELET
Basophils Absolute: 0 10*3/uL (ref 0.0–0.1)
Basophils Relative: 0.4 % (ref 0.0–3.0)
Eosinophils Absolute: 0.2 10*3/uL (ref 0.0–0.7)
Eosinophils Relative: 2.6 % (ref 0.0–5.0)
HCT: 43.1 % (ref 36.0–46.0)
Hemoglobin: 14.1 g/dL (ref 12.0–15.0)
Lymphocytes Relative: 47.9 % — ABNORMAL HIGH (ref 12.0–46.0)
Lymphs Abs: 3.1 10*3/uL (ref 0.7–4.0)
MCHC: 32.7 g/dL (ref 30.0–36.0)
MCV: 99.2 fl (ref 78.0–100.0)
Monocytes Absolute: 0.6 10*3/uL (ref 0.1–1.0)
Monocytes Relative: 9.5 % (ref 3.0–12.0)
Neutro Abs: 2.5 10*3/uL (ref 1.4–7.7)
Neutrophils Relative %: 39.6 % — ABNORMAL LOW (ref 43.0–77.0)
Platelets: 364 10*3/uL (ref 150.0–400.0)
RBC: 4.34 Mil/uL (ref 3.87–5.11)
RDW: 13.8 % (ref 11.5–14.6)
WBC: 6.4 10*3/uL (ref 4.5–10.5)

## 2013-02-07 LAB — HEPATIC FUNCTION PANEL
ALT: 25 U/L (ref 0–35)
AST: 20 U/L (ref 0–37)
Albumin: 4 g/dL (ref 3.5–5.2)
Alkaline Phosphatase: 60 U/L (ref 39–117)
Bilirubin, Direct: 0 mg/dL (ref 0.0–0.3)
Total Bilirubin: 0.7 mg/dL (ref 0.3–1.2)
Total Protein: 7.2 g/dL (ref 6.0–8.3)

## 2013-02-07 LAB — LIPID PANEL
Cholesterol: 133 mg/dL (ref 0–200)
HDL: 42.8 mg/dL (ref >39.00)
LDL Cholesterol: 65 mg/dL (ref 0–99)
Total CHOL/HDL Ratio: 3
Triglycerides: 124 mg/dL (ref 0.0–149.0)
VLDL: 24.8 mg/dL (ref 0.0–40.0)

## 2013-02-07 LAB — BASIC METABOLIC PANEL
BUN: 10 mg/dL (ref 6–23)
CO2: 29 mEq/L (ref 19–32)
Calcium: 9.5 mg/dL (ref 8.4–10.5)
Chloride: 104 mEq/L (ref 96–112)
Creatinine, Ser: 0.7 mg/dL (ref 0.4–1.2)
GFR: 113.26 mL/min (ref >60.00)
Glucose, Bld: 78 mg/dL (ref 70–99)
Potassium: 3.9 mEq/L (ref 3.5–5.1)
Sodium: 141 mEq/L (ref 135–145)

## 2013-02-07 LAB — TSH: TSH: 0.96 u[IU]/mL (ref 0.35–5.50)

## 2013-02-07 NOTE — Progress Notes (Signed)
Subjective:    Patient ID: [name], female    DOB: 05-05-1955, 58 y.o.   MRN: [id number]  HPI Patient here for complete physical For chronic problems include history of hypertension, obesity, chronic neck pain, history of depression. She's had previous hysterectomy for benign disease. She is followed still by gynecology. She has recently started exercise program and hopes to lose some weight through that. Tetanus up-to-date. She had colonoscopy last year. She is getting mammograms through her gynecologist.  Blood pressures have been stable. No recent orthostasis.  Past Medical History  Diagnosis Date  . Anemia   . Atrial fibrillation   . Headache(784.0)   . Hypertension   . GERD (gastroesophageal reflux disease)   . Gastritis   . Fatty liver   . Diverticulosis   . Internal hemorrhoids   . Depression   . OSA (obstructive sleep apnea)   . Asthma   . Adenomatous colon polyp 05/2002  . Hemorrhoids    Past Surgical History  Procedure Laterality Date  . Partial hysterectomy    . Appendectomy    . Breast reduction surgery    . Total abdominal hysterectomy w/ bilateral salpingoophorectomy    . Cervical fusion      reports that she has never smoked. She has never used smokeless tobacco. She reports that she does not drink alcohol or use illicit drugs. family history includes Coronary artery disease in her sister; Diabetes in her mother; Hypertension in her mother and sister. There is no history of Colon cancer. Allergies  Allergen Reactions  . Tramadol     GI upset, "I felt like I was in a fog"      Review of Systems  Constitutional: Positive for fatigue. Negative for fever, activity change, appetite change and unexpected weight change.  HENT: Negative for ear pain, hearing loss, sore throat and trouble swallowing.   Eyes: Negative for visual disturbance.  Respiratory: Negative for cough and shortness of breath.   Cardiovascular: Negative for chest pain and  palpitations.  Gastrointestinal: Negative for abdominal pain, diarrhea, constipation and blood in stool.  Endocrine: Negative for polydipsia and polyuria.  Genitourinary: Negative for dysuria and hematuria.  Musculoskeletal: Positive for back pain. Negative for arthralgias and myalgias.  Skin: Negative for rash.  Neurological: Negative for dizziness, syncope and headaches.  Hematological: Negative for adenopathy.  Psychiatric/Behavioral: Negative for confusion and dysphoric mood.       Objective:   Physical Exam  Constitutional: She is oriented to person, place, and time. She appears well-developed and well-nourished.  HENT:  Head: Normocephalic and atraumatic.  Eyes: EOM are normal. Pupils are equal, round, and reactive to light.  Neck: Normal range of motion. Neck supple. No thyromegaly present.  Cardiovascular: Normal rate, regular rhythm and normal heart sounds.   No murmur heard. Pulmonary/Chest: Breath sounds normal. No respiratory distress. She has no wheezes. She has no rales.  Abdominal: Soft. Bowel sounds are normal. She exhibits no distension and no mass. There is no tenderness. There is no rebound and no guarding.  Genitourinary:  Per GYN  Musculoskeletal: Normal range of motion. She exhibits no edema.  Lymphadenopathy:    She has no cervical adenopathy.  Neurological: She is alert and oriented to person, place, and time. She displays normal reflexes. No cranial nerve deficit.  Skin: No rash noted.  Psychiatric: She has a normal mood and affect. Her behavior is normal. Judgment and thought content normal.          Assessment &  Plan:  Complete physical. Order screening lab work. She will continue GYN followup. We talked at some length about importance of weight loss and discussed strategies both for calorie restriction and exercise. Colonoscopy up to date. Immunizations up-to-date.

## 2013-02-07 NOTE — Patient Instructions (Signed)
Try to lose some weight and establish regular exercise

## 2013-02-07 NOTE — Progress Notes (Signed)
Pre visit review using our clinic review tool, if applicable. No additional management support is needed unless otherwise documented below in the visit note. 

## 2013-02-08 LAB — VITAMIN D 25 HYDROXY (VIT D DEFICIENCY, FRACTURES): Vit D, 25-Hydroxy: 62 ng/mL (ref 30–89)

## 2013-02-09 ENCOUNTER — Telehealth: Payer: Self-pay | Admitting: Family Medicine

## 2013-02-09 NOTE — Telephone Encounter (Signed)
Relevant patient education mailed to patient.  

## 2013-02-28 ENCOUNTER — Ambulatory Visit: Payer: 59

## 2013-03-03 ENCOUNTER — Ambulatory Visit: Payer: 59

## 2013-03-15 ENCOUNTER — Ambulatory Visit: Payer: 59

## 2013-03-27 ENCOUNTER — Other Ambulatory Visit: Payer: Self-pay | Admitting: Family Medicine

## 2013-04-18 ENCOUNTER — Telehealth: Payer: Self-pay | Admitting: Gastroenterology

## 2013-04-18 NOTE — Telephone Encounter (Signed)
Patient reports that she has a week of epigastric pain.  Pain is constant with no relieving factors.  She states is a sharp "Like pins", occasional nausea.  She has tried OTC antacids that made her symptoms worse.  She will start on Prilosec OTC and come see Alonza Bogus, PA tomorrow at 11:00

## 2013-04-19 ENCOUNTER — Other Ambulatory Visit (INDEPENDENT_AMBULATORY_CARE_PROVIDER_SITE_OTHER): Payer: 59

## 2013-04-19 ENCOUNTER — Encounter: Payer: Self-pay | Admitting: Gastroenterology

## 2013-04-19 ENCOUNTER — Ambulatory Visit (INDEPENDENT_AMBULATORY_CARE_PROVIDER_SITE_OTHER): Payer: 59 | Admitting: Gastroenterology

## 2013-04-19 VITALS — Ht 64.0 in | Wt 233.0 lb

## 2013-04-19 DIAGNOSIS — R1012 Left upper quadrant pain: Secondary | ICD-10-CM

## 2013-04-19 DIAGNOSIS — R1013 Epigastric pain: Secondary | ICD-10-CM

## 2013-04-19 LAB — AMYLASE: Amylase: 57 U/L (ref 27–131)

## 2013-04-19 LAB — LIPASE: Lipase: 21 U/L (ref 11.0–59.0)

## 2013-04-19 MED ORDER — OMEPRAZOLE 40 MG PO CPDR: 40.0000 mg | DELAYED_RELEASE_CAPSULE | Freq: Every day | ORAL | Status: DC

## 2013-04-19 NOTE — Progress Notes (Signed)
04/19/2013 [name] [id number] April 14, 1955   History of Present Illness:  This is a pleasant 58 year old female who is known to Dr. Fuller Plan for previous colonoscopies. She presents to our office today with complaints of epigastric and left upper quadrant abdominal pain that began on Friday, just 4 days ago. She states that the pain is constant, but does seem worse with eating certain things such as orange juice. She describes it as sharp pain "like pins". There is some mild nausea, but no vomiting. She did get an over-the-counter PPI and just started taking that yesterday. She noticed some very minimal improvement with that so far. She does admit that she was taking Aleve approximately one to 2 pills every day for the past 1 month or so for her arthritic pain/spinal stenosis. She also takes a baby aspirin 81 mg daily. She has undergone EGD in the past, but the last was in 2000.  Never had similar pain in the past.  Denies ETOH use.  Has some loose stools, but contributes that to eating more fruits/vegetables recently.  Denies dark or bloody stools.     Current Medications, Allergies, Past Medical History, Past Surgical History, Family History and Social History were reviewed in [employer] record.   Physical Exam: Ht 5\' 4"  (1.626 m)  Wt 233 lb (105.688 kg)  BMI 39.97 kg/m2 General: Well developed black female in no acute distress Head: Normocephalic and atraumatic Eyes:  Sclerae anicteric, conjunctiva pink  Ears: Normal auditory acuity Lungs: Clear throughout to auscultation Heart: Regular rate and rhythm Abdomen: Soft, obese, non-distended.  Normal bowel sounds.  Moderate epigastric and LUQ TTP without R/R/G. Musculoskeletal: Symmetrical with no gross deformities  Extremities: No edema  Neurological: Alert oriented x 4, grossly non-focal Psychological:  Alert and cooperative. Normal mood and affect  Assessment and Recommendations: -Epigastric and LUQ abdominal  pain x 4 days:  Was taking NSAID's regularly along with ASA 81 mg daily.  ? Gastritis vs ulcer disease vs mild pancreatitis (less likely).  Will check amylase and lipase.  I have asked her to discontinue taking the Aleve. We'll start her on omeprazole 40 mg daily. She may need EGD if symptoms do not improve/resolve. We will bring her back for followup in 4 weeks, however, she will call in the interim if symptoms worsen or show no improvement.  If amylase and lipase are elevated may need a CT scan.

## 2013-04-19 NOTE — Patient Instructions (Addendum)
Please make a follow up visit with Dr. Fuller Plan  Please stop your Aleve  Please increase your over the counter anti-acid medication to twice daily  When finished with the over the counter anti-acid medication start Omeprazole 40 mg once daily. Prescription was sent to your pharmacy   Your physician has requested that you go to the basement for the following lab work before leaving today: Amylase Lipase

## 2013-04-19 NOTE — Progress Notes (Signed)
Reviewed and agree with management plan.  Bereket Gernert T. Dayyan Krist, MD FACG 

## 2013-04-29 ENCOUNTER — Ambulatory Visit (HOSPITAL_COMMUNITY)
Admission: RE | Admit: 2013-04-29 | Discharge: 2013-04-29 | Disposition: A | Discharge status: Home / Self Care | Payer: 59 | Source: Ambulatory Visit | Attending: Family Medicine | Admitting: Family Medicine

## 2013-04-29 ENCOUNTER — Other Ambulatory Visit: Payer: Self-pay

## 2013-04-29 ENCOUNTER — Other Ambulatory Visit (HOSPITAL_COMMUNITY): Payer: Self-pay | Admitting: Obstetrics and Gynecology

## 2013-04-29 DIAGNOSIS — Z1231 Encounter for screening mammogram for malignant neoplasm of breast: Secondary | ICD-10-CM

## 2013-05-03 ENCOUNTER — Telehealth: Payer: Self-pay | Admitting: Family Medicine

## 2013-05-03 NOTE — Telephone Encounter (Signed)
Pt is having gi issues. Top of stomach on left side and the middle hurts. Pt states this has been going on 2 weeks, getting worse. Pt would like referral to Dr Juanita Craver at [facility]  [phone] Pt states asap is possible

## 2013-05-04 ENCOUNTER — Emergency Department (HOSPITAL_COMMUNITY): Payer: 59

## 2013-05-04 ENCOUNTER — Telehealth: Payer: Self-pay | Admitting: Gastroenterology

## 2013-05-04 ENCOUNTER — Other Ambulatory Visit: Payer: Self-pay | Admitting: Family Medicine

## 2013-05-04 ENCOUNTER — Encounter (HOSPITAL_COMMUNITY): Payer: Self-pay | Admitting: Emergency Medicine

## 2013-05-04 ENCOUNTER — Emergency Department (HOSPITAL_COMMUNITY)
Admission: EM | Admit: 2013-05-04 | Discharge: 2013-05-04 | Disposition: A | Discharge status: Home / Self Care | Payer: 59 | Attending: Emergency Medicine | Admitting: Emergency Medicine

## 2013-05-04 DIAGNOSIS — Z8739 Personal history of other diseases of the musculoskeletal system and connective tissue: Secondary | ICD-10-CM | POA: Insufficient documentation

## 2013-05-04 DIAGNOSIS — Z8669 Personal history of other diseases of the nervous system and sense organs: Secondary | ICD-10-CM | POA: Insufficient documentation

## 2013-05-04 DIAGNOSIS — R110 Nausea: Secondary | ICD-10-CM | POA: Insufficient documentation

## 2013-05-04 DIAGNOSIS — Z8659 Personal history of other mental and behavioral disorders: Secondary | ICD-10-CM | POA: Insufficient documentation

## 2013-05-04 DIAGNOSIS — Z860100 Personal history of colon polyps, unspecified: Secondary | ICD-10-CM | POA: Insufficient documentation

## 2013-05-04 DIAGNOSIS — R143 Flatulence: Secondary | ICD-10-CM

## 2013-05-04 DIAGNOSIS — Z7982 Long term (current) use of aspirin: Secondary | ICD-10-CM | POA: Insufficient documentation

## 2013-05-04 DIAGNOSIS — R1012 Left upper quadrant pain: Secondary | ICD-10-CM

## 2013-05-04 DIAGNOSIS — Z90710 Acquired absence of both cervix and uterus: Secondary | ICD-10-CM | POA: Insufficient documentation

## 2013-05-04 DIAGNOSIS — R638 Other symptoms and signs concerning food and fluid intake: Secondary | ICD-10-CM | POA: Insufficient documentation

## 2013-05-04 DIAGNOSIS — J45909 Unspecified asthma, uncomplicated: Secondary | ICD-10-CM | POA: Insufficient documentation

## 2013-05-04 DIAGNOSIS — I10 Essential (primary) hypertension: Secondary | ICD-10-CM | POA: Insufficient documentation

## 2013-05-04 DIAGNOSIS — R109 Unspecified abdominal pain: Secondary | ICD-10-CM

## 2013-05-04 DIAGNOSIS — Z9889 Other specified postprocedural states: Secondary | ICD-10-CM | POA: Insufficient documentation

## 2013-05-04 DIAGNOSIS — K219 Gastro-esophageal reflux disease without esophagitis: Secondary | ICD-10-CM | POA: Insufficient documentation

## 2013-05-04 DIAGNOSIS — R141 Gas pain: Secondary | ICD-10-CM | POA: Insufficient documentation

## 2013-05-04 DIAGNOSIS — Z862 Personal history of diseases of the blood and blood-forming organs and certain disorders involving the immune mechanism: Secondary | ICD-10-CM | POA: Insufficient documentation

## 2013-05-04 DIAGNOSIS — R195 Other fecal abnormalities: Secondary | ICD-10-CM | POA: Insufficient documentation

## 2013-05-04 DIAGNOSIS — Z79899 Other long term (current) drug therapy: Secondary | ICD-10-CM | POA: Insufficient documentation

## 2013-05-04 DIAGNOSIS — R142 Eructation: Secondary | ICD-10-CM

## 2013-05-04 DIAGNOSIS — Z9079 Acquired absence of other genital organ(s): Secondary | ICD-10-CM | POA: Insufficient documentation

## 2013-05-04 LAB — COMPREHENSIVE METABOLIC PANEL
ALT: 23 U/L (ref 0–35)
AST: 19 U/L (ref 0–37)
Albumin: 4 g/dL (ref 3.5–5.2)
Alkaline Phosphatase: 73 U/L (ref 39–117)
BUN: 8 mg/dL (ref 6–23)
CO2: 27 mEq/L (ref 19–32)
Calcium: 9.7 mg/dL (ref 8.4–10.5)
Chloride: 98 mEq/L (ref 96–112)
Creatinine, Ser: 0.75 mg/dL (ref 0.50–1.10)
GFR calc Af Amer: >90 mL/min (ref >90)
GFR calc non Af Amer: >90 mL/min (ref >90)
Glucose, Bld: 90 mg/dL (ref 70–99)
Potassium: 3.8 mEq/L (ref 3.7–5.3)
Sodium: 138 mEq/L (ref 137–147)
Total Bilirubin: 0.4 mg/dL (ref 0.3–1.2)
Total Protein: 7.7 g/dL (ref 6.0–8.3)

## 2013-05-04 LAB — CBC WITH DIFFERENTIAL/PLATELET
Basophils Absolute: 0 10*3/uL (ref 0.0–0.1)
Basophils Relative: 1 % (ref 0–1)
Eosinophils Absolute: 0.1 10*3/uL (ref 0.0–0.7)
Eosinophils Relative: 2 % (ref 0–5)
HCT: 43 % (ref 36.0–46.0)
Hemoglobin: 14.7 g/dL (ref 12.0–15.0)
Lymphocytes Relative: 52 % — ABNORMAL HIGH (ref 12–46)
Lymphs Abs: 3 10*3/uL (ref 0.7–4.0)
MCH: 32.1 pg (ref 26.0–34.0)
MCHC: 34.2 g/dL (ref 30.0–36.0)
MCV: 93.9 fL (ref 78.0–100.0)
Monocytes Absolute: 0.6 10*3/uL (ref 0.1–1.0)
Monocytes Relative: 9 % (ref 3–12)
Neutro Abs: 2.1 10*3/uL (ref 1.7–7.7)
Neutrophils Relative %: 36 % — ABNORMAL LOW (ref 43–77)
Platelets: 364 10*3/uL (ref 150–400)
RBC: 4.58 MIL/uL (ref 3.87–5.11)
RDW: 12.7 % (ref 11.5–15.5)
WBC: 5.8 10*3/uL (ref 4.0–10.5)

## 2013-05-04 LAB — URINALYSIS, ROUTINE W REFLEX MICROSCOPIC
Bilirubin Urine: NEGATIVE
Glucose, UA: NEGATIVE mg/dL
Hgb urine dipstick: NEGATIVE
Ketones, ur: NEGATIVE mg/dL
Leukocytes, UA: NEGATIVE
Nitrite: NEGATIVE
Protein, ur: NEGATIVE mg/dL
Specific Gravity, Urine: 1.004 — ABNORMAL LOW (ref 1.005–1.030)
Urobilinogen, UA: 0.2 mg/dL (ref 0.0–1.0)
pH: 7 (ref 5.0–8.0)

## 2013-05-04 LAB — LIPASE, BLOOD: Lipase: 25 U/L (ref 11–59)

## 2013-05-04 LAB — I-STAT TROPONIN, ED: Troponin i, poc: 0 ng/mL (ref 0.00–0.08)

## 2013-05-04 LAB — POC OCCULT BLOOD, ED: Fecal Occult Bld: NEGATIVE

## 2013-05-04 MED ORDER — SODIUM CHLORIDE 0.9 % IV BOLUS (SEPSIS)
500.0000 mL | Freq: Once | INTRAVENOUS | Status: AC
  Administered 2013-05-04: 500 mL via INTRAVENOUS

## 2013-05-04 MED ORDER — GI COCKTAIL ~~LOC~~
30.0000 mL | Freq: Once | ORAL | Status: AC
  Administered 2013-05-04: 30 mL via ORAL
  Filled 2013-05-04: qty 30

## 2013-05-04 MED ORDER — DICYCLOMINE HCL 10 MG PO CAPS
10.0000 mg | ORAL_CAPSULE | Freq: Once | ORAL | Status: AC
  Administered 2013-05-04: 10 mg via ORAL
  Filled 2013-05-04: qty 1

## 2013-05-04 MED ORDER — FAMOTIDINE 20 MG PO TABS: 40.0000 mg | ORAL_TABLET | Freq: Every evening | ORAL | Status: DC | PRN

## 2013-05-04 MED ORDER — FAMOTIDINE IN NACL 20-0.9 MG/50ML-% IV SOLN
20.0000 mg | Freq: Once | INTRAVENOUS | Status: AC
  Administered 2013-05-04: 20 mg via INTRAVENOUS
  Filled 2013-05-04: qty 50

## 2013-05-04 NOTE — ED Provider Notes (Signed)
CSN: [id number]     Arrival date & time 05/04/13  1440 History   First MD Initiated Contact with Patient 05/04/13 1540     Chief Complaint  Patient presents with  . Flank Pain     (Consider location/radiation/quality/duration/timing/severity/associated sxs/prior Treatment) HPI  [name] is a 58 y.o. female complaining of left upper quadrant pain,, pressure like, 7/42 at worst, colicky intermittent, exacerbated after eating for 2 weeks. Patient also endorses normally formed stool however it is darker than normal, she endorses nausea and decreased by mouth intake with no vomiting or fever. Normal urination and no vaginal discharge. Patient saw her gastroenterologist, Dr. Silvio Pate physician assistants earlier and was started on Prilosec which she self DC'd it because she said it made her feel bloated. Patient versus a pressure-like sensation. Denies chest pain, shortness of breath.   Past Medical History  Diagnosis Date  . Anemia   . Atrial fibrillation   . Headache(784.0)   . Hypertension   . GERD (gastroesophageal reflux disease)   . Gastritis   . Fatty liver   . Diverticulosis   . Internal hemorrhoids   . Depression   . OSA (obstructive sleep apnea)   . Asthma   . Adenomatous colon polyp 05/2002  . Hemorrhoids   . Spinal stenosis    Past Surgical History  Procedure Laterality Date  . Partial hysterectomy    . Appendectomy    . Breast reduction surgery    . Total abdominal hysterectomy w/ bilateral salpingoophorectomy    . Cervical fusion     Family History  Problem Relation Age of Onset  . Diabetes Mother   . Hypertension Mother   . Coronary artery disease Sister   . Hypertension Sister   . Colon cancer Neg Hx    History  Substance Use Topics  . Smoking status: Never Smoker   . Smokeless tobacco: Never Used  . Alcohol Use: No   OB History   Grav Para Term Preterm Abortions TAB SAB Ect Mult Living                 Review of Systems  10 systems  reviewed and found to be negative, except as noted in the HPI.  Allergies  Tramadol  Home Medications   Prior to Admission medications   Medication Sig Start Date End Date Taking? Authorizing Provider  amLODipine-valsartan (EXFORGE) 5-160 MG per tablet TAKE 1 TABLET DAILY   Yes [name], MD  aspirin 81 MG tablet Take 81 mg by mouth daily.   Yes Historical Provider, MD  Calcium Carbonate-Vitamin D (CALCIUM 600+D) 600-400 MG-UNIT per tablet Take 1 tablet by mouth daily.     Yes Historical Provider, MD  estradiol (VIVELLE-DOT) 0.05 MG/24HR Place 1 patch onto the skin 2 (two) times a week. Apply Sundays and Thursdays   Yes Historical Provider, MD  fexofenadine (ALLEGRA) 180 MG tablet Take 180 mg by mouth daily as needed for allergies or rhinitis.   Yes Historical Provider, MD  hydrochlorothiazide (MICROZIDE) 12.5 MG capsule TAKE 1 CAPSULE DAILY   Yes [name], MD  Multiple Vitamin (MULTIVITAMIN) tablet Take 1 tablet by mouth daily.   Yes Historical Provider, MD  Probiotic Product (SOLUBLE FIBER/PROBIOTICS PO) Take 1 tablet by mouth every morning.   Yes Historical Provider, MD   BP 155/76  Pulse 64  Temp(Src) 97.6 F (36.4 C) (Oral)  Resp 16  SpO2 96% Physical Exam  Nursing note and vitals reviewed. Constitutional: She is oriented  to person, place, and time. She appears well-developed and well-nourished. No distress.  HENT:  Head: Normocephalic and atraumatic.  Mouth/Throat: Oropharynx is clear and moist.  Eyes: Conjunctivae and EOM are normal. Pupils are equal, round, and reactive to light.  Neck: Normal range of motion. Neck supple.  Cardiovascular: Normal rate, regular rhythm and intact distal pulses.   Pulmonary/Chest: Effort normal and breath sounds normal. No stridor. No respiratory distress. She has no wheezes. She has no rales. She exhibits no tenderness.  Abdominal: Soft. She exhibits no distension and no mass. There is tenderness. There is no rebound and no  guarding.  Tender in the left upper quadrant with no guarding or rebound  Musculoskeletal: Normal range of motion.  Neurological: She is alert and oriented to person, place, and time.  Psychiatric: She has a normal mood and affect.    ED Course  Procedures (including critical care time) Labs Review Labs Reviewed  CBC WITH DIFFERENTIAL - Abnormal; Notable for the following:    Neutrophils Relative % 36 (*)    Lymphocytes Relative 52 (*)    All other components within normal limits  URINALYSIS, ROUTINE W REFLEX MICROSCOPIC - Abnormal; Notable for the following:    Specific Gravity, Urine 1.004 (*)    All other components within normal limits  LIPASE, BLOOD  COMPREHENSIVE METABOLIC PANEL  OCCULT BLOOD X 1 CARD TO LAB, STOOL  I-STAT TROPOININ, ED  POC OCCULT BLOOD, ED    Imaging Review Dg Chest 1 View  05/04/2013   CLINICAL DATA:  Chest pain.  EXAM: CHEST - 1 VIEW  COMPARISON:  January 28, 2012.  FINDINGS: The heart size and mediastinal contours are within normal limits. Both lungs are clear. No pneumothorax or pleural effusion is noted. Status post surgical fusion of lower cervical spine.  IMPRESSION: No acute cardiopulmonary abnormality seen.   Electronically Signed   By: Sabino Dick M.D.   On: 05/04/2013 17:13     EKG Interpretation None      MDM   Final diagnoses:  LUQ abdominal pain    Filed Vitals:   05/04/13 1455 05/04/13 1744  BP: 135/73 155/76  Pulse: 74 64  Temp: 98.1 F (36.7 C) 97.6 F (36.4 C)  TempSrc: Oral   Resp: 18 16  SpO2: 97% 96%    Medications  sodium chloride 0.9 % bolus 500 mL (0 mLs Intravenous Stopped 05/04/13 1736)  dicyclomine (BENTYL) capsule 10 mg (10 mg Oral Given 05/04/13 1707)  famotidine (PEPCID) IVPB 20 mg (0 mg Intravenous Stopped 05/04/13 1726)  gi cocktail (Maalox,Lidocaine,Donnatal) (30 mLs Oral Given 05/04/13 1636)    [name] is a 58 y.o. female presenting with left flank pain for 2 weeks associated with nausea and  decreased by mouth intake. Serial abdominal exams remained benign. EKG is nonischemic and troponin is negative, guaiac is also negative UA and blood work with no significant abnormalities. Patient reporting significant subjective improvement after hydration and meds.  Deatra Ina with [facility] asked to start PPI and call office for expedited abbouintmentr  Evaluation does not show pathology that would require ongoing emergent intervention or inpatient treatment. Pt is hemodynamically stable and mentating appropriately. Discussed findings and plan with patient/guardian, who agrees with care plan. All questions answered. Return precautions discussed and outpatient follow up given.   Discharge Medication List as of 05/04/2013  5:34 PM    START taking these medications   Details  famotidine (PEPCID) 20 MG tablet Take 2 tablets (40 mg total)  by mouth at bedtime as needed for heartburn., Starting 05/04/2013, Until Discontinued, Print        Note: Portions of this report may have been transcribed using voice recognition software. Every effort was made to ensure accuracy; however, inadvertent computerized transcription errors may be present    Monico Blitz, PA-C 05/04/13 1755

## 2013-05-04 NOTE — Telephone Encounter (Signed)
Pt evaluated for ongoing abd pain.  Evaluation negative.  ER requested she get office followup next few days.  Probably needs EGD.

## 2013-05-04 NOTE — Telephone Encounter (Signed)
Referral ordered

## 2013-05-04 NOTE — Discharge Instructions (Signed)
Please follow with your primary care doctor in the next 2 days for a check-up. They must obtain records for further management.  ° °Do not hesitate to return to the Emergency Department for any new, worsening or concerning symptoms.  ° °

## 2013-05-04 NOTE — ED Notes (Signed)
Pt escorted to discharge window. Pt verbalized understanding discharge instructions. In no acute distress.  

## 2013-05-04 NOTE — ED Notes (Addendum)
Per pt, pain to left flank for 2 weeks.  Nausea for 3 days.  No fever.  No difficulty urinating or change in color.  Pt went to MD soon after it began. Pt given script for prilosec.  Pt states it increases gastric pressure and so she stopped taking it.

## 2013-05-04 NOTE — Telephone Encounter (Signed)
Pt was last seen 02/07/2013

## 2013-05-04 NOTE — Telephone Encounter (Signed)
Pt informed that referral has been ordered. Can we get a STAT for her please.

## 2013-05-04 NOTE — Telephone Encounter (Signed)
Pt would like a stat referral for her gi issues. Pt is trying to keep from going to ED

## 2013-05-04 NOTE — Telephone Encounter (Signed)
OK to refer.

## 2013-05-04 NOTE — ED Provider Notes (Signed)
Medical screening examination/treatment/procedure(s) were performed by non-physician practitioner and as supervising physician I was immediately available for consultation/collaboration.  Aviraj Kentner T Merve Hotard, MD 05/04/13 2258 

## 2013-05-05 ENCOUNTER — Telehealth: Payer: Self-pay | Admitting: Gastroenterology

## 2013-05-05 DIAGNOSIS — R109 Unspecified abdominal pain: Secondary | ICD-10-CM

## 2013-05-05 NOTE — Telephone Encounter (Signed)
Message Received: Today     Ladene Artist, MD Kellie Moor, RN            Message from [place] that pt was in ED last night. Saw JZ 2 weeks ago in the office. Please schedule an EGD soon and abd/pelvic CT if one hasn't been done in the last few weeks. Increase her PPI to bid.                     Patient is scheduled for a pre-visit tomorrow at at 8:30.  She will pick up her contrast for her CT on 05/06/13 at 1:00 at [facility] and she is scheduled for EGD on 05/11/13 1:30

## 2013-05-06 ENCOUNTER — Ambulatory Visit: Payer: 59 | Admitting: *Deleted

## 2013-05-06 ENCOUNTER — Telehealth: Payer: Self-pay | Admitting: Gastroenterology

## 2013-05-06 ENCOUNTER — Ambulatory Visit: Admission: RE | Admit: 2013-05-06 | Payer: 59 | Source: Ambulatory Visit

## 2013-05-06 ENCOUNTER — Telehealth: Payer: Self-pay | Admitting: *Deleted

## 2013-05-06 VITALS — Ht 65.0 in | Wt 229.8 lb

## 2013-05-06 DIAGNOSIS — R1012 Left upper quadrant pain: Secondary | ICD-10-CM

## 2013-05-06 NOTE — Telephone Encounter (Signed)
[name]- this is the pt we spoke about, pt was told she was difficult intubation 15 years ago when she had her hysterectomy bc of the scarring from her anterior cervical fusion surgery, pt had colonoscopy in Dec 2014 at [facility] with no complications, pt is scheduled for EGD on Wednesday 05/11/13 at 130pm with Dr. Fuller Plan, is pt okay for [place]? pls advise-adm

## 2013-05-06 NOTE — Telephone Encounter (Signed)
I have left a message for the patient that not able to get rescheduled before her appt on Monday and would recommend she keep that for 1:30

## 2013-05-06 NOTE — Telephone Encounter (Signed)
April,  With her past history of successful procedures and no clear anesthesia documentation of "difficult intubation", she is cleared for her procedure at [facility].  Thanks,  Jenny Reichmann

## 2013-05-06 NOTE — Progress Notes (Signed)
No egg or soy allergy. Difficult intubation, pt has had anterior cervical fusion surgery which makes her hard to intubate, pt had colonoscopy at [facility] on 12/14 and had not issues.  No home O2.  No diet meds.

## 2013-05-09 ENCOUNTER — Ambulatory Visit (HOSPITAL_COMMUNITY)
Admission: RE | Admit: 2013-05-09 | Discharge: 2013-05-09 | Disposition: A | Discharge status: Home / Self Care | Payer: 59 | Source: Ambulatory Visit | Attending: Gastroenterology | Admitting: Gastroenterology

## 2013-05-09 ENCOUNTER — Encounter (HOSPITAL_COMMUNITY): Payer: Self-pay

## 2013-05-09 ENCOUNTER — Inpatient Hospital Stay: Admission: RE | Admit: 2013-05-09 | Payer: 59 | Source: Ambulatory Visit

## 2013-05-09 DIAGNOSIS — R1013 Epigastric pain: Secondary | ICD-10-CM | POA: Insufficient documentation

## 2013-05-09 DIAGNOSIS — R109 Unspecified abdominal pain: Secondary | ICD-10-CM

## 2013-05-09 MED ORDER — IOHEXOL 300 MG/ML  SOLN
100.0000 mL | Freq: Once | INTRAMUSCULAR | Status: AC | PRN
  Administered 2013-05-09: 100 mL via INTRAVENOUS

## 2013-05-11 ENCOUNTER — Encounter: Payer: Self-pay | Admitting: Gastroenterology

## 2013-05-11 ENCOUNTER — Telehealth: Payer: Self-pay | Admitting: Gastroenterology

## 2013-05-11 ENCOUNTER — Ambulatory Visit (AMBULATORY_SURGERY_CENTER): Payer: 59 | Admitting: Gastroenterology

## 2013-05-11 VITALS — BP 161/92 | HR 71 | Temp 97.1°F | Resp 17 | Ht 65.0 in | Wt 229.0 lb

## 2013-05-11 DIAGNOSIS — R1012 Left upper quadrant pain: Secondary | ICD-10-CM

## 2013-05-11 MED ORDER — OMEPRAZOLE 40 MG PO CPDR: 40.0000 mg | DELAYED_RELEASE_CAPSULE | Freq: Every day | ORAL | Status: DC

## 2013-05-11 MED ORDER — SODIUM CHLORIDE 0.9 % IV SOLN: 500.0000 mL | INTRAVENOUS | Status: DC

## 2013-05-11 NOTE — Op Note (Signed)
[facility]  [address] [place], [address]   ENDOSCOPY PROCEDURE REPORT  PATIENT: [name], [name]  MR#: [id number] BIRTHDATE: 1955-12-23 , 7  yrs. old GENDER: Female ENDOSCOPIST: Ladene Artist, MD, Marval Regal REFERRED BY:  Carolann Littler, M.D. PROCEDURE DATE:  05/11/2013 PROCEDURE:  EGD, diagnostic ASA CLASS:     Class II INDICATIONS:  abdominal pain in upper left quadrant. MEDICATIONS: MAC sedation, administered by CRNA and propofol (Diprivan) 200mg  IV TOPICAL ANESTHETIC: none DESCRIPTION OF PROCEDURE: After the risks benefits and alternatives of the procedure were thoroughly explained, informed consent was obtained.  The LB [id number] endoscope was introduced through the mouth and advanced to the second portion of the duodenum. Without limitations.  The instrument was slowly withdrawn as the mucosa was fully examined.  ESOPHAGUS: The mucosa of the esophagus appeared normal. STOMACH: The mucosa and folds of the stomach appeared normal. DUODENUM: The duodenal mucosa showed no abnormalities in the bulb and second portion of the duodenum.  Retroflexed views revealed a 4 cm hiatal hernia.  The scope was then withdrawn from the patient and the procedure completed.  COMPLICATIONS: There were no complications.  ENDOSCOPIC IMPRESSION: 1.   Small hiatal hernia 2.   The EGD otherwise appeared normal  RECOMMENDATIONS: 1.  Anti-reflux regimen long term 2.  PPI qam long term: omeprazole 40 mg po qam, 1 year of refills 3.  Call to schedule a follow-up appointment with your primary MD for follow up of presumed musculoskeletal or radicular pain and left adrenal finding. No GI cause for LUQ pain found.  e]  eSigned:  Ladene Artist, MD, [facility] 05/11/2013 1:49 PM

## 2013-05-11 NOTE — Progress Notes (Addendum)
Pt. C/o sore throat Dr. Fuller Plan aware and instructed pt. To gargle with warm salt water. 1415 pt. Given water to drink prior to discharge,tolerated fluids, but stated that top of lip feels like it is cut, after assessing top lip small area about size of pin head layer of skin noted rolled back.

## 2013-05-11 NOTE — Telephone Encounter (Signed)
Pt had EGD today. She notes bloodshot eyes and a rash under her chin since 6pm. Not sure this is related to her EGD but is possible. She has a mild sore throat and mild fatigue following EGD. No other complaints. Benadryl 25 mg po q6h as needed overnight. Call me or PCP if symptoms worsen tonight. She has appt to see her PCP tomorrow morning to further evaluate.

## 2013-05-11 NOTE — Patient Instructions (Signed)
YOU HAD AN ENDOSCOPIC PROCEDURE TODAY AT THE [facility]: Refer to the procedure report that was given to you for any specific questions about what was found during the examination.  If the procedure report does not answer your questions, please call your gastroenterologist to clarify.  If you requested that your care partner not be given the details of your procedure findings, then the procedure report has been included in a sealed envelope for you to review at your convenience later.  YOU SHOULD EXPECT: Some feelings of bloating in the abdomen. Passage of more gas than usual.  Walking can help get rid of the air that was put into your GI tract during the procedure and reduce the bloating. If you had a lower endoscopy (such as a colonoscopy or flexible sigmoidoscopy) you may notice spotting of blood in your stool or on the toilet paper. If you underwent a bowel prep for your procedure, then you may not have a normal bowel movement for a few days.  DIET: Your first meal following the procedure should be a light meal and then it is ok to progress to your normal diet.  A half-sandwich or bowl of soup is an example of a good first meal.  Heavy or fried foods are harder to digest and may make you feel nauseous or bloated.  Likewise meals heavy in dairy and vegetables can cause extra gas to form and this can also increase the bloating.  Drink plenty of fluids but you should avoid alcoholic beverages for 24 hours.  ACTIVITY: Your care partner should take you home directly after the procedure.  You should plan to take it easy, moving slowly for the rest of the day.  You can resume normal activity the day after the procedure however you should NOT DRIVE or use heavy machinery for 24 hours (because of the sedation medicines used during the test).    SYMPTOMS TO REPORT IMMEDIATELY: A gastroenterologist can be reached at any hour.  During normal business hours, 8:30 AM to 5:00 PM Monday through Friday,  call [phone].  After hours and on weekends, please call the GI answering service at [phone] who will take a message and have the physician on call contact you.    Following upper endoscopy (EGD)  Vomiting of blood or coffee ground material  New chest pain or pain under the shoulder blades  Painful or persistently difficult swallowing  New shortness of breath  Fever of 100F or higher  Black, tarry-looking stools  FOLLOW UP: If any biopsies were taken you will be contacted by phone or by letter within the next 1-3 weeks.  Call your gastroenterologist if you have not heard about the biopsies in 3 weeks.  Our staff will call the home number listed on your records the next business day following your procedure to check on you and address any questions or concerns that you may have at that time regarding the information given to you following your procedure. This is a courtesy call and so if there is no answer at the home number and we have not heard from you through the emergency physician on call, we will assume that you have returned to your regular daily activities without incident.  SIGNATURES/CONFIDENTIALITY: You and/or your care partner have signed paperwork which will be entered into your electronic medical record.  These signatures attest to the fact that that the information above on your After Visit Summary has been reviewed and is understood.  Full   responsibility of the confidentiality of this discharge information lies with you and/or your care-partner.   Resume medications. Information given on GERD with discharge instructions. 

## 2013-05-11 NOTE — Progress Notes (Signed)
  [facility] Post-op Note  Patient: [name]  Procedure(s) Performed: endoscopy  Patient Location: [facility] - Recovery Area  Anesthesia Type: Deep Sedation/Propofol  Level of Consciousness: awake, oriented and patient cooperative  Airway and Oxygen Therapy: Patient Spontanous Breathing  Post-op Pain: none  Post-op Assessment:  Post-op Vital signs reviewed, Patient's Cardiovascular Status Stable, Respiratory Function Stable, Patent Airway, No signs of Nausea or vomiting and Pain level controlled  Post-op Vital Signs: Reviewed and stable  Complications: No apparent anesthesia complications and  Patient required ambu bag during procedure for low sats, easily bagged w/100%O2, sats up to 90's.   Rosezetta Balderston E Kelcee Bjorn 1:52 PM

## 2013-05-12 ENCOUNTER — Telehealth: Payer: Self-pay | Admitting: *Deleted

## 2013-05-12 ENCOUNTER — Encounter: Payer: Self-pay | Admitting: Gastroenterology

## 2013-05-12 ENCOUNTER — Encounter: Payer: Self-pay | Admitting: Family Medicine

## 2013-05-12 ENCOUNTER — Ambulatory Visit (INDEPENDENT_AMBULATORY_CARE_PROVIDER_SITE_OTHER): Payer: 59 | Admitting: Family Medicine

## 2013-05-12 VITALS — BP 136/78 | HR 74 | Temp 98.3°F | Wt 229.0 lb

## 2013-05-12 DIAGNOSIS — E279 Disorder of adrenal gland, unspecified: Secondary | ICD-10-CM

## 2013-05-12 DIAGNOSIS — K760 Fatty (change of) liver, not elsewhere classified: Secondary | ICD-10-CM

## 2013-05-12 DIAGNOSIS — E278 Other specified disorders of adrenal gland: Secondary | ICD-10-CM

## 2013-05-12 DIAGNOSIS — R1012 Left upper quadrant pain: Secondary | ICD-10-CM

## 2013-05-12 DIAGNOSIS — H1130 Conjunctival hemorrhage, unspecified eye: Secondary | ICD-10-CM

## 2013-05-12 DIAGNOSIS — K7689 Other specified diseases of liver: Secondary | ICD-10-CM

## 2013-05-12 DIAGNOSIS — R233 Spontaneous ecchymoses: Secondary | ICD-10-CM

## 2013-05-12 HISTORY — DX: Other specified disorders of adrenal gland: E27.8

## 2013-05-12 NOTE — Progress Notes (Signed)
Subjective:    Patient ID: [name], female    DOB: 06/22/1955, 58 y.o.   MRN: [id number]  HPI Patient seen [phone] with colicky somewhat intermittent pain left upper quadrant exacerbated after eating. Also about 2 weeks ago. reevaluation regarding recent left upper quadrant abdominal pain and decreased appetite. She was seen in emergency department.  She had several labs were unremarkable. Urinalysis unremarkable. 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 There is also comment of descending and sigmoid colon diverticulosis without diverticulitis.  Patient had EGD yesterday that did not show any significant abnormalities. She has a small hiatal hernia. She called back later in the day with some redness involving the face and small subconjunctival hemorrhages. She's had some minimal swelling of the lower lip. Patient states that her oxygen levels dropped during procedure yesterday and that she had some supplemental oxygen.  Patient placed on omeprazole. Regarding her pain this radiates toward the back region from the mid thoracic area. She has not had any recent skin rashes. Her pain does seem to be worse after eating. She describes this as dull and aching and sometimes sharp. Sometimes fairly intense. She does have history of known lumbar stenosis and she thinks orthopedist may obtain and MRI of thoracic spine in recent months but we are not sure. The record that. She's had only minimal weight loss recently.  Fatty liver changes as above. She does not use alcohol.  Past Medical History  Diagnosis Date  . Anemia   . Atrial fibrillation   . Headache(784.0)   . Hypertension   . GERD (gastroesophageal reflux disease)   . Gastritis   . Fatty liver   . Diverticulosis   . Internal hemorrhoids   . Depression   . OSA (obstructive sleep apnea)   . Asthma   . Adenomatous colon polyp 05/2002  .  Hemorrhoids   . Spinal stenosis   . Allergy    Past Surgical History  Procedure Laterality Date  . Partial hysterectomy    . Appendectomy    . Breast reduction surgery    . Total abdominal hysterectomy w/ bilateral salpingoophorectomy    . Cervical fusion    . Bunionectomy      reports that she has never smoked. She has never used smokeless tobacco. She reports that she does not drink alcohol or use illicit drugs. family history includes Coronary artery disease in her sister; Diabetes in her mother; Hypertension in her mother and sister. There is no history of Colon cancer. Allergies  Allergen Reactions  . Tramadol     GI upset, "I felt like I was in a fog"      Review of Systems  Constitutional: Positive for appetite change. Negative for fever, chills, fatigue and unexpected weight change.  Respiratory: Negative for cough and shortness of breath.   Cardiovascular: Negative for chest pain, palpitations and leg swelling.  Gastrointestinal: Positive for abdominal pain.  Genitourinary: Negative for dysuria.  Musculoskeletal: Positive for back pain.  Skin: Positive for rash.  Neurological: Negative for dizziness and syncope.       Objective:   Physical Exam  Constitutional: She appears well-developed and well-nourished.  HENT:  Patient has a small area of ecchymosis left posterior pharynx just anterior to the tonsillar region.  Eyes:  Patient has small bilateral subconjunctival hemorrhages. Otherwise eye exam normal  Cardiovascular: Normal rate.   Pulmonary/Chest: Effort normal and breath sounds  normal. No respiratory distress. She has no wheezes. She has no rales.  Abdominal: Soft. Bowel sounds are normal. She exhibits no distension and no mass. There is no tenderness. There is no rebound and no guarding.  Skin:   the patient has diffuse erythema involving face and anterior neck region. No further magnification these are small petechiae. She does not have any raised  urticarial lesions          Assessment & Plan:  #1 persistent left upper quadrant and left flank pain with extensive workup as above. Suspect this may be more radicular in nature. She will followup with her orthopedist Dr. Nelva Bush. We need to clarify whether she's had previous MRI thoracic spine and if not consider pursuing that #2 petechiae involving face and neck and subconjunctival hemorrhages. Patient is reassured that these will likely resolve quickly over several days. Suspect related to straining from yesterday. #3 fatty liver changes on recent CT. Patient is aware that only real treatment is weight loss. We have referred her to pilot program through local [employer] for assisting with weight loss and reassess one month #4 probably incidental left adrenal myolipoma. Consider repeat imaging in 6 months to reassess

## 2013-05-12 NOTE — Patient Instructions (Addendum)
Fatty Liver Fatty liver is the accumulation of fat in liver cells. It is also called hepatosteatosis or steatohepatitis. It is normal for your liver to contain some fat. If fat is more than 5 to 10% of your liver's weight, you have fatty liver.  There are often no symptoms (problems) for years while damage is still occurring. People often learn about their fatty liver when they have medical tests for other reasons. Fat can damage your liver for years or even decades without causing problems. When it becomes severe, it can cause fatigue, weight loss, weakness, and confusion. This makes you more likely to develop more serious liver problems. The liver is the largest organ in the body. It does a lot of work and often gives no warning signs when it is sick until late in a disease. The liver has many important jobs including:  Breaking down foods.  Storing vitamins, iron, and other minerals.  Making proteins.  Making bile for food digestion.  Breaking down many products including medications, alcohol and some poisons. CAUSES  There are a number of different conditions, medications, and poisons that can cause a fatty liver. Eating too many calories causes fat to build up in the liver. Not processing and breaking fats down normally may also cause this. Certain conditions, such as obesity, diabetes, and high triglycerides also cause this. Most fatty liver patients tend to be middle-aged and over weight.  Some causes of fatty liver are:  Alcohol over consumption.  Malnutrition.  Steroid use.  Valproic acid toxicity.  Obesity.  Cushing's syndrome.  Poisons.  Tetracycline in high dosages.  Pregnancy.  Diabetes.  Hyperlipidemia.  Rapid weight loss. Some people develop fatty liver even having none of these conditions. SYMPTOMS  Fatty liver most often causes no problems. This is called asymptomatic.  It can be diagnosed with blood tests and also by a liver biopsy.  It is one of the  most common causes of minor elevations of liver enzymes on routine blood tests.  Specialized Imaging of the liver using ultrasound, CT (computed tomography) scan, or MRI (magnetic resonance imaging) can suggest a fatty liver but a biopsy is needed to confirm it.  A biopsy involves taking a small sample of liver tissue. This is done by using a needle. It is then looked at under a microscope by a specialist. TREATMENT  It is important to treat the cause. Simple fatty liver without a medical reason may not need treatment.  Weight loss, fat restriction, and exercise in overweight patients produces inconsistent results but is worth trying.  Fatty liver due to alcohol toxicity may not improve even with stopping drinking.  Good control of diabetes may reduce fatty liver.  Lower your triglycerides through diet, medication or both.  Eat a balanced, healthy diet.  Increase your physical activity.  Get regular checkups from a liver specialist.  There are no medical or surgical treatments for a fatty liver or NASH, but improving your diet and increasing your exercise may help prevent or reverse some of the damage. PROGNOSIS  Fatty liver may cause no damage or it can lead to an inflammation of the liver. This is, called steatohepatitis. When it is linked to alcohol abuse, it is called alcoholic steatohepatitis. It often is not linked to alcohol. It is then called nonalcoholic steatohepatitis, or NASH. Over time the liver may become scarred and hardened. This condition is called cirrhosis. Cirrhosis is serious and may lead to liver failure or cancer. NASH is one of the  leading causes of cirrhosis. About 10-20% of Americans have fatty liver and a smaller 2-5% has NASH. Document Released: 02/07/2005 Document Revised: 03/17/2011 Document Reviewed: 04/02/2005 [facility] Patient Information 2014 [place].  Schedule follow up with Dr Nelva Bush. You have facial petechiae from facial straining and these  should fade within days.

## 2013-05-12 NOTE — Telephone Encounter (Signed)
  Follow up Call-  Call back number 05/11/2013 12/09/2012  Post procedure Call Back phone  # [phone]  Permission to leave phone message Yes Yes     Patient questions:  Do you have a fever, pain , or abdominal swelling? no Pain Score  0 *  Have you tolerated food without any problems? yes  Have you been able to return to your normal activities? yes  Do you have any questions about your discharge instructions: Diet   no Medications  no Follow up visit  no  Do you have questions or concerns about your Care? no  Actions: * If pain score is 4 or above:Pt did say she had to call Dr. Fuller Plan last night because she had redness neck and throat/chest area. She said she was told by CRNA and Dr. Fuller Plan yesterday that she had some trouble breathing with anesthesia yesterday and had to have some type of tube to help her so they had told her she may have some redness and soreness' which she does. Pt said Dr. Fuller Plan told her she needed to see her PCP for this so she has an appointment with Dr. Elease Hashimoto today @ 10 am. No action needed, pain <4.

## 2013-05-12 NOTE — Progress Notes (Signed)
Pre visit review using our clinic review tool, if applicable. No additional management support is needed unless otherwise documented below in the visit note. 

## 2013-05-18 ENCOUNTER — Ambulatory Visit: Payer: 59 | Admitting: Gastroenterology

## 2013-06-23 DIAGNOSIS — L669 Cicatricial alopecia, unspecified: Secondary | ICD-10-CM

## 2013-06-23 DIAGNOSIS — L668 Other cicatricial alopecia: Secondary | ICD-10-CM

## 2013-06-23 DIAGNOSIS — L6681 Central centrifugal cicatricial alopecia: Secondary | ICD-10-CM

## 2013-06-23 HISTORY — DX: Cicatricial alopecia, unspecified: L66.9

## 2013-06-23 HISTORY — DX: Central centrifugal cicatricial alopecia: L66.81

## 2013-07-14 ENCOUNTER — Encounter: Payer: Self-pay | Admitting: Family Medicine

## 2013-07-14 ENCOUNTER — Ambulatory Visit (INDEPENDENT_AMBULATORY_CARE_PROVIDER_SITE_OTHER): Payer: 59 | Admitting: Family Medicine

## 2013-07-14 VITALS — BP 132/78 | HR 76 | Wt 218.0 lb

## 2013-07-14 DIAGNOSIS — R600 Localized edema: Secondary | ICD-10-CM

## 2013-07-14 DIAGNOSIS — I10 Essential (primary) hypertension: Secondary | ICD-10-CM

## 2013-07-14 DIAGNOSIS — E669 Obesity, unspecified: Secondary | ICD-10-CM

## 2013-07-14 DIAGNOSIS — E279 Disorder of adrenal gland, unspecified: Secondary | ICD-10-CM

## 2013-07-14 DIAGNOSIS — K7689 Other specified diseases of liver: Secondary | ICD-10-CM

## 2013-07-14 DIAGNOSIS — E278 Other specified disorders of adrenal gland: Secondary | ICD-10-CM

## 2013-07-14 DIAGNOSIS — R609 Edema, unspecified: Secondary | ICD-10-CM

## 2013-07-14 NOTE — Progress Notes (Signed)
Subjective:    Patient ID: [name], female    DOB: December 23, 1955, 58 y.o.   MRN: [id number]  HPI Patient seen for the following issues  Right leg edema greater than left. This has been very chronic. No history of right lower extremity surgery. She recently had seen trainer who suggested she have this evaluated. Denies any calf pain. No dyspnea. She states she's had some right greater than left edema for many years. Worse with dependency. Also worse late in the day. Is improved with elevation. Not using compression garments.  Obesity. She has started Weight loss program and has made some serious lifestyle changes. She's working with a Physiological scientist. She has already lost about 20 pounds. She feels much better overall.  Fatty liver changes by recent CT with normal liver transaminases. We discussed with her the only real treatment is weight loss. She also had incidental note of left adrenal myolipoma which have increased in size from prior studies. She is interested in getting this reevaluated to make sure this is not increasing. She has good appetite.  Hypertension which is stable on current medications. No orthostasis.  Past Medical History  Diagnosis Date  . Anemia   . Atrial fibrillation   . Headache(784.0)   . Hypertension   . GERD (gastroesophageal reflux disease)   . Gastritis   . Fatty liver   . Diverticulosis   . Internal hemorrhoids   . Depression   . OSA (obstructive sleep apnea)   . Asthma   . Adenomatous colon polyp 05/2002  . Hemorrhoids   . Spinal stenosis   . Allergy    Past Surgical History  Procedure Laterality Date  . Partial hysterectomy    . Appendectomy    . Breast reduction surgery    . Total abdominal hysterectomy w/ bilateral salpingoophorectomy    . Cervical fusion    . Bunionectomy      reports that she has never smoked. She has never used smokeless tobacco. She reports that she does not drink alcohol or use illicit drugs. family history  includes Coronary artery disease in her sister; Diabetes in her mother; Hypertension in her mother and sister. There is no history of Colon cancer. Allergies  Allergen Reactions  . Tramadol     GI upset, "I felt like I was in a fog"      Review of Systems  Constitutional: Negative for fatigue.  Eyes: Negative for visual disturbance.  Respiratory: Negative for cough, chest tightness, shortness of breath and wheezing.   Cardiovascular: Positive for leg swelling. Negative for chest pain and palpitations.  Neurological: Negative for dizziness, seizures, syncope, weakness, light-headedness and headaches.       Objective:   Physical Exam  Constitutional: She appears well-developed and well-nourished.  Neck: Neck supple. No thyromegaly present.  Cardiovascular: Normal rate and regular rhythm.  Exam reveals no gallop.   Pulmonary/Chest: Effort normal and breath sounds normal. No respiratory distress. She has no wheezes. She has no rales.  Musculoskeletal: She exhibits edema.  Trace pitting edema both lower legs somewhat greater than the right side. No calf tenderness. No color changes. Both feet are warm to touch with 2+ dorsalis pedis pulses.  Lymphadenopathy:    She has no cervical adenopathy.          Assessment & Plan:  #1 mild asymmetric right lower extremity edema compared to left. No clinical suspicion of DVT. This has been chronic. Suspect mostly venous stasis related. We discussed possible compression  garments and she's not interested. Continue frequent elevation and regular exercise and weight loss #2 obesity. Great success recently with lifestyle changes. She has goal of getting her weight down to 160 pounds or less #3 hypertension which is stable #4 fatty liver changes by recent ultrasound. This should improve with weight loss #5 benign appearing left adrenal myolipoma by recent CT scan. Set up repeat ultrasound in 3-4 months to reevaluate

## 2013-07-14 NOTE — Progress Notes (Signed)
Pre visit review using our clinic review tool, if applicable. No additional management support is needed unless otherwise documented below in the visit note. 

## 2013-07-14 NOTE — Patient Instructions (Signed)
Elevate legs frequently. Continue weight loss efforts. We will set up ultrasound for October.

## 2013-09-06 ENCOUNTER — Encounter: Payer: Self-pay | Admitting: Gastroenterology

## 2013-11-01 ENCOUNTER — Other Ambulatory Visit: Payer: 59

## 2013-12-19 ENCOUNTER — Encounter: Payer: Self-pay | Admitting: Family Medicine

## 2013-12-19 ENCOUNTER — Ambulatory Visit (INDEPENDENT_AMBULATORY_CARE_PROVIDER_SITE_OTHER): Payer: 59 | Admitting: Family Medicine

## 2013-12-19 VITALS — BP 138/78 | HR 68 | Temp 97.9°F | Wt 223.0 lb

## 2013-12-19 DIAGNOSIS — R350 Frequency of micturition: Secondary | ICD-10-CM

## 2013-12-19 DIAGNOSIS — R102 Pelvic and perineal pain: Secondary | ICD-10-CM

## 2013-12-19 LAB — URINALYSIS, MICROSCOPIC ONLY: WBC, UA: NONE SEEN (ref 0–?)

## 2013-12-19 LAB — POCT URINALYSIS DIPSTICK
Bilirubin, UA: NEGATIVE
Glucose, UA: NEGATIVE
Ketones, UA: NEGATIVE
Leukocytes, UA: NEGATIVE
Nitrite, UA: NEGATIVE
Protein, UA: NEGATIVE
Spec Grav, UA: 1.01
Urobilinogen, UA: 0.2
pH, UA: 6

## 2013-12-19 NOTE — Progress Notes (Signed)
   Subjective:    Patient ID: [name], female    DOB: 10/10/1955, 58 y.o.   MRN: [id number]  HPI  Patient seen with approximately one-week history of some lower pelvic pain and urine frequency. Her symptoms have been worse at night. She's had some nonspecific malaise. She has had previous total abdominal hysterectomy. She actually saw her gynecologist last week and had internal exam which was unremarkable. She's had urine frequency but no burning with urination. No fevers or chills. No vaginal bleeding. No history of kidney stones. No change in bowel habits. She describes pain as achy quality worse with lying down and changing positions. Denies any sort of injury. She has had some occasional left lumbar back pain but no classic radiculopathy symptoms.  Colonoscopy last year unremarkable. She's taken Tylenol which seems to help somewhat.  Past Medical History  Diagnosis Date  . Anemia   . Atrial fibrillation   . Headache(784.0)   . Hypertension   . GERD (gastroesophageal reflux disease)   . Gastritis   . Fatty liver   . Diverticulosis   . Internal hemorrhoids   . Depression   . OSA (obstructive sleep apnea)   . Asthma   . Adenomatous colon polyp 05/2002  . Hemorrhoids   . Spinal stenosis   . Allergy    Past Surgical History  Procedure Laterality Date  . Partial hysterectomy    . Appendectomy    . Breast reduction surgery    . Total abdominal hysterectomy w/ bilateral salpingoophorectomy    . Cervical fusion    . Bunionectomy      reports that she has never smoked. She has never used smokeless tobacco. She reports that she does not drink alcohol or use illicit drugs. family history includes Coronary artery disease in her sister; Diabetes in her mother; Hypertension in her mother and sister. There is no history of Colon cancer. Allergies  Allergen Reactions  . Tramadol     GI upset, "I felt like I was in a fog"     Review of Systems  Constitutional: Positive for  fatigue. Negative for fever and chills.  Gastrointestinal: Negative for abdominal pain.  Genitourinary: Positive for frequency and pelvic pain. Negative for dysuria and flank pain.       Objective:   Physical Exam  Constitutional: She appears well-developed and well-nourished.  Cardiovascular: Normal rate, regular rhythm, normal heart sounds and intact distal pulses.   Pulmonary/Chest: Effort normal and breath sounds normal. No respiratory distress. She has no wheezes. She has no rales.  Abdominal: Soft. Bowel sounds are normal. She exhibits no distension. There is no tenderness. There is no rebound and no guarding.          Assessment & Plan:  Patient seen with nonspecific urine frequency and some suprapubic lower pelvic pain for the past week. She has had previous total abdominal hysterectomy and had GYN checkup last week unrevealing. Question lower oblique strain. We have recommended low-grade heat and continue Tylenol as needed. Consider pelvic x-rays if not improving over the next week. Pelvic ultrasound would not be very useful with prior total abdominal hysterectomy.

## 2013-12-19 NOTE — Patient Instructions (Signed)
Pelvic Pain Female pelvic pain can be caused by many different things and start from a variety of places. Pelvic pain refers to pain that is located in the lower half of the abdomen and between your hips. The pain may occur over a short period of time (acute) or may be reoccurring (chronic). The cause of pelvic pain may be related to disorders affecting the female reproductive organs (gynecologic), but it may also be related to the bladder, kidney stones, an intestinal complication, or muscle or skeletal problems. Getting help right away for pelvic pain is important, especially if there has been severe, sharp, or a sudden onset of unusual pain. It is also important to get help right away because some types of pelvic pain can be life threatening.  CAUSES  Below are only some of the causes of pelvic pain. The causes of pelvic pain can be in one of several categories.   Gynecologic.  Pelvic inflammatory disease.  Sexually transmitted infection.  Ovarian cyst or a twisted ovarian ligament (ovarian torsion).  Uterine lining that grows outside the uterus (endometriosis).  Fibroids, cysts, or tumors.  Ovulation.  Pregnancy.  Pregnancy that occurs outside the uterus (ectopic pregnancy).  Miscarriage.  Labor.  Abruption of the placenta or ruptured uterus.  Infection.  Uterine infection (endometritis).  Bladder infection.  Diverticulitis.  Miscarriage related to a uterine infection (septic abortion).  Bladder.  Inflammation of the bladder (cystitis).  Kidney stone(s).  Gastrointestinal.  Constipation.  Diverticulitis.  Neurologic.  Trauma.  Feeling pelvic pain because of mental or emotional causes (psychosomatic).  Cancers of the bowel or pelvis. EVALUATION  Your caregiver will want to take a careful history of your concerns. This includes recent changes in your health, a careful gynecologic history of your periods (menses), and a sexual history. Obtaining your family  history and medical history is also important. Your caregiver may suggest a pelvic exam. A pelvic exam will help identify the location and severity of the pain. It also helps in the evaluation of which organ system may be involved. In order to identify the cause of the pelvic pain and be properly treated, your caregiver may order tests. These tests may include:   A pregnancy test.  Pelvic ultrasonography.  An X-ray exam of the abdomen.  A urinalysis or evaluation of vaginal discharge.  Blood tests. HOME CARE INSTRUCTIONS   Only take over-the-counter or prescription medicines for pain, discomfort, or fever as directed by your caregiver.   Rest as directed by your caregiver.   Eat a balanced diet.   Drink enough fluids to make your urine clear or pale yellow, or as directed.   Avoid sexual intercourse if it causes pain.   Apply warm or cold compresses to the lower abdomen depending on which one helps the pain.   Avoid stressful situations.   Keep a journal of your pelvic pain. Write down when it started, where the pain is located, and if there are things that seem to be associated with the pain, such as food or your menstrual cycle.  Follow up with your caregiver as directed.  SEEK MEDICAL CARE IF:  Your medicine does not help your pain.  You have abnormal vaginal discharge. SEEK IMMEDIATE MEDICAL CARE IF:   You have heavy bleeding from the vagina.   Your pelvic pain increases.   You feel light-headed or faint.   You have chills.   You have pain with urination or blood in your urine.   You have uncontrolled diarrhea   or vomiting.   You have a fever or persistent symptoms for more than 3 days.  You have a fever and your symptoms suddenly get worse.   You are being physically or sexually abused.  MAKE SURE YOU:  Understand these instructions.  Will watch your condition.  Will get help if you are not doing well or get worse. Document Released:  11/20/2003 Document Revised: 05/09/2013 Document Reviewed: 04/14/2011 [employer] Patient Information 2015 [employer]. This information is not intended to replace advice given to you by your health care provider. Make sure you discuss any questions you have with your health care provider.  

## 2013-12-19 NOTE — Progress Notes (Signed)
Pre visit review using our clinic review tool, if applicable. No additional management support is needed unless otherwise documented below in the visit note. 

## 2013-12-22 ENCOUNTER — Telehealth: Payer: Self-pay

## 2013-12-22 ENCOUNTER — Ambulatory Visit (INDEPENDENT_AMBULATORY_CARE_PROVIDER_SITE_OTHER)
Admission: RE | Admit: 2013-12-22 | Discharge: 2013-12-22 | Disposition: A | Discharge status: Home / Self Care | Payer: 59 | Source: Ambulatory Visit | Attending: Family Medicine | Admitting: Family Medicine

## 2013-12-22 DIAGNOSIS — R102 Pelvic and perineal pain: Secondary | ICD-10-CM

## 2013-12-22 MED ORDER — TRAMADOL HCL 50 MG PO TABS: ORAL_TABLET | ORAL | Status: DC

## 2013-12-22 NOTE — Telephone Encounter (Signed)
Tramadol 50 mg 1-2 po q 6 hours prn #60 with no refills.

## 2013-12-22 NOTE — Telephone Encounter (Signed)
Pt wants pain medication. She is still having pain especially at night.

## 2013-12-22 NOTE — Telephone Encounter (Signed)
Rx called into pharmacy pt is aware.

## 2013-12-25 ENCOUNTER — Emergency Department (HOSPITAL_BASED_OUTPATIENT_CLINIC_OR_DEPARTMENT_OTHER)
Admission: EM | Admit: 2013-12-25 | Discharge: 2013-12-25 | Disposition: A | Discharge status: Home / Self Care | Payer: 59 | Attending: Emergency Medicine | Admitting: Emergency Medicine

## 2013-12-25 ENCOUNTER — Encounter (HOSPITAL_BASED_OUTPATIENT_CLINIC_OR_DEPARTMENT_OTHER): Payer: Self-pay

## 2013-12-25 ENCOUNTER — Emergency Department (HOSPITAL_BASED_OUTPATIENT_CLINIC_OR_DEPARTMENT_OTHER): Payer: 59

## 2013-12-25 DIAGNOSIS — Z862 Personal history of diseases of the blood and blood-forming organs and certain disorders involving the immune mechanism: Secondary | ICD-10-CM | POA: Diagnosis not present

## 2013-12-25 DIAGNOSIS — J45909 Unspecified asthma, uncomplicated: Secondary | ICD-10-CM | POA: Diagnosis not present

## 2013-12-25 DIAGNOSIS — I10 Essential (primary) hypertension: Secondary | ICD-10-CM | POA: Insufficient documentation

## 2013-12-25 DIAGNOSIS — S3991XA Unspecified injury of abdomen, initial encounter: Secondary | ICD-10-CM | POA: Diagnosis present

## 2013-12-25 DIAGNOSIS — S76219A Strain of adductor muscle, fascia and tendon of unspecified thigh, initial encounter: Secondary | ICD-10-CM

## 2013-12-25 DIAGNOSIS — Z79899 Other long term (current) drug therapy: Secondary | ICD-10-CM | POA: Diagnosis not present

## 2013-12-25 DIAGNOSIS — Z8669 Personal history of other diseases of the nervous system and sense organs: Secondary | ICD-10-CM | POA: Insufficient documentation

## 2013-12-25 DIAGNOSIS — Y939 Activity, unspecified: Secondary | ICD-10-CM | POA: Diagnosis not present

## 2013-12-25 DIAGNOSIS — Z7982 Long term (current) use of aspirin: Secondary | ICD-10-CM | POA: Diagnosis not present

## 2013-12-25 DIAGNOSIS — Y999 Unspecified external cause status: Secondary | ICD-10-CM | POA: Insufficient documentation

## 2013-12-25 DIAGNOSIS — K219 Gastro-esophageal reflux disease without esophagitis: Secondary | ICD-10-CM | POA: Insufficient documentation

## 2013-12-25 DIAGNOSIS — Z86010 Personal history of colonic polyps: Secondary | ICD-10-CM | POA: Diagnosis not present

## 2013-12-25 DIAGNOSIS — X58XXXA Exposure to other specified factors, initial encounter: Secondary | ICD-10-CM | POA: Insufficient documentation

## 2013-12-25 DIAGNOSIS — S39011A Strain of muscle, fascia and tendon of abdomen, initial encounter: Secondary | ICD-10-CM | POA: Diagnosis not present

## 2013-12-25 DIAGNOSIS — F329 Major depressive disorder, single episode, unspecified: Secondary | ICD-10-CM | POA: Insufficient documentation

## 2013-12-25 DIAGNOSIS — Y929 Unspecified place or not applicable: Secondary | ICD-10-CM | POA: Diagnosis not present

## 2013-12-25 LAB — CBC WITH DIFFERENTIAL/PLATELET
Basophils Absolute: 0 10*3/uL (ref 0.0–0.1)
Basophils Relative: 1 % (ref 0–1)
Eosinophils Absolute: 0.1 10*3/uL (ref 0.0–0.7)
Eosinophils Relative: 2 % (ref 0–5)
HCT: 40.2 % (ref 36.0–46.0)
Hemoglobin: 13.3 g/dL (ref 12.0–15.0)
Lymphocytes Relative: 47 % — ABNORMAL HIGH (ref 12–46)
Lymphs Abs: 2.5 10*3/uL (ref 0.7–4.0)
MCH: 32.6 pg (ref 26.0–34.0)
MCHC: 33.1 g/dL (ref 30.0–36.0)
MCV: 98.5 fL (ref 78.0–100.0)
Monocytes Absolute: 0.6 10*3/uL (ref 0.1–1.0)
Monocytes Relative: 11 % (ref 3–12)
Neutro Abs: 2 10*3/uL (ref 1.7–7.7)
Neutrophils Relative %: 39 % — ABNORMAL LOW (ref 43–77)
Platelets: 314 10*3/uL (ref 150–400)
RBC: 4.08 MIL/uL (ref 3.87–5.11)
RDW: 12.4 % (ref 11.5–15.5)
WBC: 5.1 10*3/uL (ref 4.0–10.5)

## 2013-12-25 LAB — URINALYSIS, ROUTINE W REFLEX MICROSCOPIC
Bilirubin Urine: NEGATIVE
Glucose, UA: NEGATIVE mg/dL
Ketones, ur: NEGATIVE mg/dL
Leukocytes, UA: NEGATIVE
Nitrite: NEGATIVE
Protein, ur: NEGATIVE mg/dL
Specific Gravity, Urine: 1.016 (ref 1.005–1.030)
Urobilinogen, UA: 0.2 mg/dL (ref 0.0–1.0)
pH: 6.5 (ref 5.0–8.0)

## 2013-12-25 LAB — COMPREHENSIVE METABOLIC PANEL
ALT: 20 U/L (ref 0–35)
AST: 20 U/L (ref 0–37)
Albumin: 4 g/dL (ref 3.5–5.2)
Alkaline Phosphatase: 65 U/L (ref 39–117)
Anion gap: 13 (ref 5–15)
BUN: 9 mg/dL (ref 6–23)
CO2: 27 mEq/L (ref 19–32)
Calcium: 9.6 mg/dL (ref 8.4–10.5)
Chloride: 103 mEq/L (ref 96–112)
Creatinine, Ser: 0.7 mg/dL (ref 0.50–1.10)
GFR calc Af Amer: >90 mL/min (ref >90)
GFR calc non Af Amer: >90 mL/min (ref >90)
Glucose, Bld: 90 mg/dL (ref 70–99)
Potassium: 4.3 mEq/L (ref 3.7–5.3)
Sodium: 143 mEq/L (ref 137–147)
Total Bilirubin: 0.4 mg/dL (ref 0.3–1.2)
Total Protein: 7.4 g/dL (ref 6.0–8.3)

## 2013-12-25 LAB — PREGNANCY, URINE: Preg Test, Ur: NEGATIVE

## 2013-12-25 LAB — LIPASE, BLOOD: Lipase: 22 U/L (ref 11–59)

## 2013-12-25 LAB — URINE MICROSCOPIC-ADD ON

## 2013-12-25 MED ORDER — IOHEXOL 300 MG/ML  SOLN
50.0000 mL | Freq: Once | INTRAMUSCULAR | Status: AC | PRN
  Administered 2013-12-25: 25 mL via ORAL

## 2013-12-25 MED ORDER — ONDANSETRON HCL 4 MG/2ML IJ SOLN
4.0000 mg | Freq: Once | INTRAMUSCULAR | Status: DC
  Filled 2013-12-25: qty 2

## 2013-12-25 MED ORDER — CYCLOBENZAPRINE HCL 10 MG PO TABS
5.0000 mg | ORAL_TABLET | Freq: Once | ORAL | Status: AC
  Administered 2013-12-25: 5 mg via ORAL
  Filled 2013-12-25: qty 1

## 2013-12-25 MED ORDER — CYCLOBENZAPRINE HCL 10 MG PO TABS: 10.0000 mg | ORAL_TABLET | Freq: Two times a day (BID) | ORAL | Status: DC | PRN

## 2013-12-25 MED ORDER — HYDROMORPHONE HCL 1 MG/ML IJ SOLN
0.5000 mg | Freq: Once | INTRAMUSCULAR | Status: DC
  Filled 2013-12-25: qty 1

## 2013-12-25 MED ORDER — IOHEXOL 300 MG/ML  SOLN
100.0000 mL | Freq: Once | INTRAMUSCULAR | Status: AC | PRN
  Administered 2013-12-25: 100 mL via INTRAVENOUS

## 2013-12-25 MED ORDER — SODIUM CHLORIDE 0.9 % IV SOLN
Freq: Once | INTRAVENOUS | Status: AC
  Administered 2013-12-25: 11:00:00 via INTRAVENOUS

## 2013-12-25 NOTE — Discharge Instructions (Signed)
Abdominal Pain Many things can cause abdominal pain. Usually, abdominal pain is not caused by a disease and will improve without treatment. It can often be observed and treated at home. Your health care provider will do a physical exam and possibly order blood tests and X-rays to help determine the seriousness of your pain. However, in many cases, more time must pass before a clear cause of the pain can be found. Before that point, your health care provider may not know if you need more testing or further treatment. HOME CARE INSTRUCTIONS  Monitor your abdominal pain for any changes. The following actions may help to alleviate any discomfort you are experiencing:  Only take over-the-counter or prescription medicines as directed by your health care provider.  Do not take laxatives unless directed to do so by your health care provider.  Try a clear liquid diet (broth, tea, or water) as directed by your health care provider. Slowly move to a bland diet as tolerated. SEEK MEDICAL CARE IF:  You have unexplained abdominal pain.  You have abdominal pain associated with nausea or diarrhea.  You have pain when you urinate or have a bowel movement.  You experience abdominal pain that wakes you in the night.  You have abdominal pain that is worsened or improved by eating food.  You have abdominal pain that is worsened with eating fatty foods.  You have a fever. SEEK IMMEDIATE MEDICAL CARE IF:   Your pain does not go away within 2 hours.  You keep throwing up (vomiting).  Your pain is felt only in portions of the abdomen, such as the right side or the left lower portion of the abdomen.  You pass bloody or black tarry stools. MAKE SURE YOU:  Understand these instructions.   Will watch your condition.   Will get help right away if you are not doing well or get worse.  Document Released: 10/02/2004 Document Revised: 12/28/2012 Document Reviewed: 09/01/2012 [facility] Patient Information  2015 [place], [place]. This information is not intended to replace advice given to you by your health care provider. Make sure you discuss any questions you have with your health care provider. Muscle Strain A muscle strain (pulled muscle) happens when a muscle is stretched beyond normal length. It happens when a sudden, violent force stretches your muscle too far. Usually, a few of the fibers in your muscle are torn. Muscle strain is common in athletes. Recovery usually takes 1-2 weeks. Complete healing takes 5-6 weeks.  HOME CARE   Follow the PRICE method of treatment to help your injury get better. Do this the first 2-3 days after the injury:  Protect. Protect the muscle to keep it from getting injured again.  Rest. Limit your activity and rest the injured body part.  Ice. Put ice in a plastic bag. Place a towel between your skin and the bag. Then, apply the ice and leave it on from 15-20 minutes each hour. After the third day, switch to moist heat packs.  Compression. Use a splint or elastic bandage on the injured area for comfort. Do not put it on too tightly.  Elevate. Keep the injured body part above the level of your heart.  Only take medicine as told by your doctor.  Warm up before doing exercise to prevent future muscle strains. GET HELP IF:   You have more pain or puffiness (swelling) in the injured area.  You feel numbness, tingling, or notice a loss of strength in the injured area. MAKE SURE  YOU:   Understand these instructions.  Will watch your condition.  Will get help right away if you are not doing well or get worse. Document Released: 10/02/2007 Document Revised: 10/13/2012 Document Reviewed: 07/22/2012 [facility] Patient Information 2015 [place], [place]. This information is not intended to replace advice given to you by your health care provider. Make sure you discuss any questions you have with your health care provider.

## 2013-12-25 NOTE — ED Notes (Signed)
Pt ambulatory to restroom

## 2013-12-25 NOTE — ED Notes (Signed)
D/c home with family- Rx x 1 given for flexeril

## 2013-12-25 NOTE — ED Notes (Signed)
Patient here with 2 weeks of lower abdominal pain, reports that she feels it worse when she pulls her legs up to her stomach. No nausea, no vomiting, no diarrhea

## 2013-12-25 NOTE — ED Notes (Signed)
Patient transported to CT 

## 2013-12-25 NOTE — ED Provider Notes (Signed)
CSN: [id number]     Arrival date & time 12/25/13  3151 History   First MD Initiated Contact with Patient 12/25/13 [phone]     Chief Complaint  Patient presents with  . Abdominal Pain     (Consider location/radiation/quality/duration/timing/severity/associated sxs/prior Treatment) HPI 58 year old female who comes in today complaining of some left lower abdominal pain that has been present for the past 2 weeks. She has been seen by her gynecologist and had a pelvic exam that is reported to me as normal. She was seen by her primary care physician had a urinalysis obtained. She reports that the pain has continued and describes it as Sharp and pulling in nature. It is worse when she moves in certain positions. She has not had nausea, vomiting, diarrhea, urinary tract infection symptoms, or abnormal vaginal discharge. Past Medical History  Diagnosis Date  . Anemia   . Atrial fibrillation   . Headache(784.0)   . Hypertension   . GERD (gastroesophageal reflux disease)   . Gastritis   . Fatty liver   . Diverticulosis   . Internal hemorrhoids   . Depression   . OSA (obstructive sleep apnea)   . Asthma   . Adenomatous colon polyp 05/2002  . Hemorrhoids   . Spinal stenosis   . Allergy    Past Surgical History  Procedure Laterality Date  . Partial hysterectomy    . Appendectomy    . Breast reduction surgery    . Total abdominal hysterectomy w/ bilateral salpingoophorectomy    . Cervical fusion    . Bunionectomy     Family History  Problem Relation Age of Onset  . Diabetes Mother   . Hypertension Mother   . Coronary artery disease Sister   . Hypertension Sister   . Colon cancer Neg Hx    History  Substance Use Topics  . Smoking status: Never Smoker   . Smokeless tobacco: Never Used  . Alcohol Use: No   OB History    No data available     Review of Systems  All other systems reviewed and are negative.     Allergies  Tramadol  Home Medications   Prior to Admission  medications   Medication Sig Start Date End Date Taking? Authorizing Provider  amLODipine-valsartan (EXFORGE) 5-160 MG per tablet TAKE 1 TABLET DAILY    [name], MD  aspirin 81 MG tablet Take 81 mg by mouth daily.    Historical Provider, MD  Calcium Carbonate-Vitamin D (CALCIUM 600+D) 600-400 MG-UNIT per tablet Take 1 tablet by mouth daily.      Historical Provider, MD  estradiol (VIVELLE-DOT) 0.05 MG/24HR Place 1 patch onto the skin 2 (two) times a week. Apply Sundays and Thursdays    Historical Provider, MD  fexofenadine (ALLEGRA) 180 MG tablet Take 180 mg by mouth daily as needed for allergies or rhinitis.    Historical Provider, MD  hydrochlorothiazide (MICROZIDE) 12.5 MG capsule TAKE 1 CAPSULE DAILY    [name], MD  Multiple Vitamin (MULTIVITAMIN) tablet Take 1 tablet by mouth daily.    Historical Provider, MD  Probiotic Product (SOLUBLE FIBER/PROBIOTICS PO) Take 1 tablet by mouth every morning.    Historical Provider, MD  traMADol (ULTRAM) 50 MG tablet Take 1-2 tablets every 6 hours as need for pain. 12/22/13   [name], MD   BP 151/78 mmHg  Pulse 70  Temp(Src) 97.8 F (36.6 C) (Oral)  Resp 18  SpO2 98% Physical Exam  Constitutional: She is oriented  to person, place, and time. She appears well-developed and well-nourished.  HENT:  Head: Normocephalic and atraumatic.  Right Ear: External ear normal.  Left Ear: External ear normal.  Nose: Nose normal.  Mouth/Throat: Oropharynx is clear and moist.  Eyes: Conjunctivae and EOM are normal. Pupils are equal, round, and reactive to light.  Neck: Normal range of motion. Neck supple.  Cardiovascular: Normal rate, regular rhythm, normal heart sounds and intact distal pulses.   Pulmonary/Chest: Effort normal and breath sounds normal.  Abdominal: Soft. Bowel sounds are normal. There is tenderness.    Musculoskeletal: Normal range of motion.  Neurological: She is alert and oriented to person, place, and time. She  has normal reflexes.  Skin: Skin is warm and dry.  Psychiatric: She has a normal mood and affect. Her behavior is normal. Judgment and thought content normal.  Nursing note and vitals reviewed.   ED Course  Procedures (including critical care time) Labs Review Labs Reviewed  URINALYSIS, ROUTINE W REFLEX MICROSCOPIC - Abnormal; Notable for the following:    APPearance CLOUDY (*)    Hgb urine dipstick SMALL (*)    All other components within normal limits  CBC WITH DIFFERENTIAL - Abnormal; Notable for the following:    Neutrophils Relative % 39 (*)    Lymphocytes Relative 47 (*)    All other components within normal limits  URINE MICROSCOPIC-ADD ON - Abnormal; Notable for the following:    Squamous Epithelial / LPF MANY (*)    Bacteria, UA FEW (*)    All other components within normal limits  COMPREHENSIVE METABOLIC PANEL  LIPASE, BLOOD  PREGNANCY, URINE  OCCULT BLOOD X 1 CARD TO LAB, STOOL    Imaging Review Ct Abdomen Pelvis W Contrast  12/25/2013   CLINICAL DATA:  Mid to low abdominal pain for 2 weeks, no nausea, vomiting, diarrhea  EXAM: CT ABDOMEN AND PELVIS WITH CONTRAST  TECHNIQUE: Multidetector CT imaging of the abdomen and pelvis was performed using the standard protocol following bolus administration of intravenous contrast.  CONTRAST:  132mL OMNIPAQUE IOHEXOL 300 MG/ML SOLN, 30mL OMNIPAQUE IOHEXOL 300 MG/ML SOLN  COMPARISON:  None.  FINDINGS: The lung bases are clear.  The liver is diffusely low in attenuation likely secondary to hepatic steatosis. There is no intrahepatic or extrahepatic biliary ductal dilatation. The gallbladder is normal. The spleen demonstrates no focal abnormality.There is a 8 mm hypodensity in the posterior interpolar right kidney which is too small to characterize, although statistically represents a cyst. The left kidney, adrenal glands and pancreas are normal. The bladder is unremarkable.  The stomach, duodenum, small intestine, and large intestine  demonstrate no contrast extravasation or dilatation. There is a normal caliber appendix in the right lower quadrant without periappendiceal inflammatory changes.There is diverticulosis without evidence of diverticulitis. There is a small fat containing umbilical hernia. There is no pneumoperitoneum, pneumatosis, or portal venous gas. There is no abdominal or pelvic free fluid. There is no lymphadenopathy.  The abdominal aorta is normal in caliber .  There are no lytic or sclerotic osseous lesions. Bilateral facet arthropathy L3-4, L4-5 and L5-S1.  IMPRESSION: 1. No acute abdominal or pelvic pathology. 2. Hepatic steatosis. 3. Bilateral facet arthropathy at L3-4, L4-5 and L5-S1.   Electronically Signed   By: Kathreen Devoid   On: 12/25/2013 11:21     EKG Interpretation None      MDM   Final diagnoses:  Groin strain, initial encounter    Patient with some left lower abdomen/groin pain with  palpation present for several weeks. CT here did not reveal any evidence of diverticulitis. This is obtained as patient has pain in the left lower quadrant and has a history of diverticulosis. Labs here are normal. This is consistent with some contamination should but does not appear to have an acute UTI. I have discussed return precautions with patient and need for all follow-up and she voices understanding.    Shaune Pollack, MD 12/25/13 [phone]

## 2014-01-27 ENCOUNTER — Other Ambulatory Visit: Payer: Self-pay | Admitting: Family Medicine

## 2014-02-13 ENCOUNTER — Ambulatory Visit (INDEPENDENT_AMBULATORY_CARE_PROVIDER_SITE_OTHER): Payer: 59 | Admitting: Family Medicine

## 2014-02-13 ENCOUNTER — Encounter: Payer: Self-pay | Admitting: Family Medicine

## 2014-02-13 VITALS — BP 134/80 | HR 87 | Temp 97.6°F | Wt 225.0 lb

## 2014-02-13 DIAGNOSIS — K429 Umbilical hernia without obstruction or gangrene: Secondary | ICD-10-CM

## 2014-02-13 DIAGNOSIS — R1033 Periumbilical pain: Secondary | ICD-10-CM

## 2014-02-13 NOTE — Progress Notes (Signed)
   Subjective:    Patient ID: [name], female    DOB: [birth year], 57 y.o.   MRN: [id number]  HPI Patient seen with persistent abdominal/ pelvic pain. She is seen multiple individuals for this. Onset sometime in December. She saw her GYN initially and had internal exam which was unremarkable. Previous hysterectomy. She had recent colonoscopy 2014. Diverticulosis but no history of diverticulitis. She went to ED back in December and had CT abdomen pelvis which showed small umbilical hernia but no acute findings. She is seen orthopedist and had injection in her left hip without improvement. Her CT did show some degenerative changes lumbar spine. She has pain which is very poorly localized somewhat periumbilical but seems to radiate toward the vagina and pelvic region. She states her pain is worse with lying down and getting up. She denies any specific injury. Achy quality. No fevers or chills. She's had some recent weight gain. No dysuria  Past Medical History  Diagnosis Date  . Anemia   . Atrial fibrillation   . Headache(784.0)   . Hypertension   . GERD (gastroesophageal reflux disease)   . Gastritis   . Fatty liver   . Diverticulosis   . Internal hemorrhoids   . Depression   . OSA (obstructive sleep apnea)   . Asthma   . Adenomatous colon polyp 05/2002  . Hemorrhoids   . Spinal stenosis   . Allergy    Past Surgical History  Procedure Laterality Date  . Partial hysterectomy    . Appendectomy    . Breast reduction surgery    . Total abdominal hysterectomy w/ bilateral salpingoophorectomy    . Cervical fusion    . Bunionectomy      reports that she has never smoked. She has never used smokeless tobacco. She reports that she does not drink alcohol or use illicit drugs. family history includes Coronary artery disease in her sister; Diabetes in her mother; Hypertension in her mother and sister. There is no history of Colon cancer. Allergies  Allergen Reactions  . Tramadol    GI upset, "I felt like I was in a fog"      Review of Systems  Constitutional: Negative for fever, chills, appetite change and unexpected weight change.  Respiratory: Negative for cough and shortness of breath.   Cardiovascular: Negative for chest pain.  Gastrointestinal: Positive for abdominal pain. Negative for nausea, vomiting, diarrhea, constipation, blood in stool and abdominal distention.  Genitourinary: Negative for dysuria.       Objective:   Physical Exam  Constitutional: She appears well-developed and well-nourished. No distress.  Cardiovascular: Normal rate and regular rhythm.   Pulmonary/Chest: Effort normal and breath sounds normal. No respiratory distress. She has no wheezes. She has no rales.  Abdominal: Soft. Bowel sounds are normal.  Patient does have some periumbilical tenderness and no obvious swelling or mass. No overlying skin changes          Assessment & Plan:  Persistent poorly localized lower abdominal and pelvic pain. Extensive workup as above. Recent CT showed fat-containing umbilical hernia. She does have some periumbilical tenderness. No evidence for acute strangulation. Set up referral to general surgeon to get their opinion regarding her umbilical hernia.

## 2014-02-13 NOTE — Progress Notes (Signed)
Pre visit review using our clinic review tool, if applicable. No additional management support is needed unless otherwise documented below in the visit note. 

## 2014-02-13 NOTE — Patient Instructions (Signed)
We will call you with surgical referral.

## 2014-07-04 ENCOUNTER — Other Ambulatory Visit: Payer: Self-pay | Admitting: Family Medicine

## 2014-07-17 ENCOUNTER — Other Ambulatory Visit (HOSPITAL_COMMUNITY): Payer: Self-pay | Admitting: Obstetrics and Gynecology

## 2014-07-17 DIAGNOSIS — Z1231 Encounter for screening mammogram for malignant neoplasm of breast: Secondary | ICD-10-CM

## 2014-07-21 ENCOUNTER — Ambulatory Visit (HOSPITAL_COMMUNITY)
Admission: RE | Admit: 2014-07-21 | Discharge: 2014-07-21 | Disposition: A | Discharge status: Home / Self Care | Payer: 59 | Source: Ambulatory Visit | Attending: Obstetrics and Gynecology | Admitting: Obstetrics and Gynecology

## 2014-07-21 DIAGNOSIS — Z1231 Encounter for screening mammogram for malignant neoplasm of breast: Secondary | ICD-10-CM

## 2014-08-17 ENCOUNTER — Other Ambulatory Visit: Payer: Self-pay | Admitting: Family Medicine

## 2014-08-18 ENCOUNTER — Telehealth: Payer: Self-pay | Admitting: Family Medicine

## 2014-08-18 MED ORDER — AMLODIPINE BESYLATE-VALSARTAN 5-160 MG PO TABS: 1.0000 | ORAL_TABLET | Freq: Every day | ORAL | Status: DC

## 2014-08-18 NOTE — Telephone Encounter (Signed)
Rx sent to mail order

## 2014-08-18 NOTE — Telephone Encounter (Signed)
Pt request refill of the following: amLODipine-valsartan (EXFORGE) 5-160 MG per tablet     [facility]

## 2014-09-07 ENCOUNTER — Other Ambulatory Visit: Payer: Self-pay | Admitting: Neurological Surgery

## 2014-09-18 ENCOUNTER — Encounter (HOSPITAL_COMMUNITY): Payer: Self-pay

## 2014-09-18 NOTE — Progress Notes (Addendum)
Anesthesia Note: Patient is a 59 year old female posted for 2 level PLIF on 09/27/14 by Dr. Ronnald Ramp.  PAT is scheduled for 09/19/14.  History includes DIFFICULT INTUBATION, non-smoker, anemia, PAF (several episodes 2005-2007; saw Dr. Johnsie Cancel and EP cardiologist Dr. Lovena Le), HTN, GERD, OSA, asthma, depression, fatty liver, breast reduction '02, appendectomy, BSO with partial omentectomy 04/09/04, hysterectomy, cervical fusion, obesity. PCP is listed as Dr. Carolann Littler. Recently, she has not been followed by cardiology on a regular basis, but has been seen periodically dating back to 2005 for single episode of PAF and later for chest pain (normal coronaries in 2010). She was last seen by Dr. Sherren Mocha on 04/20/12 for palpitations. He suggested metoprolol daily or PRN if her palpitations persisted.  She was in SR at that time.  04/09/04 Anesthesia records received from [facility] (for [facility] According to Dr. Lennox Grumbles records, difficult intubation was unpredicted. Reasons for difficultly included reduced neck mobility and immobile epiglottis. DVL X 3 (CRNA) and then X 1 (MDA) with Sabra Heck 2--cords were visualized but ETT could not be passed. No cord visualization with MAC 3. Bag/mask ventilation was easy. Patient's airway was ultimately secured asleep with intubating LMA. (Five attempts total at intubation.) Future recommendations included induction with short acting agent and alternative techniques readily available.  Meds include amlodipine-valsartan, ASAS 81mg , estradiol, HCTZ, Prilosec.  01/29/12 Echo: Study Conclusions - Left ventricle: The cavity size was normal. Wall thickness was increased in a pattern of mild LVH. There was mild focal basal hypertrophy of the septum. Systolic function was normal. The estimated ejection fraction was in the range of 55% to 60%. Wall motion was normal; there were no regional wall motion abnormalities. Doppler parameters are consistent with  abnormal left ventricular relaxation (grade 1 diastolic dysfunction). - Mitral valve: Mild regurgitation.  07/19/08 LHC: CONCLUSION: Normal coronary angiography and left ventricular wall motion. LVEF 70%.  She needs labs and EKG at PAT. Anesthesia consult also needed due to history of difficult intubation.  [facility] Short Stay Center/Anesthesiology Phone [phone] 09/18/2014 5:44 PM  Addendum: I spoke with patient and her husband Shanon Brow during her PAT visit yesterday. They are a very pleasant couple. Patient is in good spirits, but a little more anxious about surgery due to her difficult intubation and post-operative NV history. Of note, she thought that during her 04/09/04 surgery, her heart required "shocking" but I don't see this mentioned in the anesthesia records, Dr. Lesle Chris operative report, or her discharge summary. Also, following her exploratory laparotomy, BSO, extensive adhesiolysis, and partial omentectomy on 04/09/04, she had significant post-operative N/V and required re-admission for worsening ileus versus partial SBO on 04/16/04. She was treated with a NGT and discharged on on 04/18/04. She was readmitted again on 04/30/04 for partial small bowel obstruction by GI Dr. Benson Norway and seen by general surgeon Dr. Marlou Starks. Continued medical management recommended, and she was discharged home on 05/07/04.  Patient reports that she initially developed afib in 2005 while taking Sudafed for a URI. She was also drinking more caffeine. She was aware of when she was in afib, and to her knowledge hasn't had any recurrence since ~ 2007. She now avoids pseudoephedrine and limits her caffeine to control her palpitations. She only has rare, non-persisting palpitations since 2014 and has not needed b-blocker therapy (pills now expired and thrown away). She denied chest pain, SOB. Last year she was running at the gym, but due to her worsening back and LLE pain she is now  only able to walk about  20 minutes at a time. She has a remote history of hammer toe surgery, and has had chronic right pedal edema since.  Exam shows a pleasant black female in NAD. Heart RRR, no murmur noted. Lungs clear. Mild right pedal edema. No carotid bruits noted.  Good mouth opening. She does have a somewhat short neck, but overall mobility does not appear to be significantly limited. Her front upper teeth are prominent, but these are partial that she will remove before surgery.   09/19/14 EKG: NSR, possible LAE, LVH, non-specific ST abnormality. Counter clockwise rotation. Inferior T wave abnormality appears stable when compared to 05/04/13 tracing.   09/19/14 CXR: IMPRESSION: No active cardiopulmonary disease.  Preoperative labs noted.   Patient has had no significant palpitations and no known afib in nearly 10 years. Cardiac cath in 2010 was okay. EKG appears stable. If no acute changes then I would anticipate that she can proceed as planned.  In regards to her difficult intubation history, we discussed anticipated use of glidescope (likely) or fiberoptic scope for intubation. She will discuss further with her assigned anesthesiologist on the day of surgery. She also wants him/her to be aware of her post-operative N/V history--although I told her that her post-operative ileus likely exacerbated her N/V with her last surgery. Anesthesiologist Dr. Tobias Alexander updated.  [facility] Short Stay Center/Anesthesiology Phone [phone] 09/20/2014 1:27 PM

## 2014-09-19 ENCOUNTER — Ambulatory Visit (HOSPITAL_COMMUNITY)
Admission: RE | Admit: 2014-09-19 | Discharge: 2014-09-19 | Disposition: A | Discharge status: Home / Self Care | Payer: 59 | Source: Ambulatory Visit | Attending: Neurological Surgery | Admitting: Neurological Surgery

## 2014-09-19 ENCOUNTER — Encounter (HOSPITAL_COMMUNITY)
Admission: RE | Admit: 2014-09-19 | Discharge: 2014-09-19 | Disposition: A | Discharge status: Home / Self Care | Payer: 59 | Source: Ambulatory Visit | Attending: Neurological Surgery | Admitting: Neurological Surgery

## 2014-09-19 ENCOUNTER — Encounter (HOSPITAL_COMMUNITY): Payer: Self-pay

## 2014-09-19 DIAGNOSIS — M4310 Spondylolisthesis, site unspecified: Secondary | ICD-10-CM

## 2014-09-19 DIAGNOSIS — K219 Gastro-esophageal reflux disease without esophagitis: Secondary | ICD-10-CM | POA: Diagnosis not present

## 2014-09-19 DIAGNOSIS — Z01812 Encounter for preprocedural laboratory examination: Secondary | ICD-10-CM | POA: Diagnosis not present

## 2014-09-19 DIAGNOSIS — I480 Paroxysmal atrial fibrillation: Secondary | ICD-10-CM | POA: Insufficient documentation

## 2014-09-19 DIAGNOSIS — Z01818 Encounter for other preprocedural examination: Secondary | ICD-10-CM | POA: Diagnosis present

## 2014-09-19 DIAGNOSIS — G4733 Obstructive sleep apnea (adult) (pediatric): Secondary | ICD-10-CM | POA: Diagnosis not present

## 2014-09-19 DIAGNOSIS — R609 Edema, unspecified: Secondary | ICD-10-CM | POA: Insufficient documentation

## 2014-09-19 DIAGNOSIS — J45909 Unspecified asthma, uncomplicated: Secondary | ICD-10-CM | POA: Insufficient documentation

## 2014-09-19 DIAGNOSIS — M4800 Spinal stenosis, site unspecified: Secondary | ICD-10-CM | POA: Insufficient documentation

## 2014-09-19 DIAGNOSIS — R9431 Abnormal electrocardiogram [ECG] [EKG]: Secondary | ICD-10-CM | POA: Diagnosis not present

## 2014-09-19 DIAGNOSIS — I10 Essential (primary) hypertension: Secondary | ICD-10-CM | POA: Diagnosis not present

## 2014-09-19 DIAGNOSIS — Z79899 Other long term (current) drug therapy: Secondary | ICD-10-CM | POA: Diagnosis not present

## 2014-09-19 DIAGNOSIS — Z0183 Encounter for blood typing: Secondary | ICD-10-CM | POA: Diagnosis not present

## 2014-09-19 DIAGNOSIS — Z7982 Long term (current) use of aspirin: Secondary | ICD-10-CM | POA: Diagnosis not present

## 2014-09-19 HISTORY — DX: Other specified postprocedural states: Z98.890

## 2014-09-19 HISTORY — DX: Nausea with vomiting, unspecified: R11.2

## 2014-09-19 HISTORY — DX: Failed or difficult intubation, initial encounter: T88.4XXA

## 2014-09-19 LAB — CBC WITH DIFFERENTIAL/PLATELET
Basophils Absolute: 0 10*3/uL (ref 0.0–0.1)
Basophils Relative: 0 % (ref 0–1)
Eosinophils Absolute: 0.5 10*3/uL (ref 0.0–0.7)
Eosinophils Relative: 7 % — ABNORMAL HIGH (ref 0–5)
HCT: 43.8 % (ref 36.0–46.0)
Hemoglobin: 15 g/dL (ref 12.0–15.0)
Lymphocytes Relative: 51 % — ABNORMAL HIGH (ref 12–46)
Lymphs Abs: 3.5 10*3/uL (ref 0.7–4.0)
MCH: 32.6 pg (ref 26.0–34.0)
MCHC: 34.2 g/dL (ref 30.0–36.0)
MCV: 95.2 fL (ref 78.0–100.0)
Monocytes Absolute: 0.5 10*3/uL (ref 0.1–1.0)
Monocytes Relative: 7 % (ref 3–12)
Neutro Abs: 2.4 10*3/uL (ref 1.7–7.7)
Neutrophils Relative %: 35 % — ABNORMAL LOW (ref 43–77)
Platelets: 368 10*3/uL (ref 150–400)
RBC: 4.6 MIL/uL (ref 3.87–5.11)
RDW: 12.8 % (ref 11.5–15.5)
WBC: 6.9 10*3/uL (ref 4.0–10.5)

## 2014-09-19 LAB — BASIC METABOLIC PANEL
Anion gap: 9 (ref 5–15)
BUN: 9 mg/dL (ref 6–20)
CO2: 25 mmol/L (ref 22–32)
Calcium: 9.3 mg/dL (ref 8.9–10.3)
Chloride: 102 mmol/L (ref 101–111)
Creatinine, Ser: 0.83 mg/dL (ref 0.44–1.00)
GFR calc Af Amer: >60 mL/min (ref >60)
GFR calc non Af Amer: >60 mL/min (ref >60)
Glucose, Bld: 92 mg/dL (ref 65–99)
Potassium: 3.5 mmol/L (ref 3.5–5.1)
Sodium: 136 mmol/L (ref 135–145)

## 2014-09-19 LAB — PROTIME-INR
INR: 1.06 (ref 0.00–1.49)
Prothrombin Time: 14 seconds (ref 11.6–15.2)

## 2014-09-19 LAB — HEPATIC FUNCTION PANEL
ALT: 23 U/L (ref 14–54)
AST: 23 U/L (ref 15–41)
Albumin: 4.1 g/dL (ref 3.5–5.0)
Alkaline Phosphatase: 57 U/L (ref 38–126)
Bilirubin, Direct: <0.1 mg/dL — ABNORMAL LOW (ref 0.1–0.5)
Total Bilirubin: 0.4 mg/dL (ref 0.3–1.2)
Total Protein: 7.5 g/dL (ref 6.5–8.1)

## 2014-09-19 LAB — TYPE AND SCREEN
ABO/RH(D): A POS
Antibody Screen: NEGATIVE

## 2014-09-19 LAB — SURGICAL PCR SCREEN
MRSA, PCR: NEGATIVE
Staphylococcus aureus: NEGATIVE

## 2014-09-19 NOTE — Anesthesia Preprocedure Evaluation (Addendum)
Anesthesia Evaluation  Patient identified by MRN, date of birth, ID band Patient awake    Reviewed: Allergy & Precautions, NPO status , Patient's Chart, lab work & pertinent test results  History of Anesthesia Complications (+) PONV, DIFFICULT AIRWAY and history of anesthetic complications  Airway Mallampati: II  TM Distance: >3 FB Neck ROM: Full    Dental  (+) Partial Upper, Partial Lower, Dental Advisory Given   Pulmonary asthma , sleep apnea ,    breath sounds clear to auscultation       Cardiovascular hypertension, Pt. on medications  Rhythm:Regular Rate:Normal     Neuro/Psych negative neurological ROS     GI/Hepatic Neg liver ROS, GERD  ,  Endo/Other  Morbid obesity  Renal/GU negative Renal ROS     Musculoskeletal   Abdominal   Peds  Hematology negative hematology ROS (+)   Anesthesia Other Findings   Reproductive/Obstetrics                          Anesthesia Physical Anesthesia Plan  ASA: II  Anesthesia Plan: General   Post-op Pain Management:    Induction: Intravenous  Airway Management Planned: Oral ETT  Additional Equipment:   Intra-op Plan:   Post-operative Plan: Extubation in OR  Informed Consent: I have reviewed the patients History and Physical, chart, labs and discussed the procedure including the risks, benefits and alternatives for the proposed anesthesia with the patient or authorized representative who has indicated his/her understanding and acceptance.   Dental advisory given  Plan Discussed with: CRNA  Anesthesia Plan Comments:         Anesthesia Quick Evaluation

## 2014-09-19 NOTE — Progress Notes (Signed)
Pt denies SOB and chest pain but stated that she is under the care of Dr. Cristopher Peru, cardiology. Pt denies having a cardiac cath but stated that she had a stress test more than five years ago.

## 2014-09-19 NOTE — Pre-Procedure Instructions (Signed)
[name]  09/19/2014      [facility] - [place] [place] [address] Phone: [phone] Fax: [phone]  [facility] [place] - [address] [place]) [place] [address] Phone: [phone] Fax: [phone]  [facility] [place], [place] [address] [place] [address] Phone: [phone] Fax: [phone]    Your procedure is scheduled on Wednesday, September 27, 2014  Report to [facility] Admitting at 6:30 A.M.  Call this number if you have problems the morning of surgery:  [phone]   Remember:  Do not eat food or drink liquids after midnight Tuesday, September 26, 2014  Take these medicines the morning of surgery with A SIP OF WATER :omeprazole (PRILOSEC)  Stop taking Aspirin, vitamins and herbal medications. Do not take any NSAIDs ie: Ibuprofen, Advil, Naproxen or any medication containing Aspirin such as diclofenac sodium (VOLTAREN); stop Wednesday, September 20, 2014  Do not wear jewelry, make-up or nail polish.  Do not wear lotions, powders, or perfumes.  You may not wear deodorant.  Do not shave 48 hours prior to surgery.   Do not bring valuables to the hospital.  [facility] is not responsible for any belongings or valuables.  Contacts, dentures or bridgework may not be worn into surgery.  Leave your suitcase in the car.  After surgery it may be brought to your room.  For patients admitted to the hospital, discharge time will be determined by your treatment team.  Patients discharged the day of surgery will not be allowed to drive home.   Name and phone number of your driver:   Special instructions: Special Instructions:Special Instructions: [facility] [place] - Preparing for Surgery  Before surgery, you can play an important role.  Because skin is not sterile, your skin  needs to be as free of germs as possible.  You can reduce the number of germs on you skin by washing with CHG (chlorahexidine gluconate) soap before surgery.  CHG is an antiseptic cleaner which kills germs and bonds with the skin to continue killing germs even after washing.  Please DO NOT use if you have an allergy to CHG or antibacterial soaps.  If your skin becomes reddened/irritated stop using the CHG and inform your nurse when you arrive at Short Stay.  Do not shave (including legs and underarms) for at least 48 hours prior to the first CHG shower.  You may shave your face.  Please follow these instructions carefully:   1.  Shower with CHG Soap the night before surgery and the morning of Surgery.  2.  If you choose to wash your hair, wash your hair first as usual with your normal shampoo.  3.  After you shampoo, rinse your hair and body thoroughly to remove the Shampoo.  4.  Use CHG as you would any other liquid soap.  You can apply chg directly  to the skin and wash gently with scrungie or a clean washcloth.  5.  Apply the CHG Soap to your body ONLY FROM THE NECK DOWN.  Do not use on open wounds or open sores.  Avoid contact with your eyes, ears, mouth and genitals (private parts).  Wash genitals (private parts) with your normal soap.  6.  Wash thoroughly, paying special attention to the area where your surgery will be performed.  7.  Thoroughly rinse your body with warm water from the neck down.  8.  DO NOT shower/wash with your normal soap after using and rinsing off the CHG Soap.  9.  Pat yourself dry with a clean towel.            10.  Wear clean pajamas.            11.  Place clean sheets on your bed the night of your first shower and do not sleep with pets.  Day of Surgery  Do not apply any lotions/deodorants the morning of surgery.  Please wear clean clothes to the hospital/surgery center.  Please read over the following fact sheets that you were given. Pain Booklet, Coughing  and Deep Breathing, Blood Transfusion Information, MRSA Information and Surgical Site Infection Prevention

## 2014-09-20 LAB — ABO/RH: ABO/RH(D): A POS

## 2014-09-26 MED ORDER — CEFAZOLIN SODIUM-DEXTROSE 2-3 GM-% IV SOLR
2.0000 g | INTRAVENOUS | Status: AC
  Administered 2014-09-27: 2 g via INTRAVENOUS

## 2014-09-26 MED ORDER — DEXAMETHASONE SODIUM PHOSPHATE 10 MG/ML IJ SOLN
10.0000 mg | INTRAMUSCULAR | Status: AC
  Administered 2014-09-27: 10 mg via INTRAVENOUS
  Filled 2014-09-26: qty 1

## 2014-09-27 ENCOUNTER — Inpatient Hospital Stay (HOSPITAL_COMMUNITY): Payer: 59 | Admitting: Vascular Surgery

## 2014-09-27 ENCOUNTER — Inpatient Hospital Stay (HOSPITAL_COMMUNITY)
Admission: RE | Admit: 2014-09-27 | Discharge: 2014-09-29 | DRG: 460 | Disposition: A | Discharge status: Home / Self Care | Payer: 59 | Source: Ambulatory Visit | Attending: Neurological Surgery | Admitting: Neurological Surgery

## 2014-09-27 ENCOUNTER — Encounter (HOSPITAL_COMMUNITY)
Admission: RE | Disposition: A | Discharge status: Home / Self Care | Payer: 59 | Source: Ambulatory Visit | Attending: Neurological Surgery

## 2014-09-27 ENCOUNTER — Inpatient Hospital Stay (HOSPITAL_COMMUNITY): Payer: 59

## 2014-09-27 ENCOUNTER — Encounter (HOSPITAL_COMMUNITY): Payer: Self-pay | Admitting: Neurological Surgery

## 2014-09-27 DIAGNOSIS — Z981 Arthrodesis status: Secondary | ICD-10-CM

## 2014-09-27 DIAGNOSIS — G4733 Obstructive sleep apnea (adult) (pediatric): Secondary | ICD-10-CM | POA: Diagnosis present

## 2014-09-27 DIAGNOSIS — M4806 Spinal stenosis, lumbar region: Secondary | ICD-10-CM | POA: Diagnosis present

## 2014-09-27 DIAGNOSIS — Z79890 Hormone replacement therapy (postmenopausal): Secondary | ICD-10-CM

## 2014-09-27 DIAGNOSIS — Z7982 Long term (current) use of aspirin: Secondary | ICD-10-CM

## 2014-09-27 DIAGNOSIS — K219 Gastro-esophageal reflux disease without esophagitis: Secondary | ICD-10-CM | POA: Diagnosis present

## 2014-09-27 DIAGNOSIS — M545 Low back pain: Secondary | ICD-10-CM | POA: Diagnosis present

## 2014-09-27 DIAGNOSIS — Z79899 Other long term (current) drug therapy: Secondary | ICD-10-CM

## 2014-09-27 DIAGNOSIS — I10 Essential (primary) hypertension: Secondary | ICD-10-CM | POA: Diagnosis present

## 2014-09-27 DIAGNOSIS — M4316 Spondylolisthesis, lumbar region: Secondary | ICD-10-CM | POA: Diagnosis present

## 2014-09-27 DIAGNOSIS — Z419 Encounter for procedure for purposes other than remedying health state, unspecified: Secondary | ICD-10-CM

## 2014-09-27 HISTORY — PX: MAXIMUM ACCESS (MAS)POSTERIOR LUMBAR INTERBODY FUSION (PLIF) 2 LEVEL: SHX6369

## 2014-09-27 SURGERY — FOR MAXIMUM ACCESS (MAS) POSTERIOR LUMBAR INTERBODY FUSION (PLIF) 2 LEVEL
Anesthesia: General | Site: Back

## 2014-09-27 MED ORDER — ONE-DAILY MULTI VITAMINS PO TABS: 1.0000 | ORAL_TABLET | Freq: Every day | ORAL | Status: DC

## 2014-09-27 MED ORDER — MENTHOL 3 MG MT LOZG: 1.0000 | LOZENGE | OROMUCOSAL | Status: DC | PRN

## 2014-09-27 MED ORDER — OXYCODONE HCL 5 MG PO TABS: 5.0000 mg | ORAL_TABLET | Freq: Once | ORAL | Status: DC | PRN

## 2014-09-27 MED ORDER — ONDANSETRON HCL 4 MG/2ML IJ SOLN
INTRAMUSCULAR | Status: AC
  Filled 2014-09-27: qty 2

## 2014-09-27 MED ORDER — SODIUM CHLORIDE 0.9 % IJ SOLN
3.0000 mL | Freq: Two times a day (BID) | INTRAMUSCULAR | Status: DC
  Administered 2014-09-27 – 2014-09-29 (×3): 3 mL via INTRAVENOUS

## 2014-09-27 MED ORDER — PROPOFOL 10 MG/ML IV BOLUS
INTRAVENOUS | Status: AC
  Filled 2014-09-27: qty 20

## 2014-09-27 MED ORDER — VANCOMYCIN HCL 1000 MG IV SOLR
INTRAVENOUS | Status: AC
  Filled 2014-09-27: qty 1000

## 2014-09-27 MED ORDER — SCOPOLAMINE 1 MG/3DAYS TD PT72
MEDICATED_PATCH | TRANSDERMAL | Status: AC
  Filled 2014-09-27: qty 1

## 2014-09-27 MED ORDER — OXYCODONE HCL 5 MG/5ML PO SOLN: 5.0000 mg | Freq: Once | ORAL | Status: DC | PRN

## 2014-09-27 MED ORDER — FENTANYL CITRATE (PF) 250 MCG/5ML IJ SOLN
INTRAMUSCULAR | Status: AC
  Filled 2014-09-27: qty 5

## 2014-09-27 MED ORDER — PROPOFOL 10 MG/ML IV BOLUS
INTRAVENOUS | Status: DC | PRN
  Administered 2014-09-27: 150 mg via INTRAVENOUS

## 2014-09-27 MED ORDER — ONDANSETRON HCL 4 MG/2ML IJ SOLN
4.0000 mg | INTRAMUSCULAR | Status: DC | PRN
  Administered 2014-09-27: 4 mg via INTRAVENOUS
  Filled 2014-09-27: qty 2

## 2014-09-27 MED ORDER — PHENOL 1.4 % MT LIQD: 1.0000 | OROMUCOSAL | Status: DC | PRN

## 2014-09-27 MED ORDER — OXYCODONE-ACETAMINOPHEN 5-325 MG PO TABS
1.0000 | ORAL_TABLET | ORAL | Status: DC | PRN
  Administered 2014-09-27: 1 via ORAL
  Administered 2014-09-28 – 2014-09-29 (×4): 2 via ORAL
  Filled 2014-09-27 (×3): qty 2
  Filled 2014-09-27: qty 1
  Filled 2014-09-27 (×2): qty 2

## 2014-09-27 MED ORDER — ASPIRIN EC 81 MG PO TBEC
81.0000 mg | DELAYED_RELEASE_TABLET | Freq: Every day | ORAL | Status: DC
  Administered 2014-09-27 – 2014-09-29 (×3): 81 mg via ORAL
  Filled 2014-09-27 (×3): qty 1

## 2014-09-27 MED ORDER — CALCIUM CARBONATE-VITAMIN D 500-200 MG-UNIT PO TABS
1.0000 | ORAL_TABLET | Freq: Every day | ORAL | Status: DC
  Administered 2014-09-27 – 2014-09-29 (×3): 1 via ORAL
  Filled 2014-09-27 (×3): qty 1

## 2014-09-27 MED ORDER — MIDAZOLAM HCL 5 MG/5ML IJ SOLN
INTRAMUSCULAR | Status: DC | PRN
  Administered 2014-09-27: 2 mg via INTRAVENOUS

## 2014-09-27 MED ORDER — ACETAMINOPHEN 650 MG RE SUPP: 650.0000 mg | RECTAL | Status: DC | PRN

## 2014-09-27 MED ORDER — LIDOCAINE HCL (CARDIAC) 20 MG/ML IV SOLN
INTRAVENOUS | Status: DC | PRN
  Administered 2014-09-27: 60 mg via INTRAVENOUS

## 2014-09-27 MED ORDER — VANCOMYCIN HCL 1000 MG IV SOLR
INTRAVENOUS | Status: DC | PRN
  Administered 2014-09-27: 1000 mg via TOPICAL

## 2014-09-27 MED ORDER — CELECOXIB 200 MG PO CAPS
200.0000 mg | ORAL_CAPSULE | Freq: Two times a day (BID) | ORAL | Status: DC
  Administered 2014-09-27 – 2014-09-29 (×4): 200 mg via ORAL
  Filled 2014-09-27 (×4): qty 1

## 2014-09-27 MED ORDER — PROMETHAZINE HCL 25 MG/ML IJ SOLN
12.5000 mg | Freq: Four times a day (QID) | INTRAMUSCULAR | Status: DC | PRN
  Administered 2014-09-27: 12.5 mg via INTRAVENOUS
  Filled 2014-09-27: qty 1

## 2014-09-27 MED ORDER — PROMETHAZINE HCL 25 MG/ML IJ SOLN: 6.2500 mg | INTRAMUSCULAR | Status: DC | PRN

## 2014-09-27 MED ORDER — KETOROLAC TROMETHAMINE 30 MG/ML IJ SOLN
INTRAMUSCULAR | Status: DC | PRN
  Administered 2014-09-27: 30 mg via INTRAVENOUS

## 2014-09-27 MED ORDER — CALCIUM CARBONATE-VITAMIN D 600-400 MG-UNIT PO TABS: 1.0000 | ORAL_TABLET | Freq: Every day | ORAL | Status: DC

## 2014-09-27 MED ORDER — DEXAMETHASONE SODIUM PHOSPHATE 4 MG/ML IJ SOLN
4.0000 mg | Freq: Four times a day (QID) | INTRAMUSCULAR | Status: DC
  Administered 2014-09-28: 4 mg via INTRAVENOUS
  Filled 2014-09-27: qty 1

## 2014-09-27 MED ORDER — LIDOCAINE HCL (CARDIAC) 20 MG/ML IV SOLN
INTRAVENOUS | Status: AC
  Filled 2014-09-27: qty 5

## 2014-09-27 MED ORDER — ACETAMINOPHEN 325 MG PO TABS: 650.0000 mg | ORAL_TABLET | ORAL | Status: DC | PRN

## 2014-09-27 MED ORDER — PROPOFOL INFUSION 10 MG/ML OPTIME
INTRAVENOUS | Status: DC | PRN
  Administered 2014-09-27: 50 ug/kg/min via INTRAVENOUS

## 2014-09-27 MED ORDER — SODIUM CHLORIDE 0.9 % IR SOLN
Status: DC | PRN
  Administered 2014-09-27: 10:00:00

## 2014-09-27 MED ORDER — HYDROMORPHONE HCL 1 MG/ML IJ SOLN
INTRAMUSCULAR | Status: AC
  Filled 2014-09-27: qty 1

## 2014-09-27 MED ORDER — METHOCARBAMOL 500 MG PO TABS
500.0000 mg | ORAL_TABLET | Freq: Four times a day (QID) | ORAL | Status: DC | PRN
  Administered 2014-09-27: 500 mg via ORAL
  Filled 2014-09-27 (×2): qty 1

## 2014-09-27 MED ORDER — EPHEDRINE SULFATE 50 MG/ML IJ SOLN
INTRAMUSCULAR | Status: DC | PRN
  Administered 2014-09-27 (×2): 5 mg via INTRAVENOUS
  Administered 2014-09-27 (×2): 10 mg via INTRAVENOUS
  Administered 2014-09-27: 5 mg via INTRAVENOUS

## 2014-09-27 MED ORDER — MORPHINE SULFATE (PF) 2 MG/ML IV SOLN
1.0000 mg | INTRAVENOUS | Status: DC | PRN
  Administered 2014-09-27 – 2014-09-28 (×3): 2 mg via INTRAVENOUS
  Filled 2014-09-27 (×3): qty 1

## 2014-09-27 MED ORDER — SODIUM CHLORIDE 0.9 % IJ SOLN
3.0000 mL | INTRAMUSCULAR | Status: DC | PRN
  Administered 2014-09-28: 3 mL via INTRAVENOUS
  Filled 2014-09-27: qty 3

## 2014-09-27 MED ORDER — MIDAZOLAM HCL 2 MG/2ML IJ SOLN
INTRAMUSCULAR | Status: AC
  Filled 2014-09-27: qty 4

## 2014-09-27 MED ORDER — SCOPOLAMINE 1 MG/3DAYS TD PT72
MEDICATED_PATCH | TRANSDERMAL | Status: DC | PRN
  Administered 2014-09-27: 1 via TRANSDERMAL

## 2014-09-27 MED ORDER — CEFAZOLIN SODIUM 1-5 GM-% IV SOLN
1.0000 g | Freq: Three times a day (TID) | INTRAVENOUS | Status: AC
  Administered 2014-09-27 – 2014-09-28 (×2): 1 g via INTRAVENOUS
  Filled 2014-09-27 (×2): qty 50

## 2014-09-27 MED ORDER — THROMBIN 5000 UNITS EX SOLR
OROMUCOSAL | Status: DC | PRN
  Administered 2014-09-27: 10:00:00 via TOPICAL

## 2014-09-27 MED ORDER — METHOCARBAMOL 1000 MG/10ML IJ SOLN
500.0000 mg | Freq: Four times a day (QID) | INTRAMUSCULAR | Status: DC | PRN
  Filled 2014-09-27: qty 5

## 2014-09-27 MED ORDER — LACTATED RINGERS IV SOLN
INTRAVENOUS | Status: DC | PRN
  Administered 2014-09-27 (×2): via INTRAVENOUS

## 2014-09-27 MED ORDER — HYDROCHLOROTHIAZIDE 12.5 MG PO CAPS
12.5000 mg | ORAL_CAPSULE | Freq: Every day | ORAL | Status: DC
  Administered 2014-09-27 – 2014-09-29 (×3): 12.5 mg via ORAL
  Filled 2014-09-27 (×3): qty 1

## 2014-09-27 MED ORDER — POTASSIUM CHLORIDE IN NACL 20-0.9 MEQ/L-% IV SOLN
INTRAVENOUS | Status: DC
  Administered 2014-09-27: 21:00:00 via INTRAVENOUS
  Filled 2014-09-27 (×5): qty 1000

## 2014-09-27 MED ORDER — ONDANSETRON HCL 4 MG/2ML IJ SOLN
INTRAMUSCULAR | Status: DC | PRN
  Administered 2014-09-27 (×2): 4 mg via INTRAVENOUS

## 2014-09-27 MED ORDER — ASPIRIN 81 MG PO TABS: 81.0000 mg | ORAL_TABLET | Freq: Every day | ORAL | Status: DC

## 2014-09-27 MED ORDER — 0.9 % SODIUM CHLORIDE (POUR BTL) OPTIME
TOPICAL | Status: DC | PRN
  Administered 2014-09-27: 1000 mL

## 2014-09-27 MED ORDER — DIPHENHYDRAMINE HCL 50 MG/ML IJ SOLN
INTRAMUSCULAR | Status: AC
  Filled 2014-09-27: qty 1

## 2014-09-27 MED ORDER — FENTANYL CITRATE (PF) 100 MCG/2ML IJ SOLN
INTRAMUSCULAR | Status: DC | PRN
  Administered 2014-09-27: 100 ug via INTRAVENOUS
  Administered 2014-09-27: 50 ug via INTRAVENOUS
  Administered 2014-09-27: 100 ug via INTRAVENOUS
  Administered 2014-09-27: 50 ug via INTRAVENOUS
  Administered 2014-09-27: 150 ug via INTRAVENOUS

## 2014-09-27 MED ORDER — ADULT MULTIVITAMIN W/MINERALS CH
1.0000 | ORAL_TABLET | Freq: Every day | ORAL | Status: DC
  Administered 2014-09-27 – 2014-09-29 (×3): 1 via ORAL
  Filled 2014-09-27 (×3): qty 1

## 2014-09-27 MED ORDER — AMLODIPINE BESYLATE 5 MG PO TABS
5.0000 mg | ORAL_TABLET | Freq: Every day | ORAL | Status: DC
  Administered 2014-09-27 – 2014-09-29 (×3): 5 mg via ORAL
  Filled 2014-09-27 (×3): qty 1

## 2014-09-27 MED ORDER — HYDROMORPHONE HCL 1 MG/ML IJ SOLN
0.2500 mg | INTRAMUSCULAR | Status: DC | PRN
  Administered 2014-09-27 (×2): 0.5 mg via INTRAVENOUS

## 2014-09-27 MED ORDER — SUCCINYLCHOLINE CHLORIDE 20 MG/ML IJ SOLN
INTRAMUSCULAR | Status: AC
  Filled 2014-09-27: qty 1

## 2014-09-27 MED ORDER — EPHEDRINE SULFATE 50 MG/ML IJ SOLN
INTRAMUSCULAR | Status: AC
  Filled 2014-09-27: qty 1

## 2014-09-27 MED ORDER — SODIUM CHLORIDE 0.9 % IV SOLN: 250.0000 mL | INTRAVENOUS | Status: DC

## 2014-09-27 MED ORDER — SENNA 8.6 MG PO TABS
1.0000 | ORAL_TABLET | Freq: Two times a day (BID) | ORAL | Status: DC
  Administered 2014-09-27 – 2014-09-29 (×4): 8.6 mg via ORAL
  Filled 2014-09-27 (×4): qty 1

## 2014-09-27 MED ORDER — SODIUM CHLORIDE 0.9 % IJ SOLN
INTRAMUSCULAR | Status: AC
  Filled 2014-09-27: qty 10

## 2014-09-27 MED ORDER — BUPIVACAINE HCL (PF) 0.25 % IJ SOLN
INTRAMUSCULAR | Status: DC | PRN
  Administered 2014-09-27: 4 mL

## 2014-09-27 MED ORDER — SUCCINYLCHOLINE CHLORIDE 20 MG/ML IJ SOLN
INTRAMUSCULAR | Status: DC | PRN
  Administered 2014-09-27: 100 mg via INTRAVENOUS

## 2014-09-27 MED ORDER — AMLODIPINE BESYLATE-VALSARTAN 5-160 MG PO TABS: 1.0000 | ORAL_TABLET | Freq: Every day | ORAL | Status: DC

## 2014-09-27 MED ORDER — KETOROLAC TROMETHAMINE 30 MG/ML IJ SOLN: 30.0000 mg | Freq: Once | INTRAMUSCULAR | Status: DC

## 2014-09-27 MED ORDER — THROMBIN 20000 UNITS EX SOLR
CUTANEOUS | Status: DC | PRN
  Administered 2014-09-27: 10:00:00 via TOPICAL

## 2014-09-27 MED ORDER — DEXAMETHASONE 4 MG PO TABS
4.0000 mg | ORAL_TABLET | Freq: Four times a day (QID) | ORAL | Status: DC
  Administered 2014-09-27 – 2014-09-29 (×6): 4 mg via ORAL
  Filled 2014-09-27 (×6): qty 1

## 2014-09-27 MED ORDER — IRBESARTAN 150 MG PO TABS
150.0000 mg | ORAL_TABLET | Freq: Every day | ORAL | Status: DC
  Administered 2014-09-27 – 2014-09-29 (×3): 150 mg via ORAL
  Filled 2014-09-27 (×3): qty 1

## 2014-09-27 SURGICAL SUPPLY — 70 items
APL SKNCLS STERI-STRIP NONHPOA (GAUZE/BANDAGES/DRESSINGS) ×1
BAG DECANTER FOR FLEXI CONT (MISCELLANEOUS) ×2 IMPLANT
BENZOIN TINCTURE PRP APPL 2/3 (GAUZE/BANDAGES/DRESSINGS) ×2 IMPLANT
BIT DRILL PLIF MAS 5.0MM DISP (DRILL) IMPLANT
BLADE CLIPPER SURG (BLADE) IMPLANT
BONE MATRIX OSTEOCEL PRO MED (Bone Implant) ×1 IMPLANT
BUR MATCHSTICK NEURO 3.0 LAGG (BURR) ×2 IMPLANT
CAGE COROENT PLIF 10X28-8 LUMB (Cage) ×2 IMPLANT
CAGE MAS PLIF 9X9X23-8 LUMBAR (Cage) ×2 IMPLANT
CANISTER SUCT 3000ML PPV (MISCELLANEOUS) ×2 IMPLANT
CLIP NEUROVISION LG (CLIP) ×1 IMPLANT
CONT SPEC 4OZ CLIKSEAL STRL BL (MISCELLANEOUS) ×2 IMPLANT
COVER BACK TABLE 24X17X13 BIG (DRAPES) IMPLANT
COVER BACK TABLE 60X90IN (DRAPES) ×2 IMPLANT
DRAPE C-ARM 42X72 X-RAY (DRAPES) ×2 IMPLANT
DRAPE C-ARMOR (DRAPES) ×2 IMPLANT
DRAPE LAPAROTOMY 100X72X124 (DRAPES) ×2 IMPLANT
DRAPE POUCH INSTRU U-SHP 10X18 (DRAPES) ×2 IMPLANT
DRAPE SURG 17X23 STRL (DRAPES) ×2 IMPLANT
DRILL PLIF MAS 5.0MM DISP (DRILL) ×2
DRSG OPSITE POSTOP 4X6 (GAUZE/BANDAGES/DRESSINGS) ×1 IMPLANT
DURAPREP 26ML APPLICATOR (WOUND CARE) ×2 IMPLANT
ELECT BLADE 4.0 EZ CLEAN MEGAD (MISCELLANEOUS) ×2
ELECT REM PT RETURN 9FT ADLT (ELECTROSURGICAL) ×2
ELECTRODE BLDE 4.0 EZ CLN MEGD (MISCELLANEOUS) IMPLANT
ELECTRODE REM PT RTRN 9FT ADLT (ELECTROSURGICAL) ×1 IMPLANT
EVACUATOR 1/8 PVC DRAIN (DRAIN) ×1 IMPLANT
GAUZE SPONGE 4X4 16PLY XRAY LF (GAUZE/BANDAGES/DRESSINGS) IMPLANT
GLOVE BIO SURGEON STRL SZ8 (GLOVE) ×4 IMPLANT
GLOVE BIO SURGEON STRL SZ8.5 (GLOVE) ×1 IMPLANT
GLOVE BIOGEL PI IND STRL 7.0 (GLOVE) IMPLANT
GLOVE BIOGEL PI INDICATOR 7.0 (GLOVE) ×4
GOWN STRL REUS W/ TWL LRG LVL3 (GOWN DISPOSABLE) IMPLANT
GOWN STRL REUS W/ TWL XL LVL3 (GOWN DISPOSABLE) ×2 IMPLANT
GOWN STRL REUS W/TWL 2XL LVL3 (GOWN DISPOSABLE) IMPLANT
GOWN STRL REUS W/TWL LRG LVL3 (GOWN DISPOSABLE) ×2
GOWN STRL REUS W/TWL XL LVL3 (GOWN DISPOSABLE) ×4
HEMOSTAT POWDER KIT SURGIFOAM (HEMOSTASIS) ×1 IMPLANT
KIT BASIN OR (CUSTOM PROCEDURE TRAY) ×2 IMPLANT
KIT NDL NVM5 EMG ELECT (KITS) IMPLANT
KIT NEEDLE NVM5 EMG ELECT (KITS) ×1 IMPLANT
KIT NEEDLE NVM5 EMG ELECTRODE (KITS) ×1
KIT ROOM TURNOVER OR (KITS) ×2 IMPLANT
MILL MEDIUM DISP (BLADE) ×1 IMPLANT
NDL HYPO 25X1 1.5 SAFETY (NEEDLE) ×1 IMPLANT
NEEDLE HYPO 25X1 1.5 SAFETY (NEEDLE) ×2 IMPLANT
NS IRRIG 1000ML POUR BTL (IV SOLUTION) ×2 IMPLANT
PACK LAMINECTOMY NEURO (CUSTOM PROCEDURE TRAY) ×2 IMPLANT
PAD ARMBOARD 7.5X6 YLW CONV (MISCELLANEOUS) ×6 IMPLANT
ROD PLIF MAS 65MM (Rod) ×2 IMPLANT
SCREW LOCK (Screw) ×12 IMPLANT
SCREW LOCK FXNS SPNE MAS PL (Screw) IMPLANT
SCREW MAS PLIF 5.5X30 (Screw) ×3 IMPLANT
SCREW PAS PLIF 5X30 (Screw) IMPLANT
SCREW PLIF MAS 5.0X25MM (Screw) ×1 IMPLANT
SCREW SHANK 5.0X30MM (Screw) ×2 IMPLANT
SCREW TULIP 5.5 (Screw) ×2 IMPLANT
SPONGE LAP 4X18 X RAY DECT (DISPOSABLE) IMPLANT
SPONGE SURGIFOAM ABS GEL 100 (HEMOSTASIS) ×2 IMPLANT
STRIP CLOSURE SKIN 1/2X4 (GAUZE/BANDAGES/DRESSINGS) ×3 IMPLANT
SUT VIC AB 0 CT1 18XCR BRD8 (SUTURE) ×1 IMPLANT
SUT VIC AB 0 CT1 8-18 (SUTURE) ×4
SUT VIC AB 2-0 CP2 18 (SUTURE) ×3 IMPLANT
SUT VIC AB 3-0 SH 8-18 (SUTURE) ×4 IMPLANT
SYR 3ML LL SCALE MARK (SYRINGE) IMPLANT
TOWEL OR 17X24 6PK STRL BLUE (TOWEL DISPOSABLE) ×2 IMPLANT
TOWEL OR 17X26 10 PK STRL BLUE (TOWEL DISPOSABLE) ×2 IMPLANT
TRAP SPECIMEN MUCOUS 40CC (MISCELLANEOUS) ×1 IMPLANT
TRAY FOLEY W/METER SILVER 14FR (SET/KITS/TRAYS/PACK) ×2 IMPLANT
WATER STERILE IRR 1000ML POUR (IV SOLUTION) ×2 IMPLANT

## 2014-09-27 NOTE — Anesthesia Procedure Notes (Signed)
Procedure Name: Intubation Date/Time: 09/27/2014 8:36 AM Performed by: Barrington Ellison Pre-anesthesia Checklist: Patient identified, Emergency Drugs available, Suction available, Patient being monitored and Timeout performed Patient Re-evaluated:Patient Re-evaluated prior to inductionOxygen Delivery Method: Circle system utilized Preoxygenation: Pre-oxygenation with 100% oxygen Intubation Type: IV induction, Rapid sequence and Cricoid Pressure applied Ventilation: Mask ventilation without difficulty and Two handed mask ventilation required Laryngoscope Size: Glidescope Grade View: Grade I Tube type: Oral Tube size: 7.0 mm Number of attempts: 1 Airway Equipment and Method: Stylet and Video-laryngoscopy Placement Confirmation: ETT inserted through vocal cords under direct vision,  positive ETCO2 and breath sounds checked- equal and bilateral Secured at: 21 cm Tube secured with: Tape Dental Injury: Teeth and Oropharynx as per pre-operative assessment  Difficulty Due To: Difficulty was anticipated Future Recommendations: Recommend- induction with short-acting agent, and alternative techniques readily available Comments: Glidescope electively used due to previous difficult intubation noted. Intubation uneventful with Glidescope.

## 2014-09-27 NOTE — Anesthesia Postprocedure Evaluation (Signed)
  Anesthesia Post-op Note  Patient: [name]  Procedure(s) Performed: Procedure(s): Maximum Access Surgery Posterior Lumbar Interbody Fusion Lumbar three-four, Lumbar four-five (N/A)  Patient Location: PACU  Anesthesia Type:General  Level of Consciousness: awake and alert   Airway and Oxygen Therapy: Patient Spontanous Breathing  Post-op Pain: none  Post-op Assessment: Post-op Vital signs reviewed              Post-op Vital Signs: Reviewed  Last Vitals:  Filed Vitals:   09/27/14 1407  BP:   Pulse:   Temp: 36.1 C  Resp:     Complications: No apparent anesthesia complications

## 2014-09-27 NOTE — Transfer of Care (Signed)
Immediate Anesthesia Transfer of Care Note  Patient: [name]  Procedure(s) Performed: Procedure(s): Maximum Access Surgery Posterior Lumbar Interbody Fusion Lumbar three-four, Lumbar four-five (N/A)  Patient Location: PACU  Anesthesia Type:General  Level of Consciousness: lethargic and responds to stimulation  Airway & Oxygen Therapy: Patient Spontanous Breathing and Patient connected to face mask oxygen  Post-op Assessment: Report given to RN  Post vital signs: Reviewed and stable  Last Vitals:  Filed Vitals:   09/27/14 0700  BP: 145/78  Pulse: 64  Temp: 36.6 C  Resp: 20    Complications: No apparent anesthesia complications

## 2014-09-27 NOTE — H&P (Signed)
Subjective: Patient is a 59 y.o. female admitted for PLIF. Onset of symptoms was long ago, progressively worse since that time.  The pain is rated severe, and is located at the low back and radiates to legs. The pain is described as aching and occurs all day. The symptoms have been progressive. Symptoms are exacerbated by activity. MRI or CT showed spondylolisthesis with stenosis.  Past Medical History  Diagnosis Date  . Anemia   . Atrial fibrillation     PAF  . Headache(784.0)   . Hypertension   . GERD (gastroesophageal reflux disease)   . Gastritis   . Fatty liver   . Diverticulosis   . Internal hemorrhoids   . Depression   . OSA (obstructive sleep apnea)   . Asthma   . Adenomatous colon polyp 05/2002  . Hemorrhoids   . Spinal stenosis   . Allergy   . Difficult intubation     04/09/04-difficult intubation (unabale to pass ETT with Sabra Heck 2; unable to visualize cord with MAC 3; intubating LMA used  . PONV (postoperative nausea and vomiting)   . Family history of adverse reaction to anesthesia     sister PONV  . Wears glasses   . Diverticulitis     Past Surgical History  Procedure Laterality Date  . Partial hysterectomy    . Appendectomy    . Breast reduction surgery    . Total abdominal hysterectomy w/ bilateral salpingoophorectomy    . Cervical fusion    . Bunionectomy      Prior to Admission medications   Medication Sig Start Date End Date Taking? Authorizing Provider  amLODipine-valsartan (EXFORGE) 5-160 MG per tablet Take 1 tablet by mouth daily. 08/18/14  Yes [name], MD  aspirin 81 MG tablet Take 81 mg by mouth daily.   Yes Historical Provider, MD  Calcium Carbonate-Vitamin D (CALCIUM 600+D) 600-400 MG-UNIT per tablet Take 1 tablet by mouth daily.     Yes Historical Provider, MD  diclofenac sodium (VOLTAREN) 1 % GEL Apply 2 g topically 4 (four) times daily as needed (pain).   Yes Historical Provider, MD  estradiol (VIVELLE-DOT) 0.05 MG/24HR Place 1 patch  onto the skin 2 (two) times a week. Apply Tuesdays and Fridays   Yes Historical Provider, MD  hydrochlorothiazide (MICROZIDE) 12.5 MG capsule TAKE 1 CAPSULE DAILY 07/05/14  Yes [name], MD  Multiple Vitamin (MULTIVITAMIN) tablet Take 1 tablet by mouth daily.   Yes Historical Provider, MD  omeprazole (PRILOSEC) 40 MG capsule Take 40 mg by mouth daily.   Yes Historical Provider, MD  cyclobenzaprine (FLEXERIL) 10 MG tablet Take 1 tablet (10 mg total) by mouth 2 (two) times daily as needed for muscle spasms. Patient not taking: Reported on 09/14/2014 12/25/13   Pattricia Boss, MD  traMADol (ULTRAM) 50 MG tablet Take 1-2 tablets every 6 hours as need for pain. Patient not taking: Reported on 09/14/2014 12/22/13   [name], MD   Allergies  Allergen Reactions  . Tramadol     GI upset, "I felt like I was in a fog"    Social History  Substance Use Topics  . Smoking status: Never Smoker   . Smokeless tobacco: Never Used  . Alcohol Use: No    Family History  Problem Relation Age of Onset  . Diabetes Mother   . Hypertension Mother   . Coronary artery disease Sister   . Hypertension Sister   . Colon cancer Neg Hx  Review of Systems  Positive ROS: neg  All other systems have been reviewed and were otherwise negative with the exception of those mentioned in the HPI and as above.  Objective: Vital signs in last 24 hours:    General Appearance: Alert, cooperative, no distress, appears stated age Head: Normocephalic, without obvious abnormality, atraumatic Eyes: PERRL, conjunctiva/corneas clear, EOM's intact    Neck: Supple, symmetrical, trachea midline Back: Symmetric, no curvature, ROM normal, no CVA tenderness Lungs:  respirations unlabored Heart: Regular rate and rhythm Abdomen: Soft, non-tender Extremities: Extremities normal, atraumatic, no cyanosis or edema Pulses: 2+ and symmetric all extremities Skin: Skin color, texture, turgor normal, no rashes or  lesions  NEUROLOGIC:   Mental status: Alert and oriented x4,  no aphasia, good attention span, fund of knowledge, and memory Motor Exam - grossly normal Sensory Exam - grossly normal Reflexes: 1+ Coordination - grossly normal Gait - grossly normal Balance - grossly normal Cranial Nerves: I: smell Not tested  II: visual acuity  OS: nl    OD: nl  II: visual fields Full to confrontation  II: pupils Equal, round, reactive to light  III,VII: ptosis None  III,IV,VI: extraocular muscles  Full ROM  V: mastication Normal  V: facial light touch sensation  Normal  V,VII: corneal reflex  Present  VII: facial muscle function - upper  Normal  VII: facial muscle function - lower Normal  VIII: hearing Not tested  IX: soft palate elevation  Normal  IX,X: gag reflex Present  XI: trapezius strength  5/5  XI: sternocleidomastoid strength 5/5  XI: neck flexion strength  5/5  XII: tongue strength  Normal    Data Review Lab Results  Component Value Date   WBC 6.9 09/19/2014   HGB 15.0 09/19/2014   HCT 43.8 09/19/2014   MCV 95.2 09/19/2014   PLT 368 09/19/2014   Lab Results  Component Value Date   NA 136 09/19/2014   K 3.5 09/19/2014   CL 102 09/19/2014   CO2 25 09/19/2014   BUN 9 09/19/2014   CREATININE 0.83 09/19/2014   GLUCOSE 92 09/19/2014   Lab Results  Component Value Date   INR 1.06 09/19/2014    Assessment/Plan: Patient admitted for PLIF L3-4, L4-5. Patient has failed a reasonable attempt at conservative therapy.  I explained the condition and procedure to the patient and answered any questions.  Patient wishes to proceed with procedure as planned. Understands risks/ benefits and typical outcomes of procedure.   JONES,DAVID S 09/27/2014 6:51 AM

## 2014-09-27 NOTE — Op Note (Signed)
09/27/2014  12:29 PM  PATIENT:  [name]  59 y.o. female  PRE-OPERATIVE DIAGNOSIS:  Degenerative spondylolisthesis L3-4 and L4-5 with spinal stenosis, back and leg pain  POST-OPERATIVE DIAGNOSIS:  Same  PROCEDURE:   1. Decompressive lumbar laminectomy L3-4 and L4-5 requiring more work than would be required for a simple exposure of the disk for PLIF in order to adequately decompress the neural elements and address the spinal stenosis 2. Posterior lumbar interbody fusion L3-4 and L4-5 using PEEK interbody cages packed with morcellized allograft and autograft 3. Posterior fixation L3-L5 inclusive using cortical pedicle screws.    SURGEON:  Sherley Bounds, MD  ASSISTANTS: Dr. Arnoldo Morale  ANESTHESIA:  General  EBL: 200 ml  Total I/O In: 1500 [I.V.:1500] Out: 400 [Urine:200; Blood:200]  BLOOD ADMINISTERED:none  DRAINS: Hemovac   INDICATION FOR PROCEDURE: This patient presented with severe back and leg pain. Flexion extension plain films show dynamic instability at L3-4 and L4-5. She had a grade 1 spondylolisthesis at each level. She had spinal stenosis. She tried medical management without relief. I recommended 2 level decompression and instrumented fusion to address her segmental instability and spinal stenosis. Her pain was debilitating. Patient understood the risks, benefits, and alternatives and potential outcomes and wished to proceed.  PROCEDURE DETAILS:  The patient was brought to the operating room. After induction of generalized endotracheal anesthesia the patient was rolled into the prone position on chest rolls and all pressure points were padded. The patient's lumbar region was cleaned and then prepped with DuraPrep and draped in the usual sterile fashion. Anesthesia was injected and then a dorsal midline incision was made and carried down to the lumbosacral fascia. The fascia was opened and the paraspinous musculature was taken down in a subperiosteal fashion to expose  L3-4 and L4-5. A self-retaining retractor was placed. Intraoperative fluoroscopy confirmed my level, and I started with placement of the L3 cortical pedicle screws. The pedicle screw entry zones were identified utilizing surface landmarks and  AP and lateral fluoroscopy. I scored the cortex with the high-speed drill and then used the hand drill and EMG monitoring to drill an upward and outward direction into the pedicle. I then tapped line to line, and the tap was also monitored. I then placed a 5-0 x 30 mm cortical pedicle screw into the pedicles of L3 bilaterally. I then turned my attention to the decompression and the spinous process was removed and complete lumbar laminectomies, hemi- facetectomies, and foraminotomies were performed at L3-4 and L4-5. The patient had significant spinal stenosis and this required more work than would be required for a simple exposure of the disc for posterior lumbar interbody fusion. Much more generous decompression was undertaken in order to adequately decompress the neural elements and address the patient's leg pain. The yellow ligament was removed to expose the underlying dura and nerve roots, and generous foraminotomies were performed to adequately decompress the neural elements. Both the exiting and traversing nerve roots were decompressed on both sides until a coronary dilator passed easily along the nerve roots. Once the decompression was complete, I turned my attention to the posterior lower lumbar interbody fusion. The epidural venous vasculature was coagulated and cut sharply. Disc space was incised and the initial discectomy was performed with pituitary rongeurs. The disc space was distracted with sequential distractors to a height of 10 mm. We then used a series of scrapers and shavers to prepare the endplates for fusion. The midline was prepared with Epstein curettes. Once the complete discectomy  was finished, we packed an appropriate sized peek interbody cage with  local autograft and morcellized allograft, gently retracted the nerve root, and tapped the cage into position at L3-4 and L4-5.  The midline between the cages was packed with morselized autograft and allograft. We then turned our attention to the placement of the lower pedicle screws. The pedicle screw entry zones were identified utilizing surface landmarks and fluoroscopy. I drilled into each pedicle utilizing the hand drill and EMG monitoring, and tapped each pedicle with the appropriate tap. We palpated with a ball probe to assure no break in the cortex. We then placed 5-0 x 30 mm cortical pedicle screws into the pedicles bilaterally at L4 and L5 bilaterally.  We then placed lordotic rods into the multiaxial screw heads of the pedicle screws and locked these in position with the locking caps and anti-torque device. We then checked our construct with AP and lateral fluoroscopy. Irrigated with copious amounts of bacitracin-containing saline solution. Placed a medium Hemovac drain through separate stab incision. Inspected the nerve roots once again to assure adequate decompression, lined to the dura with Gelfoam, and closed the muscle and the fascia with 0 Vicryl. Closed the subcutaneous tissues with 2-0 Vicryl and subcuticular tissues with 3-0 Vicryl. The skin was closed with benzoin and Steri-Strips. Dressing was then applied, the patient was awakened from general anesthesia and transported to the recovery room in stable condition. At the end of the procedure all sponge, needle and instrument counts were correct.   PLAN OF CARE: Admit to inpatient   PATIENT DISPOSITION:  PACU - hemodynamically stable.   Delay start of Pharmacological VTE agent (>24hrs) due to surgical blood loss or risk of bleeding:  yes

## 2014-09-28 ENCOUNTER — Encounter (HOSPITAL_COMMUNITY): Payer: Self-pay | Admitting: Neurological Surgery

## 2014-09-28 MED FILL — Sodium Chloride IV Soln 0.9%: INTRAVENOUS | Qty: 1000 | Status: AC

## 2014-09-28 MED FILL — Heparin Sodium (Porcine) Inj 1000 Unit/ML: INTRAMUSCULAR | Qty: 30 | Status: AC

## 2014-09-28 NOTE — Progress Notes (Signed)
Patient is doing very well. Her back is appropriately sore. No leg pain or numbness tingling or weakness. She is pleased. She has good strength. She has mobilized and walked with therapy. She would like to go home tomorrow. I think that is reasonable.

## 2014-09-28 NOTE — Evaluation (Signed)
Physical Therapy Evaluation Patient Details Name: [name] MRN: [id number] DOB: 1955/10/10 Today's Date: 09/28/2014   History of Present Illness  Patient admitted for PLIF L3-4, L4-5.  Clinical Impression  Pt s/p PLIF L3-4, L4-5 presenting with impairments as indicated below.  Pt able to perform functional mobility with supervision at this point.  She was able to ambulate using RW and verbal cues, requesting to keep walking because it "feels good."  Pt and family educated on spinal precautions and use of brace with good understanding and recall from earlier OT session.  Pt requires further PT services to ensure safety with functional mobility, working toward independence, and to ensure understanding and compliance with precautions.    Follow Up Recommendations No PT follow up    Equipment Recommendations  None recommended by PT    Recommendations for Other Services       Precautions / Restrictions Precautions Precautions: Back Precaution Booklet Issued: Yes (comment) Precaution Comments: Reviewed precautions using teachback method.  Pt with good recall and compliance throughout. Required Braces or Orthoses: Spinal Brace Spinal Brace: Lumbar corset;Applied in sitting position Restrictions Weight Bearing Restrictions: No      Mobility  Bed Mobility Overal bed mobility: Needs Assistance Bed Mobility: Rolling;Sidelying to Sit Rolling: Modified independent (Device/Increase time) Sidelying to sit: Supervision (verbal cues to bring legs off bed prior to sitting up)       General bed mobility comments: Pt with good recall of log rolling technique; using bed rail  Transfers Overall transfer level: Needs assistance Equipment used: Rolling walker (2 wheeled) Transfers: Sit to/from Stand Sit to Stand: Supervision (verbal cues for hand placement and to scoot to EOB)            Ambulation/Gait Ambulation/Gait assistance: Supervision Ambulation Distance (Feet): 300  Feet Assistive device: Rolling walker (2 wheeled) Gait Pattern/deviations: Step-through pattern;Decreased stride length Gait velocity: slow Gait velocity interpretation: Below normal speed for age/gender General Gait Details: Pt initially walking with step to and very guarded movements requiring verbal cues for step through and more relaxed posture as well as to increase cadence. Pt requesting to walk more, indicating that it "feels good and helps with the pain."  Stairs            Wheelchair Mobility    Modified Rankin (Stroke Patients Only)       Balance Overall balance assessment: Needs assistance Sitting-balance support: No upper extremity supported;Feet supported Sitting balance-Leahy Scale: Good (pt able to apply brace in sitting with good balance)     Standing balance support: During functional activity Standing balance-Leahy Scale: Fair                               Pertinent Vitals/Pain Pain Assessment: 0-10 Pain Score: 3  Pain Location: back (notes that leg pain she had prior to surgery has resolved) Pain Descriptors / Indicators: Aching;Operative site guarding;Grimacing Pain Intervention(s): Monitored during session    Home Living Family/patient expects to be discharged to:: Private residence Living Arrangements: Spouse/significant other Available Help at Discharge: Family Type of Home: House Home Access: Level entry (1 small step)     Home Layout: Two level;Able to live on main level with bedroom/bathroom;Other (Comment) (pt lives on 1st floor) Home Equipment: Walker - 2 wheels Additional Comments: Pt's husband has RW from prior knee surgery that pt can use    Prior Function Level of Independence: Independent  Hand Dominance   Dominant Hand: Right    Extremity/Trunk Assessment   Upper Extremity Assessment: Overall WFL for tasks assessed           Lower Extremity Assessment: Overall WFL for tasks assessed          Communication   Communication: No difficulties  Cognition Arousal/Alertness: Awake/alert Behavior During Therapy: WFL for tasks assessed/performed Overall Cognitive Status: Within Functional Limits for tasks assessed                      General Comments General comments (skin integrity, edema, etc.): Pt overall with good understanding and compliance with precautions, demonstrating recall from earlier OT session.    Exercises        Assessment/Plan    PT Assessment Patient needs continued PT services  PT Diagnosis Difficulty walking;Acute pain   PT Problem List Decreased strength;Decreased activity tolerance;Decreased balance;Decreased mobility;Decreased knowledge of use of DME;Decreased safety awareness;Decreased knowledge of precautions;Pain  PT Treatment Interventions DME instruction;Gait training;Stair training;Therapeutic activities;Functional mobility training;Therapeutic exercise;Balance training;Patient/family education   PT Goals (Current goals can be found in the Care Plan section) Acute Rehab PT Goals Patient Stated Goal: none stated PT Goal Formulation: With patient/family Time For Goal Achievement: 10/12/14 Potential to Achieve Goals: Good    Frequency Min 5X/week   Barriers to discharge        Co-evaluation               End of Session Equipment Utilized During Treatment: Gait belt Activity Tolerance: Patient tolerated treatment well Patient left: in chair;with call bell/phone within reach;with family/visitor present Nurse Communication: Mobility status         Time: 6803-2122 PT Time Calculation (min) (ACUTE ONLY): 20 min   Charges:   PT Evaluation $Initial PT Evaluation Tier I: 1 Procedure     PT G CodesEstill Bamberg Tocci Oct 15, 2014, 10:27 AM  Lorita Officer, SPT

## 2014-09-28 NOTE — Progress Notes (Signed)
Utilization review completed. Bertha Stanfill, RN, BSN. 

## 2014-09-28 NOTE — Evaluation (Addendum)
Occupational Therapy Evaluation Patient Details Name: [name] MRN: [id number] DOB: 03/02/1955 Today's Date: 09/28/2014    History of Present Illness Patient admitted for PLIF L3-4, L4-5.   Clinical Impression   Pt admitted with above and deficits below, impacting her ability to complete ADLs independently.  Pt able to recall 2/3 back precautions.  Discussed DME and AE to assist with self-care tasks and allow pt to adhere to back precautions while increasing independence.  Toilet transfers with RW and 3 in 1 with focus on safety and back precautions.  Pt very motivated to return to PLOF.  Will benefit from OT at the acute level to reach increased independence and then return home with husband.      Follow Up Recommendations  No OT follow up    Equipment Recommendations  3 in 1 bedside comode    Recommendations for Other Services       Precautions / Restrictions Precautions Precautions: Back Precaution Booklet Issued: Yes (comment) Precaution Comments: Provided handouts, Pt able to recall 2/3 back precautions Restrictions Weight Bearing Restrictions: No      Mobility Bed Mobility Overal bed mobility: Needs Assistance Bed Mobility: Rolling;Sidelying to Sit Rolling: Min guard Sidelying to sit: Min guard       General bed mobility comments: Pt with good recall of log rolling technique  Transfers Overall transfer level: Needs assistance Equipment used: Rolling walker (2 wheeled) Transfers: Sit to/from Stand Sit to Stand: Min guard                   ADL Overall ADL's : Needs assistance/impaired     Grooming: Set up;Supervision/safety               Lower Body Dressing: Set up;Minimal assistance   Toilet Transfer: Minimal assistance;Ambulation;BSC;RW   Toileting- Clothing Manipulation and Hygiene: Moderate assistance       Functional mobility during ADLs: Min guard General ADL Comments: Pt required min guard/steady assist with mobility and  transfers, secondary to back precautions pt requires mod assist with hygiene with toileting     Vision Vision Assessment?: No apparent visual deficits          Pertinent Vitals/Pain Pain Assessment: 0-10 Pain Score: 4  Pain Location: back Pain Intervention(s): Premedicated before session;Repositioned     Hand Dominance Right   Extremity/Trunk Assessment Upper Extremity Assessment Upper Extremity Assessment: Overall WFL for tasks assessed   Lower Extremity Assessment Lower Extremity Assessment: Defer to PT evaluation       Communication Communication Communication: No difficulties   Cognition Arousal/Alertness: Awake/alert Behavior During Therapy: WFL for tasks assessed/performed Overall Cognitive Status: Within Functional Limits for tasks assessed                                Home Living Family/patient expects to be discharged to:: Private residence Living Arrangements: Spouse/significant other Available Help at Discharge: Family Type of Home: House Home Access: Level entry (1 small step)     Home Layout: One level     Bathroom Shower/Tub: Occupational psychologist: Handicapped height Bathroom Accessibility: Yes How Accessible: Accessible via walker Home Equipment: [place] - 2 wheels   Additional Comments: Pt's husband has RW from prior knee surgery that pt can use      Prior Functioning/Environment Level of Independence: Independent             OT Diagnosis: Generalized weakness;Acute pain  OT Problem List: Decreased strength;Decreased range of motion;Decreased activity tolerance;Decreased knowledge of use of DME or AE;Decreased knowledge of precautions;Pain   OT Treatment/Interventions: Self-care/ADL training;DME and/or AE instruction;Therapeutic activities;Patient/family education    OT Goals(Current goals can be found in the care plan section) Acute Rehab OT Goals Patient Stated Goal: to be able to wipe myself OT Goal  Formulation: With patient Time For Goal Achievement: 10/05/14 Potential to Achieve Goals: Good ADL Goals Pt Will Perform Grooming: with modified independence;standing Pt Will Perform Upper Body Bathing: with modified independence;sitting Pt Will Perform Lower Body Bathing: with modified independence;with adaptive equipment;sitting/lateral leans Pt Will Perform Upper Body Dressing: with modified independence;sitting Pt Will Perform Lower Body Dressing: with modified independence;sit to/from stand;with adaptive equipment Pt Will Transfer to Toilet: with modified independence;ambulating;bedside commode Pt Will Perform Toileting - Clothing Manipulation and hygiene: with modified independence;with adaptive equipment;sitting/lateral leans Pt Will Perform Tub/Shower Transfer: Shower transfer;with set-up;ambulating;3 in 1;rolling walker  OT Frequency: Min 2X/week    End of Session Equipment Utilized During Treatment: Rolling walker;Back brace Nurse Communication: Mobility status  Activity Tolerance: Patient tolerated treatment well Patient left: in chair;with call bell/phone within reach;with family/visitor present   Time: 0800-0840 OT Time Calculation (min): 40 min Charges:  OT General Charges $OT Visit: 1 Procedure OT Evaluation $Initial OT Evaluation Tier I: 1 Procedure OT Treatments $Self Care/Home Management : 23-37 mins G-CodesSimonne Come, [id number] 09/28/2014, 9:01 AM

## 2014-09-29 MED ORDER — INFLUENZA VAC SPLIT QUAD 0.5 ML IM SUSY
0.5000 mL | PREFILLED_SYRINGE | INTRAMUSCULAR | Status: AC
  Administered 2014-09-29: 0.5 mL via INTRAMUSCULAR
  Filled 2014-09-29: qty 0.5

## 2014-09-29 MED ORDER — CYCLOBENZAPRINE HCL 10 MG PO TABS: 10.0000 mg | ORAL_TABLET | Freq: Three times a day (TID) | ORAL | Status: DC | PRN

## 2014-09-29 MED ORDER — OXYCODONE-ACETAMINOPHEN 5-325 MG PO TABS: 1.0000 | ORAL_TABLET | Freq: Four times a day (QID) | ORAL | Status: DC | PRN

## 2014-09-29 MED ORDER — INFLUENZA VAC SPLIT QUAD 0.5 ML IM SUSY: 0.5000 mL | PREFILLED_SYRINGE | INTRAMUSCULAR | Status: DC

## 2014-09-29 NOTE — Progress Notes (Signed)
Physical Therapy Treatment Patient Details Name: [name] MRN: [id number] DOB: 11-01-1955 Today's Date: 09/29/2014    History of Present Illness Patient admitted for PLIF L3-4, L4-5.    PT Comments    Progressing very nicely towards physical therapy goals. Reports she has single step to enter home which was practiced and she performs this task safely. Recalls 3/3 precautions and is mobilizing well without physical assist. Adequate for d/c from PT standpoint when medically ready.  Follow Up Recommendations  No PT follow up     Equipment Recommendations  None recommended by PT    Recommendations for Other Services       Precautions / Restrictions Precautions Precautions: Back Precaution Comments: Recalls 3/3 precautions Required Braces or Orthoses: Spinal Brace Spinal Brace: Lumbar corset;Applied in sitting position Restrictions Weight Bearing Restrictions: No    Mobility  Bed Mobility Overal bed mobility: Needs Assistance Bed Mobility: Rolling;Sidelying to Sit;Sit to Sidelying Rolling: Supervision Sidelying to sit: Supervision     Sit to sidelying: Supervision General bed mobility comments: Supervision for safety. Reviewed log roll. VC for technique but did not require physical assist. Practiced x2  Transfers Overall transfer level: Needs assistance Equipment used: Rolling walker (2 wheeled) Transfers: Sit to/from Stand           General transfer comment: Cues for hand placement. No physical assist required  Ambulation/Gait Ambulation/Gait assistance: Supervision Ambulation Distance (Feet): 250 Feet Assistive device: Rolling walker (2 wheeled) Gait Pattern/deviations: Step-through pattern;Decreased stride length Gait velocity: decreased Gait velocity interpretation: Below normal speed for age/gender General Gait Details: Moderately guarded but with good stability whil lightly using a rolling walker for support. VC for placement of RW for proximity  and upright posture. No buckling noted.   Stairs Stairs: Yes Stairs assistance: Supervision Stair Management: One rail Left;Step to pattern;Forwards Number of Stairs: 1 General stair comments: Practiced stair navigation holding Rail on Lt to simulate doorframe which she typically holds at home to enter her house. Performed well with VC for sequencing. No physical assist required. Husband present and observed.  Wheelchair Mobility    Modified Rankin (Stroke Patients Only)       Balance                                    Cognition Arousal/Alertness: Awake/alert Behavior During Therapy: WFL for tasks assessed/performed Overall Cognitive Status: Within Functional Limits for tasks assessed                      Exercises      General Comments        Pertinent Vitals/Pain Pain Assessment: Faces Faces Pain Scale: Hurts little more Pain Location: back Pain Descriptors / Indicators: Sore Pain Intervention(s): Monitored during session;Repositioned    Home Living                      Prior Function            PT Goals (current goals can now be found in the care plan section) Acute Rehab PT Goals Patient Stated Goal: none stated PT Goal Formulation: With patient/family Time For Goal Achievement: 10/12/14 Potential to Achieve Goals: Good Progress towards PT goals: Progressing toward goals    Frequency  Min 5X/week    PT Plan Current plan remains appropriate    Co-evaluation  End of Session Equipment Utilized During Treatment: Gait belt Activity Tolerance: Patient tolerated treatment well Patient left: with call bell/phone within reach;with family/visitor present;in bed     Time: 4373-5789 PT Time Calculation (min) (ACUTE ONLY): 13 min  Charges:  $Gait Training: 8-22 mins                    G Codes:      Ellouise Newer 2014/10/20, 11:32 AM Elayne Snare, [place]

## 2014-09-29 NOTE — Progress Notes (Signed)
Discharge orders received, Pt for discharge home today. IV d/c'd. D/c instructions and RX given with verbalized understanding. Family at bedside to assist patient with discharge. Staff bought pt downstairs via wheelchair. 09/29/14 1126

## 2014-09-29 NOTE — Discharge Summary (Signed)
Physician Discharge Summary  Patient ID: [name] MRN: [id number] DOB/AGE: 1955/07/08 59 y.o.  Admit date: 09/27/2014 Discharge date: 09/29/2014  Admission Diagnoses: lumbar stenosis and spondylolisthesis    Discharge Diagnoses: same   Discharged Condition: good  Hospital Course: The patient was admitted on 09/27/2014 and taken to the operating room where the patient underwent PLIF L3-4, l4-5. The patient tolerated the procedure well and was taken to the recovery room and then to the floor in stable condition. The hospital course was routine. There were no complications. The wound remained clean dry and intact. Pt had appropriate back soreness. No complaints of leg pain or new N/T/W. The patient remained afebrile with stable vital signs, and tolerated a regular diet. The patient continued to increase activities, and pain was well controlled with oral pain medications.   Consults: None  Significant Diagnostic Studies:  Results for orders placed or performed during the hospital encounter of 09/19/14  Surgical pcr screen  Result Value Ref Range   MRSA, PCR NEGATIVE NEGATIVE   Staphylococcus aureus NEGATIVE NEGATIVE  Hepatic function panel  Result Value Ref Range   Total Protein 7.5 6.5 - 8.1 g/dL   Albumin 4.1 3.5 - 5.0 g/dL   AST 23 15 - 41 U/L   ALT 23 14 - 54 U/L   Alkaline Phosphatase 57 38 - 126 U/L   Total Bilirubin 0.4 0.3 - 1.2 mg/dL   Bilirubin, Direct <0.1 (L) 0.1 - 0.5 mg/dL   Indirect Bilirubin NOT CALCULATED 0.3 - 0.9 mg/dL  Basic metabolic panel  Result Value Ref Range   Sodium 136 135 - 145 mmol/L   Potassium 3.5 3.5 - 5.1 mmol/L   Chloride 102 101 - 111 mmol/L   CO2 25 22 - 32 mmol/L   Glucose, Bld 92 65 - 99 mg/dL   BUN 9 6 - 20 mg/dL   Creatinine, Ser 0.83 0.44 - 1.00 mg/dL   Calcium 9.3 8.9 - 10.3 mg/dL   GFR calc non Af Amer >60 >60 mL/min   GFR calc Af Amer >60 >60 mL/min   Anion gap 9 5 - 15  CBC WITH DIFFERENTIAL  Result Value Ref Range   WBC  6.9 4.0 - 10.5 K/uL   RBC 4.60 3.87 - 5.11 MIL/uL   Hemoglobin 15.0 12.0 - 15.0 g/dL   HCT 43.8 36.0 - 46.0 %   MCV 95.2 78.0 - 100.0 fL   MCH 32.6 26.0 - 34.0 pg   MCHC 34.2 30.0 - 36.0 g/dL   RDW 12.8 11.5 - 15.5 %   Platelets 368 150 - 400 K/uL   Neutrophils Relative % 35 (L) 43 - 77 %   Neutro Abs 2.4 1.7 - 7.7 K/uL   Lymphocytes Relative 51 (H) 12 - 46 %   Lymphs Abs 3.5 0.7 - 4.0 K/uL   Monocytes Relative 7 3 - 12 %   Monocytes Absolute 0.5 0.1 - 1.0 K/uL   Eosinophils Relative 7 (H) 0 - 5 %   Eosinophils Absolute 0.5 0.0 - 0.7 K/uL   Basophils Relative 0 0 - 1 %   Basophils Absolute 0.0 0.0 - 0.1 K/uL  Protime-INR  Result Value Ref Range   Prothrombin Time 14.0 11.6 - 15.2 seconds   INR 1.06 0.00 - 1.49  Type and screen  Result Value Ref Range   ABO/RH(D) A POS    Antibody Screen NEG    Sample Expiration 10/03/2014   ABO/Rh  Result Value Ref Range   ABO/RH(D)  A POS     Chest 2 View  09/19/2014   CLINICAL DATA:  Preop spinal stenosis surgery. History of hypertension.  EXAM: CHEST  2 VIEW  COMPARISON:  05/04/2013  FINDINGS: The heart size and mediastinal contours are within normal limits. Both lungs are clear. The visualized skeletal structures are unremarkable.  IMPRESSION: No active cardiopulmonary disease.   Electronically Signed   By: Rolm Baptise M.D.   On: 09/19/2014 16:04   Dg Lumbar Spine 2-3 Views  09/27/2014   CLINICAL DATA:  Intraoperative imaging, L3-L5 fusion  EXAM: DG C-ARM 61-120 MIN; LUMBAR SPINE - 2-3 VIEW  COMPARISON:  09/07/2014 lumbar spine radiographs  FINDINGS: Utilizing the lowest visualized lumbar type vertebra as L5 level, 2 intraoperative images demonstrate apparent posterior fusion spanning L3-L5, with intervertebral disc spacers in place. Alignment is anatomic. No evidence for hardware failure.  IMPRESSION: Expected intraoperative appearance, L3-L5 posterior fusion.   Electronically Signed   By: Conchita Paris M.D.   On: 09/27/2014 12:17   Dg  C-arm 1-60 Min  09/27/2014   CLINICAL DATA:  Intraoperative imaging, L3-L5 fusion  EXAM: DG C-ARM 61-120 MIN; LUMBAR SPINE - 2-3 VIEW  COMPARISON:  09/07/2014 lumbar spine radiographs  FINDINGS: Utilizing the lowest visualized lumbar type vertebra as L5 level, 2 intraoperative images demonstrate apparent posterior fusion spanning L3-L5, with intervertebral disc spacers in place. Alignment is anatomic. No evidence for hardware failure.  IMPRESSION: Expected intraoperative appearance, L3-L5 posterior fusion.   Electronically Signed   By: Conchita Paris M.D.   On: 09/27/2014 12:17    Antibiotics:  Anti-infectives    Start     Dose/Rate Route Frequency Ordered Stop   09/27/14 1640  ceFAZolin (ANCEF) IVPB 1 g/50 mL premix     1 g 100 mL/hr over 30 Minutes Intravenous Every 8 hours 09/27/14 1441 09/28/14 0109   09/27/14 1211  vancomycin (VANCOCIN) powder  Status:  Discontinued       As needed 09/27/14 1211 09/27/14 1243   09/27/14 1200  vancomycin (VANCOCIN) 1000 MG powder    Comments:  Loreli Dollar   : cabinet override      09/27/14 1200 09/28/14 0014   09/27/14 0959  bacitracin 50,000 Units in sodium chloride irrigation 0.9 % 500 mL irrigation  Status:  Discontinued       As needed 09/27/14 1000 09/27/14 1243   09/27/14 0600  ceFAZolin (ANCEF) IVPB 2 g/50 mL premix     2 g 100 mL/hr over 30 Minutes Intravenous On call to O.R. 09/26/14 1314 09/27/14 0838      Discharge Exam: Blood pressure 130/72, pulse 61, temperature 98.2 F (36.8 C), temperature source Oral, resp. rate 20, height 5\' 7"  (1.702 m), weight 220 lb (99.791 kg), SpO2 96 %. Neurologic: Grossly normal Dressing dry  Discharge Medications:     Medication List    TAKE these medications        amLODipine-valsartan 5-160 MG per tablet  Commonly known as:  EXFORGE  Take 1 tablet by mouth daily.     aspirin 81 MG tablet  Take 81 mg by mouth daily.     CALCIUM 600+D 600-400 MG-UNIT per tablet  Generic drug:  Calcium  Carbonate-Vitamin D  Take 1 tablet by mouth daily.     cyclobenzaprine 10 MG tablet  Commonly known as:  FLEXERIL  Take 1 tablet (10 mg total) by mouth 3 (three) times daily as needed for muscle spasms.     diclofenac sodium 1 % Gel  Commonly known as:  VOLTAREN  Apply 2 g topically 4 (four) times daily as needed (pain).     estradiol 0.05 MG/24HR patch  Commonly known as:  VIVELLE-DOT  Place 1 patch onto the skin 2 (two) times a week. Apply Tuesdays and Fridays     hydrochlorothiazide 12.5 MG capsule  Commonly known as:  MICROZIDE  TAKE 1 CAPSULE DAILY     multivitamin tablet  Take 1 tablet by mouth daily.     omeprazole 40 MG capsule  Commonly known as:  PRILOSEC  Take 40 mg by mouth daily.     oxyCODONE-acetaminophen 5-325 MG per tablet  Commonly known as:  PERCOCET/ROXICET  Take 1-2 tablets by mouth every 6 (six) hours as needed for moderate pain.     traMADol 50 MG tablet  Commonly known as:  ULTRAM  Take 1-2 tablets every 6 hours as need for pain.        Disposition: home   Final Dx: PLIF L3-4, L4-5      Discharge Instructions    Call MD for:  difficulty breathing, headache or visual disturbances    Complete by:  As directed      Call MD for:  persistant nausea and vomiting    Complete by:  As directed      Call MD for:  redness, tenderness, or signs of infection (pain, swelling, redness, odor or green/yellow discharge around incision site)    Complete by:  As directed      Call MD for:  severe uncontrolled pain    Complete by:  As directed      Call MD for:  temperature >100.4    Complete by:  As directed      Diet - low sodium heart healthy    Complete by:  As directed      Discharge instructions    Complete by:  As directed   May shower, no driving, no strenuous activity or bending or twisting     Increase activity slowly    Complete by:  As directed      Remove dressing in 48 hours    Complete by:  As directed                Signed: JONES,DAVID S 09/29/2014, 7:12 AM

## 2014-09-29 NOTE — Care Management Note (Signed)
Case Management Note  Patient Details  Name: [name] MRN: [id number] Date of Birth: [birth year]  Subjective/Objective:                    Action/Plan: Patient admitted for L 3-5 fusion. Pt is from home with family. Pt being discharged with order for 3 in 1 and rolling walker. Pt already has a walker at home and refused one here. Jermaine from [facility] notified of the 3 in 1. Jermaine states that the Patients insurance will not cover the 3 in 1. CM informed the patient and family that the 3 in 1 will be private pay. Patient is willing to pay for the equipment. Jermaine aware and is going to deliver equipment to the room.  Expected Discharge Date:                  Expected Discharge Plan:  Home/Self Care  In-House Referral:     Discharge planning Services  CM Consult  Post Acute Care Choice:    Choice offered to:     DME Arranged:  3-N-1, Walker rolling DME Agency:  [place]:    [place]:     Status of Service:  Completed, signed off  Medicare Important Message Given:    Date Medicare IM Given:    Medicare IM give by:    Date Additional Medicare IM Given:    Additional Medicare Important Message give by:     If discussed at [place] of Stay Meetings, dates discussed:    Additional Comments:  Pollie Friar, RN 09/29/2014, 9:49 AM

## 2015-01-19 ENCOUNTER — Other Ambulatory Visit: Payer: Self-pay | Admitting: Family Medicine

## 2015-01-22 ENCOUNTER — Other Ambulatory Visit: Payer: Self-pay | Admitting: Family Medicine

## 2015-02-07 ENCOUNTER — Encounter: Payer: Self-pay | Admitting: Gastroenterology

## 2015-02-08 ENCOUNTER — Encounter: Payer: Self-pay | Admitting: Cardiology

## 2015-03-22 ENCOUNTER — Other Ambulatory Visit: Payer: Self-pay | Admitting: Cardiovascular Disease

## 2015-03-22 ENCOUNTER — Emergency Department (HOSPITAL_COMMUNITY): Payer: 59

## 2015-03-22 ENCOUNTER — Other Ambulatory Visit: Payer: Self-pay

## 2015-03-22 ENCOUNTER — Telehealth: Payer: Self-pay | Admitting: Nurse Practitioner

## 2015-03-22 ENCOUNTER — Encounter (HOSPITAL_COMMUNITY): Payer: Self-pay | Admitting: Emergency Medicine

## 2015-03-22 ENCOUNTER — Other Ambulatory Visit: Payer: Self-pay | Admitting: Physician Assistant

## 2015-03-22 ENCOUNTER — Emergency Department (HOSPITAL_COMMUNITY)
Admission: EM | Admit: 2015-03-22 | Discharge: 2015-03-22 | Disposition: A | Discharge status: Home / Self Care | Payer: 59 | Attending: Emergency Medicine | Admitting: Emergency Medicine

## 2015-03-22 DIAGNOSIS — Z791 Long term (current) use of non-steroidal anti-inflammatories (NSAID): Secondary | ICD-10-CM | POA: Insufficient documentation

## 2015-03-22 DIAGNOSIS — Z7982 Long term (current) use of aspirin: Secondary | ICD-10-CM | POA: Diagnosis not present

## 2015-03-22 DIAGNOSIS — Z8659 Personal history of other mental and behavioral disorders: Secondary | ICD-10-CM | POA: Diagnosis not present

## 2015-03-22 DIAGNOSIS — Z86010 Personal history of colonic polyps: Secondary | ICD-10-CM | POA: Diagnosis not present

## 2015-03-22 DIAGNOSIS — I480 Paroxysmal atrial fibrillation: Secondary | ICD-10-CM | POA: Insufficient documentation

## 2015-03-22 DIAGNOSIS — I10 Essential (primary) hypertension: Secondary | ICD-10-CM | POA: Diagnosis not present

## 2015-03-22 DIAGNOSIS — R079 Chest pain, unspecified: Secondary | ICD-10-CM | POA: Diagnosis present

## 2015-03-22 DIAGNOSIS — R0789 Other chest pain: Secondary | ICD-10-CM | POA: Diagnosis not present

## 2015-03-22 DIAGNOSIS — Z862 Personal history of diseases of the blood and blood-forming organs and certain disorders involving the immune mechanism: Secondary | ICD-10-CM | POA: Diagnosis not present

## 2015-03-22 DIAGNOSIS — K219 Gastro-esophageal reflux disease without esophagitis: Secondary | ICD-10-CM | POA: Diagnosis not present

## 2015-03-22 DIAGNOSIS — Z79899 Other long term (current) drug therapy: Secondary | ICD-10-CM | POA: Diagnosis not present

## 2015-03-22 DIAGNOSIS — J45901 Unspecified asthma with (acute) exacerbation: Secondary | ICD-10-CM | POA: Diagnosis not present

## 2015-03-22 DIAGNOSIS — Z8669 Personal history of other diseases of the nervous system and sense organs: Secondary | ICD-10-CM | POA: Diagnosis not present

## 2015-03-22 LAB — BASIC METABOLIC PANEL
Anion gap: 13 (ref 5–15)
BUN: <5 mg/dL — ABNORMAL LOW (ref 6–20)
CO2: 24 mmol/L (ref 22–32)
Calcium: 9.5 mg/dL (ref 8.9–10.3)
Chloride: 105 mmol/L (ref 101–111)
Creatinine, Ser: 0.78 mg/dL (ref 0.44–1.00)
GFR calc Af Amer: >60 mL/min (ref >60)
GFR calc non Af Amer: >60 mL/min (ref >60)
Glucose, Bld: 127 mg/dL — ABNORMAL HIGH (ref 65–99)
Potassium: 3.3 mmol/L — ABNORMAL LOW (ref 3.5–5.1)
Sodium: 142 mmol/L (ref 135–145)

## 2015-03-22 LAB — URINALYSIS, ROUTINE W REFLEX MICROSCOPIC
Bilirubin Urine: NEGATIVE
Glucose, UA: NEGATIVE mg/dL
Hgb urine dipstick: NEGATIVE
Ketones, ur: NEGATIVE mg/dL
Leukocytes, UA: NEGATIVE
Nitrite: NEGATIVE
Protein, ur: NEGATIVE mg/dL
Specific Gravity, Urine: 1.005 (ref 1.005–1.030)
pH: 7 (ref 5.0–8.0)

## 2015-03-22 LAB — CBC
HCT: 43.3 % (ref 36.0–46.0)
Hemoglobin: 14.7 g/dL (ref 12.0–15.0)
MCH: 31.7 pg (ref 26.0–34.0)
MCHC: 33.9 g/dL (ref 30.0–36.0)
MCV: 93.5 fL (ref 78.0–100.0)
Platelets: 384 10*3/uL (ref 150–400)
RBC: 4.63 MIL/uL (ref 3.87–5.11)
RDW: 13 % (ref 11.5–15.5)
WBC: 7.4 10*3/uL (ref 4.0–10.5)

## 2015-03-22 LAB — I-STAT TROPONIN, ED
Troponin i, poc: 0.01 ng/mL (ref 0.00–0.08)
Troponin i, poc: 0 ng/mL (ref 0.00–0.08)

## 2015-03-22 LAB — D-DIMER, QUANTITATIVE (NOT AT ARMC): D-Dimer, Quant: <0.27 ug/mL-FEU (ref 0.00–0.50)

## 2015-03-22 LAB — BRAIN NATRIURETIC PEPTIDE: B Natriuretic Peptide: 16.2 pg/mL (ref 0.0–100.0)

## 2015-03-22 MED ORDER — DEXTROSE 5 % IV SOLN
5.0000 mg/h | Freq: Once | INTRAVENOUS | Status: AC
  Administered 2015-03-22: 5 mg/h via INTRAVENOUS
  Filled 2015-03-22: qty 100

## 2015-03-22 MED ORDER — ASPIRIN 81 MG PO CHEW
324.0000 mg | CHEWABLE_TABLET | Freq: Once | ORAL | Status: AC
  Administered 2015-03-22: 324 mg via ORAL
  Filled 2015-03-22: qty 4

## 2015-03-22 MED ORDER — RIVAROXABAN 20 MG PO TABS: 20.0000 mg | ORAL_TABLET | Freq: Every day | ORAL | Status: DC

## 2015-03-22 MED ORDER — METOPROLOL TARTRATE 25 MG PO TABS: 25.0000 mg | ORAL_TABLET | Freq: Two times a day (BID) | ORAL | Status: DC

## 2015-03-22 NOTE — ED Provider Notes (Signed)
Consultation has been performed by Dr. Acie Fredrickson of cardiology. He has made plan at this time to treat the patient for paroxysmal atrial fibrillation with a Xarelto prescription and metoprolol. Prescriptions have been submitted by him to [facility] on [address] with a Xarelto coupon provided to the patient. His plan is for follow-up in 4-6 weeks with him. Discharge instructions and diagnosis completed as per Dr. Acie Fredrickson.  Charlesetta Shanks, MD 03/22/15 1003

## 2015-03-22 NOTE — ED Provider Notes (Signed)
CSN: [id number]     Arrival date & time 03/22/15  0302 History  By signing my name below, I, Rohini Rajnarayanan, attest that this documentation has been prepared under the direction and in the presence of Sharlett Iles, MD Electronically Signed: Evonnie Dawes, ED Scribe 03/22/2015 at 4:20 AM.   Chief Complaint  Patient presents with  . Chest Pain   The history is provided by the patient. No language interpreter was used.    HPI Comments: [name] is a 60 y.o. female with AFib, GERD, HTN, and asthma, and a pmshx of back surgery, who presents to the Emergency Department complaining of central/left, constant, non-radiating, squeezing CP with associated SOB, tachycardia, dry cough, and diaphoresis that woke her up from sleep around 2am today. She feels like she has "been on a marathon." Pt denies any new leg swelling, nausea, vomiting, fever, cough, cold, or diarrhea. Pt has not been sick recently. Pt is not currently on any blood thinners and is unaware of a previous diagnosis of atrial fibrillation. She does not follow with a cardiologist.. Pt has no hx of cardiac problems or PE/DVT or cancer. Pt has not taken any long car or plane rides recently.   Past Medical History  Diagnosis Date  . Anemia   . Atrial fibrillation (HCC)     PAF  . Headache(784.0)   . Hypertension   . GERD (gastroesophageal reflux disease)   . Gastritis   . Fatty liver   . Diverticulosis   . Internal hemorrhoids   . Depression   . OSA (obstructive sleep apnea)   . Asthma   . Adenomatous colon polyp 05/2002  . Hemorrhoids   . Spinal stenosis   . Allergy   . Difficult intubation     04/09/04-difficult intubation (unabale to pass ETT with Sabra Heck 2; unable to visualize cord with MAC 3; intubating LMA used  . PONV (postoperative nausea and vomiting)   . Family history of adverse reaction to anesthesia     sister PONV  . Wears glasses   . Diverticulitis    Past Surgical History  Procedure  Laterality Date  . Partial hysterectomy    . Appendectomy    . Breast reduction surgery    . Total abdominal hysterectomy w/ bilateral salpingoophorectomy    . Cervical fusion    . Bunionectomy    . Maximum access (mas)posterior lumbar interbody fusion (plif) 2 level N/A 09/27/2014    Procedure: Maximum Access Surgery Posterior Lumbar Interbody Fusion Lumbar three-four, Lumbar four-five;  Surgeon: Eustace Moore, MD;  Location: [facility];  Service: Neurosurgery;  Laterality: N/A;   Family History  Problem Relation Age of Onset  . Diabetes Mother   . Hypertension Mother   . Coronary artery disease Sister   . Hypertension Sister   . Colon cancer Neg Hx    Social History  Substance Use Topics  . Smoking status: Never Smoker   . Smokeless tobacco: Never Used  . Alcohol Use: No   OB History    No data available     Review of Systems 10 Systems reviewed and all are negative for acute change except as noted in the HPI.  Allergies  Tramadol  Home Medications   Prior to Admission medications   Medication Sig Start Date End Date Taking? Authorizing Provider  amLODipine-valsartan (EXFORGE) 5-160 MG tablet TAKE 1 TABLET DAILY 01/22/15  Yes [name], MD  aspirin 81 MG tablet Take 81 mg by mouth daily.  Yes Historical Provider, MD  Calcium Carbonate-Vitamin D (CALCIUM 600+D) 600-400 MG-UNIT per tablet Take 1 tablet by mouth daily.     Yes Historical Provider, MD  cyclobenzaprine (FLEXERIL) 10 MG tablet Take 1 tablet (10 mg total) by mouth 3 (three) times daily as needed for muscle spasms. 09/29/14  Yes Eustace Moore, MD  diclofenac sodium (VOLTAREN) 1 % GEL Apply 2 g topically 4 (four) times daily as needed (pain).   Yes Historical Provider, MD  hydrochlorothiazide (MICROZIDE) 12.5 MG capsule TAKE 1 CAPSULE DAILY 01/19/15  Yes [name], MD  meloxicam (MOBIC) 7.5 MG tablet Take 7.5 mg by mouth daily.   Yes Historical Provider, MD  Multiple Vitamin (MULTIVITAMIN) tablet  Take 1 tablet by mouth daily.   Yes Historical Provider, MD  omeprazole (PRILOSEC) 40 MG capsule Take 40 mg by mouth daily.   Yes Historical Provider, MD   BP 120/71 mmHg  Pulse 105  Resp 21  SpO2 95%  Physical Exam  Constitutional: She is oriented to person, place, and time. She appears well-developed and well-nourished. No distress.  HENT:  Head: Normocephalic and atraumatic.  Moist mucous membranes  Eyes: Conjunctivae are normal. Pupils are equal, round, and reactive to light.  Neck: Neck supple.  Cardiovascular: Normal heart sounds.  An irregularly irregular rhythm present. Tachycardia present.   No murmur heard. Pulmonary/Chest: Effort normal and breath sounds normal.  Abdominal: Soft. Bowel sounds are normal. She exhibits no distension. There is no tenderness.  Musculoskeletal: She exhibits no edema.  Neurological: She is alert and oriented to person, place, and time.  Fluent speech  Skin: Skin is warm and dry.  Psychiatric: She has a normal mood and affect. Judgment normal.  Nursing note and vitals reviewed.   ED Course  Procedures  DIAGNOSTIC STUDIES: Oxygen Saturation is 95% on RA, adequate by my interpretation.    COORDINATION OF CARE: 4:01 AM-Discussed treatment plan which includes an EKG, DG Chest, blood work, cardiac monitoring, and 324mg  aspirin chewable tablets with pt at bedside and pt agreed to plan.   Labs Review Labs Reviewed  BASIC METABOLIC PANEL - Abnormal; Notable for the following:    Potassium 3.3 (*)    Glucose, Bld 127 (*)    BUN <5 (*)    All other components within normal limits  CBC  BRAIN NATRIURETIC PEPTIDE  D-DIMER, QUANTITATIVE (NOT AT [facility])  URINALYSIS, ROUTINE W REFLEX MICROSCOPIC (NOT AT [facility])  I-STAT TROPOININ, ED  Randolm Idol, ED    Imaging Review Dg Chest 2 View  03/22/2015  CLINICAL DATA:  Acute onset of generalized chest pain and shortness of breath. Atrial fibrillation. Initial encounter. EXAM: CHEST  2 VIEW  COMPARISON:  Chest radiograph from 09/19/2014 FINDINGS: The lungs are well-aerated. Mild peribronchial thickening is noted. Mild vascular congestion is noted. There is no evidence of focal opacification, pleural effusion or pneumothorax. The heart is normal in size; the mediastinal contour is within normal limits. No acute osseous abnormalities are seen. Cervical spinal fusion hardware is seen. IMPRESSION: Mild peribronchial thickening noted. Mild vascular congestion noted. Lungs otherwise clear. Electronically Signed   By: Garald Balding M.D.   On: 03/22/2015 03:51   I have personally reviewed and evaluated these lab results as part of my medical decision-making.   EKG Interpretation   Date/Time:  Thursday March 22 2015 03:07:41 EDT Ventricular Rate:  121 PR Interval:    QRS Duration: 86 QT Interval:  336 QTC Calculation: 477 R Axis:   11 Text  Interpretation:  Atrial fibrillation with rapid ventricular response  Minimal voltage criteria for LVH, may be normal variant Marked ST  abnormality, possible inferior subendocardial injury Abnormal ECG A fib  new from previous EKG Confirmed by LITTLE MD, RACHEL [phone]) on 03/22/2015  6:15:39 AM     Medications  aspirin chewable tablet 324 mg (324 mg Oral Given 03/22/15 0511)  diltiazem (CARDIZEM) 100 mg in dextrose 5 % 100 mL (1 mg/mL) infusion (0 mg/hr Intravenous Stopped 03/22/15 0530)    MDM   Final diagnoses:  None   Patient presents with chest pain that woke her up with sleep associated with shortness of breath and diaphoresis tonight. She also endorses some heart racing sensation. On exam she was uncomfortable but in no acute distress. Vital signs notable for tachycardia  In 110s-120. Irregularly irregular rhythm. EKG shows atrial fibrillation with RVR. Gave the patient 324 mg aspirin and obtained above lab work. Chest x-ray negative for acute process. Gave the patient 10 mg IV diltiazem bolus to slow rate. After receiving medication, pt  later spontaneously converted to sinus rhythm. Prior to conversion to sinus, her chest pain improved. Labs show normal BNP, negative initial troponin. Discussed with cardiology, Dr. Haroldine Laws, and per his recs I have ordered a d-dimer which is negative and UA which is normal. Cardiology will evaluate the patient and disposition will be determined based on their recommendations. I'm signing out to the oncoming provider.   I personally performed the services described in this documentation, which was scribed in my presence. The recorded information has been reviewed and is accurate.      Sharlett Iles, MD 03/22/15 [phone]

## 2015-03-22 NOTE — ED Notes (Signed)
Pt. reports central/left chest pain with SOB , dry cough and diaphoresis onset this evening .

## 2015-03-22 NOTE — Telephone Encounter (Signed)
Tests and follow-up appointment scheduled

## 2015-03-22 NOTE — ED Notes (Signed)
Admitting at bedside 

## 2015-03-22 NOTE — ED Notes (Signed)
IV attempt x 2 unsuccessful.

## 2015-03-22 NOTE — Consult Note (Signed)
CARDIOLOGY CONSULT NOTE   Patient ID: [name] MRN: [id number], DOB/AGE: [birth year]   Admit date: 03/22/2015 Date of Consult: 03/22/2015   Primary Physician: [name], MD Primary Cardiologist: Dr. Burt Knack (2014)  Pt. Profile  This patient is a pleasant 60 year old African-American female with past medical history of hypertension and history of palpitation presented with afib with RVR  Problem List  Past Medical History  Diagnosis Date  . Anemia   . Atrial fibrillation (HCC)     PAF  . Headache(784.0)   . Hypertension   . GERD (gastroesophageal reflux disease)   . Gastritis   . Fatty liver   . Diverticulosis   . Internal hemorrhoids   . Depression   . OSA (obstructive sleep apnea)   . Asthma   . Adenomatous colon polyp 05/2002  . Hemorrhoids   . Spinal stenosis   . Allergy   . Difficult intubation     04/09/04-difficult intubation (unabale to pass ETT with Sabra Heck 2; unable to visualize cord with MAC 3; intubating LMA used  . PONV (postoperative nausea and vomiting)   . Family history of adverse reaction to anesthesia     sister PONV  . Wears glasses   . Diverticulitis     Past Surgical History  Procedure Laterality Date  . Partial hysterectomy    . Appendectomy    . Breast reduction surgery    . Total abdominal hysterectomy w/ bilateral salpingoophorectomy    . Cervical fusion    . Bunionectomy    . Maximum access (mas)posterior lumbar interbody fusion (plif) 2 level N/A 09/27/2014    Procedure: Maximum Access Surgery Posterior Lumbar Interbody Fusion Lumbar three-four, Lumbar four-five;  Surgeon: Eustace Moore, MD;  Location: [facility];  Service: Neurosurgery;  Laterality: N/A;     Allergies  Allergies  Allergen Reactions  . Tramadol     GI upset, "I felt like I was in a fog"    HPI   This patient is a pleasant 60 year old African-American female with past medical history of hypertension and history of palpitation. She states she had  atrial fibrillation in the past however that was several years ago. She underwent cardiac catheterization on 07/19/2008 which shows clean coronary. She was seen by Dr. Burt Knack on 04/20/2012 for palpitation, it was felt at the time that her palpitation was probably driven by menopause, stress and caffeine intake. She was advised to cut back on caffeinated drinks.   She has not seen Dr. Burt Knack since. Otherwise she has been doing very well without any recurrence of palpitation. She does not take any blood thinner and is currently on aspirin. She does have a dry cough recently which lasted more than a week, however otherwise denies any fever or chill. Her home is undergoing remodeling and she has been working on a project doing strenuous activities. She denies any exertional anginal symptom. She was in her usual state of health until she woke up around 2 AM in the morning of 03/22/2015 with palpitation, chest pressure, shortness breath, diaphoresis, and fatigue. She states she felt like she just finished running a marathon. She sought medical attention at [facility] ED. Initial EKG shows patient was in atrial fibrillation with RVR with mild ST depression in the inferolateral leads. She was given 10 mg IV Cardizem and spontaneously converted to sinus rhythm at 6:05 AM. Her chest pressure palpitation and all for her symptoms spontaneously resolved as well. At this time, she denies any chest discomfort. She  has no heart failure symptoms.   She has never taken systemic anticoagulation before, however denies any bleeding issues.     No current facility-administered medications on file prior to encounter.   Current Outpatient Prescriptions on File Prior to Encounter  Medication Sig Dispense Refill  . amLODipine-valsartan (EXFORGE) 5-160 MG tablet TAKE 1 TABLET DAILY 90 tablet 0  . aspirin 81 MG tablet Take 81 mg by mouth daily.    . Calcium Carbonate-Vitamin D (CALCIUM 600+D) 600-400 MG-UNIT per tablet Take 1  tablet by mouth daily.      . cyclobenzaprine (FLEXERIL) 10 MG tablet Take 1 tablet (10 mg total) by mouth 3 (three) times daily as needed for muscle spasms. 60 tablet 1  . diclofenac sodium (VOLTAREN) 1 % GEL Apply 2 g topically 4 (four) times daily as needed (pain).    . hydrochlorothiazide (MICROZIDE) 12.5 MG capsule TAKE 1 CAPSULE DAILY 90 capsule 1  . Multiple Vitamin (MULTIVITAMIN) tablet Take 1 tablet by mouth daily.    [name] omeprazole (PRILOSEC) 40 MG capsule Take 40 mg by mouth daily.    . [DISCONTINUED] beclomethasone (QVAR) 80 MCG/ACT inhaler Inhale 1 puff into the lungs as needed.           Family History Family History  Problem Relation Age of Onset  . Diabetes Mother   . Hypertension Mother   . Coronary artery disease Sister   . Hypertension Sister   . Colon cancer Neg Hx      Social History Social History   Social History  . Marital Status: Married    Spouse Name: N/A  . Number of Children: 2  . Years of Education: N/A   Occupational History  . [employer]    Social History Main Topics  . Smoking status: Never Smoker   . Smokeless tobacco: Never Used  . Alcohol Use: No  . Drug Use: No  . Sexual Activity: Not on file   Other Topics Concern  . Not on file   Social History Narrative     Review of Systems  General:  No chills, fever, night sweats or weight changes.  Cardiovascular:  No dyspnea on exertion, edema, orthopnea, paroxysmal nocturnal dyspnea. +chest pressure, palpitations Dermatological: No rash, lesions/masses Respiratory: +nonproductive cough, dyspnea Urologic: No hematuria, dysuria Abdominal:   No nausea, vomiting, diarrhea, bright red blood per rectum, melena, or hematemesis Neurologic:  No visual changes, changes in mental status. +wkns All other systems reviewed and are otherwise negative except as noted above.  Physical Exam  Blood pressure 112/74, pulse 74, resp. rate 16, SpO2 96 %.  General: Pleasant, NAD Psych:  Normal affect. Neuro: Alert and oriented X 3. Moves all extremities spontaneously. HEENT: Normal  Neck: Supple without bruits or JVD. Lungs:  Resp regular and unlabored, CTA. Heart: RRR no s3, s4, or murmurs. Abdomen: Soft, non-tender, non-distended, BS + x 4.  Extremities: No clubbing, cyanosis or edema. DP/PT/Radials 2+ and equal bilaterally.  Labs  No results for input(s): CKTOTAL, CKMB, TROPONINI in the last 72 hours. Lab Results  Component Value Date   WBC 7.4 03/22/2015   HGB 14.7 03/22/2015   HCT 43.3 03/22/2015   MCV 93.5 03/22/2015   PLT 384 03/22/2015    Recent Labs Lab 03/22/15 0325  NA 142  K 3.3*  CL 105  CO2 24  BUN <5*  CREATININE 0.78  CALCIUM 9.5  GLUCOSE 127*   Lab Results  Component Value Date   CHOL 133  02/07/2013   HDL 42.80 02/07/2013   LDLCALC 65 02/07/2013   TRIG 124.0 02/07/2013   Lab Results  Component Value Date   DDIMER 0.29 08/08/2011    Radiology/Studies  Dg Chest 2 View  03/22/2015  CLINICAL DATA:  Acute onset of generalized chest pain and shortness of breath. Atrial fibrillation. Initial encounter. EXAM: CHEST  2 VIEW COMPARISON:  Chest radiograph from 09/19/2014 FINDINGS: The lungs are well-aerated. Mild peribronchial thickening is noted. Mild vascular congestion is noted. There is no evidence of focal opacification, pleural effusion or pneumothorax. The heart is normal in size; the mediastinal contour is within normal limits. No acute osseous abnormalities are seen. Cervical spinal fusion hardware is seen. IMPRESSION: Mild peribronchial thickening noted. Mild vascular congestion noted. Lungs otherwise clear. Electronically Signed   By: Garald Balding M.D.   On: 03/22/2015 03:51    ECG  NSR without significant ST-T wave changes  ASSESSMENT AND PLAN  1. Paroxysmal atrial fibrillation with RVR  - This patients CHA2DS2-VASc Score and unadjusted Ischemic Stroke Rate (% per year) is equal to 2.2 % stroke rate/year from a score of  2  Above score calculated as 1 point each if present [CHF, HTN, DM, Vascular=MI/PAD/Aortic Plaque, Age if 65-74, or Female] Above score calculated as 2 points each if present [Age > 75, or Stroke/TIA/TE]  - will discuss with MD, potentially start 12.5mg  BID metoprolol to replace her HCTZ home med. Consider start Xarelto vs eliquis.   2. Chest pressure  - she had chest pressure along with palpitation during afib with RVR, which spontaneously resolved after conversion  - she does have mild ST depression in inferolateral leads during tachycardia, this has resolved as well upon conversion  - given the fact that she has clean coronaries on cath in 2010, doubt this is ACS. Likely can have outpatient lexiscan nuclear stress test.  3. HTN: on home amlodipine-valsartan and HCTZ   Signed, Almyra Deforest, PA-C 03/22/2015, 7:23 AM   Attending Note:   The patient was seen and examined.  Agree with assessment and plan as noted above.  Changes made to the above note as needed.  1. Atrial fib:   CHADS2VASC of 2. ( female , HTN) ) Has converted to NSR wit IV dilt. Agree with DOAC  -  Eliquis 5 BID  or xarelto 20 Daily  Add metoprolol for better rate control   2. Chest pain  - very atypical  OK for DC.   Will get an OP stress myoview Normal cath in 2010  3.  HTN:   Continue Exforge    Thayer Headings, Brooke Bonito., MD, [facility] 03/22/2015, 9:46 AM [address]  [place] Pager [phone]

## 2015-03-22 NOTE — Telephone Encounter (Signed)
Per Dr. Acie Fredrickson [name] will be coming for her samples.  Thanks  She will need  - echocardiogram - for PAF  Lexiscan myoview - to eval CP  Office visit in 4-6 weeks with me

## 2015-03-22 NOTE — Discharge Instructions (Signed)
Nonspecific Chest Pain  Chest pain can be caused by many different conditions. There is always a chance that your pain could be related to something serious, such as a heart attack or a blood clot in your lungs. Chest pain can also be caused by conditions that are not life-threatening. If you have chest pain, it is very important to follow up with your health care provider. CAUSES  Chest pain can be caused by:  Heartburn.  Pneumonia or bronchitis.  Anxiety or stress.  Inflammation around your heart (pericarditis) or lung (pleuritis or pleurisy).  A blood clot in your lung.  A collapsed lung (pneumothorax). It can develop suddenly on its own (spontaneous pneumothorax) or from trauma to the chest.  Shingles infection (varicella-zoster virus).  Heart attack.  Damage to the bones, muscles, and cartilage that make up your chest wall. This can include:  Bruised bones due to injury.  Strained muscles or cartilage due to frequent or repeated coughing or overwork.  Fracture to one or more ribs.  Sore cartilage due to inflammation (costochondritis). RISK FACTORS  Risk factors for chest pain may include:  Activities that increase your risk for trauma or injury to your chest.  Respiratory infections or conditions that cause frequent coughing.  Medical conditions or overeating that can cause heartburn.  Heart disease or family history of heart disease.  Conditions or health behaviors that increase your risk of developing a blood clot.  Having had chicken pox (varicella zoster). SIGNS AND SYMPTOMS Chest pain can feel like:  Burning or tingling on the surface of your chest or deep in your chest.  Crushing, pressure, aching, or squeezing pain.  Dull or sharp pain that is worse when you move, cough, or take a deep breath.  Pain that is also felt in your back, neck, shoulder, or arm, or pain that spreads to any of these areas. Your chest pain may come and go, or it may stay  constant. DIAGNOSIS Lab tests or other studies may be needed to find the cause of your pain. Your health care provider may have you take a test called an ambulatory ECG (electrocardiogram). An ECG records your heartbeat patterns at the time the test is performed. You may also have other tests, such as:  Transthoracic echocardiogram (TTE). During echocardiography, sound waves are used to create a picture of all of the heart structures and to look at how blood flows through your heart.  Transesophageal echocardiogram (TEE).This is a more advanced imaging test that obtains images from inside your body. It allows your health care provider to see your heart in finer detail.  Cardiac monitoring. This allows your health care provider to monitor your heart rate and rhythm in real time.  Holter monitor. This is a portable device that records your heartbeat and can help to diagnose abnormal heartbeats. It allows your health care provider to track your heart activity for several days, if needed.  Stress tests. These can be done through exercise or by taking medicine that makes your heart beat more quickly.  Blood tests.  Imaging tests. TREATMENT  Your treatment depends on what is causing your chest pain. Treatment may include:  Medicines. These may include:  Acid blockers for heartburn.  Anti-inflammatory medicine.  Pain medicine for inflammatory conditions.  Antibiotic medicine, if an infection is present.  Medicines to dissolve blood clots.  Medicines to treat coronary artery disease.  Supportive care for conditions that do not require medicines. This may include:  Resting.  Applying heat  or cold packs to injured areas.  Limiting activities until pain decreases. HOME CARE INSTRUCTIONS  If you were prescribed an antibiotic medicine, finish it all even if you start to feel better.  Avoid any activities that bring on chest pain.  Do not use any tobacco products, including  cigarettes, chewing tobacco, or electronic cigarettes. If you need help quitting, ask your health care provider.  Do not drink alcohol.  Take medicines only as directed by your health care provider.  Keep all follow-up visits as directed by your health care provider. This is important. This includes any further testing if your chest pain does not go away.  If heartburn is the cause for your chest pain, you may be told to keep your head raised (elevated) while sleeping. This reduces the chance that acid will go from your stomach into your esophagus.  Make lifestyle changes as directed by your health care provider. These may include:  Getting regular exercise. Ask your health care provider to suggest some activities that are safe for you.  Eating a heart-healthy diet. A registered dietitian can help you to learn healthy eating options.  Maintaining a healthy weight.  Managing diabetes, if necessary.  Reducing stress. SEEK MEDICAL CARE IF:  Your chest pain does not go away after treatment.  You have a rash with blisters on your chest.  You have a fever. SEEK IMMEDIATE MEDICAL CARE IF:   Your chest pain is worse.  You have an increasing cough, or you cough up blood.  You have severe abdominal pain.  You have severe weakness.  You faint.  You have chills.  You have sudden, unexplained chest discomfort.  You have sudden, unexplained discomfort in your arms, back, neck, or jaw.  You have shortness of breath at any time.  You suddenly start to sweat, or your skin gets clammy.  You feel nauseous or you vomit.  You suddenly feel light-headed or dizzy.  Your heart begins to beat quickly, or it feels like it is skipping beats. These symptoms may represent a serious problem that is an emergency. Do not wait to see if the symptoms will go away. Get medical help right away. Call your local emergency services (911 in the [place] Do not drive yourself to the hospital.   This  information is not intended to replace advice given to you by your health care provider. Make sure you discuss any questions you have with your health care provider.   Document Released: 10/02/2004 Document Revised: 01/13/2014 Document Reviewed: 07/29/2013 Elsevier Interactive Patient Education 2016 [employer].  Atrial Fibrillation Atrial fibrillation is a type of irregular or rapid heartbeat (arrhythmia). In atrial fibrillation, the heart quivers continuously in a chaotic pattern. This occurs when parts of the heart receive disorganized signals that make the heart unable to pump blood normally. This can increase the risk for stroke, heart failure, and other heart-related conditions. There are different types of atrial fibrillation, including:  Paroxysmal atrial fibrillation. This type starts suddenly, and it usually stops on its own shortly after it starts.  Persistent atrial fibrillation. This type often lasts longer than a week. It may stop on its own or with treatment.  Long-lasting persistent atrial fibrillation. This type lasts longer than 12 months.  Permanent atrial fibrillation. This type does not go away. Talk with your health care provider to learn about the type of atrial fibrillation that you have. CAUSES This condition is caused by some heart-related conditions or procedures, including:  A heart attack.  Coronary artery disease.  Heart failure.  Heart valve conditions.  High blood pressure.  Inflammation of the sac that surrounds the heart (pericarditis).  Heart surgery.  Certain heart rhythm disorders, such as Wolf-Parkinson-White syndrome. Other causes include:  Pneumonia.  Obstructive sleep apnea.  Blockage of an artery in the lungs (pulmonary embolism, or PE).  Lung cancer.  Chronic lung disease.  Thyroid problems, especially if the thyroid is overactive (hyperthyroidism).  Caffeine.  Excessive alcohol use or illegal drug use.  Use of some  medicines, including certain decongestants and diet pills. Sometimes, the cause cannot be found. RISK FACTORS This condition is more likely to develop in:  People who are older in age.  People who smoke.  People who have diabetes mellitus.  People who are overweight (obese).  Athletes who exercise vigorously. SYMPTOMS Symptoms of this condition include:  A feeling that your heart is beating rapidly or irregularly.  A feeling of discomfort or pain in your chest.  Shortness of breath.  Sudden light-headedness or weakness.  Getting tired easily during exercise. In some cases, there are no symptoms. DIAGNOSIS Your health care provider may be able to detect atrial fibrillation when taking your pulse. If detected, this condition may be diagnosed with:  An electrocardiogram (ECG).  A Holter monitor test that records your heartbeat patterns over a 24-hour period.  Transthoracic echocardiogram (TTE) to evaluate how blood flows through your heart.  Transesophageal echocardiogram (TEE) to view more detailed images of your heart.  A stress test.  Imaging tests, such as a CT scan or chest X-ray.  Blood tests. TREATMENT The main goals of treatment are to prevent blood clots from forming and to keep your heart beating at a normal rate and rhythm. The type of treatment that you receive depends on many factors, such as your underlying medical conditions and how you feel when you are experiencing atrial fibrillation. This condition may be treated with:  Medicine to slow down the heart rate, bring the heart's rhythm back to normal, or prevent clots from forming.  Electrical cardioversion. This is a procedure that resets your heart's rhythm by delivering a controlled, low-energy shock to the heart through your skin.  Different types of ablation, such as catheter ablation, catheter ablation with pacemaker, or surgical ablation. These procedures destroy the heart tissues that send  abnormal signals. When the pacemaker is used, it is placed under your skin to help your heart beat in a regular rhythm. HOME CARE INSTRUCTIONS  Take over-the counter and prescription medicines only as told by your health care provider.  If your health care provider prescribed a blood-thinning medicine (anticoagulant), take it exactly as told. Taking too much blood-thinning medicine can cause bleeding. If you do not take enough blood-thinning medicine, you will not have the protection that you need against stroke and other problems.  Do not use tobacco products, including cigarettes, chewing tobacco, and e-cigarettes. If you need help quitting, ask your health care provider.  If you have obstructive sleep apnea, manage your condition as told by your health care provider.  Do not drink alcohol.  Do not drink beverages that contain caffeine, such as coffee, soda, and tea.  Maintain a healthy weight. Do not use diet pills unless your health care provider approves. Diet pills may make heart problems worse.  Follow diet instructions as told by your health care provider.  Exercise regularly as told by your health care provider.  Keep all follow-up visits as told by your health care provider.  This is important. PREVENTION  Avoid drinking beverages that contain caffeine or alcohol.  Avoid certain medicines, especially medicines that are used for breathing problems.  Avoid certain herbs and herbal medicines, such as those that contain ephedra or ginseng.  Do not use illegal drugs, such as cocaine and amphetamines.  Do not smoke.  Manage your high blood pressure. SEEK MEDICAL CARE IF:  You notice a change in the rate, rhythm, or strength of your heartbeat.  You are taking an anticoagulant and you notice increased bruising.  You tire more easily when you exercise or exert yourself. SEEK IMMEDIATE MEDICAL CARE IF:  You have chest pain, abdominal pain, sweating, or weakness.  You feel  nauseous.  You notice blood in your vomit, bowel movement, or urine.  You have shortness of breath.  You suddenly have swollen feet and ankles.  You feel dizzy.  You have sudden weakness or numbness of the face, arm, or leg, especially on one side of the body.  You have trouble speaking, trouble understanding, or both (aphasia).  Your face or your eyelid droops on one side. These symptoms may represent a serious problem that is an emergency. Do not wait to see if the symptoms will go away. Get medical help right away. Call your local emergency services (911 in the [place] Do not drive yourself to the hospital.   This information is not intended to replace advice given to you by your health care provider. Make sure you discuss any questions you have with your health care provider.   Document Released: 12/23/2004 Document Revised: 09/13/2014 Document Reviewed: 04/19/2014 Elsevier Interactive Patient Education [employer].

## 2015-03-27 ENCOUNTER — Telehealth: Payer: Self-pay

## 2015-03-27 NOTE — Telephone Encounter (Signed)
Xarelto 20mg  approved by [facility] through 03/25/2016. Case ID [id number].

## 2015-04-03 ENCOUNTER — Telehealth (HOSPITAL_COMMUNITY): Payer: Self-pay | Admitting: Radiology

## 2015-04-03 NOTE — Telephone Encounter (Signed)
Patient given detailed instructions per Myocardial Perfusion Study Information Sheet for the test on 04/06/2015 at 7:15. Patient notified to arrive 15 minutes early and that it is imperative to arrive on time for appointment to keep from having the test rescheduled.  If you need to cancel or reschedule your appointment, please call the office within 24 hours of your appointment. Failure to do so may result in a cancellation of your appointment, and a $50 no show fee. Patient verbalized understanding.EK

## 2015-04-06 ENCOUNTER — Ambulatory Visit (HOSPITAL_BASED_OUTPATIENT_CLINIC_OR_DEPARTMENT_OTHER): Payer: 59

## 2015-04-06 ENCOUNTER — Ambulatory Visit (HOSPITAL_COMMUNITY): Payer: 59 | Attending: Cardiology

## 2015-04-06 ENCOUNTER — Other Ambulatory Visit: Payer: Self-pay

## 2015-04-06 DIAGNOSIS — I480 Paroxysmal atrial fibrillation: Secondary | ICD-10-CM | POA: Diagnosis not present

## 2015-04-06 DIAGNOSIS — R079 Chest pain, unspecified: Secondary | ICD-10-CM | POA: Insufficient documentation

## 2015-04-06 DIAGNOSIS — G4733 Obstructive sleep apnea (adult) (pediatric): Secondary | ICD-10-CM | POA: Insufficient documentation

## 2015-04-06 DIAGNOSIS — I071 Rheumatic tricuspid insufficiency: Secondary | ICD-10-CM | POA: Diagnosis not present

## 2015-04-06 DIAGNOSIS — I340 Nonrheumatic mitral (valve) insufficiency: Secondary | ICD-10-CM | POA: Insufficient documentation

## 2015-04-06 DIAGNOSIS — I4891 Unspecified atrial fibrillation: Secondary | ICD-10-CM | POA: Diagnosis present

## 2015-04-06 DIAGNOSIS — I119 Hypertensive heart disease without heart failure: Secondary | ICD-10-CM | POA: Insufficient documentation

## 2015-04-06 DIAGNOSIS — I358 Other nonrheumatic aortic valve disorders: Secondary | ICD-10-CM | POA: Insufficient documentation

## 2015-04-06 DIAGNOSIS — R002 Palpitations: Secondary | ICD-10-CM | POA: Diagnosis not present

## 2015-04-06 LAB — MYOCARDIAL PERFUSION IMAGING
LV dias vol: 70 mL (ref 46–106)
LV sys vol: 27 mL
Peak HR: 88 {beats}/min
RATE: 0.31
Rest HR: 59 {beats}/min
SDS: 2
SRS: 3
SSS: 5
TID: 0.92

## 2015-04-06 MED ORDER — REGADENOSON 0.4 MG/5ML IV SOLN
0.4000 mg | Freq: Once | INTRAVENOUS | Status: AC
  Administered 2015-04-06: 0.4 mg via INTRAVENOUS

## 2015-04-06 MED ORDER — TECHNETIUM TC 99M SESTAMIBI GENERIC - CARDIOLITE
11.0000 | Freq: Once | INTRAVENOUS | Status: AC | PRN
  Administered 2015-04-06: 11 via INTRAVENOUS

## 2015-04-06 MED ORDER — TECHNETIUM TC 99M SESTAMIBI GENERIC - CARDIOLITE
32.7000 | Freq: Once | INTRAVENOUS | Status: AC | PRN
  Administered 2015-04-06: 32.7 via INTRAVENOUS

## 2015-04-09 ENCOUNTER — Telehealth: Payer: Self-pay | Admitting: Cardiovascular Disease

## 2015-04-09 NOTE — Telephone Encounter (Signed)
New message ° ° °Pt is returning call for rn °

## 2015-04-10 NOTE — Telephone Encounter (Signed)
Spoke with patient and reviewed her myoview results.  I answered her questions about the diastolic dysfunction and encouraged her regarding recent dietary changes she has made.  I verified her appointment with Dr. Acie Fredrickson on 4/18.  She thanked me for the call.

## 2015-04-22 ENCOUNTER — Other Ambulatory Visit: Payer: Self-pay | Admitting: Family Medicine

## 2015-04-24 ENCOUNTER — Encounter: Payer: Self-pay | Admitting: Cardiovascular Disease

## 2015-04-24 ENCOUNTER — Ambulatory Visit (INDEPENDENT_AMBULATORY_CARE_PROVIDER_SITE_OTHER): Payer: 59 | Admitting: Cardiovascular Disease

## 2015-04-24 VITALS — BP 110/78 | HR 64 | Ht 65.0 in | Wt 222.6 lb

## 2015-04-24 DIAGNOSIS — I480 Paroxysmal atrial fibrillation: Secondary | ICD-10-CM | POA: Diagnosis not present

## 2015-04-24 HISTORY — DX: Paroxysmal atrial fibrillation: I48.0

## 2015-04-24 MED ORDER — VALSARTAN 160 MG PO TABS: 160.0000 mg | ORAL_TABLET | Freq: Every day | ORAL | Status: DC

## 2015-04-24 NOTE — Patient Instructions (Signed)
Medication Instructions:  STOP Aspirin STOP Exforge START Valsartan 160 mg once daily   Labwork: None Ordered   Testing/Procedures: None Ordered   Follow-Up: Your physician recommends that you schedule a follow-up appointment in: 3 months with Dr. Acie Fredrickson   If you need a refill on your cardiac medications before your next appointment, please call your pharmacy.   Thank you for choosing [facility]! Christen Bame, RN [phone]

## 2015-04-24 NOTE — Progress Notes (Signed)
Cardiology Office Note   Date:  04/24/2015   ID:  [name], DOB 12/19/1955, MRN [id number]  PCP:  [name], MD  Cardiologist:   Thayer Headings, MD   Chief Complaint  Patient presents with  . Follow-up    atrial fib   Problem List 1. Atrial fib - This patients CHA2DS2-VASc Score and unadjusted Ischemic Stroke Rate (% per year) is equal to 2.2 % stroke rate/year from a score of 2  Above score calculated as 1 point each if present [CHF, HTN, DM, Vascular=MI/PAD/Aortic Plaque, Age if 65-74, or Female] Above score calculated as 2 points each if present [Age > 75, or Stroke/TIA/TE]  2. Essential HTN 3.    History of Present Illness: [name] is a 60 y.o. female who presents for atrial fib, She was seen in the ER with PAF Has had a dull head ache.   Echo shows normal LV systolic function Myoview shows no ischemia     Past Medical History  Diagnosis Date  . Anemia   . Atrial fibrillation (HCC)     PAF  . Headache(784.0)   . Hypertension   . GERD (gastroesophageal reflux disease)   . Gastritis   . Fatty liver   . Diverticulosis   . Internal hemorrhoids   . Depression   . OSA (obstructive sleep apnea)   . Asthma   . Adenomatous colon polyp 05/2002  . Hemorrhoids   . Spinal stenosis   . Allergy   . Difficult intubation     04/09/04-difficult intubation (unabale to pass ETT with Sabra Heck 2; unable to visualize cord with MAC 3; intubating LMA used  . PONV (postoperative nausea and vomiting)   . Family history of adverse reaction to anesthesia     sister PONV  . Wears glasses   . Diverticulitis     Past Surgical History  Procedure Laterality Date  . Partial hysterectomy    . Appendectomy    . Breast reduction surgery    . Total abdominal hysterectomy w/ bilateral salpingoophorectomy    . Cervical fusion    . Bunionectomy    . Maximum access (mas)posterior lumbar interbody fusion (plif) 2 level N/A 09/27/2014    Procedure: Maximum  Access Surgery Posterior Lumbar Interbody Fusion Lumbar three-four, Lumbar four-five;  Surgeon: Eustace Moore, MD;  Location: [facility];  Service: Neurosurgery;  Laterality: N/A;     Current Outpatient Prescriptions  Medication Sig Dispense Refill  . Calcium Carbonate-Vitamin D (CALCIUM 600+D) 600-400 MG-UNIT per tablet Take 1 tablet by mouth daily.      . diclofenac sodium (VOLTAREN) 1 % GEL Apply 2 g topically 4 (four) times daily as needed (pain).    . hydrochlorothiazide (MICROZIDE) 12.5 MG capsule TAKE 1 CAPSULE DAILY 90 capsule 1  . meloxicam (MOBIC) 7.5 MG tablet Take 7.5 mg by mouth daily.    . metoprolol tartrate (LOPRESSOR) 25 MG tablet Take 1 tablet (25 mg total) by mouth 2 (two) times daily. 60 tablet 11  . MINIVELLE 0.05 MG/24HR patch Take 1 tablet by mouth 2 (two) times a week.    . Multiple Vitamin (MULTIVITAMIN) tablet Take 1 tablet by mouth daily.    [name] omeprazole (PRILOSEC) 40 MG capsule Take 40 mg by mouth daily.    . rivaroxaban (XARELTO) 20 MG TABS tablet Take 1 tablet (20 mg total) by mouth daily with supper. 30 tablet 11  . cyclobenzaprine (FLEXERIL) 10 MG tablet Take 1 tablet (10 mg total)  by mouth 3 (three) times daily as needed for muscle spasms. (Patient not taking: Reported on 04/24/2015) 60 tablet 1  . valsartan (DIOVAN) 160 MG tablet Take 1 tablet (160 mg total) by mouth daily. 30 tablet 11  . [DISCONTINUED] beclomethasone (QVAR) 80 MCG/ACT inhaler Inhale 1 puff into the lungs as needed.       No current facility-administered medications for this visit.    Allergies:   Tramadol    Social History:  The patient  reports that she has never smoked. She has never used smokeless tobacco. She reports that she does not drink alcohol or use illicit drugs.   Family History:  The patient's family history includes Coronary artery disease in her sister; Diabetes in her mother; Hypertension in her mother and sister. There is no history of Colon cancer.    ROS:  Please  see the history of present illness.    Review of Systems: Constitutional:  denies fever, chills, diaphoresis, appetite change and fatigue.  HEENT: denies photophobia, eye pain, redness, hearing loss, ear pain, congestion, sore throat, rhinorrhea, sneezing, neck pain, neck stiffness and tinnitus.  Respiratory: denies SOB, DOE, cough, chest tightness, and wheezing.  Cardiovascular: denies chest pain, palpitations and leg swelling.  Gastrointestinal: denies nausea, vomiting, abdominal pain, diarrhea, constipation, blood in stool.  Genitourinary: denies dysuria, urgency, frequency, hematuria, flank pain and difficulty urinating.  Musculoskeletal: denies  myalgias, back pain, joint swelling, arthralgias and gait problem.   Skin: denies pallor, rash and wound.  Neurological: denies dizziness, seizures, syncope, weakness, light-headedness, numbness and headaches.   Hematological: denies adenopathy, easy bruising, personal or family bleeding history.  Psychiatric/ Behavioral: denies suicidal ideation, mood changes, confusion, nervousness, sleep disturbance and agitation.       All other systems are reviewed and negative.    PHYSICAL EXAM: VS:  BP 110/78 mmHg  Pulse 64  Ht 5\' 5"  (1.651 m)  Wt 222 lb 9.6 oz (100.971 kg)  BMI 37.04 kg/m2  SpO2 94% , BMI Body mass index is 37.04 kg/(m^2). GEN: Well nourished, well developed, in no acute distress HEENT: normal Neck: no JVD, carotid bruits, or masses Cardiac: RRR; no murmurs, rubs, or gallops,no edema  Respiratory:  clear to auscultation bilaterally, normal work of breathing GI: soft, nontender, nondistended, + BS MS: no deformity or atrophy Skin: warm and dry, no rash Neuro:  Strength and sensation are intact Psych: normal   EKG:  EKG is not ordered today.   Recent Labs: 09/19/2014: ALT 23 03/22/2015: B Natriuretic Peptide 16.2; BUN <5*; Creatinine, Ser 0.78; Hemoglobin 14.7; Platelets 384; Potassium 3.3*; Sodium 142    Lipid  Panel    Component Value Date/Time   CHOL 133 02/07/2013 1211   TRIG 124.0 02/07/2013 1211   HDL 42.80 02/07/2013 1211   CHOLHDL 3 02/07/2013 1211   VLDL 24.8 02/07/2013 1211   LDLCALC 65 02/07/2013 1211      Wt Readings from Last 3 Encounters:  04/24/15 222 lb 9.6 oz (100.971 kg)  09/27/14 220 lb (99.791 kg)  09/19/14 222 lb (100.699 kg)      Other studies Reviewed: Additional studies/ records that were reviewed today include: . Review of the above records demonstrates:    ASSESSMENT AND PLAN:  1.  Paroxysmal atrial fibrillation: She's doing well. She's not had any further episodes of paroxysmal atrial fibrillation. Tinea current dose of metoprolol. Continue Xarelto.  2 headache: She's having headache that seems to have started about a month ago. She relates to her emergency room  visit. I do not think that the metoprolol or the Xarelto would cause any headache problems. She is not having any neurologic problems to make me think that she has an intracranial bleed.  Perhaps it's coming from her amlodipine in the Exforge. We'll discontinue the Exforge and start her on valsartan 160 mg a day. I've given her the names of several groups in town. She needs to establish herself with primary care.   Current medicines are reviewed at length with the patient today.  The patient does not have concerns regarding medicines.  The following changes have been made:  DC Exforge, start Valsartan 160   Labs/ tests ordered today include:  No orders of the defined types were placed in this encounter.     Disposition:   FU with me in several months      Danalee Flath, Wonda Cheng, MD  04/24/2015 8:04 PM    [address] [place], [place], [place]  [address] Phone: [phone]; Fax: [phone]   [facility]  [address] [place], [place]  [address] [phone]   Fax [phone]

## 2015-04-27 ENCOUNTER — Other Ambulatory Visit: Payer: Self-pay

## 2015-04-27 DIAGNOSIS — I480 Paroxysmal atrial fibrillation: Secondary | ICD-10-CM

## 2015-04-27 MED ORDER — METOPROLOL TARTRATE 25 MG PO TABS: 25.0000 mg | ORAL_TABLET | Freq: Two times a day (BID) | ORAL | Status: DC

## 2015-06-20 ENCOUNTER — Other Ambulatory Visit: Payer: Self-pay | Admitting: *Deleted

## 2015-06-20 DIAGNOSIS — I480 Paroxysmal atrial fibrillation: Secondary | ICD-10-CM

## 2015-06-20 MED ORDER — RIVAROXABAN 20 MG PO TABS: 20.0000 mg | ORAL_TABLET | Freq: Every day | ORAL | Status: DC

## 2015-06-22 ENCOUNTER — Other Ambulatory Visit: Payer: Self-pay | Admitting: *Deleted

## 2015-06-22 DIAGNOSIS — I480 Paroxysmal atrial fibrillation: Secondary | ICD-10-CM

## 2015-06-22 MED ORDER — RIVAROXABAN 20 MG PO TABS: 20.0000 mg | ORAL_TABLET | Freq: Every day | ORAL | Status: DC

## 2015-07-20 ENCOUNTER — Encounter: Payer: Self-pay | Admitting: Cardiovascular Disease

## 2015-07-20 ENCOUNTER — Ambulatory Visit (INDEPENDENT_AMBULATORY_CARE_PROVIDER_SITE_OTHER): Payer: 59 | Admitting: Cardiovascular Disease

## 2015-07-20 VITALS — BP 120/80 | HR 56 | Ht 65.0 in | Wt 221.0 lb

## 2015-07-20 DIAGNOSIS — I5032 Chronic diastolic (congestive) heart failure: Secondary | ICD-10-CM | POA: Diagnosis not present

## 2015-07-20 DIAGNOSIS — I480 Paroxysmal atrial fibrillation: Secondary | ICD-10-CM | POA: Diagnosis not present

## 2015-07-20 HISTORY — DX: Chronic diastolic (congestive) heart failure: I50.32

## 2015-07-20 NOTE — Patient Instructions (Addendum)
Check the price of Eliquis 5 mg twice a day .   Medication Instructions:  Your physician recommends you try Eliquis 5 mg twice daily in the place of Xarelto - start with 1st dose tonight  Labwork: None Ordered   Testing/Procedures: None Ordered   Follow-Up: Your physician wants you to follow-up in: 6 months with Dr. Acie Fredrickson.  You will receive a reminder letter in the mail two months in advance. If you don't receive a letter, please call our office to schedule the follow-up appointment.   If you need a refill on your cardiac medications before your next appointment, please call your pharmacy.   Thank you for choosing [facility]! Christen Bame, RN [phone]

## 2015-07-20 NOTE — Progress Notes (Signed)
Cardiology Office Note   Date:  07/20/2015   ID:  [name], DOB [birth year], MRN [id number]  PCP:  [name], MD  Cardiologist:   Mertie Moores, MD   Chief Complaint  Patient presents with  . Follow-up   Problem List 1. Atrial fib - This patients CHA2DS2-VASc Score and unadjusted Ischemic Stroke Rate (% per year) is equal to 2.2 % stroke rate/year from a score of 2  Above score calculated as 1 point each if present [CHF, HTN, DM, Vascular=MI/PAD/Aortic Plaque, Age if 65-74, or Female] Above score calculated as 2 points each if present [Age > 75, or Stroke/TIA/TE]  2. Essential HTN 3. Grade 2 diastolic dysfunction  - normal LV function    [name] is a 60 y.o. female who presents for atrial fib, She was seen in the ER with PAF Has had a dull head ache.   Echo shows normal LV systolic function Myoview shows no ischemia   July 20, 2015:  [name] is seen today for follow up visit for her paroxysmal atrial fib.  Has had 1 episode of palpitations - likely was PAF - related to stress.  Is watching the salt in her diet .  The Xarelto seems to be upsetting her stomach  Held it for several days and her stomach started bothering her again   Past Medical History  Diagnosis Date  . Anemia   . Atrial fibrillation (HCC)     PAF  . Headache(784.0)   . Hypertension   . GERD (gastroesophageal reflux disease)   . Gastritis   . Fatty liver   . Diverticulosis   . Internal hemorrhoids   . Depression   . OSA (obstructive sleep apnea)   . Asthma   . Adenomatous colon polyp 05/2002  . Hemorrhoids   . Spinal stenosis   . Allergy   . Difficult intubation     04/09/04-difficult intubation (unabale to pass ETT with Sabra Heck 2; unable to visualize cord with MAC 3; intubating LMA used  . PONV (postoperative nausea and vomiting)   . Family history of adverse reaction to anesthesia     sister PONV  . Wears glasses   . Diverticulitis     Past Surgical History    Procedure Laterality Date  . Partial hysterectomy    . Appendectomy    . Breast reduction surgery    . Total abdominal hysterectomy w/ bilateral salpingoophorectomy    . Cervical fusion    . Bunionectomy    . Maximum access (mas)posterior lumbar interbody fusion (plif) 2 level N/A 09/27/2014    Procedure: Maximum Access Surgery Posterior Lumbar Interbody Fusion Lumbar three-four, Lumbar four-five;  Surgeon: Eustace Moore, MD;  Location: [facility];  Service: Neurosurgery;  Laterality: N/A;     Current Outpatient Prescriptions  Medication Sig Dispense Refill  . Calcium Carbonate-Vitamin D (CALCIUM 600+D) 600-400 MG-UNIT per tablet Take 1 tablet by mouth daily.      . diclofenac sodium (VOLTAREN) 1 % GEL Apply 2 g topically 4 (four) times daily as needed (pain).    . hydrochlorothiazide (MICROZIDE) 12.5 MG capsule Take 12.5 mg by mouth daily.    . meloxicam (MOBIC) 7.5 MG tablet Take 7.5 mg by mouth daily.    . metoprolol tartrate (LOPRESSOR) 25 MG tablet Take 1 tablet (25 mg total) by mouth 2 (two) times daily. 180 tablet 3  . MINIVELLE 0.05 MG/24HR patch Take 1 patch by mouth 2 (two) times a week.     [name]  Multiple Vitamin (MULTIVITAMIN) tablet Take 1 tablet by mouth daily.    [name] omeprazole (PRILOSEC) 40 MG capsule Take 40 mg by mouth daily.    . rivaroxaban (XARELTO) 20 MG TABS tablet Take 1 tablet (20 mg total) by mouth daily with supper. 14 tablet 0  . valsartan (DIOVAN) 160 MG tablet Take 1 tablet (160 mg total) by mouth daily. 30 tablet 11  . [DISCONTINUED] beclomethasone (QVAR) 80 MCG/ACT inhaler Inhale 1 puff into the lungs as needed.       No current facility-administered medications for this visit.    Allergies:   Tramadol    Social History:  The patient  reports that she has never smoked. She has never used smokeless tobacco. She reports that she does not drink alcohol or use illicit drugs.   Family History:  The patient's family history includes Coronary artery disease in  her sister; Diabetes in her mother; Hypertension in her mother and sister. There is no history of Colon cancer.    ROS:  Please see the history of present illness.    Review of Systems: Constitutional:  denies fever, chills, diaphoresis, appetite change and fatigue.  HEENT: denies photophobia, eye pain, redness, hearing loss, ear pain, congestion, sore throat, rhinorrhea, sneezing, neck pain, neck stiffness and tinnitus.  Respiratory: denies SOB, DOE, cough, chest tightness, and wheezing.  Cardiovascular: denies chest pain, palpitations and leg swelling.  Gastrointestinal: denies nausea, vomiting, abdominal pain, diarrhea, constipation, blood in stool.  Genitourinary: denies dysuria, urgency, frequency, hematuria, flank pain and difficulty urinating.  Musculoskeletal: denies  myalgias, back pain, joint swelling, arthralgias and gait problem.   Skin: denies pallor, rash and wound.  Neurological: denies dizziness, seizures, syncope, weakness, light-headedness, numbness and headaches.   Hematological: denies adenopathy, easy bruising, personal or family bleeding history.  Psychiatric/ Behavioral: denies suicidal ideation, mood changes, confusion, nervousness, sleep disturbance and agitation.       All other systems are reviewed and negative.    PHYSICAL EXAM: VS:  BP 120/80 mmHg  Pulse 56  Ht 5\' 5"  (1.651 m)  Wt 221 lb (100.245 kg)  BMI 36.78 kg/m2 , BMI Body mass index is 36.78 kg/(m^2). GEN: Well nourished, well developed, in no acute distress HEENT: normal Neck: no JVD, carotid bruits, or masses Cardiac: RRR; no murmurs, rubs, or gallops,no edema  Respiratory:  clear to auscultation bilaterally, normal work of breathing GI: soft, nontender, nondistended, + BS MS: no deformity or atrophy Skin: warm and dry, no rash Neuro:  Strength and sensation are intact Psych: normal   EKG:  EKG is not ordered today.   Recent Labs: 09/19/2014: ALT 23 03/22/2015: B Natriuretic Peptide  16.2; BUN <5*; Creatinine, Ser 0.78; Hemoglobin 14.7; Platelets 384; Potassium 3.3*; Sodium 142    Lipid Panel    Component Value Date/Time   CHOL 133 02/07/2013 1211   TRIG 124.0 02/07/2013 1211   HDL 42.80 02/07/2013 1211   CHOLHDL 3 02/07/2013 1211   VLDL 24.8 02/07/2013 1211   LDLCALC 65 02/07/2013 1211      Wt Readings from Last 3 Encounters:  07/20/15 221 lb (100.245 kg)  04/24/15 222 lb 9.6 oz (100.971 kg)  09/27/14 220 lb (99.791 kg)      Other studies Reviewed: Additional studies/ records that were reviewed today include: . Review of the above records demonstrates:    ASSESSMENT AND PLAN:  1.  Paroxysmal atrial fibrillation: She's doing well. She's not had any further episodes of paroxysmal atrial fibrillation.  current dose of  metoprolol. She is having some GI upset with the Xarelto Will give her samples of Eliquis 5 mg BID  and see if she tolerates that better. If so, we will change her to the Eliquis   2 headache:  Still has some headaches - not as bad.   Current medicines are reviewed at length with the patient today.  The patient does not have concerns regarding medicines.  Labs/ tests ordered today include:  No orders of the defined types were placed in this encounter.    Disposition:   FU with me in 6 months      Mertie Moores, MD  07/20/2015 8:33 AM    [facility] [place], [place], [place]  [address] Phone: [phone]; Fax: [phone]   [facility]  [address] [place], [place]  [address] [phone]   Fax [phone]

## 2015-07-25 ENCOUNTER — Ambulatory Visit: Payer: 59 | Admitting: Cardiovascular Disease

## 2015-07-31 ENCOUNTER — Telehealth: Payer: Self-pay | Admitting: Cardiovascular Disease

## 2015-07-31 MED ORDER — APIXABAN 5 MG PO TABS: 5.0000 mg | ORAL_TABLET | Freq: Two times a day (BID) | ORAL | 0 refills | Status: DC

## 2015-07-31 MED ORDER — APIXABAN 5 MG PO TABS: 5.0000 mg | ORAL_TABLET | Freq: Two times a day (BID) | ORAL | 3 refills | Status: DC

## 2015-07-31 NOTE — Telephone Encounter (Signed)
New Message  Pt stated-was told to call back today to discuss medication with RN. Please Call Back and Discuss. wy

## 2015-07-31 NOTE — Telephone Encounter (Signed)
Spoke with pt and she states that she has not had any issues with taking the Eliquis.  Pt states that her insurance said ok to send 30 day supply to local [facility] and then send 90 day supply to her mail order pharmacy with no penalty.  Appropriate prescriptions sent to pharmacies.  Pt states that she has noticed a few more palpitations since switching to Eliquis.  Pt states that they do not last long.  Advised pt to record how often and how long they last and let us know so Dr. Acie Fredrickson can make appropriate adjustments if any are needed.  Pt states that she has not had any palps at all today.  Pt in agreement to records occurrences and let our office know.  Will route to Dr. Acie Fredrickson and his nurse to make them aware.

## 2015-08-01 NOTE — Telephone Encounter (Signed)
Spoke with patient and advised her to continue eliquis per Dr. Acie Fredrickson and that he does not believe it is the cause of her palpitations.  I advised her that low normal K+ level can cause palpitations and to increase dietary intake of K+.  We reviewed foods that are good sources and she agreed to work on diet.  I advised her to call back if she does not note improvement and that we will order bmet to determine if supplement is needed.  She verbalized understanding and agreement and thanked me for the call.

## 2015-08-01 NOTE — Telephone Encounter (Signed)
Continue Eliquis  I do not think it is causing increase palpitations

## 2015-08-02 ENCOUNTER — Telehealth: Payer: Self-pay | Admitting: Cardiovascular Disease

## 2015-08-02 ENCOUNTER — Telehealth: Payer: Self-pay

## 2015-08-02 NOTE — Telephone Encounter (Signed)
Prior auth for Eliquis 5 mg submitted to [facility]. 

## 2015-08-02 NOTE — Telephone Encounter (Signed)
Left detailed message on patient's voice mail that PA for eliquis has been submitted and that I am placing samples at the front desk for her to pick up.

## 2015-08-02 NOTE — Telephone Encounter (Signed)
New Message   pt call requesting to speak with RN. Pt states pharmacy needs a preauthorization for med Eliquis. Pt also wanted to ask if it was possible to pick up some samples of Eliquis. pt states she only has one sample left and pharmacy states its will take a few days to process. Please call back to discuss

## 2015-08-22 ENCOUNTER — Other Ambulatory Visit: Payer: Self-pay | Admitting: Family Medicine

## 2015-08-22 DIAGNOSIS — Z1231 Encounter for screening mammogram for malignant neoplasm of breast: Secondary | ICD-10-CM

## 2015-09-05 ENCOUNTER — Ambulatory Visit
Admission: RE | Admit: 2015-09-05 | Discharge: 2015-09-05 | Disposition: A | Discharge status: Home / Self Care | Payer: 59 | Source: Ambulatory Visit | Attending: Family Medicine | Admitting: Family Medicine

## 2015-09-05 DIAGNOSIS — Z1231 Encounter for screening mammogram for malignant neoplasm of breast: Secondary | ICD-10-CM

## 2015-09-16 ENCOUNTER — Other Ambulatory Visit: Payer: Self-pay | Admitting: Family Medicine

## 2015-10-20 ENCOUNTER — Other Ambulatory Visit: Payer: Self-pay | Admitting: Family Medicine

## 2015-12-04 ENCOUNTER — Telehealth: Payer: Self-pay | Admitting: Cardiovascular Disease

## 2015-12-04 NOTE — Telephone Encounter (Signed)
New message  Patient calling the office for samples of medication:   1.  What medication and dosage are you requesting samples for? Eliquis 5mg    2.  Are you currently out of this medication? Yes   Per pt voiced she is beginning to have palpitations

## 2015-12-04 NOTE — Telephone Encounter (Signed)
Patient presented to the office requesting eliquis samples. Samples given to patient.(2 Boxes) She stated that she paid an additional fee for [facility], but still has not received her medication. Pharmacy told her that she would not receive it until Friday. She is aware that Sharyn Lull will contact her to discuss the palpitations that she has been experiencing.

## 2015-12-04 NOTE — Telephone Encounter (Signed)
Left detailed message on patient's voice mail that I passed Bevelyn Buckles, CMA in the hallway who informed me that she was taking samples out to the patient.  I advised her to call back to discuss palpitations or if she has other concerns for Dr. Acie Fredrickson.

## 2015-12-07 ENCOUNTER — Telehealth: Payer: Self-pay | Admitting: Cardiovascular Disease

## 2015-12-07 MED ORDER — PROPRANOLOL HCL 10 MG PO TABS: 10.0000 mg | ORAL_TABLET | Freq: Four times a day (QID) | ORAL | 6 refills | Status: DC | PRN

## 2015-12-07 NOTE — Telephone Encounter (Signed)
Left message for patient to call back  

## 2015-12-07 NOTE — Telephone Encounter (Signed)
Returning your call . Please call on her work number [phone]

## 2015-12-07 NOTE — Telephone Encounter (Signed)
Spoke with patient who states she is having increased occurrence of palpitations.  She states she missed a few doses of eliquis but has gotten her Rx from mail order since that time and also picked up samples from our office.  She states she is working diligently on weight loss and feels like her weight is affecting her increased palpitations.  I advised her that she may be having more frequent episodes of atrial fib but it would be difficult to determine without a monitor.  I advised per Dr. Acie Fredrickson, she may start propranolol 10 mg up to 4 times per day as needed.  She asked if we have copy of sleep study from [facility].  I advised that I do not see a copy of her chart.  She will call [facility] and have a copy faxed.  I encouraged her to continue weight loss efforts and call back for additional questions or concerns prior to her January appointment.  She verbalized understanding and agreement and thanked me for the call.

## 2016-01-23 ENCOUNTER — Ambulatory Visit: Payer: 59 | Admitting: Cardiovascular Disease

## 2016-01-28 ENCOUNTER — Other Ambulatory Visit: Payer: Self-pay | Admitting: Neurological Surgery

## 2016-01-28 DIAGNOSIS — M4316 Spondylolisthesis, lumbar region: Secondary | ICD-10-CM

## 2016-02-04 ENCOUNTER — Ambulatory Visit
Admission: RE | Admit: 2016-02-04 | Discharge: 2016-02-04 | Disposition: A | Discharge status: Home / Self Care | Payer: 59 | Source: Ambulatory Visit | Attending: Neurological Surgery | Admitting: Neurological Surgery

## 2016-02-04 DIAGNOSIS — M4316 Spondylolisthesis, lumbar region: Secondary | ICD-10-CM

## 2016-03-06 ENCOUNTER — Ambulatory Visit (INDEPENDENT_AMBULATORY_CARE_PROVIDER_SITE_OTHER): Payer: 59 | Admitting: Cardiovascular Disease

## 2016-03-06 ENCOUNTER — Encounter: Payer: Self-pay | Admitting: Cardiovascular Disease

## 2016-03-06 VITALS — BP 132/76 | HR 61 | Ht 65.0 in | Wt 237.0 lb

## 2016-03-06 DIAGNOSIS — I480 Paroxysmal atrial fibrillation: Secondary | ICD-10-CM | POA: Diagnosis not present

## 2016-03-06 DIAGNOSIS — E669 Obesity, unspecified: Secondary | ICD-10-CM

## 2016-03-06 DIAGNOSIS — G4733 Obstructive sleep apnea (adult) (pediatric): Secondary | ICD-10-CM | POA: Diagnosis not present

## 2016-03-06 HISTORY — DX: Morbid (severe) obesity due to excess calories: E66.01

## 2016-03-06 LAB — COMPREHENSIVE METABOLIC PANEL
ALT: 21 IU/L (ref 0–32)
AST: 20 IU/L (ref 0–40)
Albumin/Globulin Ratio: 1.4 (ref 1.2–2.2)
Albumin: 4 g/dL (ref 3.5–5.5)
Alkaline Phosphatase: 70 IU/L (ref 39–117)
BUN/Creatinine Ratio: 13 (ref 9–23)
BUN: 10 mg/dL (ref 6–24)
Bilirubin Total: 0.4 mg/dL (ref 0.0–1.2)
CO2: 24 mmol/L (ref 18–29)
Calcium: 9.3 mg/dL (ref 8.7–10.2)
Chloride: 101 mmol/L (ref 96–106)
Creatinine, Ser: 0.78 mg/dL (ref 0.57–1.00)
GFR calc Af Amer: 97 mL/min/{1.73_m2} (ref >59)
GFR calc non Af Amer: 84 mL/min/{1.73_m2} (ref >59)
Globulin, Total: 2.9 g/dL (ref 1.5–4.5)
Glucose: 97 mg/dL (ref 65–99)
Potassium: 4.2 mmol/L (ref 3.5–5.2)
Sodium: 141 mmol/L (ref 134–144)
Total Protein: 6.9 g/dL (ref 6.0–8.5)

## 2016-03-06 LAB — TSH: TSH: 1.61 u[IU]/mL (ref 0.450–4.500)

## 2016-03-06 LAB — LIPID PANEL
Chol/HDL Ratio: 3.2 ratio units (ref 0.0–4.4)
Cholesterol, Total: 126 mg/dL (ref 100–199)
HDL: 40 mg/dL (ref >39)
LDL Calculated: 62 mg/dL (ref 0–99)
Triglycerides: 119 mg/dL (ref 0–149)
VLDL Cholesterol Cal: 24 mg/dL (ref 5–40)

## 2016-03-06 MED ORDER — APIXABAN 5 MG PO TABS
5.0000 mg | ORAL_TABLET | Freq: Two times a day (BID) | ORAL | 3 refills | Status: DC
Start: 2016-03-06 — End: 2016-03-10

## 2016-03-06 NOTE — Progress Notes (Signed)
Cardiology Office Note   Date:  03/06/2016   ID:  [name], DOB 09/04/1955, MRN [id number]  PCP:  Kandice Hams, MD  Cardiologist:   Mertie Moores, MD   Chief Complaint  Patient presents with  . Follow-up    PAF   Problem List 1. Atrial fib - This patients CHA2DS2-VASc Score and unadjusted Ischemic Stroke Rate (% per year) is equal to 2.2 % stroke rate/year from a score of 2  Above score calculated as 1 point each if present [CHF, HTN, DM, Vascular=MI/PAD/Aortic Plaque, Age if 65-74, or Female] Above score calculated as 2 points each if present [Age > 75, or Stroke/TIA/TE]  2. Essential HTN 3. Grade 2 diastolic dysfunction  - normal LV function 4. Obstructive sleep apnea -   [name] is a 61 y.o. female who presents for atrial fib, She was seen in the ER with PAF Has had a dull head ache.   Echo shows normal LV systolic function Myoview shows no ischemia   July 20, 2015:  [name] is seen today for follow up visit for her paroxysmal atrial fib.  Has had 1 episode of palpitations - likely was PAF - related to stress.  Is watching the salt in her diet .  The Xarelto seems to be upsetting her stomach  Held it for several days and her stomach started bothering her again  March 06, 2016:  Doing well  Having some back pain  Had an episode of atrial fib 3 nights ago.   Work her up . Has OAS - uses CPAP but she had pulled it off. She put the CPAP mask back on    Past Medical History:  Diagnosis Date  . Adenomatous colon polyp 05/2002  . Allergy   . Anemia   . Asthma   . Atrial fibrillation (HCC)    PAF  . Depression   . Difficult intubation    04/09/04-difficult intubation (unabale to pass ETT with Sabra Heck 2; unable to visualize cord with MAC 3; intubating LMA used  . Diverticulitis   . Diverticulosis   . Family history of adverse reaction to anesthesia    sister PONV  . Fatty liver   . Gastritis   . GERD (gastroesophageal reflux disease)   .  Headache(784.0)   . Hemorrhoids   . Hypertension   . Internal hemorrhoids   . OSA (obstructive sleep apnea)   . PONV (postoperative nausea and vomiting)   . Spinal stenosis   . Wears glasses     Past Surgical History:  Procedure Laterality Date  . APPENDECTOMY    . BREAST REDUCTION SURGERY    . BUNIONECTOMY    . CERVICAL FUSION    . MAXIMUM ACCESS (MAS)POSTERIOR LUMBAR INTERBODY FUSION (PLIF) 2 LEVEL N/A 09/27/2014   Procedure: Maximum Access Surgery Posterior Lumbar Interbody Fusion Lumbar three-four, Lumbar four-five;  Surgeon: Eustace Moore, MD;  Location: [facility];  Service: Neurosurgery;  Laterality: N/A;  . PARTIAL HYSTERECTOMY    . TOTAL ABDOMINAL HYSTERECTOMY W/ BILATERAL SALPINGOOPHORECTOMY       Current Outpatient Prescriptions  Medication Sig Dispense Refill  . apixaban (ELIQUIS) 5 MG TABS tablet Take 1 tablet (5 mg total) by mouth 2 (two) times daily. 180 tablet 3  . Calcium Carbonate-Vitamin D (CALCIUM 600+D) 600-400 MG-UNIT per tablet Take 1 tablet by mouth daily.      . diclofenac sodium (VOLTAREN) 1 % GEL Apply 2 g topically 4 (four) times daily as needed (pain).    [name]  hydrochlorothiazide (MICROZIDE) 12.5 MG capsule TAKE 1 CAPSULE DAILY 90 capsule 0  . meloxicam (MOBIC) 7.5 MG tablet Take 7.5 mg by mouth daily.    . metoprolol tartrate (LOPRESSOR) 25 MG tablet Take 1 tablet (25 mg total) by mouth 2 (two) times daily. 180 tablet 3  . MINIVELLE 0.05 MG/24HR patch Take 1 patch by mouth 2 (two) times a week.     . Multiple Vitamin (MULTIVITAMIN) tablet Take 1 tablet by mouth daily.    . valsartan (DIOVAN) 160 MG tablet Take 1 tablet (160 mg total) by mouth daily. 30 tablet 11   No current facility-administered medications for this visit.     Allergies:   Tramadol    Social History:  The patient  reports that she has never smoked. She has never used smokeless tobacco. She reports that she does not drink alcohol or use drugs.   Family History:  The patient's  family history includes Coronary artery disease in her sister; Diabetes in her mother; Hypertension in her mother and sister.    ROS:  Please see the history of present illness.    Review of Systems: Constitutional:  denies fever, chills, diaphoresis, appetite change and fatigue.  HEENT: denies photophobia, eye pain, redness, hearing loss, ear pain, congestion, sore throat, rhinorrhea, sneezing, neck pain, neck stiffness and tinnitus.  Respiratory: denies SOB, DOE, cough, chest tightness, and wheezing.  Cardiovascular: denies chest pain, palpitations and leg swelling.  Gastrointestinal: denies nausea, vomiting, abdominal pain, diarrhea, constipation, blood in stool.  Genitourinary: denies dysuria, urgency, frequency, hematuria, flank pain and difficulty urinating.  Musculoskeletal: denies  myalgias, back pain, joint swelling, arthralgias and gait problem.   Skin: denies pallor, rash and wound.  Neurological: denies dizziness, seizures, syncope, weakness, light-headedness, numbness and headaches.   Hematological: denies adenopathy, easy bruising, personal or family bleeding history.  Psychiatric/ Behavioral: denies suicidal ideation, mood changes, confusion, nervousness, sleep disturbance and agitation.       All other systems are reviewed and negative.    PHYSICAL EXAM: VS:  BP 132/76   Pulse 61   Ht _0  (1.651 m)   Wt 237 lb (107.5 kg)   SpO2 96%   BMI 39.44 kg/m  , BMI Body mass index is 39.44 kg/m. GEN: Well nourished, well developed, in no acute distress  HEENT: normal  Neck: no JVD, carotid bruits, or masses Cardiac: RRR; no murmurs, rubs, or gallops,no edema  Respiratory:  clear to auscultation bilaterally, normal work of breathing GI: soft, nontender, nondistended, + BS MS: no deformity or atrophy  Skin: warm and dry, no rash Neuro:  Strength and sensation are intact Psych: normal   EKG:  EKG is ordered today.  Personally reviewed  NSR at 62.   PACs.  NS ST  abn.    Recent Labs: 03/22/2015: B Natriuretic Peptide 16.2; BUN <5; Creatinine, Ser 0.78; Hemoglobin 14.7; Platelets 384; Potassium 3.3; Sodium 142    Lipid Panel    Component Value Date/Time   CHOL 133 02/07/2013 1211   TRIG 124.0 02/07/2013 1211   HDL 42.80 02/07/2013 1211   CHOLHDL 3 02/07/2013 1211   VLDL 24.8 02/07/2013 1211   LDLCALC 65 02/07/2013 1211      Wt Readings from Last 3 Encounters:  03/06/16 237 lb (107.5 kg)  07/20/15 221 lb (100.2 kg)  04/24/15 222 lb 9.6 oz (101 kg)      Other studies Reviewed: Additional studies/ records that were reviewed today include: . Review of the above records demonstrates:  ASSESSMENT AND PLAN:  1.  Paroxysmal atrial fibrillation: She's doing well. She's not had any further episodes of paroxysmal atrial fibrillation.  current dose of metoprolol.  She continues to have intermittent episodes of paroxysmal atrial fibrillation. Her last one was Monday night and was related to obstructive sleep apnea. She had taken off her CPAP mask in the middle of the night.  I have encouraged her to continue using her CPAP mask. We will check a TSH today. We'll also check a basic medical profile and liver enzymes.  I've encouraged her to work on an exercise program. I'll see her in 6 month.  2 headache:  Still has some headaches - not as bad.   Current medicines are reviewed at length with the patient today.  The patient does not have concerns regarding medicines.  Labs/ tests ordered today include:  No orders of the defined types were placed in this encounter.   Disposition:   FU with me in 6 months      Mertie Moores, MD  03/06/2016 8:03 AM    [facility] [place], [place], [place]  [address] Phone: [phone]; Fax: [phone]

## 2016-03-06 NOTE — Patient Instructions (Signed)
Medication Instructions:  Your physician recommends that you continue on your current medications as directed. Please refer to the Current Medication list given to you today.   Labwork: TODAY - TSH, complete metabolic panel, cholesterol   Testing/Procedures: None Ordered   Follow-Up: Your physician wants you to follow-up in: 6 months with Dr. Nahser.  You will receive a reminder letter in the mail two months in advance. If you don't receive a letter, please call our office to schedule the follow-up appointment.   If you need a refill on your cardiac medications before your next appointment, please call your pharmacy.   Thank you for choosing [facility]! Michelle Swinyer, RN [phone]    

## 2016-03-10 ENCOUNTER — Telehealth: Payer: Self-pay | Admitting: Cardiovascular Disease

## 2016-03-10 MED ORDER — APIXABAN 5 MG PO TABS: 5.0000 mg | ORAL_TABLET | Freq: Two times a day (BID) | ORAL | 3 refills | Status: DC

## 2016-03-10 NOTE — Telephone Encounter (Signed)
Spoke to patient as rx sent to [facility] on 03/07/16. She reports she can no longer fill with [facility] and needs sent to [facility] on [address]. This has been done and [facility] removed from preferred pharmacies.

## 2016-03-10 NOTE — Telephone Encounter (Signed)
New message    *STAT* If patient is at the pharmacy, call can be transferred to refill team.   1. Which medications need to be refilled? (please list name of each medication and dose if known) apixaban (ELIQUIS) 5 MG TABS tablet  2. Which pharmacy/location (including street and city if local pharmacy) is medication to be sent to? [facility] on [address]  3. Do they need a 30 day or 90 day supply? 90 day

## 2016-03-10 NOTE — Telephone Encounter (Signed)
New Message   Pt requesting blood test results would like a call back

## 2016-03-10 NOTE — Telephone Encounter (Signed)
Spoke with patient who requests copy of lab work be mailed to her. I have placed copies in outgoing mail. She thanked me for the call.

## 2016-03-20 ENCOUNTER — Ambulatory Visit (HOSPITAL_COMMUNITY)
Admission: RE | Admit: 2016-03-20 | Discharge: 2016-03-20 | Disposition: A | Discharge status: Home / Self Care | Payer: 59 | Source: Ambulatory Visit | Attending: Internal Medicine | Admitting: Internal Medicine

## 2016-03-20 ENCOUNTER — Other Ambulatory Visit (HOSPITAL_COMMUNITY): Payer: Self-pay | Admitting: Internal Medicine

## 2016-03-20 DIAGNOSIS — M79602 Pain in left arm: Secondary | ICD-10-CM

## 2016-03-20 DIAGNOSIS — M79622 Pain in left upper arm: Secondary | ICD-10-CM | POA: Insufficient documentation

## 2016-03-20 DIAGNOSIS — R2232 Localized swelling, mass and lump, left upper limb: Secondary | ICD-10-CM | POA: Diagnosis not present

## 2016-03-20 DIAGNOSIS — M7989 Other specified soft tissue disorders: Secondary | ICD-10-CM | POA: Diagnosis present

## 2016-03-26 ENCOUNTER — Telehealth: Payer: Self-pay | Admitting: Family Medicine

## 2016-03-26 NOTE — Telephone Encounter (Signed)
Spoke with patient and scheduled for sooner appointment.

## 2016-03-26 NOTE — Telephone Encounter (Signed)
Patient called to establish care with [name]. She was originally with [facility] with Dr. Marlyn Corporal however, Epic has her PCP listed as Dr. Delfina Redwood with [facility].   Patient explained that there was a lump found in her arm and has yet to hear back about the CAT scan. Pateint is displeased with the service she has received from [facility] and [facility] and is excited for a fresh start with our practice.   Patient is scheduled for Monday at 03/31/16 at 10am.

## 2016-03-26 NOTE — Telephone Encounter (Signed)
Noted  

## 2016-03-27 ENCOUNTER — Ambulatory Visit (INDEPENDENT_AMBULATORY_CARE_PROVIDER_SITE_OTHER): Payer: 59 | Admitting: Family Medicine

## 2016-03-27 ENCOUNTER — Encounter: Payer: Self-pay | Admitting: Family Medicine

## 2016-03-27 VITALS — BP 132/82 | HR 61 | Temp 98.3°F | Ht 65.0 in | Wt 238.2 lb

## 2016-03-27 DIAGNOSIS — R2232 Localized swelling, mass and lump, left upper limb: Secondary | ICD-10-CM | POA: Diagnosis not present

## 2016-03-27 DIAGNOSIS — N951 Menopausal and female climacteric states: Secondary | ICD-10-CM | POA: Diagnosis not present

## 2016-03-27 DIAGNOSIS — F339 Major depressive disorder, recurrent, unspecified: Secondary | ICD-10-CM

## 2016-03-27 NOTE — Progress Notes (Signed)
Pre visit review using our clinic review tool, if applicable. No additional management support is needed unless otherwise documented below in the visit note. 

## 2016-03-27 NOTE — Progress Notes (Signed)
[name] is a 61 y.o. female is here to [name].   History of Present Illness:   Water quality scientist, CMA, acting as scribe for Dr. Juleen [name].  CC: Patient is here today to establish care.  Also would like recent imaging results performed on her left arm.  States she has a "mass" in her left arm that is very painful to touch.  No bruising or swelling.  Patient discovered this mass approximately 1 month ago.  No other complaints today.  HPI: As above. Significant time spent going over patient history and updated problem list as well.  Health Maintenance Due  Topic Date Due  . Hepatitis C Screening  07/20/1955  . HIV Screening  04/19/1972    PMHx, SurgHx, SocialHx, Medications, and Allergies were reviewed in the Visit Navigator and updated as appropriate.   Past Medical History:  Diagnosis Date  . Allergic rhinitis 04/03/2009  . Anemia   . Asthma, mild, intermittent 01/23/2009  . Back pain 08/03/2012  . 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 Chronic diastolic CHF (congestive heart failure) ([name]) 07/20/2015  . Depression, recurrent ([name]) 03/03/2007  . Difficult intubation    04/09/04 with difficult intubation. Unabale to pass ETT with Sabra Heck 2; unable to visualize cord with MAC 3; intubating [name].  . Diverticulosis, with history of diverticulitis of descending and sigmoid colon 03/03/2007  . Essential hypertension 06/25/2006  . Gastritis   . GERD 03/03/2007  . Hepatic steatosis, found on CT 03/03/2007  . Hiatal hernia, small 03/28/2016   EGD by [name] 12/09/12.   [name] History of colonic polyps 04/21/2008   Last Colonoscopy was 12/09/12, Dr. Fuller [name]. No polyp at that time. Repeat in 5 years.   . Internal hemorrhoids 03/03/2007   Last Colonoscopy was 12/09/12, Dr. Fuller [name]. Repeat in 5 years.   . Left adrenal mass ([name]) 05/12/2013  . Menopausal vasomotor syndrome 03/28/2016  . Morbid obesity ([name]) (BMI 35 plus 2 comorbidities) 03/06/2016   Comorbid conditions include HTN, HLD, hepatic  steatosis, OA, GERD, OSA.  . OSA (obstructive sleep apnea) 03/03/2007   [name].   [name] PAF (paroxysmal atrial fibrillation) ([name]) 04/24/2015  . PONV (postoperative nausea and vomiting)   . Umbilical hernia without obstruction or gangrene, fat containing 03/28/2016   Found on CT, 2017. No issues.    Past Surgical History:  Procedure Laterality Date  . APPENDECTOMY    . BREAST REDUCTION SURGERY    . BUNIONECTOMY    . CERVICAL FUSION    . MAXIMUM ACCESS (MAS)POSTERIOR LUMBAR INTERBODY FUSION (PLIF) 2 LEVEL N/A 09/27/2014  . PARTIAL HYSTERECTOMY    . SPINE SURGERY    . TOTAL ABDOMINAL HYSTERECTOMY W/ BILATERAL SALPINGOOPHORECTOMY      Family History  Problem Relation Age of Onset  . Diabetes Mother   . Hypertension Mother   . Coronary artery disease Sister   . Hypertension Sister   . Colon cancer Neg Hx     Social History  Substance Use Topics  . Smoking status: Never Smoker  . Smokeless tobacco: Never [name]  . Alcohol use No    Current Medications and Allergies:   .  apixaban (ELIQUIS) 5 MG TABS tablet, Take 1 tablet (5 mg total) by mouth 2 (two) times daily., Disp: 180 tablet, Rfl: 3 .  Calcium Carbonate-Vitamin D (CALCIUM 600+D) 600-400 MG-UNIT per tablet, Take 1 tablet by mouth daily.  , Disp: , Rfl:  .  hydrochlorothiazide (MICROZIDE) 12.5 MG capsule, TAKE 1 CAPSULE DAILY,  Disp: 90 capsule, Rfl: 0 .  metoprolol tartrate (LOPRESSOR) 25 MG tablet, Take 1 tablet (25 mg total) by mouth 2 (two) times daily., Disp: 180 tablet, Rfl: 3 .  MINIVELLE 0.05 MG/24HR patch, Take 1 patch by mouth 2 (two) times a week. , Disp: , Rfl:  .  Multiple Vitamin (MULTIVITAMIN) tablet, Take 1 tablet by mouth daily., Disp: , Rfl:  .  valsartan (DIOVAN) 160 MG tablet, Take 1 tablet (160 mg total) by mouth daily., Disp: 30 tablet, Rfl: 11   Allergies  Allergen Reactions  . Tramadol     GI upset, "I felt like I was in a fog"     Review of Systems:   Review of Systems  Constitutional:  Negative for chills and fever.  HENT: Positive for congestion.   Eyes: Negative for blurred vision.  Respiratory: Positive for cough.   Cardiovascular: Positive for palpitations. Negative for chest pain.  Gastrointestinal: Negative for abdominal pain, nausea and vomiting.  Genitourinary: Negative for frequency.  Musculoskeletal: Negative for back pain.  Neurological: Negative for loss of consciousness and headaches.  Psychiatric/Behavioral: Negative for depression.    Vitals:   Vitals:   03/27/16 1034  BP: 132/82  Pulse: 61  Temp: 98.3 F (36.8 C)  TempSrc: Oral  SpO2: 97%  Weight: 238 lb 3.2 oz (108 kg)  Height: _0  (1.651 m)     Body mass index is 39.64 kg/m.   Physical Exam:   Physical Exam  Constitutional: She appears well-nourished.  HENT:  Head: Normocephalic and atraumatic.  Eyes: EOM are normal. Pupils are equal, round, and reactive to light.  Neck: Normal range of motion. Neck supple.  Cardiovascular: Normal rate.   Pulmonary/Chest: Effort normal.  Abdominal: Soft.  Skin: Skin is warm.  Quarter-sized, tender, palpable mass left anterior forearm without skin changes.  Psychiatric: She has a normal mood and affect. Her behavior is normal.  Nursing note and vitals reviewed.    Assessment and [name]:   [name] was seen today for establish care and left arm mass.  Diagnoses and all orders for this visit:  Forearm mass, left Comments: Imaging reviewed with patient. Referral to General surgery placed.  Orders: -     Ambulatory referral to General Surgery  Depression, recurrent ([place]) Comments: Will review starting a medication in the next 1-2 months after the patient discussed stopping HRT with GYN.  Morbid obesity (HCC) (BMI 35 plus 2 comorbidities) Comments: Reviewed [name] for weight loss over the next year, including checking insulin level, eating a plant based-low carbohydrate diet, regular exercise, and weight loss medications if appropriate.    Menopausal vasomotor syndrome Comments: On HRT. Encouraged patient to speak with her GYN to see how long it will be safe for to continue versus SSRI/SNRI.   [name] Reviewed expectations re: course of current medical issues. . Discussed self-management of symptoms. . Outlined signs and symptoms indicating need for more acute intervention. . Patient verbalized understanding and all questions were answered. . See orders for this visit as documented in the electronic medical record. . Patient received an After Visit Summary.  Records requested if needed. I spent 30 minutes with this patient, greater than 50% was face-to-face time counseling regarding the above diagnoses.  CMA served as Education administrator during this visit. History, Physical, and [name] performed by medical provider. Documentation and orders reviewed and attested to. Briscoe Deutscher, D.O.  Briscoe Deutscher, [place] [place] 03/28/2016  Follow-up: 3 months.  No orders of the defined types  were placed in this encounter.  Medications Discontinued During This Encounter  Medication Reason  . diclofenac sodium (VOLTAREN) 1 % GEL Error  . meloxicam (MOBIC) 7.5 MG tablet Error   Orders Placed This Encounter  Procedures  . Ambulatory referral to General Surgery

## 2016-03-28 ENCOUNTER — Encounter: Payer: Self-pay | Admitting: Family Medicine

## 2016-03-28 ENCOUNTER — Telehealth: Payer: Self-pay | Admitting: Family Medicine

## 2016-03-28 DIAGNOSIS — K449 Diaphragmatic hernia without obstruction or gangrene: Secondary | ICD-10-CM

## 2016-03-28 DIAGNOSIS — K429 Umbilical hernia without obstruction or gangrene: Secondary | ICD-10-CM

## 2016-03-28 DIAGNOSIS — Z981 Arthrodesis status: Secondary | ICD-10-CM | POA: Insufficient documentation

## 2016-03-28 DIAGNOSIS — Z90710 Acquired absence of both cervix and uterus: Secondary | ICD-10-CM | POA: Insufficient documentation

## 2016-03-28 DIAGNOSIS — R2232 Localized swelling, mass and lump, left upper limb: Secondary | ICD-10-CM | POA: Insufficient documentation

## 2016-03-28 DIAGNOSIS — Z9049 Acquired absence of other specified parts of digestive tract: Secondary | ICD-10-CM | POA: Insufficient documentation

## 2016-03-28 DIAGNOSIS — N951 Menopausal and female climacteric states: Secondary | ICD-10-CM

## 2016-03-28 HISTORY — DX: Diaphragmatic hernia without obstruction or gangrene: K44.9

## 2016-03-28 HISTORY — DX: Umbilical hernia without obstruction or gangrene: K42.9

## 2016-03-28 HISTORY — DX: Menopausal and female climacteric states: N95.1

## 2016-03-28 NOTE — Assessment & Plan Note (Signed)
Lipid Panel     Component Value Date/Time   CHOL 126 03/06/2016 0819   TRIG 119 03/06/2016 0819   HDL 40 03/06/2016 0819   CHOLHDL 3.2 03/06/2016 0819   CHOLHDL 3 02/07/2013 1211   VLDL 24.8 02/07/2013 1211   LDLCALC 62 03/06/2016 0819

## 2016-03-28 NOTE — Assessment & Plan Note (Addendum)
Home blood pressure readings at goal. Avoiding excessive salt intake. Denies chest pain. Taking medications as prescribed without side effects.   Wt Readings from Last 3 Encounters:  03/27/16 238 lb 3.2 oz (108 kg)  03/06/16 237 lb (107.5 kg)  07/20/15 221 lb (100.2 kg)   Reports that she has never smoked.   BP Readings from Last 3 Encounters:  03/27/16 132/82  03/06/16 132/76  07/20/15 120/80   Lab Results  Component Value Date   CREATININE 0.78 03/06/2016

## 2016-03-28 NOTE — Assessment & Plan Note (Signed)
Reviewed imaging results with patient. Referral to General Surgery for consultation.

## 2016-03-28 NOTE — Assessment & Plan Note (Addendum)
On HRT at this time. Followed by Dr. Garwin Brothers with OBGYN. Will discuss with her next month re: weaning and starting SSRI/SNRI.

## 2016-03-28 NOTE — Telephone Encounter (Signed)
Patient sent a Mychart message to be scheduled for a hepatitis c screen between April 9th-April 30th. Please call patient to advise.

## 2016-03-28 NOTE — Assessment & Plan Note (Signed)
Depression symptoms: anxiety, sadness, trouble concentraing.  Current psychosocial stressors include: health issues, vasomotor symptoms.  Treatment to date has included HRT. Symptoms have been gradually worsening since onset of treatment.  Alcohol use: NO.  Drug use: NO.  Exercise: NO.  Patient denies current suicidal and homicidal ideation.

## 2016-03-28 NOTE — Assessment & Plan Note (Signed)
Stable. Needs to be considered during respiratory illnesses.

## 2016-03-31 ENCOUNTER — Ambulatory Visit: Payer: 59 | Admitting: Family Medicine

## 2016-03-31 NOTE — Telephone Encounter (Signed)
Left message for patient to return call.  Also sent MyChart message.  Notified patient on VM that I was sending her MyChart message.

## 2016-04-01 NOTE — Telephone Encounter (Signed)
See MyChart messages for response.

## 2016-04-06 ENCOUNTER — Other Ambulatory Visit: Payer: Self-pay | Admitting: Cardiovascular Disease

## 2016-04-06 DIAGNOSIS — I480 Paroxysmal atrial fibrillation: Secondary | ICD-10-CM

## 2016-04-22 ENCOUNTER — Ambulatory Visit (INDEPENDENT_AMBULATORY_CARE_PROVIDER_SITE_OTHER): Payer: 59 | Admitting: Family Medicine

## 2016-04-22 ENCOUNTER — Encounter: Payer: Self-pay | Admitting: Family Medicine

## 2016-04-22 VITALS — BP 136/82 | HR 61 | Temp 97.9°F | Ht 65.0 in | Wt 239.2 lb

## 2016-04-22 DIAGNOSIS — F339 Major depressive disorder, recurrent, unspecified: Secondary | ICD-10-CM | POA: Diagnosis not present

## 2016-04-22 DIAGNOSIS — N951 Menopausal and female climacteric states: Secondary | ICD-10-CM | POA: Diagnosis not present

## 2016-04-22 DIAGNOSIS — K760 Fatty (change of) liver, not elsewhere classified: Secondary | ICD-10-CM | POA: Diagnosis not present

## 2016-04-22 DIAGNOSIS — J301 Allergic rhinitis due to pollen: Secondary | ICD-10-CM | POA: Diagnosis not present

## 2016-04-22 DIAGNOSIS — R7303 Prediabetes: Secondary | ICD-10-CM | POA: Diagnosis not present

## 2016-04-22 DIAGNOSIS — R2232 Localized swelling, mass and lump, left upper limb: Secondary | ICD-10-CM | POA: Diagnosis not present

## 2016-04-22 LAB — COMPREHENSIVE METABOLIC PANEL
ALT: 22 U/L (ref 0–35)
AST: 20 U/L (ref 0–37)
Albumin: 3.9 g/dL (ref 3.5–5.2)
Alkaline Phosphatase: 62 U/L (ref 39–117)
BUN: 12 mg/dL (ref 6–23)
CO2: 27 mEq/L (ref 19–32)
Calcium: 9.7 mg/dL (ref 8.4–10.5)
Chloride: 103 mEq/L (ref 96–112)
Creatinine, Ser: 0.71 mg/dL (ref 0.40–1.20)
GFR: 108.36 mL/min (ref >60.00)
Glucose, Bld: 77 mg/dL (ref 70–99)
Potassium: 3.7 mEq/L (ref 3.5–5.1)
Sodium: 138 mEq/L (ref 135–145)
Total Bilirubin: 0.4 mg/dL (ref 0.2–1.2)
Total Protein: 7.2 g/dL (ref 6.0–8.3)

## 2016-04-22 LAB — CBC WITH DIFFERENTIAL/PLATELET
Basophils Absolute: 0 10*3/uL (ref 0.0–0.1)
Basophils Relative: 0.8 % (ref 0.0–3.0)
Eosinophils Absolute: 0.1 10*3/uL (ref 0.0–0.7)
Eosinophils Relative: 2.1 % (ref 0.0–5.0)
HCT: 41.6 % (ref 36.0–46.0)
Hemoglobin: 13.9 g/dL (ref 12.0–15.0)
Lymphocytes Relative: 47.2 % — ABNORMAL HIGH (ref 12.0–46.0)
Lymphs Abs: 2.7 10*3/uL (ref 0.7–4.0)
MCHC: 33.4 g/dL (ref 30.0–36.0)
MCV: 97.6 fl (ref 78.0–100.0)
Monocytes Absolute: 0.6 10*3/uL (ref 0.1–1.0)
Monocytes Relative: 10.2 % (ref 3.0–12.0)
Neutro Abs: 2.3 10*3/uL (ref 1.4–7.7)
Neutrophils Relative %: 39.7 % — ABNORMAL LOW (ref 43.0–77.0)
Platelets: 359 10*3/uL (ref 150.0–400.0)
RBC: 4.26 Mil/uL (ref 3.87–5.11)
RDW: 13.6 % (ref 11.5–15.5)
WBC: 5.7 10*3/uL (ref 4.0–10.5)

## 2016-04-22 LAB — HEMOGLOBIN A1C: Hgb A1c MFr Bld: 5.8 % (ref 4.6–6.5)

## 2016-04-22 LAB — TSH: TSH: 1.2 u[IU]/mL (ref 0.35–4.50)

## 2016-04-22 LAB — LIPID PANEL
Cholesterol: 129 mg/dL (ref 0–200)
HDL: 41.8 mg/dL (ref >39.00)
LDL Cholesterol: 67 mg/dL (ref 0–99)
NonHDL: 87.54
Total CHOL/HDL Ratio: 3
Triglycerides: 103 mg/dL (ref 0.0–149.0)
VLDL: 20.6 mg/dL (ref 0.0–40.0)

## 2016-04-22 MED ORDER — BUPROPION HCL ER (XL) 150 MG PO TB24: 150.0000 mg | ORAL_TABLET | Freq: Every day | ORAL | 3 refills | Status: DC

## 2016-04-22 NOTE — Progress Notes (Signed)
Pre visit review using our clinic review tool, if applicable. No additional management support is needed unless otherwise documented below in the visit note. 

## 2016-04-22 NOTE — Progress Notes (Signed)
[name] is a 61 y.o. female is here to discuss:  History of Present Illness:   [name] CMA acting as scribe for Dr. Juleen [name].  Chief Complaint  Patient presents with  . Weight Loss    Wants help with weight loss   . Referral    Wants referral to surgery for mass in arm    HPI:  1. Forearm mass, left. The patient's surgery appointment was canceled. She needs a new referral. See prior note for details.   2. Seasonal allergic rhinitis due to pollen. New. Runny nose, PND, cough. Previously took [facility] and an antihistamine with relief.    3. Menopausal vasomotor syndrome. Stable on hormone patch, Rx provided by Dr. Garwin [name].    4. Morbid obesity (HCC) (BMI 35 plus 2 comorbidities). Patient asks for help with weight loss. Wishes to lose 50 pounds or get to size 12. Now, at size 20. Hard to exercise due to deconditioning. Not watching diet.     5. Depression, recurrent ([place]). Not on medication. No SI/HI.    6. Hepatic steatosis, found on CT. Due for CMP, FLP.   Health Maintenance Due  Topic Date Due  . Hepatitis C Screening  1955-12-15  . HIV Screening  04/19/1972   PMHx, SurgHx, SocialHx, FamHx, Medications, and Allergies were reviewed in the Visit Navigator and updated as appropriate.   Patient Active Problem List   Diagnosis Date Noted  . History of hysterectomy 03/28/2016  . History of appendectomy 03/28/2016  . Umbilical hernia without obstruction or gangrene, fat containing 03/28/2016  . History of fusion of cervical spine 03/28/2016  . Hiatal hernia, small 03/28/2016  . Menopausal vasomotor syndrome 03/28/2016  . Forearm mass, left 03/28/2016  . Morbid obesity ([place]) (BMI 35 plus 2 comorbidities) 03/06/2016  . Chronic diastolic CHF (congestive heart failure) ([place]) 07/20/2015  . PAF (paroxysmal atrial fibrillation) ([place]) 04/24/2015  . History of lumbar spinal fusion 09/27/2014  . 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 Left adrenal mass  ([place]) 05/12/2013  . Back pain 08/03/2012  . Allergic rhinitis 04/03/2009  . Asthma, mild, intermittent 01/23/2009  . History of colonic polyps 04/21/2008  . Depression, recurrent ([place]) 03/03/2007  . Internal hemorrhoids 03/03/2007  . GERD 03/03/2007  . Diverticulosis, with history of diverticulitis of descending and sigmoid colon 03/03/2007  . Hepatic steatosis, found on CT 03/03/2007  . OSA (obstructive sleep apnea) 03/03/2007  . Essential hypertension 06/25/2006   Social History  Substance Use Topics  . Smoking status: Never Smoker  . Smokeless tobacco: Never Used  . Alcohol use No   Current Medications and Allergies:   .  apixaban (ELIQUIS) 5 MG TABS tablet, Take 1 tablet (5 mg total) by mouth 2 (two) times daily., Disp: 180 tablet, Rfl: 3 .  Calcium Carbonate-Vitamin D (CALCIUM 600+D) 600-400 MG-UNIT per tablet, Take 1 tablet by mouth daily.  , Disp: , Rfl:  .  hydrochlorothiazide (MICROZIDE) 12.5 MG capsule, TAKE 1 CAPSULE DAILY, Disp: 90 capsule, Rfl: 0 .  metoprolol tartrate (LOPRESSOR) 25 MG tablet, Take 1 tablet (25 mg total) by mouth 2 (two) times daily., Disp: 180 tablet, Rfl: 3 .  MINIVELLE 0.05 MG/24HR patch, Take 1 patch by mouth 2 (two) times a week. , Disp: , Rfl:  .  Multiple Vitamin (MULTIVITAMIN) tablet, Take 1 tablet by mouth daily., Disp: , Rfl:  .  valsartan (DIOVAN) 160 MG tablet, Take 1 tablet (160 mg total) by mouth daily., Disp: 30 tablet, Rfl: 11  Allergies  Allergen Reactions  . Tramadol     GI upset, "I felt like I was in a fog"   Review of Systems   Review of Systems  Constitutional: Negative for chills, fever and malaise/fatigue.  HENT: Negative for congestion, ear pain, sinus pain and sore throat.   Eyes: Negative for blurred vision and double vision.  Respiratory: Positive for cough.   Cardiovascular: Positive for leg swelling. Negative for chest pain and palpitations.  Gastrointestinal: Negative for abdominal pain, diarrhea, nausea and  vomiting.  Genitourinary: Negative for dysuria.  Musculoskeletal: Positive for back pain. Negative for joint pain and neck pain.  Neurological: Negative for dizziness and headaches.  Psychiatric/Behavioral: Positive for memory loss. Negative for depression and hallucinations.   Vitals:   Vitals:   04/22/16 1130  BP: 136/82  Pulse: 61  Temp: 97.9 F (36.6 C)  TempSrc: Oral  SpO2: 94%  Weight: 239 lb 3.2 oz (108.5 kg)  Height: 5\' 5"  (1.651 m)     Body mass index is 39.8 kg/m.   Physical Exam:    Physical Exam  Constitutional: She appears well-nourished.  HENT:  Head: Normocephalic and atraumatic.  Eyes: EOM are normal. Pupils are equal, round, and reactive to light.  Neck: Normal range of motion. Neck supple.  Cardiovascular: Normal rate.   Pulmonary/Chest: Effort normal.  Abdominal: Soft.  Skin: Skin is warm.  Quarter-sized, tender, palpable mass left anterior forearm without skin changes.  Psychiatric: She has a normal mood and affect. Her behavior is normal.  Nursing note and vitals reviewed.    Assessment and Plan:    [name] was seen today for weight loss and referral.  Diagnoses and all orders for this visit:  Forearm mass, left -     Ambulatory referral to General Surgery -     CBC with Differential/Platelet  Seasonal allergic rhinitis due to pollen Comments: OTC Zyrtec, Flonase.  Menopausal vasomotor syndrome Comments: Continue estrogen patch per GYN.  Morbid obesity (HCC) (BMI 35 plus 2 comorbidities) Comments: The patient is asked to make an attempt to improve diet and exercise patterns to aid in medical management of this problem. She will call her insurance company to ask about the cost of Saxenda.  Orders: -     TSH -     Hemoglobin A1c  Depression, recurrent (HCC) -     buPROPion (WELLBUTRIN XL) 150 MG 24 hr tablet; Take 1 tablet (150 mg total) by mouth daily. Start 150 mg ER PO qam.  Hepatic steatosis, found on CT -     Comprehensive  metabolic panel -     Lipid panel    . Reviewed expectations re: course of current medical issues. . Discussed self-management of symptoms. . Outlined signs and symptoms indicating need for more acute intervention. . Patient verbalized understanding and all questions were answered. . See orders for this visit as documented in the electronic medical record. . Patient received an After Visit Summary.   CMA served as Education administrator during this visit. History, Physical, and Plan performed by medical provider. Documentation and orders reviewed and attested to. Briscoe Deutscher, D.O.  Briscoe Deutscher, [place], [place] 04/22/2016  Follow-up: No Follow-up on file.

## 2016-04-28 ENCOUNTER — Telehealth: Payer: Self-pay | Admitting: Family Medicine

## 2016-04-28 NOTE — Telephone Encounter (Signed)
Can you please see what you find out about referral.

## 2016-04-28 NOTE — Telephone Encounter (Signed)
Patient would like to see about sending referral to other site. States she does not want to wait, still has not heard from them. Asked to be called back on work number.

## 2016-04-29 MED ORDER — METFORMIN HCL 500 MG PO TABS: 500.0000 mg | ORAL_TABLET | Freq: Two times a day (BID) | ORAL | 3 refills | Status: DC

## 2016-04-29 NOTE — Addendum Note (Signed)
Addended by: Briscoe Deutscher R on: 04/29/2016 03:25 PM   Modules accepted: Orders

## 2016-04-29 NOTE — Telephone Encounter (Signed)
Per [place] she spoke with patient and gave the date of the appointment to surgery. The appointment is May 7th.

## 2016-05-12 ENCOUNTER — Telehealth: Payer: Self-pay | Admitting: Family Medicine

## 2016-05-12 NOTE — Telephone Encounter (Signed)
Patient called stating the RX metFORMIN (GLUCOPHAGE) 500 MG tablet is making her sick. Stated she cut down to 1 a day and even with a meal it is making her nauseous. Patient stated she is watching what she eats and exercising and was wondering if she just needed to continue to do this or try a new RX. If prescribed a different medication, patient would like it to go to   [id number] - [place], [place]. [phone] (Phone) [phone] (Fax)   Please call patient and advise

## 2016-05-12 NOTE — Telephone Encounter (Signed)
Please advise 

## 2016-05-13 NOTE — Telephone Encounter (Signed)
Ask her to go ahead and call her insurance company to see how much Norman would cost and if she could afford it.

## 2016-05-14 NOTE — Telephone Encounter (Addendum)
Spoke with patient and she stated that she was at work and would call back.

## 2016-05-16 NOTE — Telephone Encounter (Signed)
LM for patient to return call.

## 2016-05-19 NOTE — Telephone Encounter (Signed)
Spoke with patient and she is going to call insurance company to find out her cost for the Saxsenda. She will call back when she speaks with insurance company.

## 2016-07-22 ENCOUNTER — Telehealth: Payer: Self-pay | Admitting: Family Medicine

## 2016-07-22 NOTE — Telephone Encounter (Signed)
Patient saw the news this morning and it showed that valsartan (DIOVAN) 160 MG tablet [[id number]]   is on recall. She is asking that you change the medication that she takes for blood pressure and [facility] [address] on file.   Verified cell #

## 2016-07-22 NOTE — Telephone Encounter (Signed)
Please advise 

## 2016-07-23 ENCOUNTER — Telehealth: Payer: Self-pay | Admitting: Family Medicine

## 2016-07-23 ENCOUNTER — Other Ambulatory Visit: Payer: Self-pay

## 2016-07-23 DIAGNOSIS — Z1159 Encounter for screening for other viral diseases: Secondary | ICD-10-CM

## 2016-07-23 MED ORDER — LOSARTAN POTASSIUM 100 MG PO TABS: 100.0000 mg | ORAL_TABLET | Freq: Every day | ORAL | 3 refills | Status: DC

## 2016-07-23 NOTE — Telephone Encounter (Signed)
Patient is on valsartan (DIOVAN) 160 MG tablet and needs to have an alternative since the FDA recalled it.

## 2016-07-23 NOTE — Telephone Encounter (Signed)
Okay to order?

## 2016-07-23 NOTE — Telephone Encounter (Signed)
Please advise 

## 2016-07-23 NOTE — Telephone Encounter (Signed)
Patient is requesting a hep c screen between 07/28/16-08/01/16. Please advise before I contact patient for scheduling for lab, there is not an order in the system.

## 2016-07-23 NOTE — Telephone Encounter (Signed)
Order placed for hep c lab.

## 2016-07-23 NOTE — Telephone Encounter (Signed)
Rx sent to patient's pharmacy.  Patient notified.  Also scheduled lab appointment for patient to have Hep C drawn.  See other message.

## 2016-07-23 NOTE — Telephone Encounter (Signed)
Okay to change to Losartan 100 mg.

## 2016-07-29 ENCOUNTER — Telehealth: Payer: Self-pay

## 2016-07-29 ENCOUNTER — Other Ambulatory Visit (INDEPENDENT_AMBULATORY_CARE_PROVIDER_SITE_OTHER)

## 2016-07-29 DIAGNOSIS — Z1159 Encounter for screening for other viral diseases: Secondary | ICD-10-CM | POA: Diagnosis not present

## 2016-07-29 DIAGNOSIS — R7303 Prediabetes: Secondary | ICD-10-CM

## 2016-07-29 LAB — HEMOGLOBIN A1C: Hgb A1c MFr Bld: 5.6 % (ref 4.6–6.5)

## 2016-07-29 NOTE — Addendum Note (Signed)
Addended by: Frutoso Chase A on: 07/29/2016 02:24 PM   Modules accepted: Orders

## 2016-07-29 NOTE — Telephone Encounter (Signed)
Please advise. Patient had A1C 04/22/16 and it was a 5.8.

## 2016-07-29 NOTE — Telephone Encounter (Signed)
During lab only visit this morning pt requested to have Hgb A1c checked. Please advise and place future order if appropriate.  Last A1c performed on 04/22/16. Pt requested re-check because she was told she was pre-diabetic. She has made some lifestyle changes so she would like to know if her number has gone down.

## 2016-07-30 LAB — HEPATITIS C ANTIBODY: HCV Ab: NEGATIVE

## 2016-08-04 ENCOUNTER — Telehealth: Payer: Self-pay | Admitting: Family Medicine

## 2016-08-04 NOTE — Telephone Encounter (Signed)
Patient calling to get lab results, asked for return call on home phone when possible.

## 2016-08-04 NOTE — Telephone Encounter (Signed)
Please advise 

## 2016-08-05 NOTE — Telephone Encounter (Signed)
Normal labs - A1c and Hep C. Look good!

## 2016-08-06 NOTE — Telephone Encounter (Signed)
LM for patient to return call.

## 2016-08-07 ENCOUNTER — Encounter: Payer: Self-pay | Admitting: Family Medicine

## 2016-08-07 ENCOUNTER — Ambulatory Visit (INDEPENDENT_AMBULATORY_CARE_PROVIDER_SITE_OTHER): Admitting: Family Medicine

## 2016-08-07 ENCOUNTER — Telehealth: Payer: Self-pay | Admitting: Cardiovascular Disease

## 2016-08-07 VITALS — BP 158/82 | HR 65 | Temp 97.8°F | Ht 65.0 in | Wt 233.2 lb

## 2016-08-07 DIAGNOSIS — I10 Essential (primary) hypertension: Secondary | ICD-10-CM

## 2016-08-07 DIAGNOSIS — R079 Chest pain, unspecified: Secondary | ICD-10-CM | POA: Diagnosis not present

## 2016-08-07 MED ORDER — INSULIN PEN NEEDLE 31G X 6 MM MISC: 1 refills | Status: DC

## 2016-08-07 MED ORDER — LIRAGLUTIDE 18 MG/3ML ~~LOC~~ SOPN: PEN_INJECTOR | SUBCUTANEOUS | 0 refills | Status: DC

## 2016-08-07 MED ORDER — LISINOPRIL 10 MG PO TABS: 10.0000 mg | ORAL_TABLET | Freq: Every day | ORAL | 3 refills | Status: DC

## 2016-08-07 NOTE — Telephone Encounter (Signed)
Advised patient of normal labs.  Patient states that since her BP medication was changed she has been having pain in her left arm.  States she feels "tightness around her heart" after taking the medication everyday.  No other symptoms.  She has been checking her blood pressure and states it has been normal.    Would like to know if she should come in to be seen or contact her cardiologist?      Please advise.

## 2016-08-07 NOTE — Patient Instructions (Signed)
You should take the VICTOZA pen once a day.  The side-effect is nausea, which goes away with time.  To avoid this side-effect, start with the lowest (0.6) setting.  After a few days, increase to 1.2.  If you still have little or no nausea, increase to the highest (1.8) setting, and continue with that setting.  [email or web address]

## 2016-08-07 NOTE — Telephone Encounter (Signed)
Mrs.Macht is calling because she is having some pain in her left arm . Its Radiating down her arm. Please call

## 2016-08-07 NOTE — Telephone Encounter (Signed)
Patient returning phone call, okay to leave a detailed message on phone. Calling about if she needed to get an EKG or not. Please advise.

## 2016-08-07 NOTE — Progress Notes (Signed)
[name] is a 61 y.o. female is here for follow up.  History of Present Illness:   [name] CMA acting as scribe for [name].  HPI: Patient comes in today for chest pain about an hour after taking the new medication Losartan. This happened on three occasions and lasted about an hours. She stopped taking the medication two days ago and has not had the chest pain since then. Blood pressures have remained stable. Cardiovascular ROS: negative for - dyspnea on exertion, edema, irregular heartbeat, loss of consciousness, orthopnea or shortness of breath.  Health Maintenance Due  Topic Date Due  . HIV Screening  04[birth year]19/1972  . INFLUENZA VACCINE  08/06/2016   Depression screen PHQ 2/9 03/27/2016  Decreased Interest 0  Down, Depressed, Hopeless 0  PHQ - 2 Score 0   PMHx, SurgHx, SocialHx, FamHx, Medications, and Allergies were reviewed in the Visit Navigator and updated as appropriate.   Patient Active Problem List   Diagnosis Date Noted  . History of hysterectomy 03/28/2016  . History of appendectomy 03/28/2016  . Umbilical hernia without obstruction or gangrene, fat containing 03/28/2016  . History of fusion of cervical spine 03/28/2016  . Hiatal hernia, small 03/28/2016  . Menopausal vasomotor syndrome 03/28/2016  . Forearm mass, left 03/28/2016  . [name] (BMI 35 plus 2 comorbidities) 03/06/2016  . Chronic diastolic CHF (congestive heart failure) ([place]) 07/20/2015  . PAF (paroxysmal atrial fibrillation) ([place]) 04/24/2015  . History of lumbar spinal fusion 09/27/2014  . 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 Left adrenal mass ([place]) 05/12/2013  . Back pain 08/03/2012  . Allergic rhinitis 04/03/2009  . Asthma, mild, intermittent 01/23/2009  . History of colonic polyps 04/21/2008  . Depression, recurrent ([place]) 03/03/2007  . Internal hemorrhoids 03/03/2007  . GERD 03/03/2007  . Diverticulosis, with history of diverticulitis of descending and  sigmoid colon 03/03/2007  . Hepatic steatosis, found on CT 03/03/2007  . OSA (obstructive sleep apnea) 03/03/2007  . Essential hypertension 06/25/2006   Social History  Substance Use Topics  . Smoking status: Never Smoker  . Smokeless tobacco: Never Used  . Alcohol use No   Current Medications and Allergies:   .  apixaban (ELIQUIS) 5 MG TABS tablet, Take 1 tablet (5 mg total) by mouth 2 (two) times daily., Disp: 180 tablet, Rfl: 3 .  buPROPion (WELLBUTRIN XL) 150 MG 24 hr tablet, Take 1 tablet (150 mg total) by mouth daily. Start 150 mg ER PO qam., Disp: 30 tablet, Rfl: 3 .  Calcium Carbonate-Vitamin D (CALCIUM 600+D) 600-400 MG-UNIT per tablet, Take 1 tablet by mouth daily.  , Disp: , Rfl:  .  hydrochlorothiazide (MICROZIDE) 12.5 MG capsule, TAKE 1 CAPSULE DAILY, Disp: 90 capsule, Rfl: 0 .  losartan (COZAAR) 100 MG tablet, Take 1 tablet (100 mg total) by mouth daily., Disp: 30 tablet, Rfl: 3 .  metFORMIN (GLUCOPHAGE) 500 MG tablet, Take 1 tablet (500 mg total) by mouth 2 (two) times daily with a meal., Disp: 180 tablet, Rfl: 3 .  metoprolol tartrate (LOPRESSOR) 25 MG tablet, Take 1 tablet (25 mg total) by mouth 2 (two) times daily., Disp: 180 tablet, Rfl: 3 .  MINIVELLE 0.05 MG/24HR patch, Take 1 patch by mouth 2 (two) times a week. , Disp: , Rfl:  .  Multiple Vitamin (MULTIVITAMIN) tablet, Take 1 tablet by mouth daily., Disp: , Rfl:   Allergies  Allergen Reactions  . Tramadol     GI upset, "I felt like I was  in a fog"   Review of Systems   Pertinent items are noted in the HPI. Otherwise, ROS is negative.  Vitals:   Vitals:   08/07/16 1351  BP: (!) 158/82  Pulse: 65  Temp: 97.8 F (36.6 C)  TempSrc: Oral  SpO2: 96%  Weight: 233 lb 3.2 oz (105.8 kg)  Height: 5\' 5"  (1.651 m)     Body mass index is 38.81 kg/m.  Physical Exam:   Physical Exam  Constitutional: She is oriented to person, place, and time. She appears well-developed and well-nourished. No distress.  HENT:   Head: Normocephalic and atraumatic.  Eyes: Pupils are equal, round, and reactive to light. EOM are normal.  Neck: Normal range of motion. Neck supple.  Cardiovascular: Normal rate, regular rhythm, normal heart sounds and intact distal pulses.   Pulmonary/Chest: Effort normal and breath sounds normal.  Abdominal: Soft. Bowel sounds are normal.  Neurological: She is alert and oriented to person, place, and time.  Skin: Skin is warm. Capillary refill takes less than 2 seconds.  Psychiatric: She has a normal mood and affect. Her behavior is normal.  Nursing note and vitals reviewed.   EKG: normal sinus rhythm.  Assessment and Plan:   [place] was seen today for chest pain.  Diagnoses and all orders for this visit:  Chest pain, atypical, resolved Comments: Resolved. EKG reassuring. Patient seems to be intolerant to Losartan. Will change medication.  Orders: -     EKG 12-Lead  [name] (BMI 35 plus 2 comorbidities) Comments: The patient is asked to make an attempt to improve diet and exercise patterns to aid in medical management of this problem. After discussion, patient would like to start below medication. Expectations, risks, and potential side effects reviewed. Recheck in one week. Orders: -     liraglutide 18 MG/3ML SOPN; Start with the lowest (0.6) setting.  After a few days, increase to 1.2.  If you still have little or no nausea, increase to the highest (1.8) setting, and continue with that setting. -     Insulin Pen Needle 31G X 6 MM MISC; Use one daily with pen.  Essential hypertension Comments: After discussion, patient would like to start below medication. Expectations, risks, and potential side effects reviewed. Will recheck in 1-2 weeks.  Orders: -     lisinopril (PRINIVIL,ZESTRIL) 10 MG tablet; Take 1 tablet (10 mg total) by mouth daily. THE PATIENT WAS INSTRUCTED TO INCREASE TO 2 PO DAILY AFTER ONE WEEK IF SBP REMAINS > 150.  [name] Reviewed expectations re:  course of current medical issues. . Discussed self-management of symptoms. . Outlined signs and symptoms indicating need for more acute intervention. . Patient verbalized understanding and all questions were answered. [name] Health Maintenance issues including appropriate healthy diet, exercise, and smoking avoidance were discussed with patient. . See orders for this visit as documented in the electronic medical record. . Patient received an After Visit Summary.  CMA served as Education administrator during this visit. History, Physical, and Plan performed by medical provider. The above documentation has been reviewed and is accurate and complete. Briscoe Deutscher, D.O.  Briscoe Deutscher, DO [place], [place] 08/09/2016  No future appointments.

## 2016-08-07 NOTE — Telephone Encounter (Signed)
Patient calling and states that she has been having intermittent pain in her left arm and back spasms. She denies having any chest pain, SOB, lightheadedness, dizziness, or any other symptoms. Patient states that this started after her valsartan was switched to losartan 100 mg QD about a week ago. Patient states that she has been in contact with her PCP and is going to be seen there today and that they are going to do an EKG on her. Patient requesting an appointment in our office as well. Appointment made with Richardson Dopp, PA on Monday at 2:45 PM. Patient advised to keep appointment with PCP today and if her symptoms worsen before Monday to let us know. Patient verbalized understanding and was thankful for the call.

## 2016-08-07 NOTE — Telephone Encounter (Signed)
Tried to call patient.  Patient answered and stated she was on the phone with her cardiologist.  Stated she would call our office back.

## 2016-08-07 NOTE — Telephone Encounter (Signed)
Patient calling back after speaking to cardiologist. Please call her back.  Thank you,  -LL

## 2016-08-07 NOTE — Telephone Encounter (Signed)
Per [name], patient should definitely be seen to at least have an EKG performed.  Would recommend patient see her cardiologist for this.    Advised patient of above advice.  She will contact her cardiologists office.  If she is unable to get in to be seen, she will call our office back to schedule appointment.

## 2016-08-08 NOTE — Telephone Encounter (Signed)
Patient evaluated in office 08/07/16, day of call.

## 2016-08-11 ENCOUNTER — Ambulatory Visit: Admitting: Physician Assistant

## 2016-08-18 ENCOUNTER — Encounter: Payer: Self-pay | Admitting: Cardiovascular Disease

## 2016-08-19 ENCOUNTER — Other Ambulatory Visit: Payer: Self-pay | Admitting: Gastroenterology

## 2016-08-19 ENCOUNTER — Telehealth: Payer: Self-pay | Admitting: Cardiovascular Disease

## 2016-08-19 ENCOUNTER — Encounter: Payer: Self-pay | Admitting: Family Medicine

## 2016-08-19 DIAGNOSIS — R1011 Right upper quadrant pain: Secondary | ICD-10-CM

## 2016-08-19 DIAGNOSIS — R112 Nausea with vomiting, unspecified: Secondary | ICD-10-CM

## 2016-08-19 MED ORDER — LOSARTAN POTASSIUM 50 MG PO TABS: 50.0000 mg | ORAL_TABLET | Freq: Every day | ORAL | 3 refills | Status: DC

## 2016-08-19 MED ORDER — OLMESARTAN MEDOXOMIL 20 MG PO TABS: 20.0000 mg | ORAL_TABLET | Freq: Every day | ORAL | 11 refills | Status: DC

## 2016-08-19 NOTE — Progress Notes (Signed)
Winta Barcelo MD 

## 2016-08-19 NOTE — Telephone Encounter (Signed)
Spoke with patient who states she has not been feeling well since switching from valsartan to lisinopril. She complains of headaches and stomach discomfort. She would like Dr. Acie Fredrickson to recommend a medication to take in the place of lisinopril. I advised her that per Dr. Acie Fredrickson who is in the office today, stop lisinopril and wait 2 days before starting losartan 50 mg. The patient's chart indicates an allergy to losartan but the patient cannot remember taking it. Per further discussion with the patient and Dr. Acie Fredrickson, we decided to switch her to olmesartan 20 mg instead. I advised her to start the new medication on Thursday and to monitor her BP.  She has a follow-up appointment in October and I advised her to call back if she has questions or concerns prior to her appointment. She verbalized understanding and agreement with plan of care. She thanked me for the call.

## 2016-08-19 NOTE — Telephone Encounter (Signed)
New message   Would like Dr Acie Fredrickson to prescribe her bp medication instead of PCP  Pt c/o medication issue:  1. Name of Medication:  Was on Valsartan and her pcp put her on Lisinopril   2. How are you currently taking this medication (dosage and times per day)?  2x a day  3. Are you having a reaction (difficulty breathing--STAT)?  Yes  4. What is your medication issue?  Headache and stomach pain, and she is having palpitations since starting this medication

## 2016-08-20 ENCOUNTER — Ambulatory Visit (HOSPITAL_COMMUNITY)
Admission: RE | Admit: 2016-08-20 | Discharge: 2016-08-20 | Disposition: A | Discharge status: Home / Self Care | Source: Ambulatory Visit | Attending: Gastroenterology | Admitting: Gastroenterology

## 2016-08-20 DIAGNOSIS — R109 Unspecified abdominal pain: Secondary | ICD-10-CM | POA: Diagnosis not present

## 2016-08-20 DIAGNOSIS — R110 Nausea: Secondary | ICD-10-CM | POA: Insufficient documentation

## 2016-08-20 DIAGNOSIS — R932 Abnormal findings on diagnostic imaging of liver and biliary tract: Secondary | ICD-10-CM | POA: Insufficient documentation

## 2016-08-20 DIAGNOSIS — R1011 Right upper quadrant pain: Secondary | ICD-10-CM

## 2016-08-20 DIAGNOSIS — R112 Nausea with vomiting, unspecified: Secondary | ICD-10-CM

## 2016-08-22 ENCOUNTER — Encounter (HOSPITAL_COMMUNITY)
Admission: RE | Admit: 2016-08-22 | Discharge: 2016-08-22 | Disposition: A | Discharge status: Home / Self Care | Source: Ambulatory Visit | Attending: Gastroenterology | Admitting: Gastroenterology

## 2016-08-22 DIAGNOSIS — R112 Nausea with vomiting, unspecified: Secondary | ICD-10-CM | POA: Diagnosis present

## 2016-08-22 DIAGNOSIS — R1011 Right upper quadrant pain: Secondary | ICD-10-CM | POA: Insufficient documentation

## 2016-08-22 MED ORDER — TECHNETIUM TC 99M MEBROFENIN IV KIT: 5.0000 | PACK | Freq: Once | INTRAVENOUS | Status: DC | PRN

## 2016-09-11 ENCOUNTER — Other Ambulatory Visit: Payer: Self-pay | Admitting: Surgery

## 2016-09-15 ENCOUNTER — Encounter: Payer: Self-pay | Admitting: Cardiovascular Disease

## 2016-09-15 ENCOUNTER — Telehealth: Payer: Self-pay | Admitting: Cardiovascular Disease

## 2016-09-15 ENCOUNTER — Other Ambulatory Visit: Payer: Self-pay | Admitting: Cardiovascular Disease

## 2016-09-15 ENCOUNTER — Ambulatory Visit (INDEPENDENT_AMBULATORY_CARE_PROVIDER_SITE_OTHER): Admitting: Cardiovascular Disease

## 2016-09-15 VITALS — BP 160/74 | HR 54 | Ht 65.0 in | Wt 228.8 lb

## 2016-09-15 DIAGNOSIS — I480 Paroxysmal atrial fibrillation: Secondary | ICD-10-CM

## 2016-09-15 DIAGNOSIS — I10 Essential (primary) hypertension: Secondary | ICD-10-CM | POA: Diagnosis not present

## 2016-09-15 MED ORDER — APIXABAN 5 MG PO TABS: 5.0000 mg | ORAL_TABLET | Freq: Two times a day (BID) | ORAL | 3 refills | Status: DC

## 2016-09-15 MED ORDER — PROPRANOLOL HCL 10 MG PO TABS: 10.0000 mg | ORAL_TABLET | Freq: Four times a day (QID) | ORAL | 6 refills | Status: DC | PRN

## 2016-09-15 MED ORDER — OLMESARTAN MEDOXOMIL 20 MG PO TABS: 20.0000 mg | ORAL_TABLET | Freq: Every day | ORAL | 3 refills | Status: DC

## 2016-09-15 MED ORDER — METOPROLOL TARTRATE 25 MG PO TABS: 25.0000 mg | ORAL_TABLET | Freq: Two times a day (BID) | ORAL | 3 refills | Status: DC

## 2016-09-15 NOTE — Telephone Encounter (Signed)
Received call from patient who states she needs #90 supply of cardiac medications sent to [facility]. I advised that I will order these and to call back with questions or concerns. She thanked me for taking her call.

## 2016-09-15 NOTE — Patient Instructions (Signed)
Medication Instructions:  Your physician recommends that you continue on your current medications as directed. Please refer to the Current Medication list given to you today.   Labwork: None Ordered   Testing/Procedures: None Ordered   Follow-Up: Your physician wants you to follow-up in: 6 months with Dr. Nahser.  You will receive a reminder letter in the mail two months in advance. If you don't receive a letter, please call our office to schedule the follow-up appointment.   If you need a refill on your cardiac medications before your next appointment, please call your pharmacy.   Thank you for choosing [facility]! Cornie Herrington, RN [phone]    

## 2016-09-15 NOTE — Telephone Encounter (Signed)
New message     *STAT* If patient is at the pharmacy, call can be transferred to refill team.   1. Which medications need to be refilled? (please list name of each medication and dose if known) Olmesartan 20 mg, Eliquis 5 mg, Metoprolol 25 mg, propanolol 10 mg   2. Which pharmacy/location (including street and city if local pharmacy) is medication to be sent to? [place]  3. Do they need a 30 day or 90 day supply? 90 day

## 2016-09-15 NOTE — Progress Notes (Signed)
Cardiology Office Note   Date:  09/15/2016   ID:  [name], [name] 09-13-1955, MRN [id number]  PCP:  Briscoe Deutscher, DO  Cardiologist:   Mertie Moores, MD   Chief Complaint  Patient presents with  . Atrial Fibrillation    paroxysmal atrial fib   Problem List 1. Atrial fib - This patients CHA2DS2-VASc Score and unadjusted Ischemic Stroke Rate (% per year) is equal to 2.2 % stroke rate/year from a score of 2  Above score calculated as 1 point each if present [CHF, HTN, DM, Vascular=MI/PAD/Aortic Plaque, Age if 65-74, or Female] Above score calculated as 2 points each if present [Age > 75, or Stroke/TIA/TE]  2. Essential HTN 3. Grade 2 diastolic dysfunction  - normal LV function 4. Obstructive sleep apnea -   [name] is a 61 y.o. female who presents for atrial fib, She was seen in the ER with PAF Has had a dull head ache.   Echo shows normal LV systolic function Myoview shows no ischemia   July 20, 2015:  [name] is seen today for follow up visit for her paroxysmal atrial fib.  Has had 1 episode of palpitations - likely was PAF - related to stress.  Is watching the salt in her diet .  The Xarelto seems to be upsetting her stomach  Held it for several days and her stomach started bothering her again  March 06, 2016:  Doing well  Having some back pain  Had an episode of atrial fib 3 nights ago.   Work her up . Has OAS - uses CPAP but she had pulled it off. She put the CPAP mask back on   09/15/2016: [name] is seen back for follow-up evaluation. Wt is 228 ( down 9 lbs from last visit )  Has been having more palpitations.  Took some extra metoprolol which seems    Has not been using her CPAP because her mask has lots of condensation and drains back into there mask .  Has been tried on Lisinopril ( caused CP and cough) and was on Valsartan ( was recalled )  Doing well on the [facility]   Past Medical History:  Diagnosis Date  . Allergic rhinitis  04/03/2009  . Anemia   . Asthma, mild, intermittent 01/23/2009   Qualifier: Diagnosis of  By: Jerold Coombe    . Back pain 08/03/2012  . 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 Chronic diastolic CHF (congestive heart failure) ([name]) 07/20/2015  . Depression, recurrent ([place]) 03/03/2007   Qualifier: Diagnosis of  By: Nils Pyle CMA (AAMA), Mearl Latin    . Difficult intubation    04/09/04 with difficult intubation. unabale to pass ETT with Sabra Heck 2; unable to visualize cord with MAC 3; intubating LMA used  . Diverticulosis, with history of diverticulitis of descending and sigmoid colon 03/03/2007  . Essential hypertension 06/25/2006   Qualifier: Diagnosis of  By: Tiney Rouge CMA, Ellison Hughs    . Gastritis   . GERD 03/03/2007   Qualifier: Diagnosis of  By: Nils Pyle CMA (AAMA), Mearl Latin   Qualifier: Diagnosis of  By: Shane Crutch, [name]   . Hepatic steatosis, found on CT 03/03/2007      . Hiatal hernia, small 03/28/2016   EGD by [facility] 12/09/12.   [name] History of colonic polyps 04/21/2008   Last Colonoscopy was 12/09/12, Dr. Fuller Plan. No polyp at that time. Repeat in 5 years.   . Internal hemorrhoids 03/03/2007   Last Colonoscopy was 12/09/12, Dr. Fuller Plan. Repeat  in 5 years.   . Left adrenal mass ([employer]) 05/12/2013  . Menopausal vasomotor syndrome 03/28/2016  . Morbid obesity ([place]) (BMI 35 plus 2 comorbidities) 03/06/2016   Comorbid conditions include HTN, HLD, hepatic steatosis, OA, GERD, OSA.  . OSA (obstructive sleep apnea) 03/03/2007   [name].   [name] PAF (paroxysmal atrial fibrillation) ([place]) 04/24/2015  . PONV (postoperative nausea and vomiting)   . Umbilical hernia without obstruction or gangrene, fat containing 03/28/2016   Found on CT, 2017. No issues.    Past Surgical History:  Procedure Laterality Date  . APPENDECTOMY    . BREAST REDUCTION SURGERY    . BUNIONECTOMY    . CERVICAL FUSION    . MAXIMUM ACCESS (MAS)POSTERIOR LUMBAR INTERBODY FUSION (PLIF) 2 LEVEL N/A 09/27/2014   Procedure: Maximum Access  Surgery Posterior Lumbar Interbody Fusion Lumbar three-four, Lumbar four-five;  Surgeon: Eustace Moore, MD;  Location: [facility];  Service: Neurosurgery;  Laterality: N/A;  . PARTIAL HYSTERECTOMY    . SPINE SURGERY    . TOTAL ABDOMINAL HYSTERECTOMY W/ BILATERAL SALPINGOOPHORECTOMY       Current Outpatient Prescriptions  Medication Sig Dispense Refill  . apixaban (ELIQUIS) 5 MG TABS tablet Take 1 tablet (5 mg total) by mouth 2 (two) times daily. 180 tablet 3  . Calcium Carbonate-Vitamin D (CALCIUM 600+D) 600-400 MG-UNIT per tablet Take 1 tablet by mouth daily.      . hydrochlorothiazide (MICROZIDE) 12.5 MG capsule Take 12.5 mg by mouth daily.    [name] MINIVELLE 0.05 MG/24HR patch Place 1 patch onto the skin 2 (two) times a week.     . Multiple Vitamin (MULTIVITAMIN) tablet Take 1 tablet by mouth daily.    [name] olmesartan (BENICAR) 20 MG tablet Take 1 tablet (20 mg total) by mouth daily. 30 tablet 11  . propranolol (INDERAL) 10 MG tablet Take 10 mg by mouth 4 (four) times daily as needed (palpitations).     No current facility-administered medications for this visit.     Allergies:   Losartan and Tramadol    Social History:  The patient  reports that she has never smoked. She has never used smokeless tobacco. She reports that she does not drink alcohol or use drugs.   Family History:  The patient's family history includes Coronary artery disease in her sister; Diabetes in her mother; Hypertension in her mother and sister.    ROS:  Please see the history of present illness.    Review of Systems: Constitutional:  denies fever, chills, diaphoresis, appetite change and fatigue.  HEENT: denies photophobia, eye pain, redness, hearing loss, ear pain, congestion, sore throat, rhinorrhea, sneezing, neck pain, neck stiffness and tinnitus.  Respiratory: denies SOB, DOE, cough, chest tightness, and wheezing.  Cardiovascular: denies chest pain, palpitations and leg swelling.  Gastrointestinal:  denies nausea, vomiting, abdominal pain, diarrhea, constipation, blood in stool.  Genitourinary: denies dysuria, urgency, frequency, hematuria, flank pain and difficulty urinating.  Musculoskeletal: denies  myalgias, back pain, joint swelling, arthralgias and gait problem.   Skin: denies pallor, rash and wound.  Neurological: denies dizziness, seizures, syncope, weakness, light-headedness, numbness and headaches.   Hematological: denies adenopathy, easy bruising, personal or family bleeding history.  Psychiatric/ Behavioral: denies suicidal ideation, mood changes, confusion, nervousness, sleep disturbance and agitation.       All other systems are reviewed and negative.    PHYSICAL EXAM: VS:  BP (!) 160/74   Pulse (!) 54   Ht 5' 5"  (1.651 m)   Wt  228 lb 12.8 oz (103.8 kg)   BMI 38.07 kg/m  , BMI Body mass index is 38.07 kg/m. GEN: Well nourished, well developed, in no acute distress  HEENT: normal  Neck: no JVD, carotid bruits, or masses Cardiac: RRR; no murmurs, rubs, or gallops,no edema  Respiratory:  clear to auscultation bilaterally, normal work of breathing GI: soft, nontender, nondistended, + BS MS: no deformity or atrophy  Skin: warm and dry, no rash Neuro:  Strength and sensation are intact Psych: normal   EKG:  EKG is ordered today.  Sept. 10. 2018. Sinus brady at 54.   Mod. Voltage for LVH with associated ST abn.     Recent Labs: 04/22/2016: ALT 22; BUN 12; Creatinine, Ser 0.71; Hemoglobin 13.9; Platelets 359.0; Potassium 3.7; Sodium 138; TSH 1.20    Lipid Panel    Component Value Date/Time   CHOL 129 04/22/2016 1234   CHOL 126 03/06/2016 0819   TRIG 103.0 04/22/2016 1234   HDL 41.80 04/22/2016 1234   HDL 40 03/06/2016 0819   CHOLHDL 3 04/22/2016 1234   VLDL 20.6 04/22/2016 1234   LDLCALC 67 04/22/2016 1234   LDLCALC 62 03/06/2016 0819      Wt Readings from Last 3 Encounters:  09/15/16 228 lb 12.8 oz (103.8 kg)  08/07/16 233 lb 3.2 oz (105.8 kg)    04/22/16 239 lb 3.2 oz (108.5 kg)      Other studies Reviewed: Additional studies/ records that were reviewed today include: . Review of the above records demonstrates:    ASSESSMENT AND PLAN:  1.  Paroxysmal atrial fibrillation:    Ana continues to have episodes of paroxysmal atrial fibrillation. She remains in sinus rhythm today. I have encouraged her to continue with a good weight loss program as this will help reduce the amount of atrial fibrillation she has. I've also encouraged her to get her CPAP mask fixed so that she can wear her CPAP as this will also help reduce the amount of atrial fibrillation. Continue Eliquis  She needs to have gall bladder surgery .   She is at low risk for her surgery and may hold the Eliquis for 2 days prior to her procedure   I've encouraged her to work on an exercise program. I'll see her in 6 month.  2 headache:  Still has some headaches - not as bad.   Current medicines are reviewed at length with the patient today.  The patient does not have concerns regarding medicines.  Labs/ tests ordered today include:  No orders of the defined types were placed in this encounter.   Disposition:   FU with me in 6 months      Mertie Moores, MD  09/15/2016 1:49 PM    [facility] [place], [place], [place]  [address] Phone: [phone]; Fax: [phone]

## 2016-09-15 NOTE — Telephone Encounter (Signed)
Spoke with patient who states she has been having more episodes of a fib. She states she has gallbladder surgery coming up and she cannot tell when her discomfort is coming from gallbladder or from a fib. She denies tachycardia; states she has been taking propranolol prn. I offered her an appointment today with Dr. Acie Fredrickson. She verbalized agreement with plan to come to the office today. She thanked me for the call.

## 2016-09-15 NOTE — Telephone Encounter (Signed)
New message    Pt is calling asking for a call back. She states that she is in afib.

## 2016-09-18 ENCOUNTER — Telehealth: Payer: Self-pay | Admitting: Family Medicine

## 2016-09-18 NOTE — Telephone Encounter (Signed)
Patient states that cardiologist told her that she has incorrect dx of diabetes in her record and she wants that removed. She also states that she is not taking microzide or  Lopressor. Pt would like someone to call her back.

## 2016-09-19 NOTE — Telephone Encounter (Signed)
LM to return call.  I do not see a diagnosis of diabetes in this patient's chart.

## 2016-09-22 ENCOUNTER — Other Ambulatory Visit: Payer: Self-pay

## 2016-09-22 MED ORDER — HYDROCHLOROTHIAZIDE 12.5 MG PO CAPS: 12.5000 mg | ORAL_CAPSULE | Freq: Every day | ORAL | 0 refills | Status: DC

## 2016-09-22 MED ORDER — METOPROLOL TARTRATE 25 MG PO TABS: 25.0000 mg | ORAL_TABLET | Freq: Two times a day (BID) | ORAL | 3 refills | Status: DC

## 2016-09-22 MED ORDER — OLMESARTAN MEDOXOMIL-HCTZ 20-12.5 MG PO TABS: 1.0000 | ORAL_TABLET | Freq: Every day | ORAL | 0 refills | Status: DC

## 2016-09-22 NOTE — Telephone Encounter (Signed)
Spoke with patient and her medication list is up to date. Problem list did not need to be changed.

## 2016-09-26 NOTE — Pre-Procedure Instructions (Signed)
[name]  09/26/2016      [facility] [place] - [place] [address] [place] ([place]) [place] [address] Phone: [phone] Fax: [phone]  [id number] [place], [place]. [place] [address] Phone: [phone] Fax: [phone]  [facility] [place] [place] [facility] [place] [place] [address] Phone: [phone] Fax: [phone]  [facility] [place], [place] [address] [place] [address] Phone: [phone] Fax: [phone]    Your procedure is scheduled on Sept 26  Report to [facility] at [phone]  Call this number if you have problems the morning of surgery:  [phone]   Remember:  Do not eat food or drink liquids after midnight.  Take these medicines the morning of surgery with A SIP OF WATER Metoprolol tartrate (Lopressor), Propranolol (Inderal) if needed  Stop eliquis as directed by your Dr.  Stop taking aspirin. BC's, Goody's, Herbal medications, Ibuprofen, Advil, Motrin, Aleve, Vitamins   Do not wear jewelry, make-up or nail polish.  Do not wear lotions, powders, or perfumes, or deoderant.  Do not shave 48 hours prior to surgery.  Men may shave face and neck.  Do not bring valuables to the hospital.  [facility] is not responsible for any belongings or valuables.  Contacts, dentures or bridgework may not be worn into surgery.  Leave your suitcase in the car.  After surgery it may be brought to your room.  For patients admitted to the hospital, discharge time will be determined by your treatment team.  Patients discharged the day of surgery will not be allowed to drive home. Special instructions:  [place] - Preparing for Surgery  Before surgery, you can play an important role.  Because skin is not sterile, your skin  needs to be as free of germs as possible.  You can reduce the number of germs on you skin by washing with CHG (chlorahexidine gluconate) soap before surgery.  CHG is an antiseptic cleaner which kills germs and bonds with the skin to continue killing germs even after washing.  Please DO NOT use if you have an allergy to CHG or antibacterial soaps.  If your skin becomes reddened/irritated stop using the CHG and inform your nurse when you arrive at Short Stay.  Do not shave (including legs and underarms) for at least 48 hours prior to the first CHG shower.  You may shave your face.  Please follow these instructions carefully:   1.  Shower with CHG Soap the night before surgery and the morning of Surgery.  2.  If you choose to wash your hair, wash your hair first as usual with your normal shampoo.  3.  After you shampoo, rinse your hair and body thoroughly to remove the Shampoo.  4.  Use CHG as you would any other liquid soap.  You can apply chg directly  to the skin and wash gently with scrungie or a clean washcloth.  5.  Apply the CHG Soap to your body ONLY FROM THE NECK DOWN.   Do not use on open wounds or open sores.  Avoid contact with your eyes,       ears, mouth and genitals (private parts).  Wash genitals (private parts)   with your normal soap.  6.  Wash thoroughly, paying special attention  to the area where your surgery will be performed.  7.  Thoroughly rinse your body with warm water from the neck down.  8.  DO NOT shower/wash with your normal soap after using and rinsing off the CHG Soap.  9.  Pat yourself dry with a clean towel.            10.  Wear clean pajamas.            11.  Place clean sheets on your bed the night of your first shower and do not  sleep with pets.  Day of Surgery  Do not apply any lotions/deoderants the morning of surgery.  Please wear clean clothes to the hospital/surgery center.     Please read over the following fact sheets that you were given. Pain  Booklet, Coughing and Deep Breathing and Surgical Site Infection Prevention

## 2016-09-29 ENCOUNTER — Encounter (HOSPITAL_COMMUNITY): Payer: Self-pay

## 2016-09-29 ENCOUNTER — Encounter (HOSPITAL_COMMUNITY)
Admission: RE | Admit: 2016-09-29 | Discharge: 2016-09-29 | Disposition: A | Discharge status: Home / Self Care | Source: Ambulatory Visit | Attending: Surgery | Admitting: Surgery

## 2016-09-29 DIAGNOSIS — G4733 Obstructive sleep apnea (adult) (pediatric): Secondary | ICD-10-CM | POA: Diagnosis not present

## 2016-09-29 DIAGNOSIS — K219 Gastro-esophageal reflux disease without esophagitis: Secondary | ICD-10-CM | POA: Diagnosis not present

## 2016-09-29 DIAGNOSIS — F329 Major depressive disorder, single episode, unspecified: Secondary | ICD-10-CM | POA: Diagnosis not present

## 2016-09-29 DIAGNOSIS — K828 Other specified diseases of gallbladder: Secondary | ICD-10-CM | POA: Diagnosis present

## 2016-09-29 DIAGNOSIS — I110 Hypertensive heart disease with heart failure: Secondary | ICD-10-CM | POA: Diagnosis not present

## 2016-09-29 DIAGNOSIS — Z01818 Encounter for other preprocedural examination: Secondary | ICD-10-CM | POA: Insufficient documentation

## 2016-09-29 DIAGNOSIS — K811 Chronic cholecystitis: Secondary | ICD-10-CM | POA: Diagnosis not present

## 2016-09-29 DIAGNOSIS — I5032 Chronic diastolic (congestive) heart failure: Secondary | ICD-10-CM | POA: Diagnosis not present

## 2016-09-29 DIAGNOSIS — Z79899 Other long term (current) drug therapy: Secondary | ICD-10-CM | POA: Diagnosis not present

## 2016-09-29 DIAGNOSIS — Z7902 Long term (current) use of antithrombotics/antiplatelets: Secondary | ICD-10-CM | POA: Diagnosis not present

## 2016-09-29 DIAGNOSIS — I480 Paroxysmal atrial fibrillation: Secondary | ICD-10-CM | POA: Diagnosis not present

## 2016-09-29 HISTORY — DX: Cardiac arrhythmia, unspecified: I49.9

## 2016-09-29 HISTORY — DX: Unspecified osteoarthritis, unspecified site: M19.90

## 2016-09-29 LAB — CBC
HCT: 44 % (ref 36.0–46.0)
Hemoglobin: 14.5 g/dL (ref 12.0–15.0)
MCH: 32.2 pg (ref 26.0–34.0)
MCHC: 33 g/dL (ref 30.0–36.0)
MCV: 97.8 fL (ref 78.0–100.0)
Platelets: 300 10*3/uL (ref 150–400)
RBC: 4.5 MIL/uL (ref 3.87–5.11)
RDW: 13.1 % (ref 11.5–15.5)
WBC: 5.5 10*3/uL (ref 4.0–10.5)

## 2016-09-29 LAB — BASIC METABOLIC PANEL
Anion gap: 8 (ref 5–15)
BUN: 8 mg/dL (ref 6–20)
CO2: 26 mmol/L (ref 22–32)
Calcium: 9.2 mg/dL (ref 8.9–10.3)
Chloride: 105 mmol/L (ref 101–111)
Creatinine, Ser: 0.91 mg/dL (ref 0.44–1.00)
GFR calc Af Amer: >60 mL/min (ref >60)
GFR calc non Af Amer: >60 mL/min (ref >60)
Glucose, Bld: 97 mg/dL (ref 65–99)
Potassium: 3.9 mmol/L (ref 3.5–5.1)
Sodium: 139 mmol/L (ref 135–145)

## 2016-09-29 LAB — PROTIME-INR
INR: 1.13
Prothrombin Time: 14.4 seconds (ref 11.4–15.2)

## 2016-09-29 NOTE — Pre-Procedure Instructions (Signed)
[name]  09/29/2016      [facility] [place] - [place] [address] [place] ([place]) [place] [address] Phone: [phone] Fax: [phone]  [id number] [place], [place]. [place] [address] Phone: [phone] Fax: [phone]  [facility] [place], [place] to [facility] [place] [address] Phone: [phone] Fax: [phone]  [facility] [place], [place] [address] [place] [address] Phone: [phone] Fax: [phone]    Your procedure is scheduled on Sept 26  Report to [facility] at [phone]  Call this number if you have problems the morning of surgery:  [phone]   Remember:  Do not eat food or drink liquids after midnight.  Take these medicines the morning of surgery with A SIP OF WATER Metoprolol tartrate (Lopressor), Propranolol (Inderal) if needed  Stop eliquis as directed by your Dr.   Stop taking aspirin. BC's, Goody's, Herbal medications, Ibuprofen, Advil, Motrin, Aleve, Vitamins starting today 09/29/16   Do not wear jewelry, make-up or nail polish.  Do not wear lotions, powders, or perfumes, or deoderant.  Do not shave 48 hours prior to surgery.  Men may shave face and neck.  Do not bring valuables to the hospital.  [facility] is not responsible for any belongings or valuables.  Contacts, dentures or bridgework may not be worn into surgery.  Leave your suitcase in the car.  After surgery it may be brought to your room.  For patients admitted to the hospital, discharge time will be determined by your treatment team.  Patients discharged the day of surgery will not be allowed to drive home. Special instructions:  [place] - Preparing for Surgery  Before surgery, you can play an important role.  Because skin is  not sterile, your skin needs to be as free of germs as possible.  You can reduce the number of germs on you skin by washing with CHG (chlorahexidine gluconate) soap before surgery.  CHG is an antiseptic cleaner which kills germs and bonds with the skin to continue killing germs even after washing.  Please DO NOT use if you have an allergy to CHG or antibacterial soaps.  If your skin becomes reddened/irritated stop using the CHG and inform your nurse when you arrive at Short Stay.  Do not shave (including legs and underarms) for at least 48 hours prior to the first CHG shower.  You may shave your face.  Please follow these instructions carefully:   1.  Shower with CHG Soap the night before surgery and the morning of Surgery.  2.  If you choose to wash your hair, wash your hair first as usual with your normal shampoo.  3.  After you shampoo, rinse your hair and body thoroughly to remove the Shampoo.  4.  Use CHG as you would any other liquid soap.  You can apply chg directly  to the skin and wash gently with scrungie or a clean washcloth.  5.  Apply the CHG Soap to your body ONLY FROM THE NECK DOWN.   Do not use on open wounds or open sores.  Avoid contact with your eyes,       ears, mouth and genitals (private parts).  Wash genitals (private parts)   with your normal soap.  6.  Wash  thoroughly, paying special attention to the area where your surgery will be performed.  7.  Thoroughly rinse your body with warm water from the neck down.  8.  DO NOT shower/wash with your normal soap after using and rinsing off the CHG Soap.  9.  Pat yourself dry with a clean towel.            10.  Wear clean pajamas.            11.  Place clean sheets on your bed the night of your first shower and do not  sleep with pets.  Day of Surgery  Do not apply any lotions/deoderants the morning of surgery.  Please wear clean clothes to the hospital/surgery center.     Please read over the following fact sheets that you  were given. Pain Booklet, Coughing and Deep Breathing and Surgical Site Infection Prevention

## 2016-09-29 NOTE — Pre-Procedure Instructions (Signed)
SACRED [place]A  09/29/2016      [facility] [place] - [place] [address] [place] ([place]) [place] [address] Phone: [phone] Fax: [phone]  [id number] [place], [place]. [place] [address] Phone: [phone] Fax: [phone]  [facility] [place], [place] to [facility]istered [place] [address] Phone: [phone] Fax: [phone]  [facility] [place], [place] [address] [place] [address] Phone: [phone] Fax: [phone]    Your procedure is scheduled on Sept 26  Report to [facility] at [phone]  Call this number if you have problems the morning of surgery:  [phone]   Remember:  Do not eat food or drink liquids after midnight.  Take these medicines the morning of surgery with A SIP OF WATER Metoprolol tartrate (Lopressor), Propranolol (Inderal) if needed  Stop eliquis as directed by your Dr.   Stop taking aspirin. BC's, Goody's, Herbal medications, Ibuprofen, Advil, Motrin, Aleve, Vitamins starting today 09/29/16   Do not wear jewelry, make-up or nail polish.  Do not wear lotions, powders, or perfumes, or deoderant.  Do not shave 48 hours prior to surgery.  Men may shave face and neck.  Do not bring valuables to the hospital.  [facility] is not responsible for any belongings or valuables.  Contacts, dentures or bridgework may not be worn into surgery.  Leave your suitcase in the car.  After surgery it may be brought to your room.  For patients admitted to the hospital, discharge time will be determined by your treatment team.  Patients discharged the day of surgery will not be allowed to drive home. Special instructions:  [facility] - Preparing for Surgery  Before surgery, you can play an important role.  Because skin is  not sterile, your skin needs to be as free of germs as possible.  You can reduce the number of germs on you skin by washing with CHG (chlorahexidine gluconate) soap before surgery.  CHG is an antiseptic cleaner which kills germs and bonds with the skin to continue killing germs even after washing.  Please DO NOT use if you have an allergy to CHG or antibacterial soaps.  If your skin becomes reddened/irritated stop using the CHG and inform your nurse when you arrive at Short Stay.  Do not shave (including legs and underarms) for at least 48 hours prior to the first CHG shower.  You may shave your face.  Please follow these instructions carefully:   1.  Shower with CHG Soap the night before surgery and the morning of Surgery.  2.  If you choose to wash your hair, wash your hair first as usual with your normal shampoo.  3.  After you shampoo, rinse your hair and body thoroughly to remove the Shampoo.  4.  Use CHG as you would any other liquid soap.  You can apply chg directly  to the skin and wash gently with scrungie or a clean washcloth.  5.  Apply the CHG Soap to your body ONLY FROM THE NECK DOWN.   Do not use on open wounds or open sores.  Avoid contact with your eyes,       ears, mouth and genitals (private parts).  Wash genitals (private parts)   with your normal soap.  6.  Wash  thoroughly, paying special attention to the area where your surgery will be performed.  7.  Thoroughly rinse your body with warm water from the neck down.  8.  DO NOT shower/wash with your normal soap after using and rinsing off the CHG Soap.  9.  Pat yourself dry with a clean towel.            10.  Wear clean pajamas.            11.  Place clean sheets on your bed the night of your first shower and do not  sleep with pets.  Day of Surgery  Do not apply any lotions/deoderants the morning of surgery.  Please wear clean clothes to the hospital/surgery center.     Please read over the following fact sheets that you  were given. Pain Booklet, Coughing and Deep Breathing and Surgical Site Infection Prevention

## 2016-09-30 ENCOUNTER — Encounter (HOSPITAL_COMMUNITY): Payer: Self-pay

## 2016-09-30 NOTE — H&P (Signed)
CHATARA LUCENTE 09/11/2016 3:47 PM Location: [facility] Patient #: [id number] DOB: [birth year] Married / Language: English / Race: Black or African American Female   History of Present Illness (Omayra Tulloch A. Ninfa Linden MD; 09/11/2016 4:20 PM) The patient is a 61 year old female who presents with abdominal pain. This is a pleasant 61 year old female referred by Dr. Juanita Craver for evaluation of biliary dyskinesia. She has had a 3 month history of epigastric abdominal pain. The pain refers to the right and left upper quadrants. She has nausea as well. This always occurs after fatty meals. Pain is described as sharp and moderate to severe. Bowel movements are normal. She had an ultrasound which was unremarkable. She had a HIDA scan showing a 39% ejection fraction. She had reproduction of all her symptoms during Ensure ingestion with the HIDA scan. She is currently on blood thinning medication for atrial fibrillation. She currently has no other complaints. She has been on multiple medications including antacids without any relief of her symptoms.   Allergies (April Staton, CMA; 09/11/2016 3:48 PM) Lactose Intolerance  Tramadol  Hydrocodone-Acetaminophen *ANALGESICS - OPIOID*  Allergies Reconciled   Medication History (April Staton, CMA; 09/11/2016 3:48 PM) Amlodipine Besylate-Valsartan (5-160MG  Tablet, Oral) Active. BuPROPion HCl ER (XL) (150MG  Tablet ER 24HR, Oral) Active. Eliquis (5MG  Tablet, Oral) Active. HydroCHLOROthiazide (12.5MG  Capsule, Oral) Active. Metoprolol Tartrate (25MG  Tablet, Oral) Active. Minivelle (0.05MG /24HR Patch TW, Transdermal) Active. Calcium 600 + D (600-200MG -UNIT Tablet, Oral) Active. Medications Reconciled  Vitals (April Staton CMA; 09/11/2016 3:48 PM) 09/11/2016 3:48 PM Weight: 229 lb Height: 65in Body Surface Area: 2.1 m Body Mass Index: 38.11 kg/m  Temp.: 97.50F(Oral)  Pulse: 77 (Regular)  P.OX: 96% (Room air) BP: 122/82  (Sitting, Left Arm, Standard)       Physical Exam (Bennet Kujawa A. Ninfa Linden MD; 09/11/2016 4:21 PM) General Mental Status-Alert. General Appearance-Consistent with stated age. Hydration-Well hydrated. Voice-Normal.  Head and Neck Head-normocephalic, atraumatic with no lesions or palpable masses.  Eye Eyeball - Bilateral-Extraocular movements intact. Sclera/Conjunctiva - Bilateral-No scleral icterus.  Chest and Lung Exam Chest and lung exam reveals -quiet, even and easy respiratory effort with no use of accessory muscles and on auscultation, normal breath sounds, no adventitious sounds and normal vocal resonance. Inspection Chest Wall - Normal. Back - normal.  Cardiovascular Cardiovascular examination reveals -on palpation PMI is normal in location and amplitude, no palpable S3 or S4. Normal cardiac borders., normal heart sounds, regular rate and rhythm with no murmurs, carotid auscultation reveals no bruits and normal pedal pulses bilaterally.  Abdomen Inspection Inspection of the abdomen reveals - No Hernias. Skin - Scar - no surgical scars. Palpation/Percussion Palpation and Percussion of the abdomen reveal - Soft, No Rebound tenderness, No Rigidity (guarding) and No hepatosplenomegaly. Tenderness - Right Upper Quadrant. Auscultation Auscultation of the abdomen reveals - Bowel sounds normal.  Neurologic - Did not examine.  Musculoskeletal - Did not examine.    Assessment & Plan (Shondra Capps A. Ninfa Linden MD; 09/11/2016 4:22 PM) BILIARY DYSKINESIA (K82.8) Impression: This is a patient with biliary dyskinesia and I suspect chronic cholecystitis. We discussed her diagnosis in detail. We discussed continued expectant management versus proceeding with a laparoscopic cholecystectomy. I discussed the surgical procedure in detail and gave her literature regarding the surgery. We discussed the risks which includes but is not limited to bleeding, infection, bile duct injury,  bile leak, injury to other structures, the chances may not resolve all her symptoms, cardiopulmonary issues, postoperative recovery, etc. She will stop her blood thinning medication  5 days preoperatively. We will get clearance from cardiology. She is either proceed with surgery

## 2016-09-30 NOTE — Progress Notes (Signed)
Anesthesia Chart Review: Patient is a 61 year old female scheduled for laparoscopic cholecystectomy on 10/01/16 by Dr. Coralie Keens.   History includes DIFFICULT INTUBATION, post-operative N/V, never smoker, anemia, PAF (several episodes 2005-2007; recurrent afib with RVR 03/22/15), chronic diastolic CHF, HTN, GERD, OSA, asthma, depression, fatty liver, left adrenal myelolipoma on 05/09/13 CT (adrenal glands documented as normal on 12/25/13 CT), breast reduction '02, appendectomy, BSO with partial omentectomy 04/09/30 (complicated by post-operative ileus versus partial SBO requiring re-admission), hysterectomy, cervical fusion, L3-5 PLIF 09/27/14. BMI is consistent with obesity.   DATE  ANESTHESIA RECORD SUMMARY  09/27/14 [facility] Intubation Type: IV induction, Rapid sequence and Cricoid Pressure applied Ventilation: Mask ventilation without difficulty and Two handed mask ventilation required Laryngoscope Size: Glidescope Grade View: Grade I Tube type: Oral Tube size: 7.0 mm Number of attempts: 1 Airway Equipment and Method: Stylet and Video-laryngoscopy Difficulty Due To: Difficulty was anticipated Future Recommendations: Recommend- induction with short-acting agent, and alternative techniques readily available Comments: Glidescope electively used due to previous difficult intubation noted. Intubation uneventful with Glidescope.  04/09/04 [facility] Per my review of 2006 records in 2016, "According to Dr. Lennox Grumbles records, difficult intubation was unpredicted. Reasons for difficultly included reduced neck mobility and immobile epiglottis. DVL X 3 (CRNA) and then X 1 (MDA) with Sabra Heck 2--cords were visualized but ETT could not be passed. No cord visualization with MAC 3. Bag/mask ventilation was easy. Patient's airway was ultimately secured asleep with intubating LMA. (Five attempts total at intubation.) Future recommendations included induction with short acting agent and alternative techniques  readily available."   - PCP is listed as Dr. Seward Carol.  - Cardiologist is Dr. Mertie Moores. Last visit 09/15/16. He wrote, "She needs to have gall bladder surgery .   She is at low risk for her surgery and may hold the Eliquis for 2 days prior to her procedure."  Meds include Eliquis (on hold 09/27/16),  HCTZ, Lopressor, Minivelle, olmesartan, propranolol PRN palpitations.   BP (!) 135/54   Pulse (!) 55   Temp 36.7 C   Resp 20   Ht _0  (1.651 m)   Wt 230 lb 9.6 oz (104.6 kg)   SpO2 95%   BMI 38.37 kg/m   EKG 09/15/16: SB at 54 bpm, moderate voltage criteria for LVH, may be normal variant. Non-specific ST/T wave abnormality.  Echo 04/06/15: Study Conclusions - Left ventricle: The cavity size was normal. Systolic function was   normal. The estimated ejection fraction was in the range of 55%   to 60%. Wall motion was normal; there were no regional wall   motion abnormalities. Features are consistent with a pseudonormal   left ventricular filling pattern, with concomitant abnormal   relaxation and increased filling pressure (grade 2 diastolic   dysfunction). - Aortic valve: Trileaflet; normal thickness, mildly calcified   leaflets. - Mitral valve: There was trivial regurgitation. - Tricuspid valve: There was trivial regurgitation. Impressions: - Normal LVF EF 55-60%, trivial TR, normal LA size, grade 2   diastolic dysfunction.  Nuclear stress test 04/06/15:  Nuclear stress EF: 61%.  There was no ST segment deviation noted during stress.  The study is normal.  This is a low risk study.  The left ventricular ejection fraction is normal (55-65%).  Normal pharmacologic nuclear stress test with no evidence of prior infarct or ischemia.   [facility] 07/19/08: CONCLUSION: Normal coronary angiography and left ventricular wall motion. LVEF 70%.  Preoperative labs noted. BMET, CBC, PT/PTT WNL. A1c 5.6%.  If no acute changes then I anticipate that she can proceed as  planned.  [facility] Short Stay Center/Anesthesiology Phone [phone] 09/30/2016 12:11 PM

## 2016-10-01 ENCOUNTER — Ambulatory Visit (HOSPITAL_COMMUNITY): Admitting: Anesthesiology

## 2016-10-01 ENCOUNTER — Encounter (HOSPITAL_COMMUNITY): Payer: Self-pay | Admitting: Certified Registered"

## 2016-10-01 ENCOUNTER — Ambulatory Visit (HOSPITAL_COMMUNITY): Admitting: Vascular Surgery

## 2016-10-01 ENCOUNTER — Encounter (HOSPITAL_COMMUNITY)
Admission: RE | Disposition: A | Discharge status: Home / Self Care | Payer: Self-pay | Source: Ambulatory Visit | Attending: Surgery

## 2016-10-01 ENCOUNTER — Observation Stay (HOSPITAL_COMMUNITY)
Admission: RE | Admit: 2016-10-01 | Discharge: 2016-10-02 | Disposition: A | Discharge status: Home / Self Care | Source: Ambulatory Visit | Attending: Surgery | Admitting: Surgery

## 2016-10-01 DIAGNOSIS — Z79899 Other long term (current) drug therapy: Secondary | ICD-10-CM | POA: Insufficient documentation

## 2016-10-01 DIAGNOSIS — I5032 Chronic diastolic (congestive) heart failure: Secondary | ICD-10-CM | POA: Insufficient documentation

## 2016-10-01 DIAGNOSIS — F329 Major depressive disorder, single episode, unspecified: Secondary | ICD-10-CM | POA: Insufficient documentation

## 2016-10-01 DIAGNOSIS — K811 Chronic cholecystitis: Secondary | ICD-10-CM | POA: Diagnosis not present

## 2016-10-01 DIAGNOSIS — Z7902 Long term (current) use of antithrombotics/antiplatelets: Secondary | ICD-10-CM | POA: Insufficient documentation

## 2016-10-01 DIAGNOSIS — I110 Hypertensive heart disease with heart failure: Secondary | ICD-10-CM | POA: Insufficient documentation

## 2016-10-01 DIAGNOSIS — I480 Paroxysmal atrial fibrillation: Secondary | ICD-10-CM | POA: Insufficient documentation

## 2016-10-01 DIAGNOSIS — K828 Other specified diseases of gallbladder: Secondary | ICD-10-CM | POA: Diagnosis present

## 2016-10-01 DIAGNOSIS — K219 Gastro-esophageal reflux disease without esophagitis: Secondary | ICD-10-CM | POA: Insufficient documentation

## 2016-10-01 DIAGNOSIS — G4733 Obstructive sleep apnea (adult) (pediatric): Secondary | ICD-10-CM | POA: Insufficient documentation

## 2016-10-01 HISTORY — PX: CHOLECYSTECTOMY: SHX55

## 2016-10-01 SURGERY — LAPAROSCOPIC CHOLECYSTECTOMY
Anesthesia: General | Site: Abdomen

## 2016-10-01 MED ORDER — CEFAZOLIN SODIUM-DEXTROSE 2-4 GM/100ML-% IV SOLN
INTRAVENOUS | Status: AC
  Filled 2016-10-01: qty 100

## 2016-10-01 MED ORDER — SUGAMMADEX SODIUM 200 MG/2ML IV SOLN
INTRAVENOUS | Status: AC
  Filled 2016-10-01: qty 2

## 2016-10-01 MED ORDER — ONDANSETRON HCL 4 MG/2ML IJ SOLN
INTRAMUSCULAR | Status: DC | PRN
  Administered 2016-10-01: 4 mg via INTRAVENOUS

## 2016-10-01 MED ORDER — CHLORHEXIDINE GLUCONATE CLOTH 2 % EX PADS: 6.0000 | MEDICATED_PAD | Freq: Once | CUTANEOUS | Status: DC

## 2016-10-01 MED ORDER — FENTANYL CITRATE (PF) 100 MCG/2ML IJ SOLN
INTRAMUSCULAR | Status: AC
  Filled 2016-10-01: qty 2

## 2016-10-01 MED ORDER — MENTHOL 3 MG MT LOZG
1.0000 | LOZENGE | OROMUCOSAL | Status: DC | PRN
  Filled 2016-10-01 (×2): qty 9

## 2016-10-01 MED ORDER — SUGAMMADEX SODIUM 500 MG/5ML IV SOLN
INTRAVENOUS | Status: DC | PRN
Start: 2016-10-01 — End: 2016-10-01
  Administered 2016-10-01: 500 mg via INTRAVENOUS

## 2016-10-01 MED ORDER — BUPIVACAINE-EPINEPHRINE (PF) 0.25% -1:200000 IJ SOLN
INTRAMUSCULAR | Status: AC
  Filled 2016-10-01: qty 30

## 2016-10-01 MED ORDER — POTASSIUM CHLORIDE IN NACL 20-0.9 MEQ/L-% IV SOLN
INTRAVENOUS | Status: DC
  Administered 2016-10-01: 18:00:00 via INTRAVENOUS
  Filled 2016-10-01: qty 1000

## 2016-10-01 MED ORDER — LACTATED RINGERS IV SOLN
INTRAVENOUS | Status: DC
  Administered 2016-10-01: 11:00:00 via INTRAVENOUS

## 2016-10-01 MED ORDER — PROPOFOL 10 MG/ML IV BOLUS
INTRAVENOUS | Status: DC | PRN
  Administered 2016-10-01: 200 mg via INTRAVENOUS

## 2016-10-01 MED ORDER — FENTANYL CITRATE (PF) 250 MCG/5ML IJ SOLN
INTRAMUSCULAR | Status: AC
  Filled 2016-10-01: qty 5

## 2016-10-01 MED ORDER — DIPHENHYDRAMINE HCL 50 MG/ML IJ SOLN: 25.0000 mg | Freq: Four times a day (QID) | INTRAMUSCULAR | Status: DC | PRN

## 2016-10-01 MED ORDER — PROPOFOL 10 MG/ML IV BOLUS
INTRAVENOUS | Status: AC
  Filled 2016-10-01: qty 20

## 2016-10-01 MED ORDER — PROMETHAZINE HCL 25 MG/ML IJ SOLN
6.2500 mg | INTRAMUSCULAR | Status: DC | PRN
  Administered 2016-10-01: 6.25 mg via INTRAVENOUS

## 2016-10-01 MED ORDER — PROMETHAZINE HCL 25 MG/ML IJ SOLN
INTRAMUSCULAR | Status: AC
  Filled 2016-10-01: qty 1

## 2016-10-01 MED ORDER — FENTANYL CITRATE (PF) 100 MCG/2ML IJ SOLN
INTRAMUSCULAR | Status: DC | PRN
  Administered 2016-10-01: 100 ug via INTRAVENOUS

## 2016-10-01 MED ORDER — MORPHINE SULFATE (PF) 4 MG/ML IV SOLN: 1.0000 mg | INTRAVENOUS | Status: DC | PRN

## 2016-10-01 MED ORDER — ONDANSETRON HCL 4 MG/2ML IJ SOLN: 4.0000 mg | Freq: Four times a day (QID) | INTRAMUSCULAR | Status: DC | PRN

## 2016-10-01 MED ORDER — CEFAZOLIN SODIUM-DEXTROSE 2-4 GM/100ML-% IV SOLN
2.0000 g | INTRAVENOUS | Status: AC
  Administered 2016-10-01: 2 g via INTRAVENOUS

## 2016-10-01 MED ORDER — 0.9 % SODIUM CHLORIDE (POUR BTL) OPTIME
TOPICAL | Status: DC | PRN
  Administered 2016-10-01: 1000 mL

## 2016-10-01 MED ORDER — BUPIVACAINE-EPINEPHRINE 0.25% -1:200000 IJ SOLN
INTRAMUSCULAR | Status: DC | PRN
  Administered 2016-10-01: 20 mL

## 2016-10-01 MED ORDER — DIPHENHYDRAMINE HCL 25 MG PO CAPS: 25.0000 mg | ORAL_CAPSULE | Freq: Four times a day (QID) | ORAL | Status: DC | PRN

## 2016-10-01 MED ORDER — FENTANYL CITRATE (PF) 100 MCG/2ML IJ SOLN
25.0000 ug | INTRAMUSCULAR | Status: DC | PRN
  Administered 2016-10-01 (×2): 50 ug via INTRAVENOUS

## 2016-10-01 MED ORDER — SUCCINYLCHOLINE CHLORIDE 200 MG/10ML IV SOSY
PREFILLED_SYRINGE | INTRAVENOUS | Status: AC
  Filled 2016-10-01: qty 20

## 2016-10-01 MED ORDER — OXYCODONE HCL 5 MG PO TABS
5.0000 mg | ORAL_TABLET | ORAL | Status: DC | PRN
  Administered 2016-10-01 – 2016-10-02 (×4): 5 mg via ORAL
  Filled 2016-10-01: qty 1
  Filled 2016-10-01: qty 2
  Filled 2016-10-01 (×2): qty 1

## 2016-10-01 MED ORDER — SODIUM CHLORIDE 0.9 % IR SOLN
Status: DC | PRN
  Administered 2016-10-01: 1000 mL

## 2016-10-01 MED ORDER — MIDAZOLAM HCL 5 MG/5ML IJ SOLN
INTRAMUSCULAR | Status: DC | PRN
Start: 2016-10-01 — End: 2016-10-01
  Administered 2016-10-01: 2 mg via INTRAVENOUS

## 2016-10-01 MED ORDER — ONDANSETRON 4 MG PO TBDP: 4.0000 mg | ORAL_TABLET | Freq: Four times a day (QID) | ORAL | Status: DC | PRN

## 2016-10-01 MED ORDER — ROCURONIUM BROMIDE 10 MG/ML (PF) SYRINGE
PREFILLED_SYRINGE | INTRAVENOUS | Status: AC
  Filled 2016-10-01: qty 10

## 2016-10-01 MED ORDER — MIDAZOLAM HCL 2 MG/2ML IJ SOLN
INTRAMUSCULAR | Status: AC
  Filled 2016-10-01: qty 2

## 2016-10-01 MED ORDER — METOPROLOL TARTRATE 25 MG PO TABS
25.0000 mg | ORAL_TABLET | Freq: Two times a day (BID) | ORAL | Status: DC
Start: 2016-10-01 — End: 2016-10-02
  Administered 2016-10-02: 25 mg via ORAL
  Filled 2016-10-01 (×2): qty 1

## 2016-10-01 MED ORDER — LIDOCAINE 2% (20 MG/ML) 5 ML SYRINGE
INTRAMUSCULAR | Status: AC
  Filled 2016-10-01: qty 10

## 2016-10-01 MED ORDER — SUCCINYLCHOLINE CHLORIDE 20 MG/ML IJ SOLN
INTRAMUSCULAR | Status: DC | PRN
  Administered 2016-10-01: 140 mg via INTRAVENOUS

## 2016-10-01 MED ORDER — IRBESARTAN 150 MG PO TABS
150.0000 mg | ORAL_TABLET | Freq: Every day | ORAL | Status: DC
  Administered 2016-10-01 – 2016-10-02 (×2): 150 mg via ORAL
  Filled 2016-10-01 (×2): qty 1

## 2016-10-01 MED ORDER — ROCURONIUM BROMIDE 100 MG/10ML IV SOLN
INTRAVENOUS | Status: DC | PRN
  Administered 2016-10-01: 40 mg via INTRAVENOUS

## 2016-10-01 MED ORDER — LIDOCAINE HCL (CARDIAC) 20 MG/ML IV SOLN
INTRAVENOUS | Status: DC | PRN
  Administered 2016-10-01: 60 mg via INTRAVENOUS

## 2016-10-01 MED ORDER — ONDANSETRON HCL 4 MG/2ML IJ SOLN
INTRAMUSCULAR | Status: AC
  Filled 2016-10-01: qty 2

## 2016-10-01 MED ORDER — SUGAMMADEX SODIUM 500 MG/5ML IV SOLN
INTRAVENOUS | Status: AC
  Filled 2016-10-01: qty 5

## 2016-10-01 SURGICAL SUPPLY — 40 items
ADH SKN CLS APL DERMABOND .7 (GAUZE/BANDAGES/DRESSINGS) ×1
APPLIER CLIP 5 13 M/L LIGAMAX5 (MISCELLANEOUS) ×2
APR CLP MED LRG 5 ANG JAW (MISCELLANEOUS) ×1
BAG SPEC RTRVL LRG 6X4 10 (ENDOMECHANICALS) ×1
CANISTER SUCT 3000ML PPV (MISCELLANEOUS) ×2 IMPLANT
CHLORAPREP W/TINT 26ML (MISCELLANEOUS) ×2 IMPLANT
CLIP APPLIE 5 13 M/L LIGAMAX5 (MISCELLANEOUS) ×1 IMPLANT
COVER SURGICAL LIGHT HANDLE (MISCELLANEOUS) ×2 IMPLANT
DERMABOND ADVANCED (GAUZE/BANDAGES/DRESSINGS) ×1
DERMABOND ADVANCED .7 DNX12 (GAUZE/BANDAGES/DRESSINGS) ×1 IMPLANT
ELECT REM PT RETURN 9FT ADLT (ELECTROSURGICAL) ×2
ELECTRODE REM PT RTRN 9FT ADLT (ELECTROSURGICAL) ×1 IMPLANT
GLOVE BIOGEL PI IND STRL 8 (GLOVE) IMPLANT
GLOVE BIOGEL PI IND STRL 8.5 (GLOVE) IMPLANT
GLOVE BIOGEL PI INDICATOR 8 (GLOVE) ×1
GLOVE BIOGEL PI INDICATOR 8.5 (GLOVE) ×1
GLOVE ECLIPSE 8.0 STRL XLNG CF (GLOVE) ×1 IMPLANT
GLOVE SURG SIGNA 7.5 PF LTX (GLOVE) ×2 IMPLANT
GLOVE SURG SS PI 6.5 STRL IVOR (GLOVE) ×1 IMPLANT
GLOVE SURG SS PI 8.0 STRL IVOR (GLOVE) ×1 IMPLANT
GOWN STRL REUS W/ TWL LRG LVL3 (GOWN DISPOSABLE) ×2 IMPLANT
GOWN STRL REUS W/ TWL XL LVL3 (GOWN DISPOSABLE) ×1 IMPLANT
GOWN STRL REUS W/TWL LRG LVL3 (GOWN DISPOSABLE)
GOWN STRL REUS W/TWL XL LVL3 (GOWN DISPOSABLE) ×6
KIT BASIN OR (CUSTOM PROCEDURE TRAY) ×2 IMPLANT
KIT ROOM TURNOVER OR (KITS) ×2 IMPLANT
NS IRRIG 1000ML POUR BTL (IV SOLUTION) ×2 IMPLANT
PAD ARMBOARD 7.5X6 YLW CONV (MISCELLANEOUS) ×2 IMPLANT
POUCH SPECIMEN RETRIEVAL 10MM (ENDOMECHANICALS) ×2 IMPLANT
SCISSORS LAP 5X35 DISP (ENDOMECHANICALS) ×2 IMPLANT
SET IRRIG TUBING LAPAROSCOPIC (IRRIGATION / IRRIGATOR) ×2 IMPLANT
SLEEVE ENDOPATH XCEL 5M (ENDOMECHANICALS) ×4 IMPLANT
SPECIMEN JAR SMALL (MISCELLANEOUS) ×2 IMPLANT
SUT MNCRL AB 4-0 PS2 18 (SUTURE) ×2 IMPLANT
TOWEL OR 17X24 6PK STRL BLUE (TOWEL DISPOSABLE) ×2 IMPLANT
TOWEL OR 17X26 10 PK STRL BLUE (TOWEL DISPOSABLE) ×2 IMPLANT
TRAY LAPAROSCOPIC MC (CUSTOM PROCEDURE TRAY) ×2 IMPLANT
TROCAR XCEL BLUNT TIP 100MML (ENDOMECHANICALS) ×2 IMPLANT
TROCAR XCEL NON-BLD 5MMX100MML (ENDOMECHANICALS) ×2 IMPLANT
TUBING INSUFFLATION (TUBING) ×2 IMPLANT

## 2016-10-01 NOTE — Interval H&P Note (Signed)
History and Physical Interval Note: no change in H and P  10/01/2016 11:05 AM  [name]  has presented today for surgery, with the diagnosis of biliary dyskinesia  The various methods of treatment have been discussed with the patient and family. After consideration of risks, benefits and other options for treatment, the patient has consented to  Procedure(s): LAPAROSCOPIC CHOLECYSTECTOMY (N/A) as a surgical intervention .  The patient's history has been reviewed, patient examined, no change in status, stable for surgery.  I have reviewed the patient's chart and labs.  Questions were answered to the patient's satisfaction.     [name]

## 2016-10-01 NOTE — Anesthesia Preprocedure Evaluation (Signed)
Anesthesia Evaluation  Patient identified by MRN, date of birth, ID band Patient awake    Reviewed: Allergy & Precautions, NPO status , Patient's Chart, lab work & pertinent test results  History of Anesthesia Complications (+) PONV and DIFFICULT AIRWAY  Airway Mallampati: III  TM Distance: >3 FB Neck ROM: Full    Dental  (+) Dental Advisory Given   Pulmonary asthma , sleep apnea ,    breath sounds clear to auscultation       Cardiovascular hypertension, Pt. on medications and Pt. on home beta blockers + dysrhythmias Atrial Fibrillation  Rhythm:Regular Rate:Normal     Neuro/Psych negative neurological ROS     GI/Hepatic Neg liver ROS, hiatal hernia, GERD  ,  Endo/Other  negative endocrine ROS  Renal/GU negative Renal ROS     Musculoskeletal  (+) Arthritis ,   Abdominal   Peds  Hematology  (+) anemia ,   Anesthesia Other Findings   Reproductive/Obstetrics                             Lab Results  Component Value Date   WBC 5.5 09/29/2016   HGB 14.5 09/29/2016   HCT 44.0 09/29/2016   MCV 97.8 09/29/2016   PLT 300 09/29/2016   Lab Results  Component Value Date   CREATININE 0.91 09/29/2016   BUN 8 09/29/2016   NA 139 09/29/2016   K 3.9 09/29/2016   CL 105 09/29/2016   CO2 26 09/29/2016   Echo 04/06/15: Study Conclusions - Left ventricle: The cavity size was normal. Systolic function wasnormal. The estimated ejection fraction was in the range of 55%to 60%. Wall motion was normal; there were no regional wallmotion abnormalities. Features are consistent with a pseudonormalleft ventricular filling pattern, with concomitant abnormalrelaxation and increased filling pressure (grade 2 diastolicdysfunction). - Aortic valve: Trileaflet; normal thickness, mildly calcified leaflets. - Mitral valve: There was trivial regurgitation. - Tricuspid valve: There was trivial  regurgitation. Impressions: - Normal LVF EF 55-60%, trivial TR, normal LA size, grade 2diastolic dysfunction.  Nuclear stress test 04/06/15:  Nuclear stress EF: 61%.  There was no ST segment deviation noted during stress.  The study is normal.  This is a low risk study.  The left ventricular ejection fraction is normal (55-65%).Normal pharmacologic nuclear stress test with no evidence of prior infarct or ischemia.  Anesthesia Physical Anesthesia Plan  ASA: II  Anesthesia Plan: General   Post-op Pain Management:    Induction:   PONV Risk Score and Plan: 4 or greater and Ondansetron, Dexamethasone, Midazolam, Scopolamine patch - Pre-op and Treatment may vary due to age or medical condition  Airway Management Planned: Oral ETT and Video Laryngoscope Planned  Additional Equipment:   Intra-op Plan:   Post-operative Plan: Extubation in OR  Informed Consent: I have reviewed the patients History and Physical, chart, labs and discussed the procedure including the risks, benefits and alternatives for the proposed anesthesia with the patient or authorized representative who has indicated his/her understanding and acceptance.   Dental advisory given  Plan Discussed with: CRNA  Anesthesia Plan Comments:         Anesthesia Quick Evaluation

## 2016-10-01 NOTE — Anesthesia Procedure Notes (Signed)
Procedure Name: Intubation Date/Time: 10/01/2016 12:18 PM Performed by: Manuela Schwartz B Pre-anesthesia Checklist: Patient identified, Emergency Drugs available, Suction available and Patient being monitored Patient Re-evaluated:Patient Re-evaluated prior to induction Oxygen Delivery Method: Circle System Utilized Preoxygenation: Pre-oxygenation with 100% oxygen Induction Type: IV induction and Rapid sequence Tube type: Oral Number of attempts: 1 Airway Equipment and Method: Stylet and Video-laryngoscopy (elective glidescope intubation) Placement Confirmation: ETT inserted through vocal cords under direct vision,  positive ETCO2 and breath sounds checked- equal and bilateral Secured at: 21 cm Tube secured with: Tape Dental Injury: Teeth and Oropharynx as per pre-operative assessment

## 2016-10-01 NOTE — Transfer of Care (Signed)
Immediate Anesthesia Transfer of Care Note  Patient: [name]  Procedure(s) Performed: Procedure(s): LAPAROSCOPIC CHOLECYSTECTOMY (N/A)  Patient Location: PACU  Anesthesia Type:General  Level of Consciousness: awake, alert  and oriented  Airway & Oxygen Therapy: Patient Spontanous Breathing and Patient connected to nasal cannula oxygen  Post-op Assessment: Report given to RN and Post -op Vital signs reviewed and stable  Post vital signs: Reviewed and stable  Last Vitals:  Vitals:   10/01/16 1056  BP: (!) 194/81  Pulse: (!) 55  Resp: 20  Temp: 36.4 C  SpO2: 100%    Last Pain:  Vitals:   10/01/16 1056  TempSrc: Oral         Complications: No apparent anesthesia complications

## 2016-10-01 NOTE — Op Note (Signed)

## 2016-10-01 NOTE — Progress Notes (Signed)
1730 received pt from PACU, A&O x4. Pt voided right after coming to the room. Abd lap sites with skin glue dry and intact.

## 2016-10-01 NOTE — Anesthesia Postprocedure Evaluation (Signed)
Anesthesia Post Note  Patient: [name]  Procedure(s) Performed: Procedure(s) (LRB): LAPAROSCOPIC CHOLECYSTECTOMY (N/A)     Patient location during evaluation: PACU Anesthesia Type: General Level of consciousness: awake and alert Pain management: pain level controlled Vital Signs Assessment: post-procedure vital signs reviewed and stable Respiratory status: spontaneous breathing, nonlabored ventilation, respiratory function stable and patient connected to nasal cannula oxygen Cardiovascular status: blood pressure returned to baseline and stable Postop Assessment: no apparent nausea or vomiting Anesthetic complications: no    Last Vitals:  Vitals:   10/01/16 1450 10/01/16 1520  BP: (!) 146/79 139/77  Pulse: (!) 52 (!) 56  Resp: (!) 21 16  Temp:    SpO2: 100% 100%    Last Pain:  Vitals:   10/01/16 1524  TempSrc:   PainSc: Asleep                 [name]

## 2016-10-02 ENCOUNTER — Encounter (HOSPITAL_COMMUNITY): Payer: Self-pay | Admitting: Surgery

## 2016-10-02 DIAGNOSIS — K811 Chronic cholecystitis: Secondary | ICD-10-CM | POA: Diagnosis not present

## 2016-10-02 LAB — CBC
HCT: 40.1 % (ref 36.0–46.0)
Hemoglobin: 13 g/dL (ref 12.0–15.0)
MCH: 32.4 pg (ref 26.0–34.0)
MCHC: 32.4 g/dL (ref 30.0–36.0)
MCV: 100 fL (ref 78.0–100.0)
Platelets: 288 10*3/uL (ref 150–400)
RBC: 4.01 MIL/uL (ref 3.87–5.11)
RDW: 13.4 % (ref 11.5–15.5)
WBC: 8.3 10*3/uL (ref 4.0–10.5)

## 2016-10-02 LAB — BASIC METABOLIC PANEL
Anion gap: 7 (ref 5–15)
BUN: 5 mg/dL — ABNORMAL LOW (ref 6–20)
CO2: 24 mmol/L (ref 22–32)
Calcium: 8.6 mg/dL — ABNORMAL LOW (ref 8.9–10.3)
Chloride: 106 mmol/L (ref 101–111)
Creatinine, Ser: 0.94 mg/dL (ref 0.44–1.00)
GFR calc Af Amer: >60 mL/min (ref >60)
GFR calc non Af Amer: >60 mL/min (ref >60)
Glucose, Bld: 91 mg/dL (ref 65–99)
Potassium: 4.2 mmol/L (ref 3.5–5.1)
Sodium: 137 mmol/L (ref 135–145)

## 2016-10-02 MED ORDER — OXYCODONE HCL 5 MG PO TABS: 5.0000 mg | ORAL_TABLET | Freq: Four times a day (QID) | ORAL | 0 refills | Status: DC | PRN

## 2016-10-02 NOTE — Discharge Summary (Signed)
Physician Discharge Summary  Patient ID: [name] MRN: [id number] DOB/AGE: [birth year] 61 y.o.  Admit date: 10/01/2016 Discharge date: 10/02/2016  Admission Diagnoses:  Discharge Diagnoses:  Active Problems:   Biliary dyskinesia   Discharged Condition: good  Hospital Course: uneventful post op recovery.  Discharged home pod #1  Consults: None  Significant Diagnostic Studies:   Treatments: surgery: lap chole  Discharge Exam: Blood pressure (!) 144/70, pulse (!) 58, temperature 98.9 F (37.2 C), resp. rate 18, weight 104.3 kg (230 lb), SpO2 100 %. General appearance: alert, cooperative and no distress Resp: clear to auscultation bilaterally Cardio: regular rate and rhythm, S1, S2 normal, no murmur, click, rub or gallop Incision/Wound:abdomen soft, incisions clean  Disposition: 01-Home or Self Care  Discharge Instructions    Discharge patient    Complete by:  As directed    After lunch today Patient also requests to talk to someone from Anesthesia about red eyes and throat discomfort   Discharge disposition:  01-Home or Self Care   Discharge patient date:  10/02/2016     Allergies as of 10/02/2016      Reactions   Other Other (See Comments)   ekg pads burns skin needs hypoallergenic   Losartan Other (See Comments)   Chest pain.    Tramadol Other (See Comments)   GI upset, "I felt like I was in a fog"      Medication List    TAKE these medications   apixaban 5 MG Tabs tablet Commonly known as:  ELIQUIS Take 1 tablet (5 mg total) by mouth 2 (two) times daily.   CALCIUM 600+D 600-400 MG-UNIT tablet Generic drug:  Calcium Carbonate-Vitamin D Take 2 tablets by mouth daily.   hydrochlorothiazide 12.5 MG capsule Commonly known as:  MICROZIDE Take 1 capsule (12.5 mg total) by mouth daily. What changed:  when to take this   Lecithin 1200 MG Caps Take 1,200 mg by mouth 2 (two) times daily.   metoprolol tartrate 25 MG tablet Commonly known as:   LOPRESSOR Take 1 tablet (25 mg total) by mouth 2 (two) times daily.   milk thistle 175 MG tablet Take 150 mg by mouth 2 (two) times daily.   MINIVELLE 0.05 MG/24HR patch Generic drug:  estradiol Place 1 patch onto the skin See admin instructions. Apply 1 patch to skin twice weekly on Wednesday and Sunday   multivitamin tablet Take 1 tablet by mouth daily.   olmesartan 20 MG tablet Commonly known as:  BENICAR Take 1 tablet (20 mg total) by mouth daily.   oxyCODONE 5 MG immediate release tablet Commonly known as:  Oxy IR/ROXICODONE Take 1-2 tablets (5-10 mg total) by mouth every 6 (six) hours as needed for moderate pain.   propranolol 10 MG tablet Commonly known as:  INDERAL Take 1 tablet (10 mg total) by mouth 4 (four) times daily as needed. What changed:  reasons to take this   THERATEARS OP Place 2 drops into both eyes as needed (for dry eyes).            Discharge Care Instructions        Start     Ordered   10/02/16 0000  oxyCODONE (OXY IR/ROXICODONE) 5 MG immediate release tablet  Every 6 hours PRN     09 /27/18 0388   10/02/16 0000  Discharge patient    Comments:  After lunch today Patient also requests to talk to someone from Anesthesia about red eyes and throat discomfort  Question Answer Comment  Discharge disposition 01-Home or Self Care   Discharge patient date 10/02/2016      10/02/16 1287     Follow-up Information    Coralie Keens, MD. Schedule an appointment as soon as possible for a visit in 3 week(s).   Specialty:  General Surgery Contact information: [address] [place] [address] [phone]           Signed: Harl Bowie 10/02/2016, 7:08 AM

## 2016-10-02 NOTE — Progress Notes (Signed)
Patient ID: [name], female   DOB: [birth year], 61 y.o.   MRN: [id number]  Complains of sore throat from intubation and red eyes. Minimal abdominal pain Abdomen soft, minimally tender Voice normal Eyes with mild erythema  Plan: D/c home

## 2016-10-02 NOTE — Progress Notes (Signed)
Pt was ambulating to the hall, tolerated soft diet. Abd lap sites dry and intact. Discharge instructions was given to the, verbalized understanding. Discharged to home accompanied by spouse.

## 2016-10-02 NOTE — Discharge Instructions (Signed)
[facility] ______[facility] LAPAROSCOPIC SURGERY: POST OP INSTRUCTIONS Always review your discharge instruction sheet given to you by the facility where your surgery was performed. IF YOU HAVE DISABILITY OR FAMILY LEAVE FORMS, YOU MUST BRING THEM TO THE OFFICE FOR PROCESSING.   DO NOT GIVE THEM TO YOUR DOCTOR.  1. A prescription for pain medication may be given to you upon discharge.  Take your pain medication as prescribed, if needed.  If narcotic pain medicine is not needed, then you may take acetaminophen (Tylenol) or ibuprofen (Advil) as needed. 2. Take your usually prescribed medications unless otherwise directed. 3. If you need a refill on your pain medication, please contact your pharmacy.  They will contact our office to request authorization. Prescriptions will not be filled after 5pm or on week-ends. 4. You should follow a light diet the first few days after arrival home, such as soup and crackers, etc.  Be sure to include lots of fluids daily. 5. Most patients will experience some swelling and bruising in the area of the incisions.  Ice packs will help.  Swelling and bruising can take several days to resolve.  6. It is common to experience some constipation if taking pain medication after surgery.  Increasing fluid intake and taking a stool softener (such as Colace) will usually help or prevent this problem from occurring.  A mild laxative (Milk of Magnesia or Miralax) should be taken according to package instructions if there are no bowel movements after 48 hours. 7. Unless discharge instructions indicate otherwise, you may remove your bandages 24-48 hours after surgery, and you may shower at that time.  You may have steri-strips (small skin tapes) in place directly over the incision.  These strips should be left on the skin for 7-10 days.  If your surgeon used skin glue on the incision, you may shower in 24 hours.  The glue will flake off over the next 2-3 weeks.  Any sutures or  staples will be removed at the office during your follow-up visit. 8. ACTIVITIES:  You may resume regular (light) daily activities beginning the next day--such as daily self-care, walking, climbing stairs--gradually increasing activities as tolerated.  You may have sexual intercourse when it is comfortable.  Refrain from any heavy lifting or straining until approved by your doctor. a. You may drive when you are no longer taking prescription pain medication, you can comfortably wear a seatbelt, and you can safely maneuver your car and apply brakes. b. RETURN TO WORK:  __________________________________________________________ 9. You should see your doctor in the office for a follow-up appointment approximately 2-3 weeks after your surgery.  Make sure that you call for this appointment within a day or two after you arrive home to insure a convenient appointment time. 10. OTHER INSTRUCTIONS:no lifting more than 15 to 20 pounds for 2 weeks 11. Ok to shower today __________________________________________________________________________________________________________________________ __________________________________________________________________________________________________________________________ WHEN TO CALL YOUR DOCTOR: 1. Fever over 101.0 2. Inability to urinate 3. Continued bleeding from incision. 4. Increased pain, redness, or drainage from the incision. 5. Increasing abdominal pain  The clinic staff is available to answer your questions during regular business hours.  Please dont hesitate to call and ask to speak to one of the nurses for clinical concerns.  If you have a medical emergency, go to the nearest emergency room or call 911.  A surgeon from [facility] is always on call at the hospital. [address], [place], [place], [place]  [address] ? [id number],  [place], [place]   [address] [phone] ? [phone] ? FAX [phone] Web site:  [email or web address]

## 2016-10-09 ENCOUNTER — Ambulatory Visit: Admitting: Cardiovascular Disease

## 2016-12-22 ENCOUNTER — Telehealth: Payer: Self-pay | Admitting: Cardiovascular Disease

## 2016-12-22 ENCOUNTER — Encounter (HOSPITAL_COMMUNITY): Payer: Self-pay | Admitting: Emergency Medicine

## 2016-12-22 ENCOUNTER — Ambulatory Visit: Payer: Self-pay | Admitting: *Deleted

## 2016-12-22 ENCOUNTER — Other Ambulatory Visit: Payer: Self-pay

## 2016-12-22 ENCOUNTER — Emergency Department (HOSPITAL_COMMUNITY)
Admission: EM | Admit: 2016-12-22 | Discharge: 2016-12-22 | Disposition: A | Discharge status: Home / Self Care | Attending: Emergency Medicine | Admitting: Emergency Medicine

## 2016-12-22 DIAGNOSIS — I10 Essential (primary) hypertension: Secondary | ICD-10-CM

## 2016-12-22 DIAGNOSIS — Z5321 Procedure and treatment not carried out due to patient leaving prior to being seen by health care provider: Secondary | ICD-10-CM | POA: Insufficient documentation

## 2016-12-22 DIAGNOSIS — R51 Headache: Secondary | ICD-10-CM | POA: Diagnosis present

## 2016-12-22 LAB — CBC WITH DIFFERENTIAL/PLATELET
Basophils Absolute: 0 10*3/uL (ref 0.0–0.1)
Basophils Relative: 0 %
Eosinophils Absolute: 0.1 10*3/uL (ref 0.0–0.7)
Eosinophils Relative: 2 %
HCT: 42.2 % (ref 36.0–46.0)
Hemoglobin: 14.6 g/dL (ref 12.0–15.0)
Lymphocytes Relative: 53 %
Lymphs Abs: 2.9 10*3/uL (ref 0.7–4.0)
MCH: 33.3 pg (ref 26.0–34.0)
MCHC: 34.6 g/dL (ref 30.0–36.0)
MCV: 96.3 fL (ref 78.0–100.0)
Monocytes Absolute: 0.4 10*3/uL (ref 0.1–1.0)
Monocytes Relative: 8 %
Neutro Abs: 2 10*3/uL (ref 1.7–7.7)
Neutrophils Relative %: 37 %
Platelets: 300 10*3/uL (ref 150–400)
RBC: 4.38 MIL/uL (ref 3.87–5.11)
RDW: 13.1 % (ref 11.5–15.5)
WBC: 5.4 10*3/uL (ref 4.0–10.5)

## 2016-12-22 LAB — BASIC METABOLIC PANEL
Anion gap: 6 (ref 5–15)
BUN: 11 mg/dL (ref 6–20)
CO2: 25 mmol/L (ref 22–32)
Calcium: 9.1 mg/dL (ref 8.9–10.3)
Chloride: 107 mmol/L (ref 101–111)
Creatinine, Ser: 0.74 mg/dL (ref 0.44–1.00)
GFR calc Af Amer: >60 mL/min (ref >60)
GFR calc non Af Amer: >60 mL/min (ref >60)
Glucose, Bld: 88 mg/dL (ref 65–99)
Potassium: 3.9 mmol/L (ref 3.5–5.1)
Sodium: 138 mmol/L (ref 135–145)

## 2016-12-22 NOTE — ED Notes (Signed)
Spoke with Eulis Foster MD regarding pt status and complaint. Verbal orders placed.

## 2016-12-22 NOTE — Telephone Encounter (Signed)
Patient is seeing Dr. Jerline Pain 12/24/16.

## 2016-12-22 NOTE — Telephone Encounter (Signed)
F/U Call:  [facility] hospital staff states that patient BP is too high and patient is having some AFIB episodes.

## 2016-12-22 NOTE — ED Triage Notes (Addendum)
Pt complaint of ongoing right side headache with associated blurred vision for 3 weeks. No unilateral weakness. Pt verbalizes is compliant with blood pressure medication.

## 2016-12-22 NOTE — Telephone Encounter (Signed)
Patient currently admitted into [facility].

## 2016-12-22 NOTE — Telephone Encounter (Signed)
Patient states she has had a headache for 3 weeks. She states it goes away for a little while but comes back. Patient thought it was sinus related- but it is in her head.Right mostly-temple on back. Patient states her BP's have been pretty good. Patient states this is the worst headache she has ever had in her life- she is tired of hurting and it is not going away.  Reason for Disposition . [1] SEVERE headache (e.g., excruciating) AND [2] "worst headache" of life  Answer Assessment - Initial Assessment Questions 1. LOCATION: "Where does it hurt?"      Right side of head- temple on back- top of head 2. ONSET: "When did the headache start?" (Minutes, hours or days)      3 weeks- will go away for after Tylenol or hot pack eases. Comes back 3. PATTERN: "Does the pain come and go, or has it been constant since it started?"     constant 4. SEVERITY: "How bad is the pain?" and "What does it keep you from doing?"  (e.g., Scale 1-10; mild, moderate, or severe)   - MILD (1-3): doesn't interfere with normal activities    - MODERATE (4-7): interferes with normal activities or awakens from sleep    - SEVERE (8-10): excruciating pain, unable to do any normal activities        7- sleep- keeps her from doing things around the house 5. RECURRENT SYMPTOM: "Have you ever had headaches before?" If so, ask: "When was the last time?" and "What happened that time?"      When she was younger- she had migraines- patient had special medication 6. CAUSE: "What do you think is causing the headache?"     unknown 7. MIGRAINE: "Have you been diagnosed with migraine headaches?" If so, ask: "Is this headache similar?"      Childhood- can't remember 8. HEAD INJURY: "Has there been any recent injury to the head?"      no 9. OTHER SYMPTOMS: "Do you have any other symptoms?" (fever, stiff neck, eye pain, sore throat, cold symptoms)     Patient states she isn't able to remember some things 10. PREGNANCY: "Is there any chance  you are pregnant?" "When was your last menstrual period?"       n/a  Protocols used: HEADACHE-A-AH

## 2016-12-23 MED ORDER — SPIRONOLACTONE 25 MG PO TABS: 25.0000 mg | ORAL_TABLET | Freq: Every day | ORAL | 3 refills | Status: DC

## 2016-12-23 NOTE — Telephone Encounter (Signed)
Reviewed Dr. Elmarie Shiley advice with patient. He has reviewed her most recent bmet and is aware patient will be taking spironolactone in addition to olmesartan. I advised her to monitor headache and if it continues when BP improves, that she will need to seek further treatment and diagnosis of headache from her PCP.  I scheduled her for a nurse visit/BP check on 1/10. We will also get a bmet at that time. I advised her to call back with questions or concerns. I advised that Dr. Acie Fredrickson and I will be out of the office intermittently until after Christmas but to call our office and someone will take care of her issue. She verbalized understanding and agreement and thanked me for the call.

## 2016-12-23 NOTE — Telephone Encounter (Signed)
BP is elevated .   Lets DC HCTZ Start Spironolactone 25 mg a day Nurse visit and BMP in several weeks

## 2016-12-23 NOTE — Telephone Encounter (Signed)
Spoke with patient who states she has been having terrible headaches for the past several weeks. She states she went to the ED yesterday but waited for 5 hours without seeing a provider so she left. She states she was not aware of her BP being so high until she was seen in the ED. BP reading from her chart are: 209/94 mmHg, 216/93 mmHg, 180/94 mmHg. Patient states she feels like she needs to try a different BP medication. I asked the patient if she has ever taken aldactone in the past and she denies. I advised I will review with Dr. Acie Fredrickson and call her back with his advice. She verbalized understanding and agreement and thanked me for the call.

## 2016-12-24 ENCOUNTER — Ambulatory Visit: Admitting: Family Medicine

## 2017-01-15 ENCOUNTER — Other Ambulatory Visit: Admitting: *Deleted

## 2017-01-15 ENCOUNTER — Ambulatory Visit (INDEPENDENT_AMBULATORY_CARE_PROVIDER_SITE_OTHER): Admitting: Nurse Practitioner

## 2017-01-15 VITALS — BP 124/84 | HR 62 | Ht 65.0 in | Wt 230.2 lb

## 2017-01-15 DIAGNOSIS — I10 Essential (primary) hypertension: Secondary | ICD-10-CM

## 2017-01-15 MED ORDER — OLMESARTAN MEDOXOMIL 20 MG PO TABS: 20.0000 mg | ORAL_TABLET | Freq: Every day | ORAL | 0 refills | Status: DC

## 2017-01-15 NOTE — Progress Notes (Signed)
1.) Reason for visit: BP check  2.) Name of MD requesting visit: Dr. Acie Fredrickson  3.) H&P: hx essential hypertension, recent change in  medications by Dr. Acie Fredrickson - START Spironolactone 25 mg once daily in the place of HCTZ   4.) ROS related to problem: patient was seen in the ER on December 17 with very high BP and headache; patient states she discovered that her CPAP was causing sinus pain and she is working with her sleep doctor to correct this issue. She states she is feeling well today. She has joined [employer]  5.) Assessment and plan per MD: BP today is 124/84 mmHg; advised patient to continue current medications and I will follow-up with results of lab work after reviewed by Dr. Acie Fredrickson

## 2017-01-15 NOTE — Patient Instructions (Addendum)
Medication Instructions:  Your physician recommends that you continue on your current medications as directed. Please refer to the Current Medication list given to you today.   Labwork: Done Today   Testing/Procedures: None Ordered   Follow-Up: Your physician recommends that you return for a follow-up appointment in March with Dr. Acie Fredrickson   If you need a refill on your cardiac medications before your next appointment, please call your pharmacy.   Thank you for choosing [facility]! Christen Bame, RN [phone]

## 2017-01-16 LAB — BASIC METABOLIC PANEL
BUN/Creatinine Ratio: 9 (ref 9–23)
BUN: 6 mg/dL (ref 6–24)
CO2: 24 mmol/L (ref 20–29)
Calcium: 9.6 mg/dL (ref 8.7–10.2)
Chloride: 104 mmol/L (ref 96–106)
Creatinine, Ser: 0.66 mg/dL (ref 0.57–1.00)
GFR calc Af Amer: 112 mL/min/{1.73_m2} (ref >59)
GFR calc non Af Amer: 97 mL/min/{1.73_m2} (ref >59)
Glucose: 87 mg/dL (ref 65–99)
Potassium: 4.5 mmol/L (ref 3.5–5.2)
Sodium: 142 mmol/L (ref 134–144)

## 2017-01-17 ENCOUNTER — Encounter: Payer: Self-pay | Admitting: Cardiovascular Disease

## 2017-03-18 ENCOUNTER — Ambulatory Visit (INDEPENDENT_AMBULATORY_CARE_PROVIDER_SITE_OTHER): Admitting: Cardiovascular Disease

## 2017-03-18 ENCOUNTER — Encounter: Payer: Self-pay | Admitting: Cardiovascular Disease

## 2017-03-18 VITALS — BP 145/80 | HR 57 | Ht 65.0 in | Wt 226.1 lb

## 2017-03-18 DIAGNOSIS — I10 Essential (primary) hypertension: Secondary | ICD-10-CM

## 2017-03-18 DIAGNOSIS — I5032 Chronic diastolic (congestive) heart failure: Secondary | ICD-10-CM | POA: Diagnosis not present

## 2017-03-18 MED ORDER — SPIRONOLACTONE 25 MG PO TABS: 25.0000 mg | ORAL_TABLET | Freq: Every day | ORAL | 3 refills | Status: DC

## 2017-03-18 MED ORDER — PROPRANOLOL HCL 10 MG PO TABS: 10.0000 mg | ORAL_TABLET | Freq: Four times a day (QID) | ORAL | 6 refills | Status: DC | PRN

## 2017-03-18 MED ORDER — METOPROLOL TARTRATE 25 MG PO TABS: 25.0000 mg | ORAL_TABLET | Freq: Two times a day (BID) | ORAL | 3 refills | Status: DC

## 2017-03-18 MED ORDER — APIXABAN 5 MG PO TABS: 5.0000 mg | ORAL_TABLET | Freq: Two times a day (BID) | ORAL | 3 refills | Status: DC

## 2017-03-18 MED ORDER — OLMESARTAN MEDOXOMIL 20 MG PO TABS: 20.0000 mg | ORAL_TABLET | Freq: Every day | ORAL | 3 refills | Status: DC

## 2017-03-18 NOTE — Progress Notes (Signed)
Cardiology Office Note   Date:  03/18/2017   ID:  [name], [name] 1955-12-09, MRN [id number]  PCP:  Seward Carol, MD  Cardiologist:   Mertie Moores, MD   Chief Complaint  Patient presents with  . Atrial Fibrillation  . Hypertension   Problem List 1. Atrial fib - This patients CHA2DS2-VASc Score and unadjusted Ischemic Stroke Rate (% per year) is equal to 2.2 % stroke rate/year from a score of 2  Above score calculated as 1 point each if present [CHF, HTN, DM, Vascular=MI/PAD/Aortic Plaque, Age if 65-74, or Female] Above score calculated as 2 points each if present [Age > 75, or Stroke/TIA/TE]  2. Essential HTN 3. Grade 2 diastolic dysfunction  - normal LV function 4. Obstructive sleep apnea -   [name] is a 62 y.o. female who presents for atrial fib, She was seen in the ER with PAF Has had a dull head ache.   Echo shows normal LV systolic function Myoview shows no ischemia   July 20, 2015:  [name] is seen today for follow up visit for her paroxysmal atrial fib.  Has had 1 episode of palpitations - likely was PAF - related to stress.  Is watching the salt in her diet .  The Xarelto seems to be upsetting her stomach  Held it for several days and her stomach started bothering her again  March 06, 2016:  Doing well  Having some back pain  Had an episode of atrial fib 3 nights ago.   Work her up . Has OAS - uses CPAP but she had pulled it off. She put the CPAP mask back on   09/15/2016: [name] is seen back for follow-up evaluation. Wt is 228 ( down 9 lbs from last visit )  Has been having more palpitations.  Took some extra metoprolol which seems    Has not been using her CPAP because her mask has lots of condensation and drains back into there mask .  Has been tried on Lisinopril ( caused CP and cough) and was on Valsartan ( was recalled )  Doing well on the [facility]   March 18, 2017:   [name] is seen back today for follow-up of her chronic  diastolic congestive heart failure, paroxysmal atrial fibrillation Has had difficult losing weight Has night sweats - possbily due to menapause  BP is a little elevated.   Under lots of stress  - sister is in the hospital ( heavy smoker)  No dyspnea. Still has occasional palpitations - took an extra propranolol which helps   Past Medical History:  Diagnosis Date  . Allergic rhinitis 04/03/2009  . Anemia    hx  . Arthritis   . Asthma, mild, intermittent 01/23/2009   Qualifier: Diagnosis of  By: Jerold Coombe - denies  . Back pain 08/03/2012  . 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 Chronic diastolic CHF (congestive heart failure) ([place]) 07/20/2015  . Depression, recurrent ([place]) 03/03/2007   Qualifier: Diagnosis of  By: Nils Pyle CMA (AAMA), Mearl Latin  -denies  . Difficult intubation    04/09/04 with difficult intubation. unabale to pass ETT with Sabra Heck 2; unable to visualize cord with MAC 3; intubating LMA used; uneventful intubation using Glidescope 09/27/14  . Diverticulosis, with history of diverticulitis of descending and sigmoid colon 03/03/2007  . Dysrhythmia    atrial fib  . Essential hypertension 06/25/2006   Qualifier: Diagnosis of  By: Tiney Rouge CMA, Ellison Hughs    . Gastritis   .  GERD 03/03/2007   Qualifier: Diagnosis of  By: Nils Pyle CMA (AAMA), Mearl Latin   Qualifier: Diagnosis of  By: Shane Crutch, [name]   . Hepatic steatosis, found on CT 03/03/2007      . Hiatal hernia, small 03/28/2016   EGD by [facility] 12/09/12.   [name] History of colonic polyps 04/21/2008   Last Colonoscopy was 12/09/12, Dr. Fuller Plan. No polyp at that time. Repeat in 5 years.   . Internal hemorrhoids 03/03/2007   Last Colonoscopy was 12/09/12, Dr. Fuller Plan. Repeat in 5 years.   . Left adrenal mass ([place]) 05/12/2013  . Menopausal vasomotor syndrome 03/28/2016  . Morbid obesity ([place]) (BMI 35 plus 2 comorbidities) 03/06/2016   Comorbid conditions include HTN, HLD, hepatic steatosis, OA, GERD, OSA.  . OSA (obstructive sleep apnea)  03/03/2007   [place].   [name] PAF (paroxysmal atrial fibrillation) ([place]) 04/24/2015  . PONV (postoperative nausea and vomiting)    area on left forearm DO NOT PLACE IV OR STICK ? vascular"will bleed"  . Umbilical hernia without obstruction or gangrene, fat containing 03/28/2016   Found on CT, 2017. No issues.    Past Surgical History:  Procedure Laterality Date  . ABDOMINAL HYSTERECTOMY    . APPENDECTOMY    . BREAST REDUCTION SURGERY    . BUNIONECTOMY     bilateral  . CERVICAL FUSION    . CHOLECYSTECTOMY N/A 10/01/2016   Procedure: LAPAROSCOPIC CHOLECYSTECTOMY;  Surgeon: Coralie Keens, MD;  Location: [facility];  Service: General;  Laterality: N/A;  . MAXIMUM ACCESS (MAS)POSTERIOR LUMBAR INTERBODY FUSION (PLIF) 2 LEVEL N/A 09/27/2014   Procedure: Maximum Access Surgery Posterior Lumbar Interbody Fusion Lumbar three-four, Lumbar four-five;  Surgeon: Eustace Moore, MD;  Location: [facility];  Service: Neurosurgery;  Laterality: N/A;  . SPINE SURGERY    . TOTAL ABDOMINAL HYSTERECTOMY W/ BILATERAL SALPINGOOPHORECTOMY       Current Outpatient Medications  Medication Sig Dispense Refill  . Calcium Carbonate-Vitamin D (CALCIUM 600+D) 600-400 MG-UNIT per tablet Take 2 tablets by mouth daily.     . Carboxymethylcellulose Sodium (THERATEARS OP) Place 2 drops into both eyes as needed (for dry eyes).    . metoprolol tartrate (LOPRESSOR) 25 MG tablet Take 1 tablet (25 mg total) by mouth 2 (two) times daily. 180 tablet 3  . milk thistle 175 MG tablet Take 150 mg by mouth 2 (two) times daily.    [name] MINIVELLE 0.05 MG/24HR patch Place 1 patch onto the skin See admin instructions. Apply 1 patch to skin twice weekly on Wednesday and Sunday    . Multiple Vitamin (MULTIVITAMIN) tablet Take 1 tablet by mouth daily.    [name] olmesartan (BENICAR) 20 MG tablet Take 1 tablet (20 mg total) by mouth daily. 7 tablet 0  . apixaban (ELIQUIS) 5 MG TABS tablet Take 1 tablet (5 mg total) by mouth 2 (two) times daily.  180 tablet 3  . propranolol (INDERAL) 10 MG tablet Take 1 tablet (10 mg total) by mouth 4 (four) times daily as needed. 60 tablet 6  . spironolactone (ALDACTONE) 25 MG tablet Take 1 tablet (25 mg total) by mouth daily. 90 tablet 3   No current facility-administered medications for this visit.     Allergies:   Other; Losartan; and Tramadol    Social History:  The patient  reports that  has never smoked. she has never used smokeless tobacco. She reports that she does not drink alcohol or use drugs.   Family History:  The patient's family  history includes Coronary artery disease in her sister; Diabetes in her mother; Hypertension in her mother and sister.    ROS:  Please see the history of present illness.  [name]   Physical Exam: Blood pressure (!) 145/80, pulse (!) 57, height _0  (1.651 m), weight 226 lb 1.9 oz (102.6 kg), SpO2 97 %.  GEN:  Well nourished, well developed in no acute distress HEENT: Normal NECK: No JVD; No carotid bruits LYMPHATICS: No lymphadenopathy CARDIAC: RRR   RESPIRATORY:  Clear to auscultation without rales, wheezing or rhonchi  ABDOMEN: Soft, non-tender, non-distended MUSCULOSKELETAL:  No edema; No deformity  SKIN: Warm and dry NEUROLOGIC:  Alert and oriented x 3   EKG:  EKG is ordered today.  Sept. 10. 2018. Sinus brady at 54.   Mod. Voltage for LVH with associated ST abn.     Recent Labs: 04/22/2016: ALT 22; TSH 1.20 12/22/2016: Hemoglobin 14.6; Platelets 300 01/15/2017: BUN 6; Creatinine, Ser 0.66; Potassium 4.5; Sodium 142    Lipid Panel    Component Value Date/Time   CHOL 129 04/22/2016 1234   CHOL 126 03/06/2016 0819   TRIG 103.0 04/22/2016 1234   HDL 41.80 04/22/2016 1234   HDL 40 03/06/2016 0819   CHOLHDL 3 04/22/2016 1234   VLDL 20.6 04/22/2016 1234   LDLCALC 67 04/22/2016 1234   LDLCALC 62 03/06/2016 0819      Wt Readings from Last 3 Encounters:  03/18/17 226 lb 1.9 oz (102.6 kg)  01/15/17 230 lb 4 oz (104.4 kg)  10/01/16 230 lb  (104.3 kg)      Other studies Reviewed: Additional studies/ records that were reviewed today include: . Review of the above records demonstrates:    ASSESSMENT AND PLAN:  1.  Paroxysmal atrial fibrillation:    Jahni continues to have episodes of paroxysmal atrial fibrillation.    I've encouraged her to work on an exercise program. I'll see her in 6 month.  2.   Essential hypertension:  3.    Chronic diastolic congestive heart failure: Patient had an echocardiogram in March, 2017.  She has normal left ventricular systolic function with grade 2 diastolic dysfunction.  Continue metoprolol.  Continue aggressive blood pressure control.   Current medicines are reviewed at length with the patient today.  The patient does not have concerns regarding medicines.  Labs/ tests ordered today include:  No orders of the defined types were placed in this encounter.   Disposition:   FU with me in 6 months      Mertie Moores, MD  03/18/2017 10:36 AM    [address] [place], [place], [place]  [address] Phone: [phone]; Fax: [phone]

## 2017-03-18 NOTE — Patient Instructions (Signed)
Medication Instructions:  Your physician recommends that you continue on your current medications as directed. Please refer to the Current Medication list given to you today.   Labwork: None Ordered   Testing/Procedures: None Ordered   Follow-Up: Your physician wants you to follow-up in: 6 months with Nurse Practitioner or PA on Dr. Elmarie Shiley team. You will receive a reminder letter in the mail two months in advance. If you don't receive a letter, please call our office to schedule the follow-up appointment.   If you need a refill on your cardiac medications before your next appointment, please call your pharmacy.   Thank you for choosing [facility]! Christen Bame, RN [phone]

## 2017-03-20 ENCOUNTER — Other Ambulatory Visit: Payer: Self-pay | Admitting: *Deleted

## 2017-03-20 ENCOUNTER — Telehealth: Payer: Self-pay | Admitting: Cardiovascular Disease

## 2017-03-20 MED ORDER — SPIRONOLACTONE 25 MG PO TABS: 25.0000 mg | ORAL_TABLET | Freq: Every day | ORAL | 3 refills | Status: DC

## 2017-03-20 MED ORDER — APIXABAN 5 MG PO TABS: 5.0000 mg | ORAL_TABLET | Freq: Two times a day (BID) | ORAL | 3 refills | Status: DC

## 2017-03-20 NOTE — Telephone Encounter (Signed)
°*  STAT* If patient is at the pharmacy, call can be transferred to refill team.   1. Which medications need to be refilled? (please list name of each medication and dose if known) Eliquis 5 mg // spironolactone 25 mg   2. Which pharmacy/location (including street and city if local pharmacy) is medication to be sent to?[facility] - [place], [place] - [address]  3. Do they need a 30 day or 90 day supply? 90   Patient now uses [place]

## 2017-03-20 NOTE — Telephone Encounter (Signed)
Eliquis refill sent to [facility]. Will route to refill pool for spironolactone refill

## 2017-03-27 ENCOUNTER — Telehealth: Payer: Self-pay | Admitting: *Deleted

## 2017-03-27 NOTE — Telephone Encounter (Signed)
Lmtcb 3/22 md

## 2017-03-27 NOTE — Telephone Encounter (Signed)
Lissa Hoard, pharmacist at [facility], left a msg on the refill vm requesting a call back in regards to patient being on both metoprolol and propranolol. He would like to know if they are to be given co-conimately or if they need to d/c the propranolol. Saralyn Pilar requested a call back at [phone]. Thanks, MI

## 2017-03-30 NOTE — Telephone Encounter (Signed)
The patient takes Metoprolol on a routine basis and takes the propranolol on an as needd basis This is appropriate for this patient

## 2017-03-30 NOTE — Telephone Encounter (Signed)
Left detailed message on [facility] voice mail regarding appropriate therapy for patient to take propranolol PRN in addition to Metoprolol. I advised they call back to our office and ask for me with further questions

## 2017-04-06 ENCOUNTER — Emergency Department (HOSPITAL_BASED_OUTPATIENT_CLINIC_OR_DEPARTMENT_OTHER)
Admission: EM | Admit: 2017-04-06 | Discharge: 2017-04-06 | Disposition: A | Discharge status: Home / Self Care | Attending: Emergency Medicine | Admitting: Emergency Medicine

## 2017-04-06 ENCOUNTER — Emergency Department (HOSPITAL_BASED_OUTPATIENT_CLINIC_OR_DEPARTMENT_OTHER)

## 2017-04-06 ENCOUNTER — Other Ambulatory Visit: Payer: Self-pay

## 2017-04-06 ENCOUNTER — Encounter (HOSPITAL_BASED_OUTPATIENT_CLINIC_OR_DEPARTMENT_OTHER): Payer: Self-pay | Admitting: Emergency Medicine

## 2017-04-06 DIAGNOSIS — I5032 Chronic diastolic (congestive) heart failure: Secondary | ICD-10-CM | POA: Diagnosis not present

## 2017-04-06 DIAGNOSIS — R070 Pain in throat: Secondary | ICD-10-CM | POA: Diagnosis not present

## 2017-04-06 DIAGNOSIS — I110 Hypertensive heart disease with heart failure: Secondary | ICD-10-CM | POA: Insufficient documentation

## 2017-04-06 DIAGNOSIS — R6883 Chills (without fever): Secondary | ICD-10-CM | POA: Diagnosis not present

## 2017-04-06 DIAGNOSIS — R197 Diarrhea, unspecified: Secondary | ICD-10-CM | POA: Diagnosis not present

## 2017-04-06 DIAGNOSIS — R05 Cough: Secondary | ICD-10-CM

## 2017-04-06 DIAGNOSIS — J45909 Unspecified asthma, uncomplicated: Secondary | ICD-10-CM | POA: Diagnosis not present

## 2017-04-06 DIAGNOSIS — R6889 Other general symptoms and signs: Secondary | ICD-10-CM

## 2017-04-06 DIAGNOSIS — R059 Cough, unspecified: Secondary | ICD-10-CM

## 2017-04-06 DIAGNOSIS — J111 Influenza due to unidentified influenza virus with other respiratory manifestations: Secondary | ICD-10-CM | POA: Insufficient documentation

## 2017-04-06 MED ORDER — ALBUTEROL SULFATE HFA 108 (90 BASE) MCG/ACT IN AERS: 1.0000 | INHALATION_SPRAY | RESPIRATORY_TRACT | 0 refills | Status: DC | PRN

## 2017-04-06 MED ORDER — LOPERAMIDE HCL 2 MG PO CAPS: 2.0000 mg | ORAL_CAPSULE | Freq: Four times a day (QID) | ORAL | 0 refills | Status: DC | PRN

## 2017-04-06 MED ORDER — BENZONATATE 100 MG PO CAPS: 100.0000 mg | ORAL_CAPSULE | Freq: Three times a day (TID) | ORAL | 0 refills | Status: DC | PRN

## 2017-04-06 MED ORDER — FLUTICASONE PROPIONATE 50 MCG/ACT NA SUSP: 2.0000 | Freq: Every day | NASAL | 0 refills | Status: DC

## 2017-04-06 NOTE — Discharge Instructions (Signed)

## 2017-04-06 NOTE — ED Triage Notes (Signed)
Patient states that she has had a cough, chills, sore throat since THursday - reports that she is also having a lot of loose stools

## 2017-04-06 NOTE — ED Provider Notes (Signed)
Emergency Department Provider Note   I have reviewed the triage vital signs and the nursing notes.   HISTORY  Chief Complaint Cough   HPI [name] is a 62 y.o. female with PMH of mild intermittent asthma, CHF, PAF, HLD, HTN, and GERD presents to the emergency department for evaluation of cough, chills, sore throat, diarrhea.  Symptoms began 5 days prior and have been persistent.  She denies any known sick contacts.  No productive cough or hemoptysis.  Denies any associated nausea or vomiting.  She has been compliant with her medications including NOAC for PAF.  No blood or black in the bowel movements.  She feels associated generalized fatigue.  No radiation of symptoms. She has a PCP appointment scheduled for tomorrow.  Past Medical History:  Diagnosis Date  . Allergic rhinitis 04/03/2009  . Anemia    hx  . Arthritis   . Asthma, mild, intermittent 01/23/2009   Qualifier: Diagnosis of  By: Jerold Coombe - denies  . Back pain 08/03/2012  . 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 Chronic diastolic CHF (congestive heart failure) ([place]) 07/20/2015  . Depression, recurrent ([place]) 03/03/2007   Qualifier: Diagnosis of  By: Nils Pyle CMA (AAMA), Mearl Latin  -denies  . Difficult intubation    04/09/04 with difficult intubation. unabale to pass ETT with Sabra Heck 2; unable to visualize cord with MAC 3; intubating LMA used; uneventful intubation using Glidescope 09/27/14  . Diverticulosis, with history of diverticulitis of descending and sigmoid colon 03/03/2007  . Dysrhythmia    atrial fib  . Essential hypertension 06/25/2006   Qualifier: Diagnosis of  By: Tiney Rouge CMA, Ellison Hughs    . Gastritis   . GERD 03/03/2007   Qualifier: Diagnosis of  By: Nils Pyle CMA (AAMA), Mearl Latin   Qualifier: Diagnosis of  By: Shane Crutch, [name]   . Hepatic steatosis, found on CT 03/03/2007      . Hiatal hernia, small 03/28/2016   EGD by [facility] 12/09/12.   [name] History of colonic polyps 04/21/2008   Last Colonoscopy  was 12/09/12, Dr. Fuller Plan. No polyp at that time. Repeat in 5 years.   . Internal hemorrhoids 03/03/2007   Last Colonoscopy was 12/09/12, Dr. Fuller Plan. Repeat in 5 years.   . Left adrenal mass ([place]) 05/12/2013  . Menopausal vasomotor syndrome 03/28/2016  . Morbid obesity ([place]) (BMI 35 plus 2 comorbidities) 03/06/2016   Comorbid conditions include HTN, HLD, hepatic steatosis, OA, GERD, OSA.  . OSA (obstructive sleep apnea) 03/03/2007   [place].   [name] PAF (paroxysmal atrial fibrillation) ([place]) 04/24/2015  . PONV (postoperative nausea and vomiting)    area on left forearm DO NOT PLACE IV OR STICK ? vascular"will bleed"  . Umbilical hernia without obstruction or gangrene, fat containing 03/28/2016   Found on CT, 2017. No issues.    Patient Active Problem List   Diagnosis Date Noted  . Biliary dyskinesia 10/01/2016  . History of hysterectomy 03/28/2016  . History of appendectomy 03/28/2016  . Umbilical hernia without obstruction or gangrene, fat containing 03/28/2016  . History of fusion of cervical spine 03/28/2016  . Hiatal hernia, small 03/28/2016  . Menopausal vasomotor syndrome 03/28/2016  . Forearm mass, left 03/28/2016  . Morbid obesity ([place]) (BMI 35 plus 2 comorbidities) 03/06/2016  . Chronic diastolic CHF (congestive heart failure) ([place]) 07/20/2015  . PAF (paroxysmal atrial fibrillation) ([place]) 04/24/2015  . History of lumbar spinal fusion 09/27/2014  . 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 Left adrenal mass ([facility])  05/12/2013  . Back pain 08/03/2012  . Allergic rhinitis 04/03/2009  . Asthma, mild, intermittent 01/23/2009  . History of colonic polyps 04/21/2008  . Depression, recurrent ([place]) 03/03/2007  . Internal hemorrhoids 03/03/2007  . GERD 03/03/2007  . Diverticulosis, with history of diverticulitis of descending and sigmoid colon 03/03/2007  . Hepatic steatosis, found on CT 03/03/2007  . OSA (obstructive sleep apnea) 03/03/2007  . Essential hypertension  06/25/2006    Past Surgical History:  Procedure Laterality Date  . ABDOMINAL HYSTERECTOMY    . APPENDECTOMY    . BREAST REDUCTION SURGERY    . BUNIONECTOMY     bilateral  . CERVICAL FUSION    . CHOLECYSTECTOMY N/A 10/01/2016   Procedure: LAPAROSCOPIC CHOLECYSTECTOMY;  Surgeon: Coralie Keens, MD;  Location: [place];  Service: General;  Laterality: N/A;  . MAXIMUM ACCESS (MAS)POSTERIOR LUMBAR INTERBODY FUSION (PLIF) 2 LEVEL N/A 09/27/2014   Procedure: Maximum Access Surgery Posterior Lumbar Interbody Fusion Lumbar three-four, Lumbar four-five;  Surgeon: Eustace Moore, MD;  Location: [place] [facility];  Service: Neurosurgery;  Laterality: N/A;  . SPINE SURGERY    . TOTAL ABDOMINAL HYSTERECTOMY W/ BILATERAL SALPINGOOPHORECTOMY      Current Outpatient Rx  . Order #: [id number] Class: Print  . Order #: [id number] Class: Normal  . Order #: [id number] Class: Print  . Order #: [id number] Class: Historical Med  . Order #: [id number] Class: Historical Med  . Order #: [id number] Class: Print  . Order #: [id number] Class: Print  . Order #: [id number] Class: Normal  . Order #: [id number] Class: Historical Med  . Order #: [id number] Class: Historical Med  . Order #: [id number] Class: Historical Med  . Order #: [id number] Class: Normal  . Order #: [id number] Class: Normal  . Order #: [id number] Class: Normal    Allergies Other; Losartan; and Tramadol  Family History  Problem Relation Age of Onset  . Diabetes Mother   . Hypertension Mother   . Coronary artery disease Sister   . Hypertension Sister   . Colon cancer Neg Hx     Social History Social History   Tobacco Use  . Smoking status: Never Smoker  . Smokeless tobacco: Never Used  Substance Use Topics  . Alcohol use: No    Alcohol/week: 0.0 oz  . Drug use: No    Review of Systems  Constitutional: No fever. Positive chills.  Eyes: No visual changes. ENT: Positive sore throat. Cardiovascular: Denies chest pain. Respiratory: Denies shortness of breath.  Positive cough.  Gastrointestinal: No abdominal pain.  No nausea, no vomiting. Positive diarrhea.  No constipation. Genitourinary: Negative for dysuria. Musculoskeletal: Negative for back pain. Skin: Negative for rash. Neurological: Negative for headaches, focal weakness or numbness.  10-point ROS otherwise negative.  ____________________________________________   PHYSICAL EXAM:  VITAL SIGNS: ED Triage Vitals  Enc Vitals Group     BP 04/06/17 1552 (!) 197/97     Pulse Rate 04/06/17 1552 (!) 59     Resp 04/06/17 1552 20     Temp 04/06/17 1552 98.1 F (36.7 C)     Temp Source 04/06/17 1552 Oral     SpO2 04/06/17 1552 95 %     Weight 04/06/17 1553 226 lb (102.5 kg)     Height 04/06/17 1553 5' 5"  (1.651 m)     Pain Score 04/06/17 1553 7   Constitutional: Alert and oriented. Well appearing and in no acute distress. Eyes: Conjunctivae are normal.  Head: Atraumatic. Ears:  Healthy appearing ear canals and TMs bilaterally Nose: Positive congestion/rhinnorhea. Mouth/Throat: Mucous membranes are moist.  Oropharynx  non-erythematous. Neck: No stridor.  Cardiovascular: Normal rate, regular rhythm. Good peripheral circulation. Grossly normal heart sounds.   Respiratory: Normal respiratory effort.  No retractions. Lungs CTAB. Gastrointestinal: Soft and nontender. No distention.  Musculoskeletal: No lower extremity tenderness nor edema. No gross deformities of extremities. Neurologic:  Normal speech and language. No gross focal neurologic deficits are appreciated.  Skin:  Skin is warm, dry and intact. No rash noted.  ____________________________________________  RADIOLOGY  Dg Chest 2 View  Result Date: 04/06/2017 CLINICAL DATA:  Cough, chills, fever, and sore throat since March 28. History of asthma, CHF. Nonsmoker. EXAM: CHEST - 2 VIEW COMPARISON:  Chest x-ray of March 162017 FINDINGS: The lungs are adequately inflated. There is no focal infiltrate. There is no pleural effusion. The  heart and pulmonary vascularity are normal. The mediastinum is normal in width. The trachea is midline. There is mild multilevel degenerative disc disease of the thoracic spine. IMPRESSION: There is no pneumonia, CHF, nor other acute cardiopulmonary abnormality. Electronically Signed   By: David  Martinique M.D.   On: 04/06/2017 16:11    ____________________________________________   PROCEDURES  Procedure(s) performed:   Procedures  None ____________________________________________   INITIAL IMPRESSION / ASSESSMENT AND PLAN / ED COURSE  Pertinent labs & imaging results that were available during my care of the patient were reviewed by me and considered in my medical decision making (see chart for details).  Patient presents to the emergency department for evaluation of flulike symptoms with associated diarrhea.  There is no evidence on chest x-ray to suggest pneumonia.  Patient with normal oropharynx.  Very low suspicion for strep pharyngitis.  Patient is drinking fluids and keeping them down but having frequent diarrhea.  No recent antibiotic use.  Abdomen is completely soft and nontender.  No indication for IV fluids or labs at this time.  Plan for supportive care including albuterol, Tessalon, flonase, and immodium. Given multiple days of symptoms the patient is not a good candidate for Tamiflu.   At this time, I do not feel there is any life-threatening condition present. I have reviewed and discussed all results (EKG, imaging, lab, urine as appropriate), exam findings with patient. I have reviewed nursing notes and appropriate previous records.  I feel the patient is safe to be discharged home without further emergent workup. Discussed usual and customary return precautions. Patient and family (if present) verbalize understanding and are comfortable with this plan.  Patient will follow-up with their primary care provider. If they do not have a primary care provider, information for follow-up  has been provided to them. All questions have been answered.  ____________________________________________  FINAL CLINICAL IMPRESSION(S) / ED DIAGNOSES  Final diagnoses:  Flu-like symptoms  Cough  Diarrhea, unspecified type     NEW OUTPATIENT MEDICATIONS STARTED DURING THIS VISIT:  Discharge Medication List as of 04/06/2017  4:43 PM    START taking these medications   Details  albuterol (PROVENTIL HFA;VENTOLIN HFA) 108 (90 Base) MCG/ACT inhaler Inhale 1-2 puffs into the lungs every 4 (four) hours as needed for wheezing or shortness of breath., Starting Mon 04/06/2017, Print    benzonatate (TESSALON) 100 MG capsule Take 1 capsule (100 mg total) by mouth 3 (three) times daily as needed for cough., Starting Mon 04/06/2017, Print    fluticasone (FLONASE) 50 MCG/ACT nasal spray Place 2 sprays into both nostrils daily for 7 days., Starting Mon 04/06/2017, Until Mon 04/13/2017, Print    loperamide (IMODIUM) 2 MG capsule Take 1 capsule (2 mg total) by  mouth 4 (four) times daily as needed for diarrhea or loose stools., Starting Mon 04/06/2017, Print        Note:  This document was prepared using Dragon voice recognition software and may include unintentional dictation errors.  Nanda Quinton, MD Emergency Medicine    Long, Wonda Olds, MD 04/07/17 [phone]

## 2017-04-06 NOTE — ED Notes (Signed)
Pt verbalizes understanding of dc instructions of dc instructions and denies any further needs at this time

## 2017-04-07 ENCOUNTER — Ambulatory Visit: Admitting: Family Medicine

## 2017-05-13 ENCOUNTER — Encounter (HOSPITAL_BASED_OUTPATIENT_CLINIC_OR_DEPARTMENT_OTHER): Payer: Self-pay | Admitting: Emergency Medicine

## 2017-05-13 ENCOUNTER — Emergency Department (HOSPITAL_BASED_OUTPATIENT_CLINIC_OR_DEPARTMENT_OTHER)
Admission: EM | Admit: 2017-05-13 | Discharge: 2017-05-14 | Disposition: A | Discharge status: Home / Self Care | Attending: Emergency Medicine | Admitting: Emergency Medicine

## 2017-05-13 ENCOUNTER — Emergency Department (HOSPITAL_BASED_OUTPATIENT_CLINIC_OR_DEPARTMENT_OTHER)

## 2017-05-13 ENCOUNTER — Other Ambulatory Visit: Payer: Self-pay

## 2017-05-13 DIAGNOSIS — J4520 Mild intermittent asthma, uncomplicated: Secondary | ICD-10-CM | POA: Diagnosis not present

## 2017-05-13 DIAGNOSIS — I110 Hypertensive heart disease with heart failure: Secondary | ICD-10-CM | POA: Insufficient documentation

## 2017-05-13 DIAGNOSIS — Z79899 Other long term (current) drug therapy: Secondary | ICD-10-CM | POA: Diagnosis not present

## 2017-05-13 DIAGNOSIS — I160 Hypertensive urgency: Secondary | ICD-10-CM | POA: Insufficient documentation

## 2017-05-13 DIAGNOSIS — I5032 Chronic diastolic (congestive) heart failure: Secondary | ICD-10-CM | POA: Insufficient documentation

## 2017-05-13 DIAGNOSIS — R0789 Other chest pain: Secondary | ICD-10-CM | POA: Diagnosis not present

## 2017-05-13 DIAGNOSIS — Z7901 Long term (current) use of anticoagulants: Secondary | ICD-10-CM | POA: Diagnosis not present

## 2017-05-13 LAB — BASIC METABOLIC PANEL
Anion gap: 11 (ref 5–15)
BUN: 15 mg/dL (ref 6–20)
CO2: 22 mmol/L (ref 22–32)
Calcium: 9 mg/dL (ref 8.9–10.3)
Chloride: 105 mmol/L (ref 101–111)
Creatinine, Ser: 0.77 mg/dL (ref 0.44–1.00)
GFR calc Af Amer: >60 mL/min (ref >60)
GFR calc non Af Amer: >60 mL/min (ref >60)
Glucose, Bld: 99 mg/dL (ref 65–99)
Potassium: 3.8 mmol/L (ref 3.5–5.1)
Sodium: 138 mmol/L (ref 135–145)

## 2017-05-13 LAB — CBC
HCT: 43.2 % (ref 36.0–46.0)
Hemoglobin: 15.3 g/dL — ABNORMAL HIGH (ref 12.0–15.0)
MCH: 34.4 pg — ABNORMAL HIGH (ref 26.0–34.0)
MCHC: 35.4 g/dL (ref 30.0–36.0)
MCV: 97.1 fL (ref 78.0–100.0)
Platelets: 334 10*3/uL (ref 150–400)
RBC: 4.45 MIL/uL (ref 3.87–5.11)
RDW: 12.1 % (ref 11.5–15.5)
WBC: 6.4 10*3/uL (ref 4.0–10.5)

## 2017-05-13 LAB — TROPONIN I: Troponin I: <0.03 ng/mL (ref <0.03)

## 2017-05-13 MED ORDER — NITROGLYCERIN 2 % TD OINT
1.0000 [in_us] | TOPICAL_OINTMENT | Freq: Once | TRANSDERMAL | Status: AC
  Administered 2017-05-13: 1 [in_us] via TOPICAL
  Filled 2017-05-13: qty 1

## 2017-05-13 MED ORDER — ASPIRIN 81 MG PO CHEW
324.0000 mg | CHEWABLE_TABLET | Freq: Once | ORAL | Status: AC
  Administered 2017-05-13: 324 mg via ORAL
  Filled 2017-05-13: qty 4

## 2017-05-13 MED ORDER — NITROGLYCERIN 0.4 MG SL SUBL
0.4000 mg | SUBLINGUAL_TABLET | SUBLINGUAL | Status: DC | PRN
  Filled 2017-05-13: qty 1

## 2017-05-13 NOTE — ED Triage Notes (Signed)
Pt c/o mid CP since last pm; reports LUE pain that started around lunchtime today

## 2017-05-13 NOTE — ED Provider Notes (Signed)
[facility] EMERGENCY DEPARTMENT Provider Note   CSN: [id number] Arrival date & time: 05/13/17  1958     History   Chief Complaint Chief Complaint  Patient presents with  . Chest Pain    HPI [name] is a 62 y.o. female.  HPI  62 year-old female presents with arm pain and chest pain as well as hypertension.  Noticed arm pain around noon today while at work.  Feels like a throbbing and pulling in her arm.  It is her entire left arm.  Around 5:00 after checking her blood pressure she developed chest pressure.  Her blood pressure was 220/118.  She states a couple weeks ago when checked at the doctor's office was around 202 systolic.  She has not had any shortness of breath or diaphoresis.  She has noticed worsening symptoms when walking up or down stairs not when walking on flat ground.  No vomiting.  She has a little bit of left back pain that started with the arm pain but it is not severe.  There is no ripping or tearing.  The pain in her chest and her arm is 9/10 but the arm is a little bit worse.  She has chronic right foot swelling but no new swelling or recent travel or calf pain/swelling.  She is on Eliquis for A. fib  Past Medical History:  Diagnosis Date  . Allergic rhinitis 04/03/2009  . Anemia    hx  . Arthritis   . Asthma, mild, intermittent 01/23/2009   Qualifier: Diagnosis of  By: Jerold Coombe - denies  . Back pain 08/03/2012  . 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 Chronic diastolic CHF (congestive heart failure) ([place]) 07/20/2015  . Depression, recurrent ([place]) 03/03/2007   Qualifier: Diagnosis of  By: Nils Pyle CMA (AAMA), Mearl Latin  -denies  . Difficult intubation    04/09/04 with difficult intubation. unabale to pass ETT with Sabra Heck 2; unable to visualize cord with MAC 3; intubating LMA used; uneventful intubation using Glidescope 09/27/14  . Diverticulosis, with history of diverticulitis of descending and sigmoid colon 03/03/2007  . Dysrhythmia     atrial fib  . Essential hypertension 06/25/2006   Qualifier: Diagnosis of  By: Tiney Rouge CMA, Ellison Hughs    . Gastritis   . GERD 03/03/2007   Qualifier: Diagnosis of  By: Nils Pyle CMA (AAMA), Mearl Latin   Qualifier: Diagnosis of  By: Shane Crutch, [name]   . Hepatic steatosis, found on CT 03/03/2007      . Hiatal hernia, small 03/28/2016   EGD by [facility] 12/09/12.   [name] History of colonic polyps 04/21/2008   Last Colonoscopy was 12/09/12, Dr. Fuller Plan. No polyp at that time. Repeat in 5 years.   . Internal hemorrhoids 03/03/2007   Last Colonoscopy was 12/09/12, Dr. Fuller Plan. Repeat in 5 years.   . Left adrenal mass ([place]) 05/12/2013  . Menopausal vasomotor syndrome 03/28/2016  . Morbid obesity ([place]) (BMI 35 plus 2 comorbidities) 03/06/2016   Comorbid conditions include HTN, HLD, hepatic steatosis, OA, GERD, OSA.  . OSA (obstructive sleep apnea) 03/03/2007   [place].   [name] PAF (paroxysmal atrial fibrillation) ([place]) 04/24/2015  . PONV (postoperative nausea and vomiting)    area on left forearm DO NOT PLACE IV OR STICK ? vascular"will bleed"  . Umbilical hernia without obstruction or gangrene, fat containing 03/28/2016   Found on CT, 2017. No issues.    Patient Active Problem List   Diagnosis Date Noted  . Biliary dyskinesia  10/01/2016  . History of hysterectomy 03/28/2016  . History of appendectomy 03/28/2016  . Umbilical hernia without obstruction or gangrene, fat containing 03/28/2016  . History of fusion of cervical spine 03/28/2016  . Hiatal hernia, small 03/28/2016  . Menopausal vasomotor syndrome 03/28/2016  . Forearm mass, left 03/28/2016  . Morbid obesity ([place]) (BMI 35 plus 2 comorbidities) 03/06/2016  . Chronic diastolic CHF (congestive heart failure) ([place]) 07/20/2015  . PAF (paroxysmal atrial fibrillation) ([place]) 04/24/2015  . History of lumbar spinal fusion 09/27/2014  . 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 Left adrenal mass ([place]) 05/12/2013  . Back pain 08/03/2012  . Allergic  rhinitis 04/03/2009  . Asthma, mild, intermittent 01/23/2009  . History of colonic polyps 04/21/2008  . Depression, recurrent ([place]) 03/03/2007  . Internal hemorrhoids 03/03/2007  . GERD 03/03/2007  . Diverticulosis, with history of diverticulitis of descending and sigmoid colon 03/03/2007  . Hepatic steatosis, found on CT 03/03/2007  . OSA (obstructive sleep apnea) 03/03/2007  . Essential hypertension 06/25/2006    Past Surgical History:  Procedure Laterality Date  . ABDOMINAL HYSTERECTOMY    . APPENDECTOMY    . BREAST REDUCTION SURGERY    . BUNIONECTOMY     bilateral  . CERVICAL FUSION    . CHOLECYSTECTOMY N/A 10/01/2016   Procedure: LAPAROSCOPIC CHOLECYSTECTOMY;  Surgeon: Coralie Keens, MD;  Location: [place];  Service: General;  Laterality: N/A;  . MAXIMUM ACCESS (MAS)POSTERIOR LUMBAR INTERBODY FUSION (PLIF) 2 LEVEL N/A 09/27/2014   Procedure: Maximum Access Surgery Posterior Lumbar Interbody Fusion Lumbar three-four, Lumbar four-five;  Surgeon: Eustace Moore, MD;  Location: [place] [facility];  Service: Neurosurgery;  Laterality: N/A;  . SPINE SURGERY    . TOTAL ABDOMINAL HYSTERECTOMY W/ BILATERAL SALPINGOOPHORECTOMY       OB History   None      Home Medications    Prior to Admission medications   Medication Sig Start Date End Date Taking? Authorizing Provider  albuterol (PROVENTIL HFA;VENTOLIN HFA) 108 (90 Base) MCG/ACT inhaler Inhale 1-2 puffs into the lungs every 4 (four) hours as needed for wheezing or shortness of breath. 04/06/17   Long, Wonda Olds, MD  apixaban (ELIQUIS) 5 MG TABS tablet Take 1 tablet (5 mg total) by mouth 2 (two) times daily. 03/20/17   Nahser, Wonda Cheng, MD  benzonatate (TESSALON) 100 MG capsule Take 1 capsule (100 mg total) by mouth 3 (three) times daily as needed for cough. 04/06/17   Long, Wonda Olds, MD  Calcium Carbonate-Vitamin D (CALCIUM 600+D) 600-400 MG-UNIT per tablet Take 2 tablets by mouth daily.     [provider]  Carboxymethylcellulose  Sodium (THERATEARS OP) Place 2 drops into both eyes as needed (for dry eyes).    [provider]  fluticasone (FLONASE) 50 MCG/ACT nasal spray Place 2 sprays into both nostrils daily for 7 days. 04/06/17 04/13/17  Long, Wonda Olds, MD  loperamide (IMODIUM) 2 MG capsule Take 1 capsule (2 mg total) by mouth 4 (four) times daily as needed for diarrhea or loose stools. 04/06/17   Long, Wonda Olds, MD  metoprolol tartrate (LOPRESSOR) 25 MG tablet Take 1 tablet (25 mg total) by mouth 2 (two) times daily. 03/18/17   Nahser, Wonda Cheng, MD  milk thistle 175 MG tablet Take 150 mg by mouth 2 (two) times daily.    [provider]  MINIVELLE 0.05 MG/24HR patch Place 1 patch onto the skin See admin instructions. Apply 1 patch to skin twice weekly on Wednesday and Sunday 03/23/15  [provider]  Multiple Vitamin (MULTIVITAMIN) tablet Take 1 tablet by mouth daily.    [provider]  olmesartan (BENICAR) 20 MG tablet Take 1 tablet (20 mg total) by mouth daily. 03/18/17   Nahser, Wonda Cheng, MD  propranolol (INDERAL) 10 MG tablet Take 1 tablet (10 mg total) by mouth 4 (four) times daily as needed. 03/18/17   Nahser, Wonda Cheng, MD  spironolactone (ALDACTONE) 25 MG tablet Take 1 tablet (25 mg total) by mouth daily. 03/20/17   Nahser, Wonda Cheng, MD  beclomethasone (QVAR) 80 MCG/ACT inhaler Inhale 1 puff into the lungs as needed.    04/01/11  [provider]    Family History Family History  Problem Relation Age of Onset  . Diabetes Mother   . Hypertension Mother   . Coronary artery disease Sister   . Hypertension Sister   . Colon cancer Neg Hx     Social History Social History   Tobacco Use  . Smoking status: Never Smoker  . Smokeless tobacco: Never Used  Substance Use Topics  . Alcohol use: No    Alcohol/week: 0.0 oz  . Drug use: No     Allergies   Other; Losartan; and Tramadol   Review of Systems Review of Systems  Constitutional: Negative for diaphoresis.    Respiratory: Negative for shortness of breath.   Cardiovascular: Positive for chest pain and leg swelling (chronic, right foot).  Gastrointestinal: Negative for vomiting.  Musculoskeletal: Positive for back pain and myalgias.  All other systems reviewed and are negative.    Physical Exam Updated Vital Signs BP (!) 182/89   Pulse (!) 55   Temp 98 F (36.7 C) (Oral)   Resp 17   Ht _0  (1.651 m)   Wt 104.3 kg (230 lb)   SpO2 99%   BMI 38.27 kg/m   Physical Exam  Constitutional: She is oriented to person, place, and time. She appears well-developed and well-nourished.  Non-toxic appearance. She does not appear ill. No distress.  HENT:  Head: Normocephalic and atraumatic.  Right Ear: External ear normal.  Left Ear: External ear normal.  Nose: Nose normal.  Eyes: Right eye exhibits no discharge. Left eye exhibits no discharge.  Cardiovascular: Normal rate, regular rhythm and normal heart sounds.  Pulses:      Radial pulses are 2+ on the right side, and 2+ on the left side.  Pulmonary/Chest: Effort normal and breath sounds normal. She exhibits no tenderness.  Abdominal: Soft. There is no tenderness.  Musculoskeletal:  No calf swelling/tenderness Mild tenderness to left shoulder/upper arm. No decreased ROM or swelling  Neurological: She is alert and oriented to person, place, and time.  Skin: Skin is warm and dry.  Nursing note and vitals reviewed.    ED Treatments / Results  Labs (all labs ordered are listed, but only abnormal results are displayed) Labs Reviewed  CBC - Abnormal; Notable for the following components:      Result Value   Hemoglobin 15.3 (*)    MCH 34.4 (*)    All other components within normal limits  BASIC METABOLIC PANEL  TROPONIN I  TROPONIN I    EKG EKG Interpretation  Date/Time:  Wednesday May 13 2017 20:04:17 EDT Ventricular Rate:  64 PR Interval:  154 QRS Duration: 84 QT Interval:  406 QTC Calculation: 418 R Axis:   3 Text  Interpretation:  Normal sinus rhythm with sinus arrhythmia Nonspecific ST abnormality Abnormal ECG ST changes similar to Dec 2018 Confirmed  by Sherwood Gambler [id number]) on 05/13/2017 9:38:39 PM  EKG Interpretation  Date/Time:  Wednesday May 13 2017 23:41:07 EDT Ventricular Rate:  54 PR Interval:  154 QRS Duration: 100 QT Interval:  421 QTC Calculation: 399 R Axis:   -3 Text Interpretation:  Sinus rhythm Abnormal R-wave progression, early transition LVH with secondary repolarization abnormality ST changes similar to earlier in the day and to Dec 2018 Confirmed by Sherwood Gambler [phone]) on 05/13/2017 11:46:41 PM   Radiology Dg Chest 2 View  Result Date: 05/13/2017 CLINICAL DATA:  Chest pain with left arm pain 3 days. EXAM: CHEST - 2 VIEW COMPARISON:  04/06/2017 FINDINGS: Lungs are adequately inflated and otherwise clear. Cardiomediastinal silhouette is within normal. Remainder the exam is unchanged. IMPRESSION: No active cardiopulmonary disease. Electronically Signed   By: Marin Olp M.D.   On: 05/13/2017 21:03    Procedures Procedures (including critical care time)  Medications Ordered in ED Medications  nitroGLYCERIN (NITROSTAT) SL tablet 0.4 mg (has no administration in time range)  aspirin chewable tablet 324 mg (324 mg Oral Given 05/13/17 2258)  nitroGLYCERIN (NITROGLYN) 2 % ointment 1 inch (1 inch Topical Given 05/13/17 2304)     Initial Impression / Assessment and Plan / ED Course  I have reviewed the triage vital signs and the nursing notes.  Pertinent labs & imaging results that were available during my care of the patient were reviewed by me and considered in my medical decision making (see chart for details).     Patient's chest pain is atypical but she is noted to be pretty hypertensive here in the 180s.  Given this, she was given aspirin and was to be started on nitroglycerin.  However after aspirin she states her chest pain is completely resolved.  We discussed options and  through shared decision-making, she decided she would like to go home and follow-up with her cardiologist tomorrow.  I think this is reasonable given 2 troponins that are negative and ECGs that are unchanged from baseline and unchanged today.  I doubt PE or dissection.  She does have a little bit of back pain with hypertension but none of this pain is severe.  We discussed strict return precautions and I offered her admission for observation but at this point she would like to go home.  Final Clinical Impressions(s) / ED Diagnoses   Final diagnoses:  Atypical chest pain  Hypertensive urgency    ED Discharge Orders    None       Sherwood Gambler, MD 05/14/17 [phone]

## 2017-05-13 NOTE — ED Notes (Signed)
ED Provider at bedside. 

## 2017-05-13 NOTE — ED Notes (Signed)
Patient c/o mid chest pressure which started today; she reports she experienced left arm pain yesterday; denies any other sxs at present.

## 2017-05-14 ENCOUNTER — Ambulatory Visit (INDEPENDENT_AMBULATORY_CARE_PROVIDER_SITE_OTHER): Admitting: Cardiology

## 2017-05-14 ENCOUNTER — Encounter: Payer: Self-pay | Admitting: Cardiology

## 2017-05-14 ENCOUNTER — Telehealth: Payer: Self-pay | Admitting: Cardiovascular Disease

## 2017-05-14 VITALS — BP 180/100 | HR 64 | Ht 65.0 in | Wt 229.0 lb

## 2017-05-14 DIAGNOSIS — I480 Paroxysmal atrial fibrillation: Secondary | ICD-10-CM | POA: Diagnosis not present

## 2017-05-14 DIAGNOSIS — E669 Obesity, unspecified: Secondary | ICD-10-CM | POA: Diagnosis not present

## 2017-05-14 DIAGNOSIS — I10 Essential (primary) hypertension: Secondary | ICD-10-CM | POA: Diagnosis not present

## 2017-05-14 LAB — TROPONIN I: Troponin I: <0.03 ng/mL (ref <0.03)

## 2017-05-14 MED ORDER — AMLODIPINE BESYLATE 5 MG PO TABS: 5.0000 mg | ORAL_TABLET | Freq: Every day | ORAL | 3 refills | Status: DC

## 2017-05-14 NOTE — Telephone Encounter (Signed)
°  Pt c/o BP issue: STAT if pt c/o blurred vision, one-sided weakness or slurred speech  1. What are your last 5 BP readings? 198/108 2. Are you having any other symptoms (ex. Dizziness, headache, blurred vision, passed out)?  no  3. What is your BP issue?  Elevated bp

## 2017-05-14 NOTE — ED Notes (Signed)
ED Provider at bedside. 

## 2017-05-14 NOTE — Progress Notes (Signed)
Cardiology Office Note   Date:  05/14/2017   ID:  [name], DOB Jul 27, 1955, MRN [id number]  PCP:  Seward Carol, MD  Cardiologist:  Liam Rogers MD  Chief Complaint  Patient presents with  . Hypertension  . Chest Pain      History of Present Illness: [name] is a 62 y.o. female who is seen as a work in today for evaluation of HTN and chest pain. She is a patient of Dr. Acie Fredrickson. She has a history of Atrial fibrillation, HTN, diastolic dysfunction. She has OSA. Echo in 2017 showed grade 2 diastolic dysfunction. Otherwise normal. She also had a Myoview study in March 2017 that was normal. No history of CAD.  She was seen in the ED last night. Complained of left arm and chest pain. Felt throbbing. BP elevated at home to 220/118. In ED BP 182/89. Ecg was reported as unchanged. Troponin levels were normal. CBC and BMET were normal. She states her pain resolved with some nitrol paste in the ED and has not recurred. She states she has started on a new job and is stressed about this. Also had pork barbeque to eat this week and she never eats this.   Past Medical History:  Diagnosis Date  . Allergic rhinitis 04/03/2009  . Anemia    hx  . Arthritis   . Asthma, mild, intermittent 01/23/2009   Qualifier: Diagnosis of  By: Jerold Coombe - denies  . Back pain 08/03/2012  . 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 Chronic diastolic CHF (congestive heart failure) ([place]) 07/20/2015  . Depression, recurrent ([place]) 03/03/2007   Qualifier: Diagnosis of  By: Nils Pyle CMA (AAMA), Mearl Latin  -denies  . Difficult intubation    04/09/04 with difficult intubation. unabale to pass ETT with Sabra Heck 2; unable to visualize cord with MAC 3; intubating LMA used; uneventful intubation using Glidescope 09/27/14  . Diverticulosis, with history of diverticulitis of descending and sigmoid colon 03/03/2007  . Dysrhythmia    atrial fib  . Essential hypertension 06/25/2006   Qualifier: Diagnosis of  By:  Tiney Rouge CMA, Ellison Hughs    . Gastritis   . GERD 03/03/2007   Qualifier: Diagnosis of  By: Nils Pyle CMA (AAMA), Mearl Latin   Qualifier: Diagnosis of  By: Shane Crutch, [name]   . Hepatic steatosis, found on CT 03/03/2007      . Hiatal hernia, small 03/28/2016   E[facility] by [facility] 12/09/12.   [name] History of colonic polyps 04/21/2008   Last Colonoscopy was 12/09/12, Dr. Fuller Plan. No polyp at that time. Repeat in 5 years.   . Internal hemorrhoids 03/03/2007   Last Colonoscopy was 12/09/12, Dr. Fuller Plan. Repeat in 5 years.   . Left adrenal mass ([place]) 05/12/2013  . Menopausal vasomotor syndrome 03/28/2016  . Morbid obesity ([place]) (BMI 35 plus 2 comorbidities) 03/06/2016   Comorbid conditions include HTN, HLD, hepatic steatosis, OA, GERD, OSA.  . OSA (obstructive sleep apnea) 03/03/2007   [place].   [name] PAF (paroxysmal atrial fibrillation) ([place]) 04/24/2015  . PONV (postoperative nausea and vomiting)    area on left forearm DO NOT PLACE IV OR STICK ? vascular"will bleed"  . Umbilical hernia without obstruction or gangrene, fat containing 03/28/2016   Found on CT, 2017. No issues.    Past Surgical History:  Procedure Laterality Date  . ABDOMINAL HYSTERECTOMY    . APPENDECTOMY    . BREAST REDUCTION SURGERY    . BUNIONECTOMY     bilateral  .  CERVICAL FUSION    . CHOLECYSTECTOMY N/A 10/01/2016   Procedure: LAPAROSCOPIC CHOLECYSTECTOMY;  Surgeon: Coralie Keens, MD;  Location: [place];  Service: General;  Laterality: N/A;  . MAXIMUM ACCESS (MAS)POSTERIOR LUMBAR INTERBODY FUSION (PLIF) 2 LEVEL N/A 09/27/2014   Procedure: Maximum Access Surgery Posterior Lumbar Interbody Fusion Lumbar three-four, Lumbar four-five;  Surgeon: Eustace Moore, MD;  Location: [place] [facility];  Service: Neurosurgery;  Laterality: N/A;  . SPINE SURGERY    . TOTAL ABDOMINAL HYSTERECTOMY W/ BILATERAL SALPINGOOPHORECTOMY       Current Outpatient Medications  Medication Sig Dispense Refill  . apixaban (ELIQUIS) 5 MG TABS tablet Take 1 tablet (5 mg  total) by mouth 2 (two) times daily. 180 tablet 3  . Calcium Carbonate-Vitamin D (CALCIUM 600+D) 600-400 MG-UNIT per tablet Take 2 tablets by mouth daily.     . metoprolol tartrate (LOPRESSOR) 25 MG tablet Take 1 tablet (25 mg total) by mouth 2 (two) times daily. 180 tablet 3  . milk thistle 175 MG tablet Take 150 mg by mouth 2 (two) times daily.    [name] MINIVELLE 0.05 MG/24HR patch Place 1 patch onto the skin See admin instructions. Apply 1 patch to skin twice weekly on Wednesday and Sunday    . Multiple Vitamin (MULTIVITAMIN) tablet Take 1 tablet by mouth daily.    [name] olmesartan (BENICAR) 20 MG tablet Take 1 tablet (20 mg total) by mouth daily. 90 tablet 3  . propranolol (INDERAL) 10 MG tablet Take 1 tablet (10 mg total) by mouth 4 (four) times daily as needed. 60 tablet 6  . spironolactone (ALDACTONE) 25 MG tablet Take 1 tablet (25 mg total) by mouth daily. 90 tablet 3  . amLODipine (NORVASC) 5 MG tablet Take 1 tablet (5 mg total) by mouth daily. 90 tablet 3   No current facility-administered medications for this visit.     Allergies:   Other; Losartan; and Tramadol    Social History:  The patient  reports that she has never smoked. She has never used smokeless tobacco. She reports that she does not drink alcohol or use drugs.   Family History:  The patient's family history includes Coronary artery disease in her sister; Diabetes in her mother; Hypertension in her mother and sister.    ROS:  Please see the history of present illness.   Otherwise, review of systems are positive for OSA. Unable to tolerate CPAP. Is planning to be fitted for an oral appliance.    PHYSICAL EXAM: VS:  BP (!) 180/100   Pulse 64   Ht _0  (1.651 m)   Wt 229 lb (103.9 kg)   BMI 38.11 kg/m  , BMI Body mass index is 38.11 kg/m. GEN: Well nourished, obese, in no acute distress  HEENT: normal  Neck: no JVD, carotid bruits, or masses Cardiac: RRR; no murmurs, rubs, or gallops,no edema  Respiratory:  clear to  auscultation bilaterally, normal work of breathing GI: soft, nontender, nondistended, + BS MS: no deformity or atrophy  Skin: warm and dry, no rash Neuro:  Strength and sensation are intact Psych: euthymic mood, full affect   EKG:  EKG is not ordered today. The ekg ordered yesterday demonstrates NSR with LVH and repolarization abnormality. I have personally reviewed and interpreted this study.    Recent Labs: 05/13/2017: BUN 15; Creatinine, Ser 0.77; Hemoglobin 15.3; Platelets 334; Potassium 3.8; Sodium 138    Lipid Panel    Component Value Date/Time   CHOL 129 04/22/2016 1234   CHOL  126 03/06/2016 0819   TRIG 103.0 04/22/2016 1234   HDL 41.80 04/22/2016 1234   HDL 40 03/06/2016 0819   CHOLHDL 3 04/22/2016 1234   VLDL 20.6 04/22/2016 1234   LDLCALC 67 04/22/2016 1234   LDLCALC 62 03/06/2016 0819      Wt Readings from Last 3 Encounters:  05/14/17 229 lb (103.9 kg)  05/13/17 230 lb (104.3 kg)  04/06/17 226 lb (102.5 kg)      Other studies Reviewed: Additional studies/ records that were reviewed today include:  Myoview 04/06/15: Study Highlights    Nuclear stress EF: 61%.  There was no ST segment deviation noted during stress.  The study is normal.  This is a low risk study.  The left ventricular ejection fraction is normal (55-65%).   Normal pharmacologic nuclear stress test with no evidence of prior infarct or ischemia   Echo 04/06/15: Study Conclusions  - Left ventricle: The cavity size was normal. Systolic function was   normal. The estimated ejection fraction was in the range of 55%   to 60%. Wall motion was normal; there were no regional wall   motion abnormalities. Features are consistent with a pseudonormal   left ventricular filling pattern, with concomitant abnormal   relaxation and increased filling pressure (grade 2 diastolic   dysfunction). - Aortic valve: Trileaflet; normal thickness, mildly calcified   leaflets. - Mitral valve: There was  trivial regurgitation. - Tricuspid valve: There was trivial regurgitation.  Impressions:  - Normal LVF EF 55-60%, trivial TR, normal LA size, grade 2   diastolic dysfunction.   ASSESSMENT AND PLAN:  1.  Chest pain/left arm pain. Normal Myoview 2 years ago. No Ecg changes. troponins normal. Suspect more related to severe uncontrolled HTN. Will focus on BP control now. If symptoms recur may need ischemic evaluation. 2. Uncontrolled HTN. Etiology unclear. Did recently eat pork barbeque and has started a new job. Will add amlodipine 5 mg daily to her current regimen. Arrange follow up in 3-4 weeks. Reinforced sodium restriction.  3. Obesity with OSA.  4. Paroxysmal Afib. On Eliquis and metoprolol. Now in NSR.    Current medicines are reviewed at length with the patient today.  The patient does not have concerns regarding medicines.  The following changes have been made:  Add amlodipine 5 mg daily.  Labs/ tests ordered today include:  No orders of the defined types were placed in this encounter.    Disposition:   FU with Dr. Acie Fredrickson in 3 weeks  Signed, Jadah Bobak [place], MD  05/14/2017 2:21 PM    [facility] [address] [place], [place], [address] Phone [phone], Fax [phone]

## 2017-05-14 NOTE — ED Notes (Signed)
Nitro paste removed from left side of chest prior to discharge.

## 2017-05-14 NOTE — Telephone Encounter (Signed)
Returned call to Pt. Pt calling office concerned that her BP this morning is 198/108.  Pt went to ER yesterday for elevated BP and chest pain.  BP at ER yesterday 182/89.  Pt given ASA and nitro in ER yesterday and pain resolved.  Pt had negative troponin x 2 and it was decided patient ok to discharge home and follow up with cardiology. Today BP 198/108.  Pt denies any symptoms with elevated BP.  HR 74.  Pt states she took all her blood pressure medication at 7:00 am.  Advised Pt to take an extra metoprolol 25 mg.  Advised would call back to check in on patient and attempt to make appointment.  Spoke with Dr. Doug Sou nurse.  Ok to add Pt to schedule today for 2:00 pm.   Call returned to Pt.  Advised of appointment and directions.  Pt indicates understanding.

## 2017-05-14 NOTE — Patient Instructions (Signed)
We will add amlodipine 5 mg daily to your current therapy for blood pressure  We will have you follow up in 3-4 weeks with Dr. Acie Fredrickson.

## 2017-06-12 ENCOUNTER — Ambulatory Visit: Admitting: Cardiology

## 2017-06-17 ENCOUNTER — Encounter: Payer: Self-pay | Admitting: Cardiovascular Disease

## 2017-06-17 ENCOUNTER — Ambulatory Visit (INDEPENDENT_AMBULATORY_CARE_PROVIDER_SITE_OTHER): Admitting: Cardiovascular Disease

## 2017-06-17 VITALS — BP 150/96 | HR 81 | Ht 65.0 in | Wt 229.8 lb

## 2017-06-17 DIAGNOSIS — I480 Paroxysmal atrial fibrillation: Secondary | ICD-10-CM

## 2017-06-17 DIAGNOSIS — I10 Essential (primary) hypertension: Secondary | ICD-10-CM | POA: Diagnosis not present

## 2017-06-17 DIAGNOSIS — I5032 Chronic diastolic (congestive) heart failure: Secondary | ICD-10-CM

## 2017-06-17 MED ORDER — SPIRONOLACTONE 50 MG PO TABS: 50.0000 mg | ORAL_TABLET | Freq: Every day | ORAL | 3 refills | Status: DC

## 2017-06-17 NOTE — Patient Instructions (Addendum)
Medication Instructions:  Your physician has recommended you make the following change in your medication:   INCREASE Aldactone (Spironolactone) to 50 mg once daily   Labwork: Your physician recommends that you return for lab work in: 3 weeks on Tuesday July 2 - you do not have to fast   Testing/Procedures: None Ordered   Follow-Up: Your physician recommends that you schedule a follow-up appointment in: 3 weeks on Tuesday July 2 at 2 pm   Your physician recommends that you schedule a follow-up appointment in: 4 months with Dr. Acie Fredrickson   If you need a refill on your cardiac medications before your next appointment, please call your pharmacy.   Thank you for choosing [facility]! Christen Bame, RN [phone]

## 2017-06-17 NOTE — Progress Notes (Signed)
Cardiology Office Note   Date:  06/17/2017   ID:  [name], DOB 1955-12-22, MRN [id number]  PCP:  Seward Carol, MD  Cardiologist:   Mertie Moores, MD   Chief Complaint  Patient presents with  . Congestive Heart Failure  . Atrial Fibrillation   Problem List 1. Atrial fib - This patients CHA2DS2-VASc Score and unadjusted Ischemic Stroke Rate (% per year) is equal to 2.2 % stroke rate/year from a score of 2  Above score calculated as 1 point each if present [CHF, HTN, DM, Vascular=MI/PAD/Aortic Plaque, Age if 65-74, or Female] Above score calculated as 2 points each if present [Age > 75, or Stroke/TIA/TE]  2. Essential HTN 3. Grade 2 diastolic dysfunction  - normal LV function 4. Obstructive sleep apnea -   [name] is a 62 y.o. female who presents for atrial fib, She was seen in the ER with PAF Has had a dull head ache.   Echo shows normal LV systolic function Myoview shows no ischemia   July 20, 2015:  [name] is seen today for follow up visit for her paroxysmal atrial fib.  Has had 1 episode of palpitations - likely was PAF - related to stress.  Is watching the salt in her diet .  The Xarelto seems to be upsetting her stomach  Held it for several days and her stomach started bothering her again  March 06, 2016:  Doing well  Having some back pain  Had an episode of atrial fib 3 nights ago.   Work her up . Has OAS - uses CPAP but she had pulled it off. She put the CPAP mask back on   09/15/2016: [name] is seen back for follow-up evaluation. Wt is 228 ( down 9 lbs from last visit )  Has been having more palpitations.  Took some extra metoprolol which seems    Has not been using her CPAP because her mask has lots of condensation and drains back into there mask .  Has been tried on Lisinopril ( caused CP and cough) and was on Valsartan ( was recalled )  Doing well on the [facility]   March 18, 2017:   [name] is seen back today for follow-up of  her chronic diastolic congestive heart failure, paroxysmal atrial fibrillation Has had difficult losing weight Has night sweats - possbily due to menapause  BP is a little elevated.   Under lots of stress  - sister is in the hospital ( heavy smoker)  No dyspnea. Still has occasional palpitations - took an extra propranolol which helps   June 17, 2017:  [name] is seen back for an early work in visit Was seen by Dr. Martinique a month ago.  She was having episodes of chest pain and an elevated blood pressure. She had been seen in the emergency room complaining of chest pain and left arm pain.  Troponin levels were normal. Dr. Doug Sou opinion was that the chest pain was related to her elevated blood pressure.  She has had a normal Myoview study 2 years ago.  The plan is to repeat the Myoview study if she has recurrent symptoms.  BP is still  Moderately elevated  She has cut back on her salt.    Has seen a nutritionist.      Past Medical History:  Diagnosis Date  . Allergic rhinitis 04/03/2009  . Anemia    hx  . Arthritis   . Asthma, mild, intermittent 01/23/2009   Qualifier: Diagnosis  of  By: Jerold Coombe - denies  . Back pain 08/03/2012  . 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 Chronic diastolic CHF (congestive heart failure) ([place]) 07/20/2015  . Depression, recurrent ([place]) 03/03/2007   Qualifier: Diagnosis of  By: Nils Pyle CMA (AAMA), Mearl Latin  -denies  . Difficult intubation    04/09/04 with difficult intubation. unabale to pass ETT with Sabra Heck 2; unable to visualize cord with MAC 3; intubating LMA used; uneventful intubation using Glidescope 09/27/14  . Diverticulosis, with history of diverticulitis of descending and sigmoid colon 03/03/2007  . Dysrhythmia    atrial fib  . Essential hypertension 06/25/2006   Qualifier: Diagnosis of  By: Tiney Rouge CMA, Ellison Hughs    . Gastritis   . GERD 03/03/2007   Qualifier: Diagnosis of  By: Nils Pyle CMA (AAMA), Mearl Latin   Qualifier: Diagnosis of  By:  Shane Crutch, [name]   . Hepatic steatosis, found on CT 03/03/2007      . Hiatal hernia, small 03/28/2016   EGD by [name] 12/09/12.   [name] History of colonic polyps 04/21/2008   Last Colonoscopy was 12/09/12, Dr. Fuller Plan. No polyp at that time. Repeat in 5 years.   . Internal hemorrhoids 03/03/2007   Last Colonoscopy was 12/09/12, Dr. Fuller Plan. Repeat in 5 years.   . Left adrenal mass ([place]) 05/12/2013  . Menopausal vasomotor syndrome 03/28/2016  . Morbid obesity ([place]) (BMI 35 plus 2 comorbidities) 03/06/2016   Comorbid conditions include HTN, HLD, hepatic steatosis, OA, GERD, OSA.  . OSA (obstructive sleep apnea) 03/03/2007   [place].   [name] PAF (paroxysmal atrial fibrillation) ([place]) 04/24/2015  . PONV (postoperative nausea and vomiting)    area on left forearm DO NOT PLACE IV OR STICK ? vascular"will bleed"  . Umbilical hernia without obstruction or gangrene, fat containing 03/28/2016   Found on CT, 2017. No issues.    Past Surgical History:  Procedure Laterality Date  . ABDOMINAL HYSTERECTOMY    . APPENDECTOMY    . BREAST REDUCTION SURGERY    . BUNIONECTOMY     bilateral  . CERVICAL FUSION    . CHOLECYSTECTOMY N/A 10/01/2016   Procedure: LAPAROSCOPIC CHOLECYSTECTOMY;  Surgeon: Coralie Keens, MD;  Location: [facility];  Service: General;  Laterality: N/A;  . MAXIMUM ACCESS (MAS)POSTERIOR LUMBAR INTERBODY FUSION (PLIF) 2 LEVEL N/A 09/27/2014   Procedure: Maximum Access Surgery Posterior Lumbar Interbody Fusion Lumbar three-four, Lumbar four-five;  Surgeon: Eustace Moore, MD;  Location: [place] [facility]UR[facility];  Service: Neurosurgery;  Laterality: N/A;  . SPINE SURGERY    . TOTAL ABDOMINAL HYSTERECTOMY W/ BILATERAL SALPINGOOPHORECTOMY       Current Outpatient Medications  Medication Sig Dispense Refill  . amLODipine (NORVASC) 5 MG tablet Take 1 tablet (5 mg total) by mouth daily. 90 tablet 3  . apixaban (ELIQUIS) 5 MG TABS tablet Take 1 tablet (5 mg total) by mouth 2 (two) times daily. 180 tablet 3  .  Calcium Carbonate-Vitamin D (CALCIUM 600+D) 600-400 MG-UNIT per tablet Take 2 tablets by mouth daily.     . metoprolol tartrate (LOPRESSOR) 25 MG tablet Take 1 tablet (25 mg total) by mouth 2 (two) times daily. 180 tablet 3  . milk thistle 175 MG tablet Take 150 mg by mouth 2 (two) times daily.    [name] MINIVELLE 0.05 MG/24HR patch Place 1 patch onto the skin See admin instructions. Apply 1 patch to skin twice weekly on Wednesday and Sunday    . Multiple Vitamin (MULTIVITAMIN) tablet Take 1 tablet by  mouth daily.    [name] olmesartan (BENICAR) 20 MG tablet Take 1 tablet (20 mg total) by mouth daily. 90 tablet 3  . propranolol (INDERAL) 10 MG tablet Take 1 tablet (10 mg total) by mouth 4 (four) times daily as needed. 60 tablet 6  . spironolactone (ALDACTONE) 25 MG tablet Take 1 tablet (25 mg total) by mouth daily. 90 tablet 3   No current facility-administered medications for this visit.     Allergies:   Other; Losartan; and Tramadol    Social History:  The patient  reports that she has never smoked. She has never used smokeless tobacco. She reports that she does not drink alcohol or use drugs.   Family History:  The patient's family history includes Coronary artery disease in her sister; Diabetes in her mother; Hypertension in her mother and sister.    ROS:  Please see the history of present illness.   Physical Exam: Blood pressure (!) 150/96, pulse 81, height _0  (1.651 m), weight 229 lb 12.8 oz (104.2 kg), SpO2 96 %.  GEN:  Well nourished, well developed in no acute distress HEENT: Normal NECK: No JVD; No carotid bruits LYMPHATICS: No lymphadenopathy CARDIAC: RRR   RESPIRATORY:  Clear to auscultation without rales, wheezing or rhonchi  ABDOMEN: Soft, non-tender, non-distended MUSCULOSKELETAL:  No edema; No deformity  SKIN: Warm and dry NEUROLOGIC:  Alert and oriented x 3   EKG:    Recent Labs: 05/13/2017: BUN 15; Creatinine, Ser 0.77; Hemoglobin 15.3; Platelets 334; Potassium 3.8;  Sodium 138    Lipid Panel    Component Value Date/Time   CHOL 129 04/22/2016 1234   CHOL 126 03/06/2016 0819   TRIG 103.0 04/22/2016 1234   HDL 41.80 04/22/2016 1234   HDL 40 03/06/2016 0819   CHOLHDL 3 04/22/2016 1234   VLDL 20.6 04/22/2016 1234   LDLCALC 67 04/22/2016 1234   LDLCALC 62 03/06/2016 0819      Wt Readings from Last 3 Encounters:  06/17/17 229 lb 12.8 oz (104.2 kg)  05/14/17 229 lb (103.9 kg)  05/13/17 230 lb (104.3 kg)      Other studies Reviewed: Additional studies/ records that were reviewed today include: . Review of the above records demonstrates:    ASSESSMENT AND PLAN:  1.  Paroxysmal atrial fibrillation:   Occasional palpitations    2.   Essential hypertension:  BP is still elevated Increase Aldactone to 50 mg a day  Nurse visit in 3-4 weeks with BMP   Future med changes will include Changing metoprol to Coreg Increasing amlodpiine to 10 mg a day   She needs to work very hard on her diet, exercise, weight loss plan.  She has seen a nutritionist and is looking forward to getting some weight off.  3.    Chronic diastolic congestive heart failure:    Stable   4.  Chest pain: She has not had any recurrent episodes of chest discomfort. Continue to observe   Current medicines are reviewed at length with the patient today.  The patient does not have concerns regarding medicines.  Labs/ tests ordered today include:  No orders of the defined types were placed in this encounter.   Disposition:   FU with me in 6 months      Mertie Moores, MD  06/17/2017 10:58 AM    [place] [address], [place], [place]  [address] Phone: [phone]; Fax: [phone]

## 2017-07-07 ENCOUNTER — Other Ambulatory Visit: Admitting: *Deleted

## 2017-07-07 ENCOUNTER — Ambulatory Visit (INDEPENDENT_AMBULATORY_CARE_PROVIDER_SITE_OTHER): Admitting: Cardiovascular Disease

## 2017-07-07 VITALS — BP 132/76 | HR 51 | Resp 16 | Ht 65.0 in | Wt 225.4 lb

## 2017-07-07 DIAGNOSIS — I10 Essential (primary) hypertension: Secondary | ICD-10-CM

## 2017-07-07 DIAGNOSIS — I480 Paroxysmal atrial fibrillation: Secondary | ICD-10-CM

## 2017-07-07 DIAGNOSIS — I5032 Chronic diastolic (congestive) heart failure: Secondary | ICD-10-CM

## 2017-07-07 MED ORDER — METOPROLOL SUCCINATE ER 25 MG PO TB24: 25.0000 mg | ORAL_TABLET | Freq: Every day | ORAL | 11 refills | Status: DC

## 2017-07-07 NOTE — Patient Instructions (Signed)
Medication Instructions:  Your physician has recommended you make the following change in your medication:  START: Metoprolol (Toprol XL) 25 mg once a day  STOP: Olmesartan  STOP: Lopressor  Continue: Propanolol as needed   If you need a refill on your cardiac medications, please contact your pharmacy first.  Labwork: None ordered   Testing/Procedures: None ordered   Follow-Up: Your physician recommends that you schedule a follow-up appointment in: 4-6 weeks with Dr. Acie Fredrickson   Any Other Special Instructions Will Be Listed Below (If Applicable).   Thank you for choosing [facility]    [phone]  If you need a refill on your cardiac medications before your next appointment, please call your pharmacy.

## 2017-07-07 NOTE — Progress Notes (Signed)
1.) Reason for visit: BP check   2.) Name of MD requesting visit: Dr. Acie Fredrickson   3.) H&P: Pt has a h/o HTN, CHF, afib. She was recently in the Ed for chest pain on 05/13/17. She had a recent f/u appt with Dr. Acie Fredrickson on 06/17/17, who started her on spironolactone 50 mg once a day.   4.) ROS related to problem: She presents today with her spouse in NAD. She states she had an episode of dizziness this morning while getting dressed. She states she stop taking her Olmesartan about 2 weeks ago and she has been taking her propanolol 10 mg daily since she was last discharged from the ED on 05/13/17. She also had a BMET done today prior to visit.   5.) Assessment and plan per MD: pt's BP 132/76 HR 51, wt 225.4. Medications and vitals reviewed by Dr. Acie Fredrickson. Per Dr. Acie Fredrickson stop Lopressor and START Toprol XL 25 mg once a day and take Propanolol only as needed for palpitations and okay to d/c Olmesartan. Instructed pt to continue monitoring blood pressure and HR daily along with exercise and losing weight. She is to call office if HR maintains lower than 50 or if BP  Maintain greater than 140/90 and follow up with Dr. Acie Fredrickson in 4-6 weeks. She is in agreement with plan. Escorted pt to check out in NAD.

## 2017-07-08 LAB — BASIC METABOLIC PANEL
BUN/Creatinine Ratio: 13 (ref 12–28)
BUN: 12 mg/dL (ref 8–27)
CO2: 21 mmol/L (ref 20–29)
Calcium: 10.4 mg/dL — ABNORMAL HIGH (ref 8.7–10.3)
Chloride: 103 mmol/L (ref 96–106)
Creatinine, Ser: 0.89 mg/dL (ref 0.57–1.00)
GFR calc Af Amer: 81 mL/min/{1.73_m2} (ref >59)
GFR calc non Af Amer: 71 mL/min/{1.73_m2} (ref >59)
Glucose: 88 mg/dL (ref 65–99)
Potassium: 4.5 mmol/L (ref 3.5–5.2)
Sodium: 138 mmol/L (ref 134–144)

## 2017-08-03 ENCOUNTER — Encounter: Payer: Self-pay | Admitting: Cardiovascular Disease

## 2017-08-03 ENCOUNTER — Ambulatory Visit (INDEPENDENT_AMBULATORY_CARE_PROVIDER_SITE_OTHER): Admitting: Cardiovascular Disease

## 2017-08-03 VITALS — BP 130/76 | HR 65 | Ht 65.0 in | Wt 224.4 lb

## 2017-08-03 DIAGNOSIS — I5032 Chronic diastolic (congestive) heart failure: Secondary | ICD-10-CM

## 2017-08-03 DIAGNOSIS — I10 Essential (primary) hypertension: Secondary | ICD-10-CM

## 2017-08-03 MED ORDER — PROPRANOLOL HCL 10 MG PO TABS: 10.0000 mg | ORAL_TABLET | Freq: Four times a day (QID) | ORAL | 6 refills | Status: DC | PRN

## 2017-08-03 NOTE — Addendum Note (Signed)
Addended by: Emmaline Life on: 08/03/2017 04:14 PM   Modules accepted: Orders

## 2017-08-03 NOTE — Patient Instructions (Signed)
Medication Instructions:  Your physician recommends that you continue on your current medications as directed. Please refer to the Current Medication list given to you today.   Labwork: None Ordered   Testing/Procedures: None Ordered   Follow-Up: Your physician wants you to follow-up in: 6 months with Dr. Nahser.  You will receive a reminder letter in the mail two months in advance. If you don't receive a letter, please call our office to schedule the follow-up appointment.   If you need a refill on your cardiac medications before your next appointment, please call your pharmacy.   Thank you for choosing [facility]! Billyjoe Go, RN [phone]    

## 2017-08-03 NOTE — Progress Notes (Signed)
Cardiology Office Note   Date:  08/03/2017   ID:  [name], [name] 03-Mar-1955, MRN [id number]  PCP:  Patient, No Pcp Per  Cardiologist:   Mertie Moores, MD   Chief Complaint  Patient presents with  . Hypertension  . Congestive Heart Failure   Problem List 1. Atrial fib - This patients CHA2DS2-VASc Score and unadjusted Ischemic Stroke Rate (% per year) is equal to 2.2 % stroke rate/year from a score of 2  Above score calculated as 1 point each if present [CHF, HTN, DM, Vascular=MI/PAD/Aortic Plaque, Age if 65-74, or Female] Above score calculated as 2 points each if present [Age > 75, or Stroke/TIA/TE]  2. Essential HTN 3. Grade 2 diastolic dysfunction  - normal LV function 4. Obstructive sleep apnea -   [name] is a 62 y.o. female who presents for atrial fib, She was seen in the ER with PAF Has had a dull head ache.   Echo shows normal LV systolic function Myoview shows no ischemia   July 20, 2015:  [name] is seen today for follow up visit for her paroxysmal atrial fib.  Has had 1 episode of palpitations - likely was PAF - related to stress.  Is watching the salt in her diet .  The Xarelto seems to be upsetting her stomach  Held it for several days and her stomach started bothering her again  March 06, 2016:  Doing well  Having some back pain  Had an episode of atrial fib 3 nights ago.   Work her up . Has OAS - uses CPAP but she had pulled it off. She put the CPAP mask back on   09/15/2016: [name] is seen back for follow-up evaluation. Wt is 228 ( down 9 lbs from last visit )  Has been having more palpitations.  Took some extra metoprolol which seems    Has not been using her CPAP because her mask has lots of condensation and drains back into there mask .  Has been tried on Lisinopril ( caused CP and cough) and was on Valsartan ( was recalled )  Doing well on the [facility]   March 18, 2017:   [name] is seen back today for follow-up of her  chronic diastolic congestive heart failure, paroxysmal atrial fibrillation Has had difficult losing weight Has night sweats - possbily due to menapause  BP is a little elevated.   Under lots of stress  - sister is in the hospital ( heavy smoker)  No dyspnea. Still has occasional palpitations - took an extra propranolol which helps   June 17, 2017:  [name] is seen back for an early work in visit Was seen by Dr. Martinique a month ago.  She was having episodes of chest pain and an elevated blood pressure. She had been seen in the emergency room complaining of chest pain and left arm pain.  Troponin levels were normal. Dr. Doug Sou opinion was that the chest pain was related to her elevated blood pressure.  She has had a normal Myoview study 2 years ago.  The plan is to repeat the Myoview study if she has recurrent symptoms.  BP is still  Moderately elevated  She has cut back on her salt.    Has seen a nutritionist.      August 03, 2017: [name] is seen today for follow-up of her hypertension and chronic diastolic congestive heart failure. BP readings have been elevated.  Has been to the [facility]  she had some Hibiscus Tea yesterday  -  BP is normal today   Past Medical History:  Diagnosis Date  . Allergic rhinitis 04/03/2009  . Anemia    hx  . Arthritis   . Asthma, mild, intermittent 01/23/2009   Qualifier: Diagnosis of  By: Jerold Coombe - denies  . Back pain 08/03/2012  . 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 Chronic diastolic CHF (congestive heart failure) ([place]) 07/20/2015  . Depression, recurrent ([place]) 03/03/2007   Qualifier: Diagnosis of  By: Nils Pyle CMA (AAMA), Mearl Latin  -denies  . Difficult intubation    04/09/04 with difficult intubation. unabale to pass ETT with Sabra Heck 2; unable to visualize cord with MAC 3; intubating LMA used; uneventful intubation using Glidescope 09/27/14  . Diverticulosis, with history of diverticulitis of descending and sigmoid  colon 03/03/2007  . Dysrhythmia    atrial fib  . Essential hypertension 06/25/2006   Qualifier: Diagnosis of  By: Tiney Rouge CMA, Ellison Hughs    . Gastritis   . GERD 03/03/2007   Qualifier: Diagnosis of  By: Nils Pyle CMA (AAMA), Mearl Latin   Qualifier: Diagnosis of  By: Shane Crutch, [name]   . Hepatic steatosis, found on CT 03/03/2007      . Hiatal hernia, small 03/28/2016   EGD by [facility] 12/09/12.   [name] History of colonic polyps 04/21/2008   Last Colonoscopy was 12/09/12, Dr. Fuller Plan. No polyp at that time. Repeat in 5 years.   . Internal hemorrhoids 03/03/2007   Last Colonoscopy was 12/09/12, Dr. Fuller Plan. Repeat in 5 years.   . Left adrenal mass ([place]) 05/12/2013  . Menopausal vasomotor syndrome 03/28/2016  . Morbid obesity ([place]) (BMI 35 plus 2 comorbidities) 03/06/2016   Comorbid conditions include HTN, HLD, hepatic steatosis, OA, GERD, OSA.  . OSA (obstructive sleep apnea) 03/03/2007   [place].   [name] PAF (paroxysmal atrial fibrillation) ([place]) 04/24/2015  . PONV (postoperative nausea and vomiting)    area on left forearm DO NOT PLACE IV OR STICK ? vascular"will bleed"  . Umbilical hernia without obstruction or gangrene, fat containing 03/28/2016   Found on CT, 2017. No issues.    Past Surgical History:  Procedure Laterality Date  . ABDOMINAL HYSTERECTOMY    . APPENDECTOMY    . BREAST REDUCTION SURGERY    . BUNIONECTOMY     bilateral  . CERVICAL FUSION    . CHOLECYSTECTOMY N/A 10/01/2016   Procedure: LAPAROSCOPIC CHOLECYSTECTOMY;  Surgeon: Coralie Keens, MD;  Location: [place];  Service: General;  Laterality: N/A;  . MAXIMUM ACCESS (MAS)POSTERIOR LUMBAR INTERBODY FUSION (PLIF) 2 LEVEL N/A 09/27/2014   Procedure: Maximum Access Surgery Posterior Lumbar Interbody Fusion Lumbar three-four, Lumbar four-five;  Surgeon: Eustace Moore, MD;  Location: [place] [facility];  Service: Neurosurgery;  Laterality: N/A;  . SPINE SURGERY    . TOTAL ABDOMINAL HYSTERECTOMY W/ BILATERAL SALPINGOOPHORECTOMY       Current  Outpatient Medications  Medication Sig Dispense Refill  . amLODipine (NORVASC) 5 MG tablet Take 1 tablet (5 mg total) by mouth daily. 90 tablet 3  . apixaban (ELIQUIS) 5 MG TABS tablet Take 1 tablet (5 mg total) by mouth 2 (two) times daily. 180 tablet 3  . Calcium Carbonate-Vitamin D (CALCIUM 600+D) 600-400 MG-UNIT per tablet Take 2 tablets by mouth daily.     . metoprolol succinate (TOPROL-XL) 25 MG 24 hr tablet Take 1 tablet (25 mg total) by mouth daily. 30 tablet 11  . milk thistle 175 MG tablet Take 150 mg  by mouth 2 (two) times daily.    . Multiple Vitamin (MULTIVITAMIN) tablet Take 1 tablet by mouth daily.    . propranolol (INDERAL) 10 MG tablet Take 1 tablet (10 mg total) by mouth 4 (four) times daily as needed. 60 tablet 6  . spironolactone (ALDACTONE) 50 MG tablet Take 1 tablet (50 mg total) by mouth daily. 90 tablet 3   No current facility-administered medications for this visit.     Allergies:   Other; Losartan; and Tramadol    Social History:  The patient  reports that she has never smoked. She has never used smokeless tobacco. She reports that she does not drink alcohol or use drugs.   Family History:  The patient's family history includes Coronary artery disease in her sister; Diabetes in her mother; Hypertension in her mother and sister.    ROS:  Please see the history of present illness.   Physical Exam: Blood pressure 130/76, pulse 65, height 5' 5"  (1.651 m), weight 224 lb 6.4 oz (101.8 kg), SpO2 96 %.  GEN:  Well nourished, well developed in no acute distress HEENT: Normal NECK: No JVD; No carotid bruits LYMPHATICS: No lymphadenopathy CARDIAC: RRR RESPIRATORY:  Clear to auscultation without rales, wheezing or rhonchi  ABDOMEN: Soft, non-tender, non-distended MUSCULOSKELETAL:  No edema; No deformity  SKIN: Warm and dry NEUROLOGIC:  Alert and oriented x 3   EKG:    Recent Labs: 05/13/2017: Hemoglobin 15.3; Platelets 334 07/07/2017: BUN 12; Creatinine, Ser 0.89;  Potassium 4.5; Sodium 138    Lipid Panel    Component Value Date/Time   CHOL 129 04/22/2016 1234   CHOL 126 03/06/2016 0819   TRIG 103.0 04/22/2016 1234   HDL 41.80 04/22/2016 1234   HDL 40 03/06/2016 0819   CHOLHDL 3 04/22/2016 1234   VLDL 20.6 04/22/2016 1234   LDLCALC 67 04/22/2016 1234   LDLCALC 62 03/06/2016 0819      Wt Readings from Last 3 Encounters:  08/03/17 224 lb 6.4 oz (101.8 kg)  07/07/17 225 lb 6.4 oz (102.2 kg)  06/17/17 229 lb 12.8 oz (104.2 kg)      Other studies Reviewed: Additional studies/ records that were reviewed today include: . Review of the above records demonstrates:    ASSESSMENT AND PLAN:  1.  Paroxysmal atrial fibrillation:  Continue Eliquis.  Remains in NSR   2.   Essential hypertension:   BP is very well controlled today .   Continue meds  she had some Hibiscus Tea yesterday    3.    Chronic diastolic congestive heart failure:    Stable   4.  Chest pain: She has not had any recurrent episodes of chest discomfort. Continue to observe   Current medicines are reviewed at length with the patient today.  The patient does not have concerns regarding medicines.  Labs/ tests ordered today include:  No orders of the defined types were placed in this encounter.   Disposition:   FU with me in 6 months      Mertie Moores, MD  08/03/2017 3:43 PM    [address], [place], [place]  [address] Phone: [phone]; Fax: [phone]

## 2017-09-04 ENCOUNTER — Emergency Department (HOSPITAL_COMMUNITY)

## 2017-09-04 ENCOUNTER — Emergency Department (HOSPITAL_COMMUNITY)
Admission: EM | Admit: 2017-09-04 | Discharge: 2017-09-04 | Disposition: A | Discharge status: Home / Self Care | Attending: Emergency Medicine | Admitting: Emergency Medicine

## 2017-09-04 ENCOUNTER — Telehealth: Payer: Self-pay | Admitting: Cardiovascular Disease

## 2017-09-04 ENCOUNTER — Other Ambulatory Visit: Payer: Self-pay

## 2017-09-04 DIAGNOSIS — Z7901 Long term (current) use of anticoagulants: Secondary | ICD-10-CM | POA: Diagnosis not present

## 2017-09-04 DIAGNOSIS — R0602 Shortness of breath: Secondary | ICD-10-CM

## 2017-09-04 DIAGNOSIS — I480 Paroxysmal atrial fibrillation: Secondary | ICD-10-CM | POA: Insufficient documentation

## 2017-09-04 DIAGNOSIS — Z79899 Other long term (current) drug therapy: Secondary | ICD-10-CM | POA: Insufficient documentation

## 2017-09-04 DIAGNOSIS — I5032 Chronic diastolic (congestive) heart failure: Secondary | ICD-10-CM | POA: Diagnosis not present

## 2017-09-04 DIAGNOSIS — J4520 Mild intermittent asthma, uncomplicated: Secondary | ICD-10-CM | POA: Insufficient documentation

## 2017-09-04 DIAGNOSIS — R0789 Other chest pain: Secondary | ICD-10-CM | POA: Diagnosis present

## 2017-09-04 DIAGNOSIS — I110 Hypertensive heart disease with heart failure: Secondary | ICD-10-CM | POA: Diagnosis not present

## 2017-09-04 LAB — URINALYSIS, ROUTINE W REFLEX MICROSCOPIC
Bilirubin Urine: NEGATIVE
Glucose, UA: NEGATIVE mg/dL
Ketones, ur: NEGATIVE mg/dL
Leukocytes, UA: NEGATIVE
Nitrite: NEGATIVE
Protein, ur: NEGATIVE mg/dL
Specific Gravity, Urine: 1.003 — ABNORMAL LOW (ref 1.005–1.030)
pH: 7 (ref 5.0–8.0)

## 2017-09-04 LAB — CBC
HCT: 49.5 % — ABNORMAL HIGH (ref 36.0–46.0)
Hemoglobin: 16.4 g/dL — ABNORMAL HIGH (ref 12.0–15.0)
MCH: 33.1 pg (ref 26.0–34.0)
MCHC: 33.1 g/dL (ref 30.0–36.0)
MCV: 100 fL (ref 78.0–100.0)
Platelets: 421 10*3/uL — ABNORMAL HIGH (ref 150–400)
RBC: 4.95 MIL/uL (ref 3.87–5.11)
RDW: 12.5 % (ref 11.5–15.5)
WBC: 5.5 10*3/uL (ref 4.0–10.5)

## 2017-09-04 LAB — BASIC METABOLIC PANEL
Anion gap: 9 (ref 5–15)
BUN: 9 mg/dL (ref 6–20)
CO2: 25 mmol/L (ref 22–32)
Calcium: 10 mg/dL (ref 8.9–10.3)
Chloride: 105 mmol/L (ref 98–111)
Creatinine, Ser: 0.81 mg/dL (ref 0.44–1.00)
GFR calc Af Amer: >60 mL/min (ref >60)
GFR calc non Af Amer: >60 mL/min (ref >60)
Glucose, Bld: 103 mg/dL — ABNORMAL HIGH (ref 70–99)
Potassium: 4.5 mmol/L (ref 3.5–5.1)
Sodium: 139 mmol/L (ref 135–145)

## 2017-09-04 MED ORDER — ETOMIDATE 2 MG/ML IV SOLN: 0.2000 mg/kg | Freq: Once | INTRAVENOUS | Status: DC

## 2017-09-04 MED ORDER — APIXABAN 5 MG PO TABS
5.0000 mg | ORAL_TABLET | Freq: Once | ORAL | Status: AC
  Administered 2017-09-04: 5 mg via ORAL
  Filled 2017-09-04: qty 1

## 2017-09-04 MED ORDER — DILTIAZEM HCL 25 MG/5ML IV SOLN
10.0000 mg | Freq: Once | INTRAVENOUS | Status: AC
  Administered 2017-09-04: 10 mg via INTRAVENOUS
  Filled 2017-09-04: qty 5

## 2017-09-04 NOTE — ED Triage Notes (Signed)
Pt reports waking up this am and feeling like her she is in Afib. Pt reports hx of Afib. Pt reports SHOB and CP starting today. Pt reports burning sensation in her chest. Pt is dyspneic in triage.

## 2017-09-04 NOTE — ED Notes (Signed)
Converted into sinus brady.

## 2017-09-04 NOTE — ED Provider Notes (Signed)
[place] EMERGENCY DEPARTMENT Provider Note   CSN: [id number] Arrival date & time: 09/04/17  1630     History   Chief Complaint Chief Complaint  Patient presents with  . Shortness of Breath  . Chest Pain    HPI [name] is a 62 y.o. female.  The history is provided by the patient.  Shortness of Breath  This is a new problem. The average episode lasts 12 hours. The problem occurs continuously.The current episode started 12 to 24 hours ago. The problem has not changed since onset.Associated symptoms include chest pain. Pertinent negatives include no headaches, no cough and no sputum production. Precipitated by: palpitations and shortness of breath on awakening. Treatments tried: propranolol for paroxysmal AFib. The treatment provided no relief. Associated medical issues comments: Paroxysmal Atrial fibrillation.    Past Medical History:  Diagnosis Date  . Allergic rhinitis 04/03/2009  . Anemia    hx  . Arthritis   . Asthma, mild, intermittent 01/23/2009   Qualifier: Diagnosis of  By: Jerold Coombe - denies  . Back pain 08/03/2012  . 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 Chronic diastolic CHF (congestive heart failure) ([place]) 07/20/2015  . Depression, recurrent ([place]) 03/03/2007   Qualifier: Diagnosis of  By: Nils Pyle CMA (AAMA), Mearl Latin  -denies  . Difficult intubation    04/09/04 with difficult intubation. unabale to pass ETT with Sabra Heck 2; unable to visualize cord with MAC 3; intubating LMA used; uneventful intubation using Glidescope 09/27/14  . Diverticulosis, with history of diverticulitis of descending and sigmoid colon 03/03/2007  . Dysrhythmia    atrial fib  . Essential hypertension 06/25/2006   Qualifier: Diagnosis of  By: Tiney Rouge CMA, Ellison Hughs    . Gastritis   . GERD 03/03/2007   Qualifier: Diagnosis of  By: Nils Pyle CMA (AAMA), Mearl Latin   Qualifier: Diagnosis of  By: Shane Crutch, [name]   . Hepatic steatosis, found on CT 03/03/2007    . Hiatal hernia, small 03/28/2016   EGD by [facility] 12/09/12.   [name] History of colonic polyps 04/21/2008   Last Colonoscopy was 12/09/12, Dr. Fuller Plan. No polyp at that time. Repeat in 5 years.   . Internal hemorrhoids 03/03/2007   Last Colonoscopy was 12/09/12, Dr. Fuller Plan. Repeat in 5 years.   . Left adrenal mass ([place]) 05/12/2013  . Menopausal vasomotor syndrome 03/28/2016  . Morbid obesity ([place]) (BMI 35 plus 2 comorbidities) 03/06/2016   Comorbid conditions include HTN, HLD, hepatic steatosis, OA, GERD, OSA.  . OSA (obstructive sleep apnea) 03/03/2007   [place].   [name] PAF (paroxysmal atrial fibrillation) ([place]) 04/24/2015  . PONV (postoperative nausea and vomiting)    area on left forearm DO NOT PLACE IV OR STICK ? vascular"will bleed"  . Umbilical hernia without obstruction or gangrene, fat containing 03/28/2016   Found on CT, 2017. No issues.    Patient Active Problem List   Diagnosis Date Noted  . Biliary dyskinesia 10/01/2016  . History of hysterectomy 03/28/2016  . History of appendectomy 03/28/2016  . Umbilical hernia without obstruction or gangrene, fat containing 03/28/2016  . History of fusion of cervical spine 03/28/2016  . Hiatal hernia, small 03/28/2016  . Menopausal vasomotor syndrome 03/28/2016  . Forearm mass, left 03/28/2016  . Morbid obesity ([place]) (BMI 35 plus 2 comorbidities) 03/06/2016  . Chronic diastolic CHF (congestive heart failure) ([place]) 07/20/2015  . PAF (paroxysmal atrial fibrillation) ([place]) 04/24/2015  . History of lumbar spinal fusion 09/27/2014  . Central centrifugal scarring  alopecia 06/23/2013  . Left adrenal mass ([place]) 05/12/2013  . Back pain 08/03/2012  . Allergic rhinitis 04/03/2009  . Asthma, mild, intermittent 01/23/2009  . History of colonic polyps 04/21/2008  . Depression, recurrent ([place]) 03/03/2007  . Internal hemorrhoids 03/03/2007  . GERD 03/03/2007  . Diverticulosis, with history of diverticulitis of descending and sigmoid colon 03/03/2007  .  Hepatic steatosis, found on CT 03/03/2007  . OSA (obstructive sleep apnea) 03/03/2007  . Essential hypertension 06/25/2006    Past Surgical History:  Procedure Laterality Date  . ABDOMINAL HYSTERECTOMY    . APPENDECTOMY    . BREAST REDUCTION SURGERY    . BUNIONECTOMY     bilateral  . CERVICAL FUSION    . CHOLECYSTECTOMY N/A 10/01/2016   Procedure: LAPAROSCOPIC CHOLECYSTECTOMY;  Surgeon: Coralie Keens, MD;  Location: [place];  Service: General;  Laterality: N/A;  . MAXIMUM ACCESS (MAS)POSTERIOR LUMBAR INTERBODY FUSION (PLIF) 2 LEVEL N/A 09/27/2014   Procedure: Maximum Access Surgery Posterior Lumbar Interbody Fusion Lumbar three-four, Lumbar four-five;  Surgeon: Eustace Moore, MD;  Location: [place] [facility];  Service: Neurosurgery;  Laterality: N/A;  . SPINE SURGERY    . TOTAL ABDOMINAL HYSTERECTOMY W/ BILATERAL SALPINGOOPHORECTOMY       OB History   None      Home Medications    Prior to Admission medications   Medication Sig Start Date End Date Taking? Authorizing Provider  amLODipine (NORVASC) 5 MG tablet Take 1 tablet (5 mg total) by mouth daily. 05/14/17 05/09/18  Martinique, Peter M, MD  apixaban (ELIQUIS) 5 MG TABS tablet Take 1 tablet (5 mg total) by mouth 2 (two) times daily. 03/20/17   Nahser, Wonda Cheng, MD  Calcium Carbonate-Vitamin D (CALCIUM 600+D) 600-400 MG-UNIT per tablet Take 2 tablets by mouth daily.     [provider]  metoprolol succinate (TOPROL-XL) 25 MG 24 hr tablet Take 1 tablet (25 mg total) by mouth daily. 07/07/17   Sueanne Margarita, MD  milk thistle 175 MG tablet Take 150 mg by mouth 2 (two) times daily.    [provider]  Multiple Vitamin (MULTIVITAMIN) tablet Take 1 tablet by mouth daily.    [provider]  propranolol (INDERAL) 10 MG tablet Take 1 tablet (10 mg total) by mouth 4 (four) times daily as needed. 08/03/17   Nahser, Wonda Cheng, MD  spironolactone (ALDACTONE) 50 MG tablet Take 1 tablet (50 mg total) by mouth daily. 06/17/17    Nahser, Wonda Cheng, MD  beclomethasone (QVAR) 80 MCG/ACT inhaler Inhale 1 puff into the lungs as needed.    04/01/11  [provider]    Family History Family History  Problem Relation Age of Onset  . Diabetes Mother   . Hypertension Mother   . Coronary artery disease Sister   . Hypertension Sister   . Colon cancer Neg Hx     Social History Social History   Tobacco Use  . Smoking status: Never Smoker  . Smokeless tobacco: Never Used  Substance Use Topics  . Alcohol use: No  . Drug use: No     Allergies   Other; Losartan; and Tramadol   Review of Systems Review of Systems  Constitutional: Negative for chills.  HENT: Negative for congestion.   Eyes: Negative for pain.  Respiratory: Positive for shortness of breath. Negative for cough and sputum production.   Cardiovascular: Positive for chest pain.  Endocrine: Negative for polyuria.  Genitourinary: Positive for frequency and urgency. Negative for dysuria.  Musculoskeletal: Negative  for back pain.  Neurological: Negative for headaches.     Physical Exam Updated Vital Signs BP 133/77   Pulse 64   Temp (!) 97.4 F (36.3 C) (Oral)   Resp 13   Ht _0  (1.6 m)   Wt 95.3 kg   SpO2 96%   BMI 37.20 kg/m   Physical Exam  Constitutional: She appears well-developed and well-nourished. She does not appear ill. She appears distressed.  HENT:  Head: Normocephalic and atraumatic.  Eyes: Conjunctivae are normal.  Neck: Neck supple.  Cardiovascular: Normal rate. An irregularly irregular rhythm present.  No murmur heard. Pulmonary/Chest: Effort normal and breath sounds normal. No respiratory distress. She has no decreased breath sounds.  Abdominal: Soft. There is no tenderness.  Musculoskeletal: She exhibits no edema.  Neurological: She is alert.  Skin: Skin is warm and dry.  Psychiatric: She has a normal mood and affect.  Nursing note and vitals reviewed.    ED Treatments / Results  Labs (all labs  ordered are listed, but only abnormal results are displayed) Labs Reviewed  BASIC METABOLIC PANEL - Abnormal; Notable for the following components:      Result Value   Glucose, Bld 103 (*)    All other components within normal limits  CBC - Abnormal; Notable for the following components:   Hemoglobin 16.4 (*)    HCT 49.5 (*)    Platelets 421 (*)    All other components within normal limits  URINALYSIS, ROUTINE W REFLEX MICROSCOPIC - Abnormal; Notable for the following components:   Color, Urine STRAW (*)    Specific Gravity, Urine 1.003 (*)    Hgb urine dipstick SMALL (*)    Bacteria, UA RARE (*)    All other components within normal limits  I-STAT TROPONIN, ED    EKG EKG Interpretation  Date/Time:  Friday September 04 2017 16:38:04 EDT Ventricular Rate:  135 PR Interval:    QRS Duration: 84 QT Interval:  248 QTC Calculation: 372 R Axis:   -17 Text Interpretation:  Atrial flutter with 2:1 A-V conduction Left ventricular hypertrophy with repolarization abnormality Abnormal ECG changed from prior, no longer in sinus Confirmed by Jola Schmidt [phone]) on 09/04/2017 7:30:24 PM   Radiology Dg Chest 2 View  Result Date: 09/04/2017 CLINICAL DATA:  Initial evaluation for acute chest pain. EXAM: CHEST - 2 VIEW COMPARISON:  Prior radiograph from 05/13/2017. FINDINGS: The cardiac and mediastinal silhouettes are stable in size and contour, and remain within normal limits. The lungs are hypoinflated with secondary diffuse bronchovascular crowding. No airspace consolidation, pleural effusion, or pulmonary edema is identified. There is no pneumothorax. No acute osseous abnormality identified.  Cervical ACDF noted. IMPRESSION: Shallow lung inflation with secondary diffuse bronchovascular crowding. No other active cardiopulmonary disease. Electronically Signed   By: Jeannine Boga M.D.   On: 09/04/2017 17:58    Procedures Procedures (including critical care time)  Medications Ordered in  ED Medications  diltiazem (CARDIZEM) injection 10 mg (10 mg Intravenous Given 09/04/17 1913)  apixaban (ELIQUIS) tablet 5 mg (5 mg Oral Given 09/04/17 2239)     Initial Impression / Assessment and Plan / ED Course  I have reviewed the triage vital signs and the nursing notes.  Pertinent labs & imaging results that were available during my care of the patient were reviewed by me and considered in my medical decision making (see chart for details).     The patient is a 56yoF with PMH paroxysmal Afib on eliquis who presents with  palpitations and shortness of breath that she first noticed on awakening. She reports this feels like Afib and EKG confirms atrial fibrillation here. She called her cardiologist who instructed that she take an extra dose of propranolol. This did not help, so she was instructed to come to the emergency department. The patient was hemodynamically stable, breathing comfortably, but was found to be in rate controlled Afib. Per the patient, she is usually in sinus rhythm. She is unsure of the exact time of onset, but knows she was not in Afib before she went to bed last night. She was given 66m diltiazem and was still in Afib. Patient was consented to electrical cardioversion and as preparations were being made for this procedure, she spontaneously converted to sinus rhythm. The patient was observed for an hour and did not have recurrence of atrial fibrillation. No infections or electrolyte abnormalities were found on her evaluation.  The patient was instructed to follow up with her cardiologist. Patient given strict return precautions. Patient reports understanding and agreement with discharge instructions.  Patient care supervised by Dr. CVenora Maples  JIrven Baltimore MD  Final Clinical Impressions(s) / ED Diagnoses   Final diagnoses:  Paroxysmal atrial fibrillation ([facility]  Shortness of breath    ED Discharge Orders    None       PIrven Baltimore MD 09/05/17 1[place] MD 09/07/17 2309

## 2017-09-04 NOTE — Telephone Encounter (Signed)
Pt put through directly to me.  States she can tell she has been in Afib since 5:30A.  States she gets SOB everytime she tries to get up and move.  Audibly SOB on the phone.  Took Metoprolol Succ this AM.  Took PRN Propanolol 10mg  at 5:30A and 12P.  Feels like she is going to pass out.  No vitals available.  Denies missing any doses of Eliquis.  Spoke with Dr. Acie Fredrickson and he advised for pt to go to ER for possible DCCV.  Spoke with pt and made her aware of recommendation.  Advised to go to [facility].  Pt verbalized understanding and was in agreement with plan.

## 2017-09-04 NOTE — ED Notes (Signed)
Patient left at this time with all belongings. 

## 2017-09-04 NOTE — Telephone Encounter (Signed)
New message:      Patient c/o Palpitations:  High priority if patient c/o lightheadedness, shortness of breath, or chest pain  1) How long have you had palpitations/irregular HR/ Afib? Are you having the symptoms now? afib since about 5:30  2) Are you currently experiencing lightheadedness, SOB or CP? Sob  3) Do you have a history of afib (atrial fibrillation) or irregular heart rhythm? afib  4) Have you checked your BP or HR? (document readings if available): no  5) Are you experiencing any other symptoms? SOB present on the phone

## 2017-09-04 NOTE — ED Notes (Signed)
Pt reports improvement in shortness of breath since Cardizem administration, however states she continues to feel like she's in afib.

## 2017-09-04 NOTE — Telephone Encounter (Signed)
Patient has developed atrial fibrillation.  She is not tolerating it very well.  She has continued to take her anticoagulant.  I have advised her to go to the emergency room for cardioversion.  She is to hold off on eating and drinking for right now.

## 2017-09-09 ENCOUNTER — Encounter: Payer: Self-pay | Admitting: Cardiovascular Disease

## 2017-09-10 ENCOUNTER — Ambulatory Visit (INDEPENDENT_AMBULATORY_CARE_PROVIDER_SITE_OTHER): Admitting: Cardiovascular Disease

## 2017-09-10 ENCOUNTER — Encounter: Payer: Self-pay | Admitting: Cardiovascular Disease

## 2017-09-10 VITALS — BP 118/62 | HR 66 | Ht 63.0 in | Wt 225.8 lb

## 2017-09-10 DIAGNOSIS — E669 Obesity, unspecified: Secondary | ICD-10-CM

## 2017-09-10 DIAGNOSIS — G4733 Obstructive sleep apnea (adult) (pediatric): Secondary | ICD-10-CM | POA: Diagnosis not present

## 2017-09-10 DIAGNOSIS — I480 Paroxysmal atrial fibrillation: Secondary | ICD-10-CM

## 2017-09-10 MED ORDER — METOPROLOL SUCCINATE ER 50 MG PO TB24: 50.0000 mg | ORAL_TABLET | Freq: Every day | ORAL | 3 refills | Status: DC

## 2017-09-10 NOTE — Patient Instructions (Signed)
Medication Instructions:  Your physician has recommended you make the following change in your medication:   INCREASE Metoprolol (Toprol XL) to 50 mg once daily   Labwork: None Ordered   Testing/Procedures: None Ordered   Follow-Up: You have been referred to Weight Loss Management  Your physician recommends that you schedule a follow-up appointment in: 3 months with Dr. Acie Fredrickson   If you need a refill on your cardiac medications before your next appointment, please call your pharmacy.   Thank you for choosing [facility]! Christen Bame, RN [phone]

## 2017-09-10 NOTE — Progress Notes (Signed)
Cardiology Office Note   Date:  09/10/2017   ID:  [name], [name] 12-22-1955, MRN [id number]  PCP:  Patient, No Pcp Per  Cardiologist:   Mertie Moores, MD   Chief Complaint  Patient presents with  . Atrial Fibrillation   Problem List 1. Atrial fib - This patients CHA2DS2-VASc Score and unadjusted Ischemic Stroke Rate (% per year) is equal to 2.2 % stroke rate/year from a score of 2  Above score calculated as 1 point each if present [CHF, HTN, DM, Vascular=MI/PAD/Aortic Plaque, Age if 65-74, or Female] Above score calculated as 2 points each if present [Age > 75, or Stroke/TIA/TE]  2. Essential HTN 3. Grade 2 diastolic dysfunction  - normal LV function 4. Obstructive sleep apnea -   [name] is a 62 y.o. female who presents for atrial fib, She was seen in the ER with PAF Has had a dull head ache.   Echo shows normal LV systolic function Myoview shows no ischemia   July 20, 2015:  [name] is seen today for follow up visit for her paroxysmal atrial fib.  Has had 1 episode of palpitations - likely was PAF - related to stress.  Is watching the salt in her diet .  The Xarelto seems to be upsetting her stomach  Held it for several days and her stomach started bothering her again  March 06, 2016:  Doing well  Having some back pain  Had an episode of atrial fib 3 nights ago.   Work her up . Has OAS - uses CPAP but she had pulled it off. She put the CPAP mask back on   09/15/2016: [name] is seen back for follow-up evaluation. Wt is 228 ( down 9 lbs from last visit )  Has been having more palpitations.  Took some extra metoprolol which seems    Has not been using her CPAP because her mask has lots of condensation and drains back into there mask .  Has been tried on Lisinopril ( caused CP and cough) and was on Valsartan ( was recalled )  Doing well on the [facility]   March 18, 2017:   [name] is seen back today for follow-up of her chronic diastolic  congestive heart failure, paroxysmal atrial fibrillation Has had difficult losing weight Has night sweats - possbily due to menapause  BP is a little elevated.   Under lots of stress  - sister is in the hospital ( heavy smoker)  No dyspnea. Still has occasional palpitations - took an extra propranolol which helps   June 17, 2017:  [name] is seen back for an early work in visit Was seen by Dr. Martinique a month ago.  She was having episodes of chest pain and an elevated blood pressure. She had been seen in the emergency room complaining of chest pain and left arm pain.  Troponin levels were normal. Dr. Doug Sou opinion was that the chest pain was related to her elevated blood pressure.  She has had a normal Myoview study 2 years ago.  The plan is to repeat the Myoview study if she has recurrent symptoms.  BP is still  Moderately elevated  She has cut back on her salt.    Has seen a nutritionist.      August 03, 2017: [name] is seen today for follow-up of her hypertension and chronic diastolic congestive heart failure. BP readings have been elevated.  Has been to the [facility]   she had some  Hibiscus Tea yesterday  -  BP is normal today   Sept. 5, 2019: [name] is seen today for follow-up of her hypertension and chronic diastolic congestive heart failure.  She was recently seen in the emergency room for paroxysmal atrial fibrillation.  She has a history of obstructive sleep apnea but has not been wearing her CPAP mask.  Episode of A flutter  - woke her up at 5 am.   Sent to the ER  Aflutter resolved with IV cardiazem   Past Medical History:  Diagnosis Date  . Allergic rhinitis 04/03/2009  . Anemia    hx  . Arthritis   . Asthma, mild, intermittent 01/23/2009   Qualifier: Diagnosis of  By: Jerold Coombe - denies  . Back pain 08/03/2012  . 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 Chronic diastolic CHF (congestive heart failure) ([place]) 07/20/2015  . Depression,  recurrent ([place]) 03/03/2007   Qualifier: Diagnosis of  By: Nils Pyle CMA (AAMA), Mearl Latin  -denies  . Difficult intubation    04/09/04 with difficult intubation. unabale to pass ETT with Sabra Heck 2; unable to visualize cord with MAC 3; intubating LMA used; uneventful intubation using Glidescope 09/27/14  . Diverticulosis, with history of diverticulitis of descending and sigmoid colon 03/03/2007  . Dysrhythmia    atrial fib  . Essential hypertension 06/25/2006   Qualifier: Diagnosis of  By: Tiney Rouge CMA, Ellison Hughs    . Gastritis   . GERD 03/03/2007   Qualifier: Diagnosis of  By: Nils Pyle CMA (AAMA), Mearl Latin   Qualifier: Diagnosis of  By: Shane Crutch, [name]   . Hepatic steatosis, found on CT 03/03/2007      . Hiatal hernia, small 03/28/2016   [facility] 12/09/12.   [name] History of colonic polyps 04/21/2008   Last Colonoscopy was 12/09/12, Dr. Fuller Plan. No polyp at that time. Repeat in 5 years.   . Internal hemorrhoids 03/03/2007   Last Colonoscopy was 12/09/12, Dr. Fuller Plan. Repeat in 5 years.   . Left adrenal mass ([place]) 05/12/2013  . Menopausal vasomotor syndrome 03/28/2016  . Morbid obesity ([place]) (BMI 35 plus 2 comorbidities) 03/06/2016   Comorbid conditions include HTN, HLD, hepatic steatosis, OA, GERD, OSA.  . OSA (obstructive sleep apnea) 03/03/2007   [place].   [name] PAF (paroxysmal atrial fibrillation) ([place]) 04/24/2015  . PONV (postoperative nausea and vomiting)    area on left forearm DO NOT PLACE IV OR STICK ? vascular"will bleed"  . Umbilical hernia without obstruction or gangrene, fat containing 03/28/2016   Found on CT, 2017. No issues.    Past Surgical History:  Procedure Laterality Date  . ABDOMINAL HYSTERECTOMY    . APPENDECTOMY    . BREAST REDUCTION SURGERY    . BUNIONECTOMY     bilateral  . CERVICAL FUSION    . CHOLECYSTECTOMY N/A 10/01/2016   Procedure: LAPAROSCOPIC CHOLECYSTECTOMY;  Surgeon: Coralie Keens, MD;  Location: [place];  Service: General;  Laterality: N/A;  . MAXIMUM ACCESS  (MAS)POSTERIOR LUMBAR INTERBODY FUSION (PLIF) 2 LEVEL N/A 09/27/2014   Procedure: Maximum Access Surgery Posterior Lumbar Interbody Fusion Lumbar three-four, Lumbar four-five;  Surgeon: Eustace Moore, MD;  Location: [place] [facility];  Service: Neurosurgery;  Laterality: N/A;  . SPINE SURGERY    . TOTAL ABDOMINAL HYSTERECTOMY W/ BILATERAL SALPINGOOPHORECTOMY       Current Outpatient Medications  Medication Sig Dispense Refill  . amLODipine (NORVASC) 5 MG tablet Take 1 tablet (5 mg total) by mouth daily. 90 tablet 3  . apixaban (  ELIQUIS) 5 MG TABS tablet Take 1 tablet (5 mg total) by mouth 2 (two) times daily. 180 tablet 3  . Calcium Carbonate-Vitamin D (CALCIUM 600+D) 600-400 MG-UNIT per tablet Take 2 tablets by mouth daily.     . metoprolol succinate (TOPROL-XL) 25 MG 24 hr tablet Take 1 tablet (25 mg total) by mouth daily. 30 tablet 11  . milk thistle 175 MG tablet Take 150 mg by mouth 2 (two) times daily.    . Multiple Vitamin (MULTIVITAMIN) tablet Take 1 tablet by mouth daily.    . propranolol (INDERAL) 10 MG tablet Take 1 tablet (10 mg total) by mouth 4 (four) times daily as needed. 60 tablet 6  . spironolactone (ALDACTONE) 50 MG tablet Take 1 tablet (50 mg total) by mouth daily. 90 tablet 3   No current facility-administered medications for this visit.     Allergies:   Other; Losartan; and Tramadol    Social History:  The patient  reports that she has never smoked. She has never used smokeless tobacco. She reports that she does not drink alcohol or use drugs.   Family History:  The patient's family history includes Coronary artery disease in her sister; Diabetes in her mother; Hypertension in her mother and sister.    ROS:  Please see the history of present illness.    Physical Exam: Blood pressure 118/62, pulse 66, height _0  (1.6 m), weight 225 lb 12.8 oz (102.4 kg), SpO2 98 %.  GEN:  Morbidly obese , middle age female,  NAD  HEENT: Normal NECK: No JVD; No carotid  bruits LYMPHATICS: No lymphadenopathy CARDIAC: RR  RESPIRATORY:  Clear to auscultation without rales, wheezing or rhonchi  ABDOMEN: Soft, non-tender, non-distended MUSCULOSKELETAL:  No edema; No deformity  SKIN: Warm and dry NEUROLOGIC:  Alert and oriented x 3    EKG:    Recent Labs: 09/04/2017: BUN 9; Creatinine, Ser 0.81; Hemoglobin 16.4; Platelets 421; Potassium 4.5; Sodium 139    Lipid Panel    Component Value Date/Time   CHOL 129 04/22/2016 1234   CHOL 126 03/06/2016 0819   TRIG 103.0 04/22/2016 1234   HDL 41.80 04/22/2016 1234   HDL 40 03/06/2016 0819   CHOLHDL 3 04/22/2016 1234   VLDL 20.6 04/22/2016 1234   LDLCALC 67 04/22/2016 1234   LDLCALC 62 03/06/2016 0819      Wt Readings from Last 3 Encounters:  09/10/17 225 lb 12.8 oz (102.4 kg)  09/04/17 210 lb (95.3 kg)  08/03/17 224 lb 6.4 oz (101.8 kg)      Other studies Reviewed: Additional studies/ records that were reviewed today include: . Review of the above records demonstrates:    ASSESSMENT AND PLAN:  1.  Paroxysmal atrial fibrillation:   Has had several episodes of atrial fibrillation.  She was seen in the emergency room on August 30 with an episode of atrial flutter.  The episode resolved with IV Cardizem. She remains on Eliquis. She is on Toprol-XL 25 mg a day.  We will increase the Toprol to 50 mg a day.  Continue amlodipine.  We discussed the fact that this is likely related to the fact that she has not been wearing her CPAP mask.  She is committed to wearing her CPAP.  Also discussed the fact that she needs to work on a weight loss program.  This will be the best long-term solution.  We will refer her to the [facility] weight loss clinic.  2.   Essential hypertension:  BP is ok here.   Her BP cuff reads higher. She needs to buy a larger cuff - her current cuff is too small   3.    Chronic diastolic congestive heart failure:      4.  Chest pain:    Current medicines are reviewed at length  with the patient today.  The patient does not have concerns regarding medicines.  Labs/ tests ordered today include:  No orders of the defined types were placed in this encounter.   Disposition:   FU with me in 3 months       Mertie Moores, MD  09/10/2017 10:29 AM    [address], [place], [place]  [address] Phone: [phone]; Fax: [phone]

## 2017-09-18 ENCOUNTER — Encounter: Attending: Cardiovascular Disease | Admitting: Dietician

## 2017-09-18 ENCOUNTER — Encounter: Payer: Self-pay | Admitting: Dietician

## 2017-09-18 DIAGNOSIS — Z6837 Body mass index (BMI) 37.0-37.9, adult: Secondary | ICD-10-CM | POA: Diagnosis not present

## 2017-09-18 DIAGNOSIS — I480 Paroxysmal atrial fibrillation: Secondary | ICD-10-CM

## 2017-09-18 DIAGNOSIS — Z713 Dietary counseling and surveillance: Secondary | ICD-10-CM | POA: Insufficient documentation

## 2017-09-18 DIAGNOSIS — E669 Obesity, unspecified: Secondary | ICD-10-CM | POA: Diagnosis not present

## 2017-09-18 DIAGNOSIS — G4733 Obstructive sleep apnea (adult) (pediatric): Secondary | ICD-10-CM | POA: Diagnosis not present

## 2017-09-18 NOTE — Progress Notes (Signed)
  Medical Nutrition Therapy:  Appt start time: 1050 end time:  1150.   Assessment:  Primary concerns today: Patient is here alone.  She would like to lose weight.  She was referred for obesity, OSA and is now wearing C-pap but states that it is not fitting correctly.  She also has A-fib. Other history includes HTN and GERD.  Weight hx: 227 lbs today 225 lbs 09/20/17 180 lbs prior to mother passing 2008.  Patient was her caregiver and stopped caring for herself and then after her death she stopped caring and was eating to fill a void.  Then 2 years later her sister died.    She has lost 5 lbs here and there but regains it and overall weight has increased. She stress eats and can eat a bag of potatoes chips when stressed.  Patient lives with her husband.  She is currently not working.  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  They share shopping and patient cooks.  Her husband is retired.  Preferred Learning Style:   No preference indicated   Learning Readiness:   Ready  MEDICATIONS: see list   DIETARY INTAKE: Avoided foods include white potatoes, beef, soda, not a big sweet lover  24-hr recall:  B ( AM): yogurt, 1 cup coffee  Snk ( AM): almonds (unsalted)  L ( PM):  Loves [employer] (grilled chicken sandwich or salad) Snk ( PM): potato chips, nuts D ( PM): pizza OR seafood restaurant OR grilled [place], chicken, or salmon, vegetables or salad, brown rice Snk ( PM): apples or almonds or skinny cow popsicle Beverages: water, hibiscous tea- with 1 cup honey, decaf coffee rarely  Usual physical activity: Just joined [employer] but has no energy  Progress Towards Goal(s):  In progress.   Nutritional Diagnosis:  [place]-3.3 Overweight/obesity As related to multi cause.  As evidenced by BMI 37.    Intervention:  Nutrition counseling/education related to balanced meals.Discused plant focussed eating as patient was interested in this.  Encouraged her in continued lifestyle  change.  Resources: NutritionFacts.org   Hibiscus Tea  Erythritol\ Daniel Plan [facility] an active lifestyle.  Aim for 30 minutes most days. Walking or pool.  What things could you do instead of eating when you are stressed?  Walk  Listen to music  Moment to yourself  Watch a movie or read a book.  Volunteer   What is your reason to change?  Feel better- more energy  MINDFULNESS  Eat slowly  Stop when full  Continue the low sodium diet  Teaching Method Utilized:  Visual Auditory Hands on  Handouts given during visit include:  Plant based primer  Meal plan card  Snack list  Barriers to learning/adherence to lifestyle change: none  Demonstrated degree of understanding via:  Teach Back   Monitoring/Evaluation:  Dietary intake, exercise, and body weight in several month(s).

## 2017-09-18 NOTE — Patient Instructions (Addendum)
Resources: NutritionFacts.org   Hibiscus Tea  Erythritol\ Daniel Plan [facility] an active lifestyle.  Aim for 30 minutes most days. Walking or pool.  What things could you do instead of eating when you are stressed?  Walk  Listen to music  Moment to yourself  Watch a movie or read a book.  Volunteer   What is your reason to change?  Feel better- more energy  MINDFULNESS  Eat slowly  Stop when full  Continue the low sodium diet

## 2017-09-30 ENCOUNTER — Other Ambulatory Visit: Payer: Self-pay | Admitting: Obstetrics and Gynecology

## 2017-09-30 DIAGNOSIS — N644 Mastodynia: Secondary | ICD-10-CM

## 2017-10-01 ENCOUNTER — Telehealth: Payer: Self-pay | Admitting: Cardiovascular Disease

## 2017-10-01 MED ORDER — APIXABAN 5 MG PO TABS: 5.0000 mg | ORAL_TABLET | Freq: Two times a day (BID) | ORAL | 1 refills | Status: DC

## 2017-10-01 NOTE — Telephone Encounter (Signed)
New Message ° ° °Patient calling the office for samples of medication: ° ° °1.  What medication and dosage are you requesting samples for?apixaban (ELIQUIS) 5 MG TABS tablet  ° °2.  Are you currently out of this medication? Yes  ° ° ° °

## 2017-10-01 NOTE — Telephone Encounter (Signed)
Spoke with patient and she stated that her medication is being shipped but she will not receive it for ten days and she only has two days of medication remaining. I made her aware that I will place one box of samples at the front desk. She verbalized her understanding and appreciation.

## 2017-10-01 NOTE — Telephone Encounter (Addendum)
Samples addressed by refill dept. Also looks like pt has Pharmacist, community and could use the copay card, unless she still has her [facility] card in which case copay should be affordable.

## 2017-11-04 ENCOUNTER — Ambulatory Visit

## 2017-11-04 ENCOUNTER — Ambulatory Visit
Admission: RE | Admit: 2017-11-04 | Discharge: 2017-11-04 | Disposition: A | Discharge status: Home / Self Care | Source: Ambulatory Visit | Attending: Obstetrics and Gynecology | Admitting: Obstetrics and Gynecology

## 2017-11-04 DIAGNOSIS — N644 Mastodynia: Secondary | ICD-10-CM

## 2017-11-25 ENCOUNTER — Other Ambulatory Visit: Payer: Self-pay | Admitting: Gastroenterology

## 2017-12-10 IMAGING — US US EXTREM UP*L* LTD
2 series · 14 of 20 positions shown · non-contrast
Comparison: None

CLINICAL DATA: 60-year-old female with focal pain in the left
anterior forearm for 1 month.

EXAM:
ULTRASOUND LEFT UPPER EXTREMITY LIMITED
TECHNIQUE: Ultrasound examination of the upper extremity soft tissues was
performed in the area of clinical concern.

[Series 1: us extrem up*left* ltd · 1 of 165 frames shown (1 of 2)]
[frame 83/165]
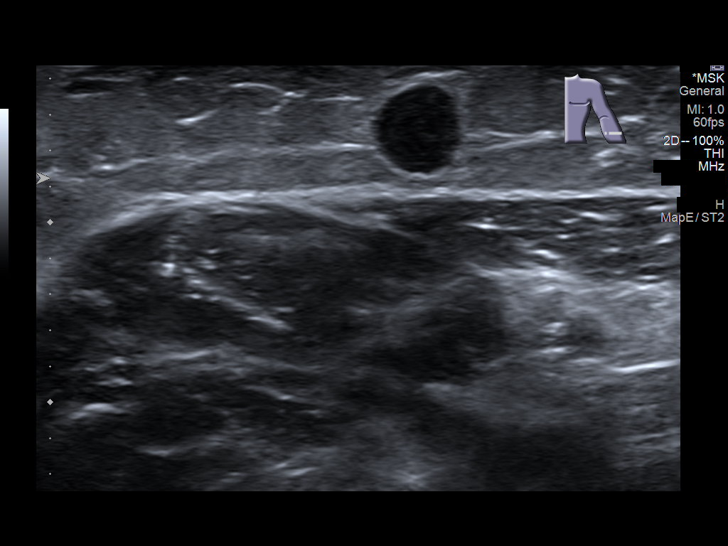

[Series 2: us extrem up*left* ltd · 0.04mm/px · 13 of 19 slices shown (2 of 2)]
[im 2/19]
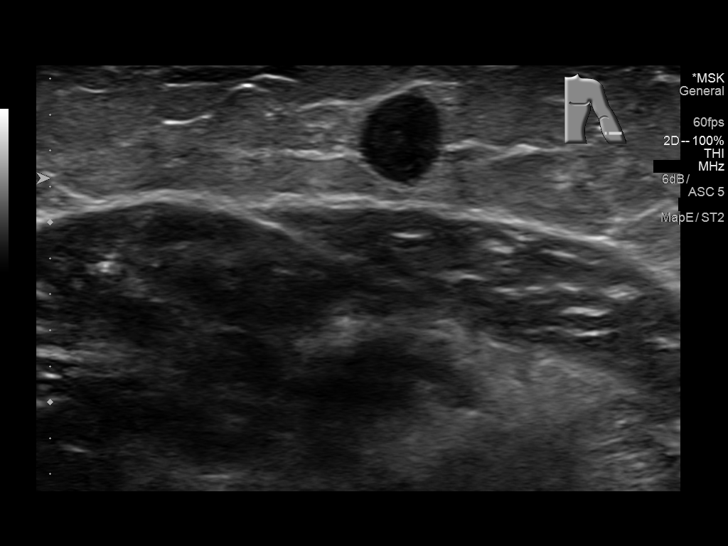
[im 3/19]
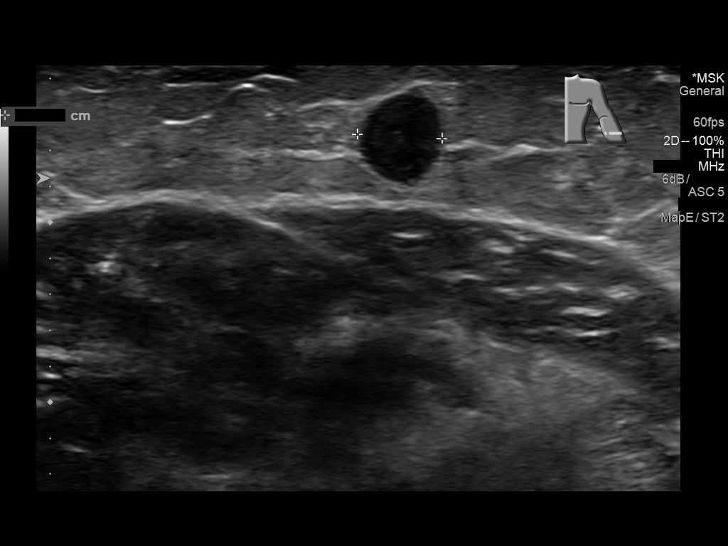
[im 5/19]
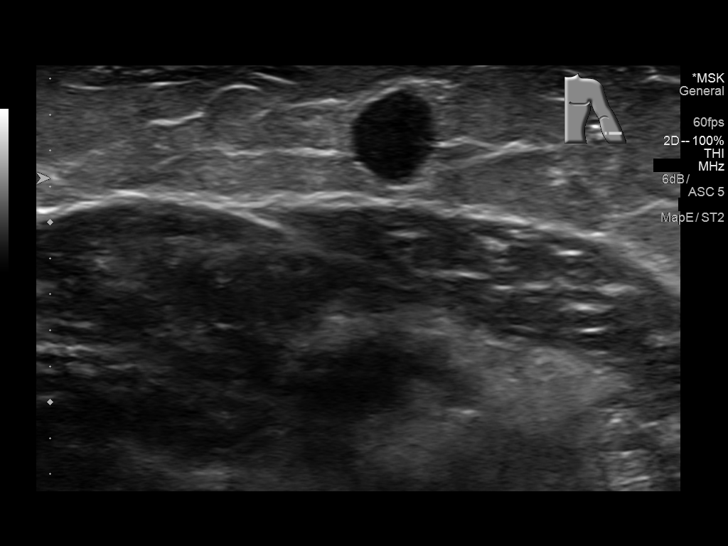
[im 6/19]
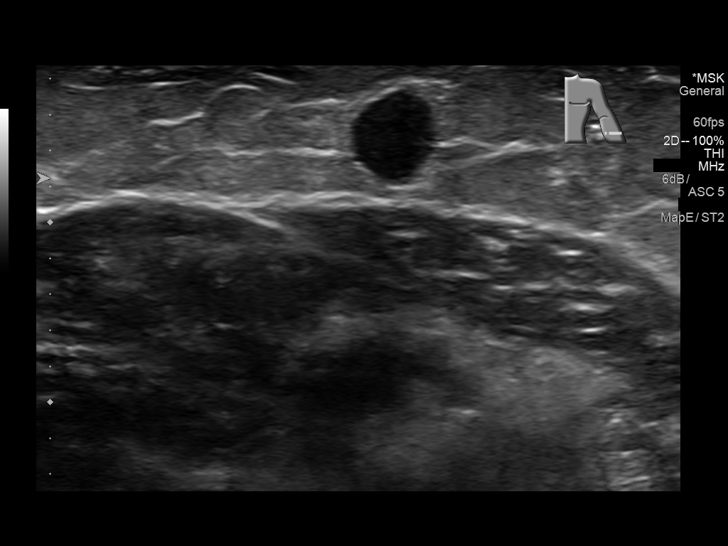
[im 7/19]
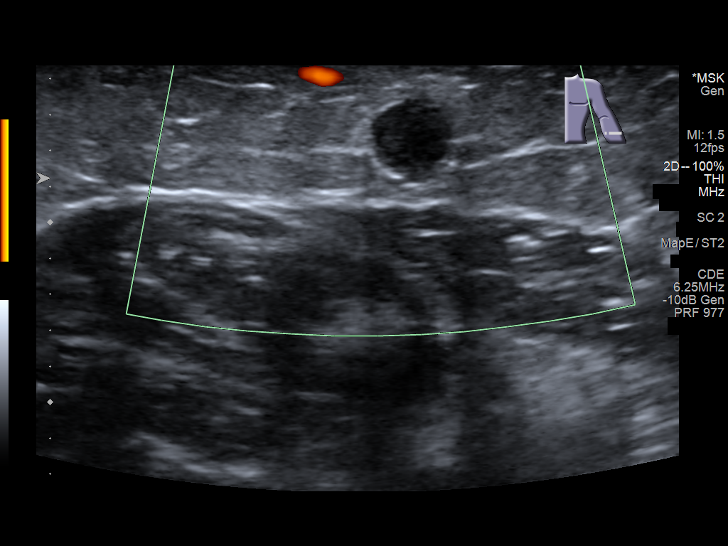
[im 9/19]
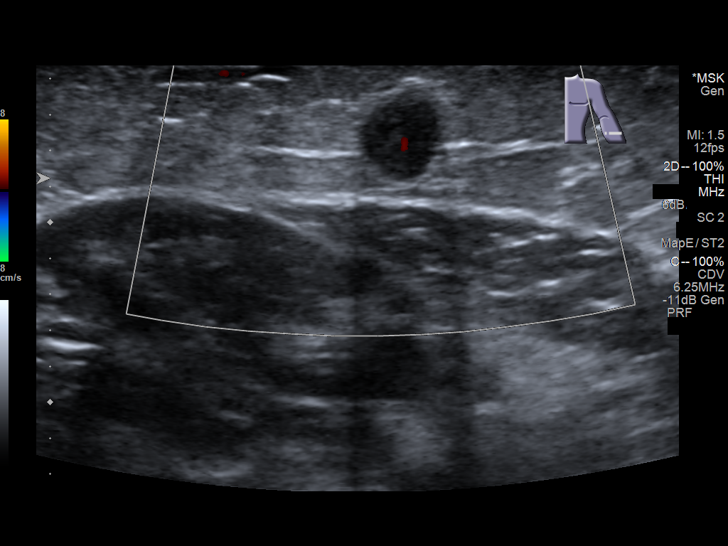
[im 10/19]
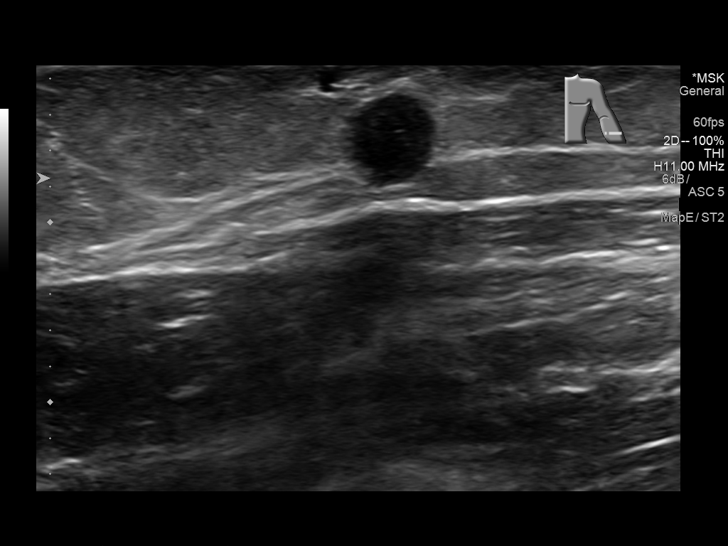
[im 12/19]
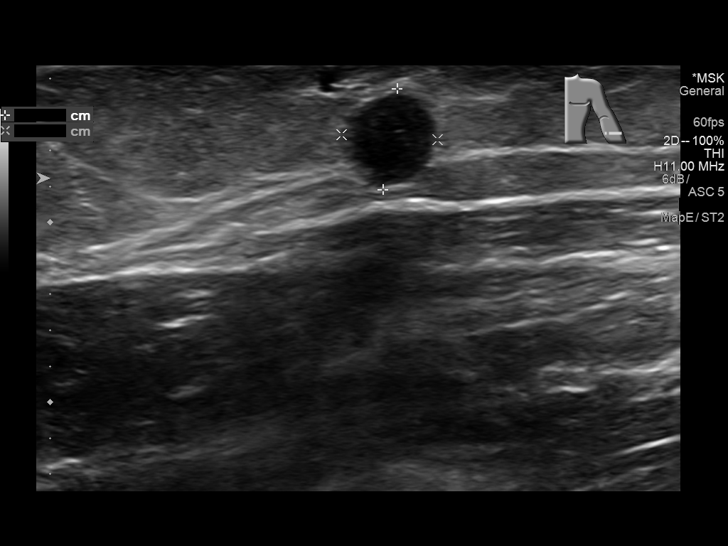
[im 13/19]
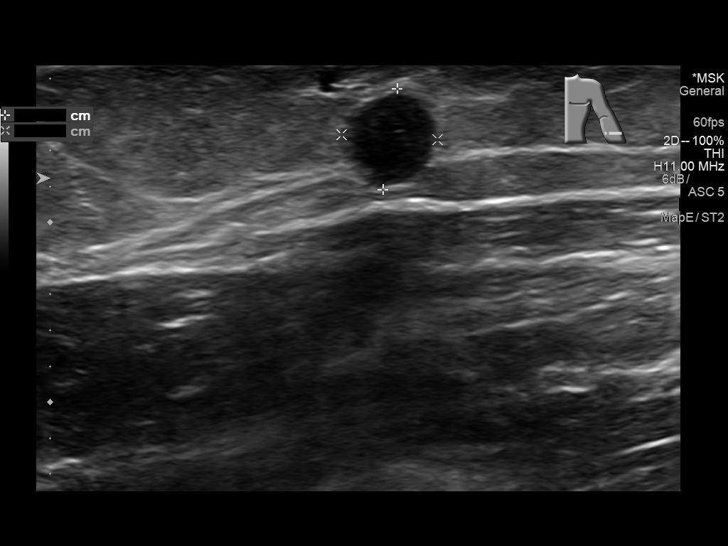
[im 15/19]
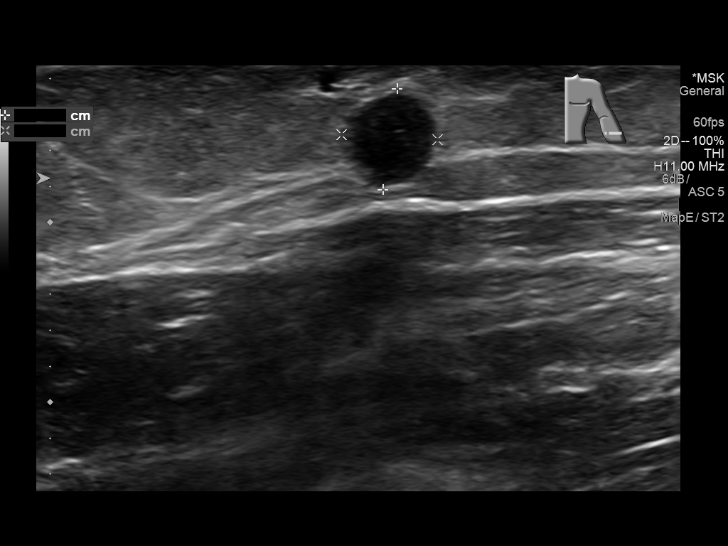
[im 16/19]
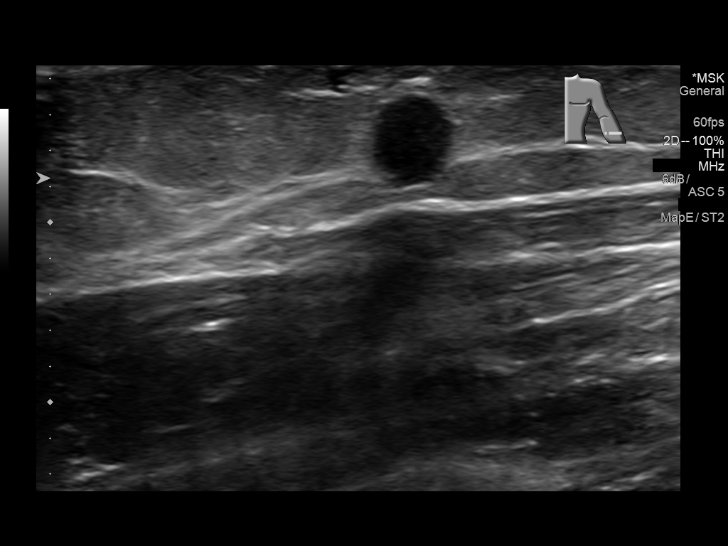
[im 17/19]
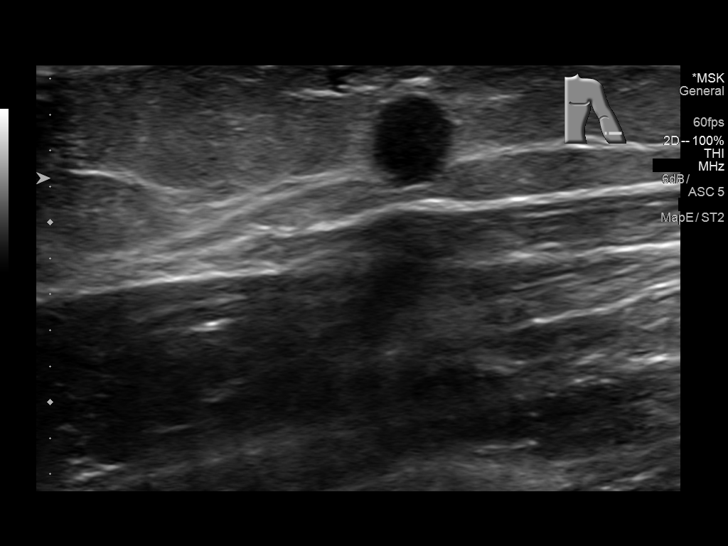
[im 19/19]
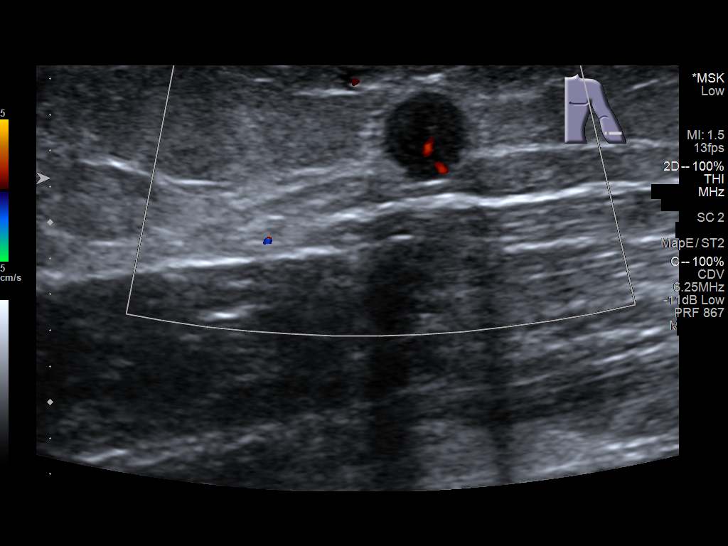

[14 of 20 positions shown; findings below may reference images not displayed]

FINDINGS: A 5 x 6 mm round hypoechoic mass with internal vascular flow is
identified within the anterior subcutaneous tissues of the left
forearm DISH and indeterminate. No other abnormality in this area
noted.
IMPRESSION: Indeterminate 6 mm mass in the anterior subcutaneous tissues of the
left forearm. Recommend tissue sampling/surgical consultation.

## 2017-12-11 ENCOUNTER — Emergency Department (INDEPENDENT_AMBULATORY_CARE_PROVIDER_SITE_OTHER)

## 2017-12-11 ENCOUNTER — Emergency Department (INDEPENDENT_AMBULATORY_CARE_PROVIDER_SITE_OTHER)
Admission: EM | Admit: 2017-12-11 | Discharge: 2017-12-11 | Disposition: A | Discharge status: Home / Self Care | Source: Home / Self Care | Attending: Family Medicine | Admitting: Family Medicine

## 2017-12-11 ENCOUNTER — Other Ambulatory Visit: Payer: Self-pay

## 2017-12-11 DIAGNOSIS — J069 Acute upper respiratory infection, unspecified: Secondary | ICD-10-CM | POA: Diagnosis not present

## 2017-12-11 DIAGNOSIS — R0981 Nasal congestion: Secondary | ICD-10-CM | POA: Diagnosis not present

## 2017-12-11 DIAGNOSIS — R05 Cough: Secondary | ICD-10-CM | POA: Diagnosis not present

## 2017-12-11 DIAGNOSIS — J9801 Acute bronchospasm: Secondary | ICD-10-CM

## 2017-12-11 DIAGNOSIS — B9789 Other viral agents as the cause of diseases classified elsewhere: Secondary | ICD-10-CM

## 2017-12-11 MED ORDER — PREDNISONE 20 MG PO TABS: ORAL_TABLET | ORAL | 0 refills | Status: DC

## 2017-12-11 MED ORDER — DOXYCYCLINE HYCLATE 100 MG PO CAPS: 100.0000 mg | ORAL_CAPSULE | Freq: Two times a day (BID) | ORAL | 0 refills | Status: DC

## 2017-12-11 MED ORDER — BENZONATATE 200 MG PO CAPS: ORAL_CAPSULE | ORAL | 0 refills | Status: DC

## 2017-12-11 NOTE — ED Provider Notes (Signed)
[name] CARE    CSN: [id number] Arrival date & time: 12/11/17  1725     History   Chief Complaint Chief Complaint  Patient presents with  . Cough  . Nasal Congestion    HPI [name] is a 62 y.o. female.   Patient complains of four day history of typical cold-like symptoms developing over several days, including mild sore throat, sinus congestion, headache, fatigue, right earache, fever to 99+, and cough.  She has developed wheezing and intermittent shortness of breath.  She has a history of seasonal rhinitis, and a past history of pneumonia.  The history is provided by the patient.    Past Medical History:  Diagnosis Date  . Allergic rhinitis 04/03/2009  . Anemia    hx  . Arthritis   . Asthma, mild, intermittent 01/23/2009   Qualifier: Diagnosis of  By: Jerold Coombe - denies  . Back pain 08/03/2012  . 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 Chronic diastolic CHF (congestive heart failure) ([place]) 07/20/2015  . Depression, recurrent ([place]) 03/03/2007   Qualifier: Diagnosis of  By: Nils Pyle CMA (AAMA), Mearl Latin  -denies  . Difficult intubation    04/09/04 with difficult intubation. unabale to pass ETT with Sabra Heck 2; unable to visualize cord with MAC 3; intubating LMA used; uneventful intubation using Glidescope 09/27/14  . Diverticulosis, with history of diverticulitis of descending and sigmoid colon 03/03/2007  . Dysrhythmia    atrial fib  . Essential hypertension 06/25/2006   Qualifier: Diagnosis of  By: Tiney Rouge CMA, Ellison Hughs    . Gastritis   . GERD 03/03/2007   Qualifier: Diagnosis of  By: Nils Pyle CMA (AAMA), Mearl Latin   Qualifier: Diagnosis of  By: Shane Crutch, [name]   . Hepatic steatosis, found on CT 03/03/2007      . Hiatal hernia, small 03/28/2016   [facility] by [facility] 12/09/12.   [name] History of colonic polyps 04/21/2008   Last Colonoscopy was 12/09/12, Dr. Fuller Plan. No polyp at that time. Repeat in 5 years.   . Internal hemorrhoids 03/03/2007   Last Colonoscopy  was 12/09/12, Dr. Fuller Plan. Repeat in 5 years.   . Left adrenal mass ([place]) 05/12/2013  . Menopausal vasomotor syndrome 03/28/2016  . Morbid obesity ([place]) (BMI 35 plus 2 comorbidities) 03/06/2016   Comorbid conditions include HTN, HLD, hepatic steatosis, OA, GERD, OSA.  . OSA (obstructive sleep apnea) 03/03/2007   [place].   [name] PAF (paroxysmal atrial fibrillation) ([place]) 04/24/2015  . PONV (postoperative nausea and vomiting)    area on left forearm DO NOT PLACE IV OR STICK ? vascular"will bleed"  . Umbilical hernia without obstruction or gangrene, fat containing 03/28/2016   Found on CT, 2017. No issues.    Patient Active Problem List   Diagnosis Date Noted  . Biliary dyskinesia 10/01/2016  . History of hysterectomy 03/28/2016  . History of appendectomy 03/28/2016  . Umbilical hernia without obstruction or gangrene, fat containing 03/28/2016  . History of fusion of cervical spine 03/28/2016  . Hiatal hernia, small 03/28/2016  . Menopausal vasomotor syndrome 03/28/2016  . Forearm mass, left 03/28/2016  . Morbid obesity ([place]) (BMI 35 plus 2 comorbidities) 03/06/2016  . Chronic diastolic CHF (congestive heart failure) ([place]) 07/20/2015  . PAF (paroxysmal atrial fibrillation) ([place]) 04/24/2015  . History of lumbar spinal fusion 09/27/2014  . 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 Left adrenal mass ([place]) 05/12/2013  . Back pain 08/03/2012  . Allergic rhinitis 04/03/2009  . Asthma, mild, intermittent 01/23/2009  .  History of colonic polyps 04/21/2008  . Depression, recurrent ([place]) 03/03/2007  . Internal hemorrhoids 03/03/2007  . GERD 03/03/2007  . Diverticulosis, with history of diverticulitis of descending and sigmoid colon 03/03/2007  . Hepatic steatosis, found on CT 03/03/2007  . OSA (obstructive sleep apnea) 03/03/2007  . Essential hypertension 06/25/2006    Past Surgical History:  Procedure Laterality Date  . ABDOMINAL HYSTERECTOMY    . APPENDECTOMY    . BREAST  REDUCTION SURGERY    . BUNIONECTOMY     bilateral  . CERVICAL FUSION    . CHOLECYSTECTOMY N/A 10/01/2016   Procedure: LAPAROSCOPIC CHOLECYSTECTOMY;  Surgeon: Coralie Keens, MD;  Location: [facility];  Service: General;  Laterality: N/A;  . MAXIMUM ACCESS (MAS)POSTERIOR LUMBAR INTERBODY FUSION (PLIF) 2 LEVEL N/A 09/27/2014   Procedure: Maximum Access Surgery Posterior Lumbar Interbody Fusion Lumbar three-four, Lumbar four-five;  Surgeon: Eustace Moore, MD;  Location: [facility];  Service: Neurosurgery;  Laterality: N/A;  . SPINE SURGERY    . TOTAL ABDOMINAL HYSTERECTOMY W/ BILATERAL SALPINGOOPHORECTOMY      OB History   None      Home Medications    Prior to Admission medications   Medication Sig Start Date End Date Taking? Authorizing Provider  acetaminophen (TYLENOL) 500 MG tablet Take 500-1,000 mg by mouth every 6 (six) hours as needed (pain.).    [provider]  amLODipine (NORVASC) 5 MG tablet Take 1 tablet (5 mg total) by mouth daily. 05/14/17 05/09/18  Martinique, Peter M, MD  apixaban (ELIQUIS) 5 MG TABS tablet Take 1 tablet (5 mg total) by mouth 2 (two) times daily. 10/01/17   Nahser, Wonda Cheng, MD  benzonatate (TESSALON) 200 MG capsule Take one cap by mouth at bedtime as needed for cough.  May repeat in 4 to 6 hours 12/11/17   Kandra Nicolas, MD  Calcium Carbonate-Vitamin D (CALCIUM 600+D) 600-400 MG-UNIT per tablet Take 1 tablet by mouth daily.     [provider]  carboxymethylcellulose (REFRESH PLUS) 0.5 % SOLN Place 1 drop into both eyes 3 (three) times daily as needed (dry eyes.).    [provider]  doxycycline (VIBRAMYCIN) 100 MG capsule Take 1 capsule (100 mg total) by mouth 2 (two) times daily. Take with food (Rx void after 12/20/17) 12/11/17   Kandra Nicolas, MD  metoprolol succinate (TOPROL-XL) 50 MG 24 hr tablet Take 1 tablet (50 mg total) by mouth daily. Take with or immediately following a meal. Patient taking differently: Take 25 mg by mouth  daily. Take with or immediately following a meal. 09/10/17 09/05/18  Nahser, Wonda Cheng, MD  milk thistle 175 MG tablet Take 175 mg by mouth 2 (two) times daily.     [provider]  Multiple Vitamin (MULTIVITAMIN WITH MINERALS) TABS tablet Take 1 tablet by mouth daily.    [provider]  predniSONE (DELTASONE) 20 MG tablet Take one tab by mouth twice daily for 4 days, then one daily for 3 days. Take with food. 12/11/17   Kandra Nicolas, MD  propranolol (INDERAL) 10 MG tablet Take 1 tablet (10 mg total) by mouth 4 (four) times daily as needed. Patient taking differently: Take 10 mg by mouth 4 (four) times daily as needed (afib).  08/03/17   Nahser, Wonda Cheng, MD  spironolactone (ALDACTONE) 50 MG tablet Take 1 tablet (50 mg total) by mouth daily. 06/17/17   Nahser, Wonda Cheng, MD    Family History Family History  Problem Relation Age of  Onset  . Diabetes Mother   . Hypertension Mother   . Coronary artery disease Sister   . Hypertension Sister   . Colon cancer Neg Hx     Social History Social History   Tobacco Use  . Smoking status: Never Smoker  . Smokeless tobacco: Never Used  Substance Use Topics  . Alcohol use: No  . Drug use: No     Allergies   Other; Losartan; and Tramadol   Review of Systems Review of Systems + sore throat + cough No pleuritic pain + wheezing + nasal congestion + post-nasal drainage No sinus pain/pressure No itchy/red eyes + earache No hemoptysis + SOB + fever, + chills No nausea No vomiting No abdominal pain No diarrhea No urinary symptoms No skin rash + fatigue + myalgias + headache Used OTC meds without relief   Physical Exam Triage Vital Signs ED Triage Vitals [12/11/17 1840]  Enc Vitals Group     BP (!) 154/94     Pulse Rate 74     Resp 18     Temp 98.6 F (37 C)     Temp Source Oral     SpO2 98 %     Weight 227 lb (103 kg)     Height _0  (1.651 m)     Head Circumference      Peak Flow      Pain Score 0       Pain Loc      Pain Edu?      Excl. in [place]?    No data found.  Updated Vital Signs BP (!) 154/94 (BP Location: Right Arm)   Pulse 74   Temp 98.6 F (37 C) (Oral)   Resp 18   Ht _1  (1.651 m)   Wt 103 kg   SpO2 98%   BMI 37.77 kg/m   Visual Acuity Right Eye Distance:   Left Eye Distance:   Bilateral Distance:    Right Eye Near:   Left Eye Near:    Bilateral Near:     Physical Exam Nursing notes and Vital Signs reviewed. Appearance:  Patient appears stated age, and in no acute distress Eyes:  Pupils are equal, round, and reactive to light and accomodation.  Extraocular movement is intact.  Conjunctivae are not inflamed  Ears:  Canals normal.  Tympanic membranes normal.  Nose:  Mildly congested turbinates.  No sinus tenderness.  Pharynx:  Normal Neck:  Supple.  Enlarged posterior/lateral nodes are palpated bilaterally, tender to palpation on the left.   Lungs:  There is bilateral posterior faint expiratory wheezing, most prominent on the right.  Breath sounds are equal.  Moving air well. Heart:  Regular rate and rhythm without murmurs, rubs, or gallops.  Abdomen:  Nontender without masses or hepatosplenomegaly.  Bowel sounds are present.  No CVA or flank tenderness.  Extremities:  No edema.  Skin:  No rash present.    UC Treatments / Results  Labs (all labs ordered are listed, but only abnormal results are displayed) Labs Reviewed - No data to display  EKG None  Radiology Dg Chest 2 View  Result Date: 12/11/2017 CLINICAL DATA:  Cough and nasal congestion, 4 days duration. EXAM: CHEST - 2 VIEW COMPARISON:  09/04/2017 FINDINGS: Heart size is normal. Mediastinal shadows are normal. The lungs are clear. No bronchial thickening. No infiltrate, mass, effusion or collapse. Pulmonary vascularity is normal. No bony abnormality. IMPRESSION: Normal chest Electronically Signed   By: Jan Fireman.D.  On: 12/11/2017 20:03    Procedures Procedures (including critical  care time)  Medications Ordered in UC Medications - No data to display  Initial Impression / Assessment and Plan / UC Course  I have reviewed the triage vital signs and the nursing notes.  Pertinent labs & imaging results that were available during my care of the patient were reviewed by me and considered in my medical decision making (see chart for details).    There is no evidence of bacterial infection today.  Treat symptomatically for now  Begin prednisone burst/taper bronchospasm. Followup with Family Doctor if not improved in about 10 days.   Final Clinical Impressions(s) / UC Diagnoses   Final diagnoses:  Viral URI with cough  Bronchospasm, acute     Discharge Instructions     Take plain guaifenesin (1236m extended release tabs such as Mucinex) twice daily, with plenty of water, for cough and congestion.  Get adequate rest.   May use Afrin nasal spray (or generic oxymetazoline) each morning for about 5 days and then discontinue.  Also recommend using saline nasal spray several times daily and saline nasal irrigation (AYR is a common brand).   Try warm salt water gargles for sore throat.  May take Delsym Cough Suppressant with Tessalon at bedtime for nighttime cough.  Stop all antihistamines for now, and other non-prescription cough/cold preparations. Continue albuterol inhaler as needed. Begin Doxycycline if not improving about one week or if persistent fever develops      ED Prescriptions    Medication Sig Dispense Auth. Provider   predniSONE (DELTASONE) 20 MG tablet Take one tab by mouth twice daily for 4 days, then one daily for 3 days. Take with food. 12 tablet BKandra Nicolas MD   benzonatate (TESSALON) 200 MG capsule Take one cap by mouth at bedtime as needed for cough.  May repeat in 4 to 6 hours 15 capsule BKandra Nicolas MD   doxycycline (VIBRAMYCIN) 100 MG capsule Take 1 capsule (100 mg total) by mouth 2 (two) times daily. Take with food (Rx void after  12/20/17) 14 capsule BKandra Nicolas MD         BKandra Nicolas MD 12/12/17 [phone]

## 2017-12-11 NOTE — ED Triage Notes (Signed)
Pt c/o cough and nasal congestion since Tues. Chills and fatigue as well. Taking OTC cold and sinus as well as mucinex prn.

## 2017-12-11 NOTE — Discharge Instructions (Addendum)
Take plain guaifenesin (1200mg  extended release tabs such as Mucinex) twice daily, with plenty of water, for cough and congestion.  Get adequate rest.   May use Afrin nasal spray (or generic oxymetazoline) each morning for about 5 days and then discontinue.  Also recommend using saline nasal spray several times daily and saline nasal irrigation (AYR is a common brand).   Try warm salt water gargles for sore throat.  May take Delsym Cough Suppressant with Tessalon at bedtime for nighttime cough.  Stop all antihistamines for now, and other non-prescription cough/cold preparations. Continue albuterol inhaler as needed. Begin Doxycycline if not improving about one week or if persistent fever develops

## 2017-12-14 ENCOUNTER — Ambulatory Visit: Admitting: Cardiovascular Disease

## 2017-12-17 ENCOUNTER — Ambulatory Visit (HOSPITAL_COMMUNITY): Admission: RE | Admit: 2017-12-17 | Source: Home / Self Care | Admitting: Gastroenterology

## 2017-12-17 ENCOUNTER — Encounter (HOSPITAL_COMMUNITY): Admission: RE | Payer: Self-pay | Source: Home / Self Care

## 2017-12-17 SURGERY — COLONOSCOPY WITH PROPOFOL
Anesthesia: Monitor Anesthesia Care

## 2018-01-01 ENCOUNTER — Telehealth: Payer: Self-pay | Admitting: *Deleted

## 2018-01-01 ENCOUNTER — Other Ambulatory Visit: Payer: Self-pay | Admitting: Gastroenterology

## 2018-01-01 NOTE — Telephone Encounter (Signed)
   [facility] Pre-operative Risk Assessment    Request for surgical clearance:  1. What type of surgery is being performed? COLONOSCOPY   2. When is this surgery scheduled? 01/26/18   3. What type of clearance is required (medical clearance vs. Pharmacy clearance to hold med vs. Both)? BOTH  4. Are there any medications that need to be held prior to surgery and how long? ELIQUIS   5. Practice name and name of physician performing surgery?  [facility]--DR. MANN   6. What is your office phone number [phone]    7.   What is your office fax number [phone]  8.   Anesthesia type (None, local, MAC, general) ? PROPOFOL   Janan Halter 01/01/2018, 5:01 PM  _________________________________________________________________   (provider comments below)

## 2018-01-04 NOTE — Telephone Encounter (Signed)
Pharmacy please comment on anticoagulation and then I will contact the patient about clearance.  Kerin Ransom PA-C 01/04/2018 3:56 PM

## 2018-01-05 NOTE — Telephone Encounter (Signed)
LMTCB  [name] [name] PA-C 01/05/2018 2:34 PM

## 2018-01-05 NOTE — Telephone Encounter (Signed)
Follow up     Pt returning call regarding clearance for sx

## 2018-01-05 NOTE — Telephone Encounter (Signed)
Patient with diagnosis of atrial fibrillation on Eliquis for anticoagulation.    Procedure: colonoscopy Date of procedure: 01/26/18  CHADS2-VASc score of  3 (CHF, HTN,, female)  CrCl 119.4 Platelet count 421  Per office protocol, patient can hold Eliquis for 1 days prior to procedure.    Patient should restart Eliquis on the evening of procedure or day after, at discretion of procedure MD

## 2018-01-07 NOTE — Telephone Encounter (Signed)
   Primary Cardiologist: Mertie Moores, MD  I spoke with Ms. [name] regarding preop clearance for her colonoscopy.  She tells me that she has had a pneumonia and that she is actually been hospitalized for a week.  She has been slow to recover.  She is in her family doctor's office today.  I suggested she might want to put off her colonoscopy if it is elective until she is back to her baseline.  She does have a follow-up with Dr. Acie Fredrickson in January and will keep that.  Our pharmacist has reviewed this as part of her pre op clearance and suggest that when the patient is felt to be stable for her colonoscopy she can hold her Eliquis 24 hours pre op.   Kerin Ransom PA-C 01/07/2018 3:04 PM

## 2018-01-10 ENCOUNTER — Encounter: Payer: Self-pay | Admitting: Gastroenterology

## 2018-01-18 ENCOUNTER — Telehealth: Payer: Self-pay | Admitting: Cardiovascular Disease

## 2018-01-18 ENCOUNTER — Other Ambulatory Visit: Payer: Self-pay | Admitting: Cardiovascular Disease

## 2018-01-18 MED ORDER — APIXABAN 5 MG PO TABS: 5.0000 mg | ORAL_TABLET | Freq: Two times a day (BID) | ORAL | 2 refills | Status: DC

## 2018-01-18 NOTE — Telephone Encounter (Signed)
Pt last saw Dr Acie Fredrickson 09/10/17, last labs 09/04/17 Creat 0.81, age 63, weight 102.4kg, based on specified criteria pt is on appropriate dosage of Eliquis 5mg  BID.  Will refill rx.

## 2018-01-18 NOTE — Telephone Encounter (Signed)
Called pt to inform pt that I sent her request to the coumadin clinic to resend her medication to her requested pharmacy and that I was leaving a box of samples of Eliquis at the front desk for pt to pick upt and if she has any other problems, questions or concerns to call the office. Pt verbalized understanding.

## 2018-01-18 NOTE — Telephone Encounter (Signed)
New Message   Patient calling the office for samples of medication:   1.  What medication and dosage are you requesting samples for? apixaban (ELIQUIS) 5 MG TABS tablet  2.  Are you currently out of this medication? Yes, waiting on mail order

## 2018-01-18 NOTE — Telephone Encounter (Signed)
Pt's medication was sent to wrong pharmacy, pt needed her medication of Elquis sent to [facility]. Please resend this medication. I called pt and informed pt that I would leave a box of samples of Eliquis at the front desk for pt to pick and if she has any other problems, questions or concerns to call the office. Pt verbalized understanding.

## 2018-01-26 ENCOUNTER — Ambulatory Visit (HOSPITAL_COMMUNITY): Admission: RE | Admit: 2018-01-26 | Source: Home / Self Care | Admitting: Gastroenterology

## 2018-01-26 ENCOUNTER — Encounter (HOSPITAL_COMMUNITY): Admission: RE | Payer: Self-pay | Source: Home / Self Care

## 2018-01-26 SURGERY — COLONOSCOPY WITH PROPOFOL
Anesthesia: Monitor Anesthesia Care

## 2018-02-01 ENCOUNTER — Encounter: Payer: Self-pay | Admitting: Cardiovascular Disease

## 2018-02-01 ENCOUNTER — Other Ambulatory Visit: Payer: Self-pay | Admitting: *Deleted

## 2018-02-01 ENCOUNTER — Ambulatory Visit (INDEPENDENT_AMBULATORY_CARE_PROVIDER_SITE_OTHER): Admitting: Cardiovascular Disease

## 2018-02-01 VITALS — BP 128/80 | HR 48 | Ht 64.0 in | Wt 231.8 lb

## 2018-02-01 DIAGNOSIS — Z13220 Encounter for screening for lipoid disorders: Secondary | ICD-10-CM | POA: Diagnosis not present

## 2018-02-01 DIAGNOSIS — I480 Paroxysmal atrial fibrillation: Secondary | ICD-10-CM | POA: Diagnosis not present

## 2018-02-01 DIAGNOSIS — E669 Obesity, unspecified: Secondary | ICD-10-CM | POA: Diagnosis not present

## 2018-02-01 NOTE — Patient Instructions (Addendum)
Medication Instructions:  Your physician recommends that you continue on your current medications as directed. Please refer to the Current Medication list given to you today.  If you need a refill on your cardiac medications before your next appointment, please call your pharmacy.    Lab work: Your physician recommends that you return for lab work in: 6 months on the day of or a few days before your office visit with Dr. Acie Fredrickson.  You will need to FAST for this appointment - nothing to eat or drink after midnight the night before except water.    Testing/Procedures: None Ordered   Follow-Up: At [facility], you and your health needs are our priority.  As part of our continuing mission to provide you with exceptional heart care, we have created designated Provider Care Teams.  These Care Teams include your primary Cardiologist (physician) and Advanced Practice Providers (APPs -  Physician Assistants and Nurse Practitioners) who all work together to provide you with the care you need, when you need it. You will need a follow up appointment in:  6 months.  Please call our office 2 months in advance to schedule this appointment.  You may see Mertie Moores, MD or one of the following Advanced Practice Providers on your designated Care Team: Richardson Dopp, PA-C [place], [place] . Daune Perch, NP  [employer] [phone]

## 2018-02-01 NOTE — Progress Notes (Signed)
Cardiology Office Note   Date:  02/01/2018   ID:  [name], [name] 10/11/1955, MRN [id number]  PCP:  Seward Carol, MD  Cardiologist:   Mertie Moores, MD   Chief Complaint  Patient presents with  . Atrial Fibrillation   Problem List 1. Atrial fib - This patients CHA2DS2-VASc Score and unadjusted Ischemic Stroke Rate (% per year) is equal to 2.2 % stroke rate/year from a score of 2  Above score calculated as 1 point each if present [CHF, HTN, DM, Vascular=MI/PAD/Aortic Plaque, Age if 65-74, or Female] Above score calculated as 2 points each if present [Age > 75, or Stroke/TIA/TE]  2. Essential HTN 3. Grade 2 diastolic dysfunction  - normal LV function 4. Obstructive sleep apnea -   [name] is a 63 y.o. female who presents for atrial fib, She was seen in the ER with PAF Has had a dull head ache.   Echo shows normal LV systolic function Myoview shows no ischemia   July 20, 2015:  [name] is seen today for follow up visit for her paroxysmal atrial fib.  Has had 1 episode of palpitations - likely was PAF - related to stress.  Is watching the salt in her diet .  The Xarelto seems to be upsetting her stomach  Held it for several days and her stomach started bothering her again  March 06, 2016:  Doing well  Having some back pain  Had an episode of atrial fib 3 nights ago.   Work her up . Has OAS - uses CPAP but she had pulled it off. She put the CPAP mask back on   09/15/2016: [name] is seen back for follow-up evaluation. Wt is 228 ( down 9 lbs from last visit )  Has been having more palpitations.  Took some extra metoprolol which seems    Has not been using her CPAP because her mask has lots of condensation and drains back into there mask .  Has been tried on Lisinopril ( caused CP and cough) and was on Valsartan ( was recalled )  Doing well on the [facility]   March 18, 2017:   [name] is seen back today for follow-up of her chronic diastolic  congestive heart failure, paroxysmal atrial fibrillation Has had difficult losing weight Has night sweats - possbily due to menapause  BP is a little elevated.   Under lots of stress  - sister is in the hospital ( heavy smoker)  No dyspnea. Still has occasional palpitations - took an extra propranolol which helps   June 17, 2017:  [name] is seen back for an early work in visit Was seen by Dr. Martinique a month ago.  She was having episodes of chest pain and an elevated blood pressure. She had been seen in the emergency room complaining of chest pain and left arm pain.  Troponin levels were normal. Dr. Doug Sou opinion was that the chest pain was related to her elevated blood pressure.  She has had a normal Myoview study 2 years ago.  The plan is to repeat the Myoview study if she has recurrent symptoms.  BP is still  Moderately elevated  She has cut back on her salt.    Has seen a nutritionist.      August 03, 2017: [name] is seen today for follow-up of her hypertension and chronic diastolic congestive heart failure. BP readings have been elevated.  Has been to the [facility]   she had some Hibiscus  Tea yesterday  -  BP is normal today   Sept. 5, 2019: [name] is seen today for follow-up of her hypertension and chronic diastolic congestive heart failure.  She was recently seen in the emergency room for paroxysmal atrial fibrillation.  She has a history of obstructive sleep apnea but has not been wearing her CPAP mask.  Episode of A flutter  - woke her up at 5 am.   Sent to the ER  Aflutter resolved with IV cardiazem   February 01, 2018: [name] was seen in the emergency room in December, 2019.   Thought to have bronchitisWas admitted to [facility] over the [facility] holidays  . Pressure is been well controlled. Received lots of meds , steroids. Is just now getting better .  Is wearing her CPAP  BP is well controlled.  Has gained some  weight Wt today is 231 lbs    ( weight in Sept. 2019 was 225)   [facility] .     Past Medical History:  Diagnosis Date  . Allergic rhinitis 04/03/2009  . Anemia    hx  . Arthritis   . Asthma, mild, intermittent 01/23/2009   Qualifier: Diagnosis of  By: Jerold Coombe - denies  . Back pain 08/03/2012  . 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 Chronic diastolic CHF (congestive heart failure) ([place]) 07/20/2015  . Depression, recurrent ([place]) 03/03/2007   Qualifier: Diagnosis of  By: Nils Pyle CMA (AAMA), Mearl Latin  -denies  . Difficult intubation    04/09/04 with difficult intubation. unabale to pass ETT with Sabra Heck 2; unable to visualize cord with MAC 3; intubating LMA used; uneventful intubation using Glidescope 09/27/14  . Diverticulosis, with history of diverticulitis of descending and sigmoid colon 03/03/2007  . Dysrhythmia    atrial fib  . Essential hypertension 06/25/2006   Qualifier: Diagnosis of  By: Tiney Rouge CMA, Ellison Hughs    . Gastritis   . GERD 03/03/2007   Qualifier: Diagnosis of  By: Nils Pyle CMA (AAMA), Mearl Latin   Qualifier: Diagnosis of  By: Shane Crutch, [name]   . Hepatic steatosis, found on CT 03/03/2007      . Hiatal hernia, small 03/28/2016   EGD by [facility] 12/09/12.   [name] History of colonic polyps 04/21/2008   Last Colonoscopy was 12/09/12, Dr. Fuller Plan. No polyp at that time. Repeat in 5 years.   . Internal hemorrhoids 03/03/2007   Last Colonoscopy was 12/09/12, Dr. Fuller Plan. Repeat in 5 years.   . Left adrenal mass ([place]) 05/12/2013  . Menopausal vasomotor syndrome 03/28/2016  . Morbid obesity ([place]) (BMI 35 plus 2 comorbidities) 03/06/2016   Comorbid conditions include HTN, HLD, hepatic steatosis, OA, GERD, OSA.  . OSA (obstructive sleep apnea) 03/03/2007   [place].   [name] PAF (paroxysmal atrial fibrillation) ([place]) 04/24/2015  . PONV (postoperative nausea and vomiting)    area on left forearm DO NOT PLACE IV OR STICK ?  vascular"will bleed"  . Umbilical hernia without obstruction or gangrene, fat containing 03/28/2016   Found on CT, 2017. No issues.    Past Surgical History:  Procedure Laterality Date  . ABDOMINAL HYSTERECTOMY    . APPENDECTOMY    . BREAST REDUCTION SURGERY    . BUNIONECTOMY     bilateral  . CERVICAL FUSION    . CHOLECYSTECTOMY N/A 10/01/2016   Procedure: LAPAROSCOPIC CHOLECYSTECTOMY;  Surgeon: Coralie Keens, MD;  Location: [facility];  Service: General;  Laterality: N/A;  . MAXIMUM ACCESS (MAS)POSTERIOR LUMBAR INTERBODY FUSION (PLIF) 2 LEVEL N/A 09/27/2014   Procedure: Maximum Access Surgery Posterior Lumbar Interbody Fusion Lumbar three-four, Lumbar four-five;  Surgeon: Eustace Moore, MD;  Location: [facility];  Service: Neurosurgery;  Laterality: N/A;  . SPINE SURGERY    . TOTAL ABDOMINAL HYSTERECTOMY W/ BILATERAL SALPINGOOPHORECTOMY       Current Outpatient Medications  Medication Sig Dispense Refill  . amLODipine (NORVASC) 5 MG tablet Take 1 tablet (5 mg total) by mouth daily. 90 tablet 3  . apixaban (ELIQUIS) 5 MG TABS tablet Take 1 tablet (5 mg total) by mouth 2 (two) times daily. 180 tablet 2  . Calcium Carbonate-Vitamin D (CALCIUM 600+D) 600-400 MG-UNIT per tablet Take 1 tablet by mouth daily.     . metoprolol succinate (TOPROL-XL) 25 MG 24 hr tablet Take 25 mg by mouth daily.    . milk thistle 175 MG tablet Take 175 mg by mouth 2 (two) times daily.     . Multiple Vitamin (MULTIVITAMIN WITH MINERALS) TABS tablet Take 1 tablet by mouth daily.    . propranolol (INDERAL) 10 MG tablet Take 1 tablet (10 mg total) by mouth 4 (four) times daily as needed. (Patient taking differently: Take 10 mg by mouth 4 (four) times daily as needed (afib). ) 60 tablet 6  . spironolactone (ALDACTONE) 50 MG tablet Take 1 tablet (50 mg total) by mouth daily. 90 tablet 3   No current facility-administered medications for this visit.     Allergies:   Other; Losartan; and Tramadol    Social  History:  The patient  reports that she has never smoked. She has never used smokeless tobacco. She reports that she does not drink alcohol or use drugs.   Family History:  The patient's family history includes Coronary artery disease in her sister; Diabetes in her mother; Hypertension in her mother and sister.    ROS:  Please see the history of present illness.    Physical Exam: Blood pressure 128/80, pulse (!) 48, height _0  (1.626 m), weight 231 lb 12.8 oz (105.1 kg), SpO2 98 %.  GEN:  Well nourished, well developed in no acute distress HEENT: Normal NECK: No JVD; No carotid bruits LYMPHATICS: No lymphadenopathy CARDIAC: RRR , no murmurs, rubs, gallops RESPIRATORY:  Clear to auscultation without rales, wheezing or rhonchi  ABDOMEN: Soft, non-tender, non-distended MUSCULOSKELETAL:  No edema; No deformity  SKIN: Warm and dry NEUROLOGIC:  Alert and oriented x 3   EKG:    Recent Labs: 09/04/2017: BUN 9; Creatinine, Ser 0.81; Hemoglobin 16.4; Platelets 421; Potassium 4.5; Sodium 139    Lipid Panel    Component Value Date/Time   CHOL 129 04/22/2016 1234   CHOL 126 03/06/2016 0819   TRIG 103.0 04/22/2016 1234   HDL 41.80 04/22/2016 1234   HDL 40 03/06/2016 0819   CHOLHDL 3 04/22/2016 1234   VLDL 20.6 04/22/2016 1234   LDLCALC 67 04/22/2016 1234   LDLCALC 62 03/06/2016 0819      Wt Readings from Last 3 Encounters:  02/01/18 231 lb 12.8 oz (105.1 kg)  12/11/17 227 lb (103 kg)  09/18/17 227 lb (103 kg)      Other studies Reviewed: Additional studies/ records that were reviewed today include: . Review of the above records demonstrates:    ASSESSMENT AND PLAN:  1.  Paroxysmal atrial fibrillation:  Stable,  No recent episodes    2.   Essential hypertension:  BP is well controlled.   3.    Chronic diastolic congestive heart failure:    Continue meds.   4.  Chest pain:    5.  Obesity:   Needs to lose weight.      Current medicines are reviewed at length  with the patient today.  The patient does not have concerns regarding medicines.  Labs/ tests ordered today include:   Orders Placed This Encounter  Procedures  . Basic Metabolic Panel (BMET)  . Hepatic function panel  . Lipid Profile    Disposition:   FU with me in 3 months       Mertie Moores, MD  02/01/2018 [facility] [place], [place], [place]  [address] Phone: [phone]; Fax: [phone]

## 2018-02-03 ENCOUNTER — Other Ambulatory Visit: Payer: Self-pay | Admitting: Cardiovascular Disease

## 2018-02-03 MED ORDER — AMLODIPINE BESYLATE 5 MG PO TABS: 5.0000 mg | ORAL_TABLET | Freq: Every day | ORAL | 3 refills | Status: DC

## 2018-02-03 MED ORDER — PROPRANOLOL HCL 10 MG PO TABS: 10.0000 mg | ORAL_TABLET | Freq: Four times a day (QID) | ORAL | 3 refills | Status: DC | PRN

## 2018-02-03 NOTE — Telephone Encounter (Signed)
°*  STAT* If patient is at the pharmacy, call can be transferred to refill team.   1. Which medications need to be refilled? (please list name of each medication and dose if known)   propranolol (INDERAL) 10 MG tablet  amLODipine (NORVASC) 5 MG tablet  2. Which pharmacy/location (including street and city if local pharmacy) is medication to be sent to?  [facility] - [place], [place] - [address]  3. Do they need a 30 day or 90 day supply?   90 days

## 2018-02-03 NOTE — Telephone Encounter (Signed)
Pt's medications were sent to pt's pharmacy as requested. Confirmation received.  

## 2018-04-13 ENCOUNTER — Other Ambulatory Visit: Payer: Self-pay | Admitting: Cardiovascular Disease

## 2018-04-13 MED ORDER — METOPROLOL SUCCINATE ER 25 MG PO TB24: 25.0000 mg | ORAL_TABLET | Freq: Every day | ORAL | 3 refills | Status: DC

## 2018-04-13 MED ORDER — METOPROLOL SUCCINATE ER 25 MG PO TB24: 25.0000 mg | ORAL_TABLET | Freq: Every day | ORAL | 2 refills | Status: DC

## 2018-04-13 NOTE — Telephone Encounter (Signed)
New Message     *STAT* If patient is at the pharmacy, call can be transferred to refill team.   1. Which medications need to be refilled? (please list name of each medication and dose if known) Metoprolol   2. Which pharmacy/location (including street and city if local pharmacy) is medication to be sent to? [facility] [address]  [place]  3. Do they need a 30 day or 90 day supply? 90 Day

## 2018-04-13 NOTE — Telephone Encounter (Signed)
Pt's husband called requesting that Metoprolol be resent to [facility]. I resent the medication as requested. Confirmation received.

## 2018-04-13 NOTE — Addendum Note (Signed)
Addended by: Derl Barrow on: 04/13/2018 02:19 PM   Modules accepted: Orders

## 2018-07-29 ENCOUNTER — Telehealth: Payer: Self-pay | Admitting: Cardiovascular Disease

## 2018-07-29 NOTE — Telephone Encounter (Signed)
New Message ° ° °Patient calling the office for samples of medication: ° ° °1.  What medication and dosage are you requesting samples for?apixaban (ELIQUIS) 5 MG TABS tablet  ° °2.  Are you currently out of this medication? Yes  ° ° ° °

## 2018-07-29 NOTE — Telephone Encounter (Signed)
Samples of this drug were given to the patient Eliquis 5 mg quantity 2 boxes, expires 4/22 Lot Number [id number]  Pt notified that samples are available for pick up at front desk.

## 2018-10-01 ENCOUNTER — Other Ambulatory Visit: Admitting: *Deleted

## 2018-10-01 ENCOUNTER — Other Ambulatory Visit: Payer: Self-pay | Admitting: Cardiovascular Disease

## 2018-10-01 ENCOUNTER — Other Ambulatory Visit: Payer: Self-pay

## 2018-10-01 DIAGNOSIS — E669 Obesity, unspecified: Secondary | ICD-10-CM

## 2018-10-01 DIAGNOSIS — I480 Paroxysmal atrial fibrillation: Secondary | ICD-10-CM

## 2018-10-01 DIAGNOSIS — Z13220 Encounter for screening for lipoid disorders: Secondary | ICD-10-CM

## 2018-10-01 LAB — LIPID PANEL
Chol/HDL Ratio: 3.3 ratio (ref 0.0–4.4)
Cholesterol, Total: 139 mg/dL (ref 100–199)
HDL: 42 mg/dL (ref >39)
LDL Chol Calc (NIH): 75 mg/dL (ref 0–99)
Triglycerides: 124 mg/dL (ref 0–149)
VLDL Cholesterol Cal: 22 mg/dL (ref 5–40)

## 2018-10-01 LAB — HEPATIC FUNCTION PANEL
ALT: 24 IU/L (ref 0–32)
AST: 25 IU/L (ref 0–40)
Albumin: 4.3 g/dL (ref 3.8–4.8)
Alkaline Phosphatase: 70 IU/L (ref 39–117)
Bilirubin Total: 0.5 mg/dL (ref 0.0–1.2)
Bilirubin, Direct: 0.16 mg/dL (ref 0.00–0.40)
Total Protein: 6.9 g/dL (ref 6.0–8.5)

## 2018-10-01 LAB — BASIC METABOLIC PANEL
BUN/Creatinine Ratio: 8 — ABNORMAL LOW (ref 12–28)
BUN: 8 mg/dL (ref 8–27)
CO2: 23 mmol/L (ref 20–29)
Calcium: 9.7 mg/dL (ref 8.7–10.3)
Chloride: 105 mmol/L (ref 96–106)
Creatinine, Ser: 1.01 mg/dL — ABNORMAL HIGH (ref 0.57–1.00)
GFR calc Af Amer: 69 mL/min/{1.73_m2} (ref >59)
GFR calc non Af Amer: 60 mL/min/{1.73_m2} (ref >59)
Glucose: 95 mg/dL (ref 65–99)
Potassium: 4.8 mmol/L (ref 3.5–5.2)
Sodium: 141 mmol/L (ref 134–144)

## 2018-10-03 NOTE — Progress Notes (Signed)
Cardiology Office Note   Date:  10/04/2018   ID:  [name], [name] [birth year], MRN [id number]  PCP:  Patient, No Pcp Per  Cardiologist:   Mertie Moores, MD   Chief Complaint  Patient presents with  . Atrial Fibrillation  . Hypertension   Problem List 1. Atrial fib - This patients CHA2DS2-VASc Score and unadjusted Ischemic Stroke Rate (% per year) is equal to 2.2 % stroke rate/year from a score of 2  Above score calculated as 1 point each if present [CHF, HTN, DM, Vascular=MI/PAD/Aortic Plaque, Age if 65-74, or Female] Above score calculated as 2 points each if present [Age > 75, or Stroke/TIA/TE]  2. Essential HTN 3. Grade 2 diastolic dysfunction  - normal LV function 4. Obstructive sleep apnea -   [name] is a 63 y.o. female who presents for atrial fib, She was seen in the ER with PAF Has had a dull head ache.   Echo shows normal LV systolic function Myoview shows no ischemia   July 20, 2015:  [name] is seen today for follow up visit for her paroxysmal atrial fib.  Has had 1 episode of palpitations - likely was PAF - related to stress.  Is watching the salt in her diet .  The Xarelto seems to be upsetting her stomach  Held it for several days and her stomach started bothering her again  March 06, 2016:  Doing well  Having some back pain  Had an episode of atrial fib 3 nights ago.   Work her up . Has OAS - uses CPAP but she had pulled it off. She put the CPAP mask back on   09/15/2016: [name] is seen back for follow-up evaluation. Wt is 228 ( down 9 lbs from last visit )  Has been having more palpitations.  Took some extra metoprolol which seems    Has not been using her CPAP because her mask has lots of condensation and drains back into there mask .  Has been tried on Lisinopril ( caused CP and cough) and was on Valsartan ( was recalled )  Doing well on the [facility]   March 18, 2017:   [name] is seen back today for follow-up of her chronic  diastolic congestive heart failure, paroxysmal atrial fibrillation Has had difficult losing weight Has night sweats - possbily due to menapause  BP is a little elevated.   Under lots of stress  - sister is in the hospital ( heavy smoker)  No dyspnea. Still has occasional palpitations - took an extra propranolol which helps   June 17, 2017:  [name] is seen back for an early work in visit Was seen by Dr. Martinique a month ago.  She was having episodes of chest pain and an elevated blood pressure. She had been seen in the emergency room complaining of chest pain and left arm pain.  Troponin levels were normal. Dr. Doug Sou opinion was that the chest pain was related to her elevated blood pressure.  She has had a normal Myoview study 2 years ago.  The plan is to repeat the Myoview study if she has recurrent symptoms.  BP is still  Moderately elevated  She has cut back on her salt.    Has seen a nutritionist.      August 03, 2017: [name] is seen today for follow-up of her hypertension and chronic diastolic congestive heart failure. BP readings have been elevated.  Has been to the [facility]  she had some Hibiscus Tea yesterday  -  BP is normal today   Sept. 5, 2019: [name] is seen today for follow-up of her hypertension and chronic diastolic congestive heart failure.  She was recently seen in the emergency room for paroxysmal atrial fibrillation.  She has a history of obstructive sleep apnea but has not been wearing her CPAP mask.  Episode of A flutter  - woke her up at 5 am.   Sent to the ER  Aflutter resolved with IV cardiazem   February 01, 2018: [name] was seen in the emergency room in December, 2019.   Th[facility] to [facility]  . Pressure is been well controlled. Received lots of meds , steroids. Is just now getting better .  Is wearing her CPAP  BP is well controlled.  Has gained some  weight Wt today is 231 lbs    ( weight in Sept. 2019 was 225)   Is [facility] a [facility] .   Sept.  28, 2020   [name] is seen today for follow up of her PAF and HTN  Wt.239 lbs ( up 8 lbs since last year )  Was going to a trainer  But has gained wt with covid .  No dyspnea.  No cp,     Past Medical History:  Diagnosis Date  . Allergic rhinitis 04/03/2009  . Anemia    hx  . Arthritis   . Asthma, mild, intermittent 01/23/2009   Qualifier: Diagnosis of  By: Jerold Coombe - denies  . Back pain 08/03/2012  . 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 Chronic diastolic CHF (congestive heart failure) ([place]) 07/20/2015  . Depression, recurrent ([place]) 03/03/2007   Qualifier: Diagnosis of  By: Nils Pyle CMA (AAMA), Mearl Latin  -denies  . Difficult intubation    04/09/04 with difficult intubation. unabale to pass ETT with Sabra Heck 2; unable to visualize cord with MAC 3; intubating LMA used; uneventful intubation using Glidescope 09/27/14  . Diverticulosis, with history of diverticulitis of descending and sigmoid colon 03/03/2007  . Dysrhythmia    atrial fib  . Essential hypertension 06/25/2006   Qualifier: Diagnosis of  By: Tiney Rouge CMA, Ellison Hughs    . Gastritis   . GERD 03/03/2007   Qualifier: Diagnosis of  By: Nils Pyle CMA (AAMA), Mearl Latin   Qualifier: Diagnosis of  By: Shane Crutch, [name]   . Hepatic steatosis, found on CT 03/03/2007      . Hiatal hernia, small 03/28/2016   EGD by [facility] 12/09/12.   [name] History of colonic polyps 04/21/2008   Last Colonoscopy was 12/09/12, Dr. Fuller Plan. No polyp at that time. Repeat in 5 years.   . Internal hemorrhoids 03/03/2007   Last Colonoscopy was 12/09/12, Dr. Fuller Plan. Repeat in 5 years.   . Left adrenal mass ([place]) 05/12/2013  . Menopausal vasomotor syndrome 03/28/2016  . Morbid obesity ([place]) (BMI 35 plus 2 comorbidities) 03/06/2016   Comorbid conditions include HTN, HLD, hepatic steatosis, OA, GERD, OSA.  . OSA (obstructive sleep apnea)  03/03/2007   [place].   [name] PAF (paroxysmal atrial fibrillation) ([place]) 04/24/2015  . PONV (postoperative nausea and vomiting)    area on left forearm DO NOT PLACE IV OR STICK ? vascular"will bleed"  . Umbilical hernia without obstruction or gangrene, fat containing 03/28/2016   Found on CT, 2017. No issues.    Past Surgical History:  Procedure Laterality Date  . ABDOMINAL HYSTERECTOMY    . APPENDECTOMY    . BREAST REDUCTION SURGERY    . BUNIONECTOMY     bilateral  . CERVICAL FUSION    . CHOLECYSTECTOMY N/A 10/01/2016   Procedure: LAPAROSCOPIC CHOLECYSTECTOMY;  Surgeon: Coralie Keens, MD;  Location: [facility];  Service: General;  Laterality: N/A;  . MAXIMUM ACCESS (MAS)POSTERIOR LUMBAR INTERBODY FUSION (PLIF) 2 LEVEL N/A 09/27/2014   Procedure: Maximum Access Surgery Posterior Lumbar Interbody Fusion Lumbar three-four, Lumbar four-five;  Surgeon: Eustace Moore, MD;  Location: [facility];  Service: Neurosurgery;  Laterality: N/A;  . SPINE SURGERY    . TOTAL ABDOMINAL HYSTERECTOMY W/ BILATERAL SALPINGOOPHORECTOMY       Current Outpatient Medications  Medication Sig Dispense Refill  . amLODipine (NORVASC) 5 MG tablet Take 1 tablet (5 mg total) by mouth daily. 90 tablet 3  . apixaban (ELIQUIS) 5 MG TABS tablet Take 1 tablet (5 mg total) by mouth 2 (two) times daily. 180 tablet 2  . Calcium Carbonate-Vitamin D (CALCIUM 600+D) 600-400 MG-UNIT per tablet Take 1 tablet by mouth daily.     . metoprolol succinate (TOPROL-XL) 25 MG 24 hr tablet Take 1 tablet (25 mg total) by mouth daily. 90 tablet 3  . Multiple Vitamin (MULTIVITAMIN WITH MINERALS) TABS tablet Take 1 tablet by mouth daily.    . propranolol (INDERAL) 10 MG tablet Take 1 tablet (10 mg total) by mouth 4 (four) times daily as needed. 360 tablet 3  . spironolactone (ALDACTONE) 50 MG tablet Take 25 mg by mouth daily.     No current facility-administered medications for this visit.     Allergies:   Other, Losartan, and  Tramadol    Social History:  The patient  reports that she has never smoked. She has never used smokeless tobacco. She reports that she does not drink alcohol or use drugs.   Family History:  The patient's family history includes Coronary artery disease in her sister; Diabetes in her mother; Hypertension in her mother and sister.    ROS:  Please see the history of present illness.   Physical Exam: Blood pressure 138/82, pulse 62, height _0  (1.651 m), weight 239 lb 12.8 oz (108.8 kg), SpO2 97 %.  GEN:   Morbidly obese female.     HEENT: Normal NECK: No JVD; No carotid bruits LYMPHATICS: No lymphadenopathy CARDIAC: RRR  RESPIRATORY:  Clear to auscultation without rales, wheezing or rhonchi  ABDOMEN: Soft, non-tender, non-distended MUSCULOSKELETAL:  No edema; No deformity  SKIN: Warm and dry NEUROLOGIC:  Alert and oriented x 3   EKG:  Sept. 28, 2020 :   NSR at 62.   Sinus arrhythmia,  Voltage criteria for LVH.  NS ST / T abn.   Recent Labs: 10/01/2018: ALT 24; BUN 8; Creatinine, Ser 1.01; Potassium 4.8; Sodium 141    Lipid Panel    Component Value Date/Time   CHOL 139 10/01/2018 0900   TRIG 124 10/01/2018 0900   HDL 42 10/01/2018 0900   CHOLHDL 3.3 10/01/2018 0900   CHOLHDL 3 04/22/2016 1234   VLDL 20.6 04/22/2016 1234   LDLCALC 75 10/01/2018 0900      Wt Readings from Last 3 Encounters:  10/04/18 239 lb 12.8 oz (108.8 kg)  02/01/18 231 lb 12.8 oz (105.1 kg)  12/11/17 227 lb (103 kg)      Other studies Reviewed: Additional studies/ records that were reviewed today include: . Review of the above records demonstrates:  ASSESSMENT AND PLAN:  1.  Paroxysmal atrial fibrillation:    In NSR .   No significant palpitations .   2.   Essential hypertension:     BP is well controlled.   3.    Chronic diastolic congestive heart failure:   Stable,  Needs to work on weight loss.    4.  Chest pain:    5.  Obesity:  Has gained weight.   Advised calorie restriction  and exercise      Current medicines are reviewed at length with the patient today.  The patient does not have concerns regarding medicines.  Labs/ tests ordered today include:   Orders Placed This Encounter  Procedures  . Amb Referral To Provider Referral Exercise Program (P.R.E.P)  . EKG 12-Lead    Disposition:   FU with me in  1 year      Mertie Moores, MD  10/04/2018 5:41 PM    [address], [place], [place]  [address] Phone: [phone]; Fax: [phone]

## 2018-10-04 ENCOUNTER — Ambulatory Visit (INDEPENDENT_AMBULATORY_CARE_PROVIDER_SITE_OTHER): Admitting: Cardiovascular Disease

## 2018-10-04 ENCOUNTER — Other Ambulatory Visit: Payer: Self-pay

## 2018-10-04 ENCOUNTER — Encounter: Payer: Self-pay | Admitting: Cardiovascular Disease

## 2018-10-04 VITALS — BP 138/82 | HR 62 | Ht 65.0 in | Wt 239.8 lb

## 2018-10-04 DIAGNOSIS — E669 Obesity, unspecified: Secondary | ICD-10-CM

## 2018-10-04 DIAGNOSIS — I10 Essential (primary) hypertension: Secondary | ICD-10-CM | POA: Diagnosis not present

## 2018-10-04 DIAGNOSIS — I5032 Chronic diastolic (congestive) heart failure: Secondary | ICD-10-CM

## 2018-10-04 DIAGNOSIS — I480 Paroxysmal atrial fibrillation: Secondary | ICD-10-CM

## 2018-10-04 MED ORDER — METOPROLOL SUCCINATE ER 25 MG PO TB24: 25.0000 mg | ORAL_TABLET | Freq: Every day | ORAL | 3 refills | Status: DC

## 2018-10-04 NOTE — Patient Instructions (Addendum)
Medication Instructions:  Your physician recommends that you continue on your current medications as directed. Please refer to the Current Medication list given to you today.  If you need a refill on your cardiac medications before your next appointment, please call your pharmacy.    Lab work: None Ordered   Testing/Procedures: None Ordered   Follow-Up: You have been referred to Provider Referral Exercise Program at [facility], you and your health needs are our priority.  As part of our continuing mission to provide you with exceptional heart care, we have created designated Provider Care Teams.  These Care Teams include your primary Cardiologist (physician) and Advanced Practice Providers (APPs -  Physician Assistants and Nurse Practitioners) who all work together to provide you with the care you need, when you need it. You will need a follow up appointment in:  1 years.  Please call our office 2 months in advance to schedule this appointment.  You may see Mertie Moores, MD or one of the following Advanced Practice Providers on your designated Care Team: Richardson Dopp, PA-C [place], [place] . Daune Perch, NP

## 2018-10-25 ENCOUNTER — Other Ambulatory Visit: Payer: Self-pay | Admitting: Family Medicine

## 2018-10-25 DIAGNOSIS — M7989 Other specified soft tissue disorders: Secondary | ICD-10-CM

## 2018-10-27 ENCOUNTER — Ambulatory Visit
Admission: RE | Admit: 2018-10-27 | Discharge: 2018-10-27 | Disposition: A | Discharge status: Home / Self Care | Source: Ambulatory Visit | Attending: Family Medicine | Admitting: Family Medicine

## 2018-10-27 DIAGNOSIS — M7989 Other specified soft tissue disorders: Secondary | ICD-10-CM

## 2018-11-10 ENCOUNTER — Telehealth: Payer: Self-pay

## 2018-11-10 MED ORDER — APIXABAN 5 MG PO TABS: 5.0000 mg | ORAL_TABLET | Freq: Two times a day (BID) | ORAL | 3 refills | Status: DC

## 2018-11-10 MED ORDER — PROPRANOLOL HCL 10 MG PO TABS: 10.0000 mg | ORAL_TABLET | Freq: Four times a day (QID) | ORAL | 3 refills | Status: DC | PRN

## 2018-11-10 MED ORDER — AMLODIPINE BESYLATE 5 MG PO TABS: 5.0000 mg | ORAL_TABLET | Freq: Every day | ORAL | 3 refills | Status: DC

## 2018-11-10 MED ORDER — SPIRONOLACTONE 50 MG PO TABS: 25.0000 mg | ORAL_TABLET | Freq: Every day | ORAL | 3 refills | Status: DC

## 2018-11-10 NOTE — Telephone Encounter (Signed)
Called patient to invite to PREP Class starting on 11/16/2018 Patient is agreeable  Intake scheduled for 11/11/2018 at 245pm

## 2018-12-01 NOTE — Progress Notes (Signed)
Christine Report   Patient Details  Name: [name] MRN: [id number] Date of Birth: 02-Aug-1955 Age: 63 y.o. PCP: Patient, No Pcp Per  Vitals:   12/01/18 1714  BP: (!) 148/90  Pulse: 73  Resp: 18  SpO2: 96%  Weight: 242 lb (109.8 kg)  Height: _0  (1.626 m)     [facility] Eval - 12/01/18 1700      Referral    Referring Provider  --   [facility]   Reason for referral  Obesitity/Overweight    Program Start Date  12/06/18      Measurement   Body fat  48.6 percent      Timed Up and Go (TUGS)   Timed Up and Go  Low risk <9 seconds      Mobility and Daily Activities   I find it easy to walk up or down two or more flights of stairs.  2    I have no trouble taking out the trash.  4    I do housework such as vacuuming and dusting on my own without difficulty.  4    I can easily lift a gallon of milk (8lbs).  4    I can easily walk a mile.  4    I have no trouble reaching into high cupboards or reaching down to pick up something from the floor.  4    I do not have trouble doing out-door work such as Armed forces logistics/support/administrative officer, raking leaves, or gardening.  4      Mobility and Daily Activities   I feel younger than my age.  3    I feel independent.  3    I feel energetic.  2    I live an active life.   2    I feel strong.  4    I feel healthy.  2    I feel active as other people my age.  2      How fit and strong are you.   Fit and Strong Total Score  44      Past Medical History:  Diagnosis Date  . Allergic rhinitis 04/03/2009  . Anemia    hx  . Arthritis   . Asthma, mild, intermittent 01/23/2009   Qualifier: Diagnosis of  By: Jerold Coombe - denies  . Back pain 08/03/2012  . 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 Chronic diastolic CHF (congestive heart failure) ([place]) 07/20/2015  . Depression, recurrent ([place]) 03/03/2007   Qualifier: Diagnosis of  By: Nils Pyle CMA (AAMA), Mearl Latin  -denies  . Difficult intubation    04/09/04 with difficult  intubation. unabale to pass ETT with Sabra Heck 2; unable to visualize cord with MAC 3; intubating LMA used; uneventful intubation using Glidescope 09/27/14  . Diverticulosis, with history of diverticulitis of descending and sigmoid colon 03/03/2007  . Dysrhythmia    atrial fib  . Essential hypertension 06/25/2006   Qualifier: Diagnosis of  By: Tiney Rouge CMA, Ellison Hughs    . Gastritis   . GERD 03/03/2007   Qualifier: Diagnosis of  By: Nils Pyle CMA (AAMA), Mearl Latin   Qualifier: Diagnosis of  By: Shane Crutch, [name]   . Hepatic steatosis, found on CT 03/03/2007      . Hiatal hernia, small 03/28/2016   [facility]12.   [name] History of colonic polyps 04/21/2008   Last Colonoscopy was 12/09/12, Dr. Fuller Plan. No polyp at that time. Repeat in 5 years.   [name]  Internal hemorrhoids 03/03/2007   Last Colonoscopy was 12/09/12, Dr. Fuller Plan. Repeat in 5 years.   . Left adrenal mass ([place]) 05/12/2013  . Menopausal vasomotor syndrome 03/28/2016  . Morbid obesity ([place]) (BMI 35 plus 2 comorbidities) 03/06/2016   Comorbid conditions include HTN, HLD, hepatic steatosis, OA, GERD, OSA.  . OSA (obstructive sleep apnea) 03/03/2007   [place].   [name] PAF (paroxysmal atrial fibrillation) ([place]) 04/24/2015  . PONV (postoperative nausea and vomiting)    area on left forearm DO NOT PLACE IV OR STICK ? vascular"will bleed"  . Umbilical hernia without obstruction or gangrene, fat containing 03/28/2016   Found on CT, 2017. No issues.   Past Surgical History:  Procedure Laterality Date  . ABDOMINAL HYSTERECTOMY    . APPENDECTOMY    . BREAST REDUCTION SURGERY    . BUNIONECTOMY     bilateral  . CERVICAL FUSION    . CHOLECYSTECTOMY N/A 10/01/2016   Procedure: LAPAROSCOPIC CHOLECYSTECTOMY;  Surgeon: Coralie Keens, MD;  Location: [place];  Service: General;  Laterality: N/A;  . MAXIMUM ACCESS (MAS)POSTERIOR LUMBAR INTERBODY FUSION (PLIF) 2 LEVEL N/A 09/27/2014   Procedure: Maximum Access Surgery Posterior Lumbar Interbody Fusion Lumbar  three-four, Lumbar four-five;  Surgeon: Eustace Moore, MD;  Location: [place] [facility];  Service: Neurosurgery;  Laterality: N/A;  . SPINE SURGERY    . TOTAL ABDOMINAL HYSTERECTOMY W/ BILATERAL SALPINGOOPHORECTOMY     Social History   Tobacco Use  Smoking Status Never Smoker  Smokeless Tobacco Never Used   PREP Start 12/06/2018 Schedule M/W Time 1-215pm  Barnett Hatter 12/01/2018, 5:17 PM

## 2018-12-06 NOTE — Progress Notes (Signed)
Received text from patient requesting PREP start date changed to 12/13/2018. Still recovering from arm surgery done earlier in November. Can accommodate.

## 2018-12-17 NOTE — Progress Notes (Signed)
Call place to patient about in person PREP date change to 01/09/2018.  Patient's arm is healing well and would like suggestions on beginning a home exercise program.  Offered walking outside when weather permits and also marching in place during commercials while watching TV as a start.  Offered virtual class and patient agreeable. Will begin on Monday.  Forwarding information on 'other ways to be active"

## 2018-12-22 NOTE — Progress Notes (Signed)
Attended virtual PREP class today reported out last BP as Friday: 135/67 pulse 63 Class will be M/W and will request BP check every Monday.  Tolerated exercise well.

## 2019-01-03 NOTE — Progress Notes (Signed)
Attended virtual fitness today. No complaints. Had a quiet Holiday due to Covid.  Did not rest well last night due to alarms in house needing battery changes.  Reminded about in person 01/10/2019 class however isn't keen on coming to the gym to due to covid numbers. Prefers to remain virtual right now.

## 2019-01-19 NOTE — Progress Notes (Signed)
Met online this am for 30 min of exercise and coaching. Reports a high level of stress due to recent unexpected death of friend.  BP is better controlled and attempting to eat thoughtfully.  Sleep has been disturbed.  Mindset: taught '5 to thrive' to establish habits  Encouraged to listen to podcasts/music to change moods Practiced 4/7/8 breathwork together, 2x daily Move 30 min every day Focus on real food, chewing well  Avoid junk foods  Discussed ways to use healthier salad dressings.  Will continue next week.

## 2019-01-26 NOTE — Progress Notes (Signed)
Met for virtual exercise via webex today Missed Monday due to Husband's birthday celebrations Admits to struggling making nutrition changes. Is moving more. Frustrated over weight. Needs to be below 200 lbs. Considering sleeve surgery.  Talked about ways to increase whole foods and decrease/eliminate processed foods. Avoid white carbs, white potatoes, rice and white breads, cookies, cakes. Admits salty foods are her go to. Has been under a fair bit of stress lately. Worked thru setting a goal of weight loss to 200 lbs with daily/weekly action steps. Some are: move every day for 30 min, eliminate processes foods, control salt to 1500 mg per day, eliminate high fructose corn syrup. Needs better sleep. Adding in a smoothie that husband has for breakfast.  Call ended when she received an emergency text from family member.  Next class Monday.

## 2019-02-02 NOTE — Progress Notes (Signed)
This week focus: mindset and sleep Reports still not sleeping well.  Talked about ways to induce restful sleep and rationale for prioritizing sleep.  Discussed rituals for bedtime.  Recommend bath/shower before bed, dark, cool room. Mask if TV present. Husband will wear headphones so no noise.  Top concern remains weight.  Worked out Monday and Tues, very sore today. Family member taught her to make a green smoothie. Opted to work out on own today.  Reminded to set a goal and action steps daily, work daily on habits.   Monday BP: 125/76 pulse 61 Wed BP 137/76 pulse 66

## 2019-02-07 NOTE — Progress Notes (Signed)
Met with patient online for virtual fitness Tolerated 30 min circuit without complaints Will miss Wed this week due to MD appt

## 2019-02-23 NOTE — Progress Notes (Signed)
Unable to attend today's class due to husband's upcoming surgery.  Will Engineer, building services as soon as she is able to join back.

## 2019-03-07 ENCOUNTER — Telehealth: Payer: Self-pay

## 2019-03-07 NOTE — Telephone Encounter (Signed)
Talked with patient today reference conflicts to attending virtual fitness classes. Has had multiple MD visits and has been unable to attend.  Surgery is being planned for husband as well.  Knows that she needs to attend fitness classes for wt loss and better BS control but has had tiredness even with online classes. Talked about next class for PREP on 3/9 and plans for an additional class offering at end of the month. Husbands surgery is 3/16. Encouraged her to attend online when she can and seriously consider how to attend PREP in person for weight loss and improving biometrics.

## 2019-03-21 ENCOUNTER — Telehealth: Payer: Self-pay

## 2019-03-21 NOTE — Telephone Encounter (Signed)
Text patient to check in and patient returned call.  Had an ER visit for diverticulities-had vomiting and diarrhea attributes to too many salads eaten over 4 days.  No longer vomiting or in pain but still not up to exercising.  Taking abx. Encouraged to recover and let me know when she is ready to join.  Reports weight is down to 231 due to illness.  Husband's surgery is tomorrow. Patient will contact when life settles a bit in order to rejoin exercise program.

## 2019-03-29 ENCOUNTER — Other Ambulatory Visit: Payer: Self-pay | Admitting: Gastroenterology

## 2019-03-30 NOTE — Progress Notes (Signed)
Patient was able to join in online class for about 20 min today Saw GI MD for continued abd pain after diverticulitis. No temp.  On a low fiber diet Reports a 14 lbs weight loss due to diverticulitis Reports bp yesterday at MD office 133/78 pulse 56. Has not taken BP today.  Was put on a short course of linzess to decrease abd pain and produce better bowel movements will need to have a colonoscopy soon.  Encouraged to join as she can Husband is doing well post his hip surgery but she is doing much of the house duties on her own. Will join when she can.

## 2019-04-21 ENCOUNTER — Telehealth: Payer: Self-pay | Admitting: *Deleted

## 2019-04-21 NOTE — Telephone Encounter (Signed)
   [facility] Pre-operative Risk Assessment    Request for surgical clearance:  1. What type of surgery is being performed? COLONOSCOPY   2. When is this surgery scheduled? 05/12/19   3. What type of clearance is required (medical clearance vs. Pharmacy clearance to hold med vs. Both)? BOTH  4. Are there any medications that need to be held prior to surgery and how long? ELIQUIS   5. Practice name and name of physician performing surgery? [facility]; DR. MANN   6. What is your office phone number [phone]    7.   What is your office fax number [phone]  8.   Anesthesia type (None, local, MAC, general) ? PROPOFOL    Julaine Hua 04/21/2019, 12:55 PM  _________________________________________________________________   (provider comments below)

## 2019-04-21 NOTE — Telephone Encounter (Signed)
Pharmacy, can you please comment on how long patient can hold Eliquis for upcoming colonoscopy?  Thank you! 

## 2019-04-21 NOTE — Telephone Encounter (Signed)
Patient with diagnosis of ATRIAL FIBRILLAITON on ELIQUIS for anticoagulation.    Procedure: COLONOSCOPY Date of procedure: 05/12/2019  CHADS2-VASc score of  2 (HTN, female)  CrCl 99 ML/MIN  Per office protocol, patient can hold ELIQUIS for 2 days prior to procedure.    Patient should restart ELIQUIS on the evening of procedure or day after, at discretion of procedure MD  .

## 2019-04-21 NOTE — Telephone Encounter (Signed)
   Primary Cardiologist: Mertie Moores, MD  Chart reviewed as part of pre-operative protocol coverage. Patient last seen by Dr. Acie Fredrickson in 09/2018 at which time she was doing well from a cardiac standpoint. Patient contacted today for further pre-op evaluation and reports she has continued to due well since last visit. She has lost about 15 lbs since last visit with exercise and diet. She reports mild shortness of breath with exertion but nothing significant. No chest pain or shortness of breath at rest. Palpitations have improved. No lightheadedness, dizziness, or syncope. No acute CHF symptoms. Patient able to complete >4.0 METS without any angina.  Given past medical history and time since last visit, based on ACC/AHA guidelines, [name] would be at acceptable risk for the planned procedure without further cardiovascular testing.   Per Pharmacy and office protocol, "patient can hold ELIQUIS for 2 days prior to procedure. Patient should restart ELIQUIS on the evening of procedure or day after, at discretion of procedure MD."  I will route this recommendation to the requesting party via [place] fax function and remove from pre-op pool.  Please call with questions.  Darreld Mclean, PA-C 04/21/2019, 2:18 PM

## 2019-05-09 ENCOUNTER — Other Ambulatory Visit (HOSPITAL_COMMUNITY)
Admission: RE | Admit: 2019-05-09 | Discharge: 2019-05-09 | Disposition: A | Discharge status: Home / Self Care | Source: Ambulatory Visit | Attending: Gastroenterology | Admitting: Gastroenterology

## 2019-05-09 DIAGNOSIS — Z20822 Contact with and (suspected) exposure to covid-19: Secondary | ICD-10-CM | POA: Diagnosis not present

## 2019-05-09 DIAGNOSIS — Z01812 Encounter for preprocedural laboratory examination: Secondary | ICD-10-CM | POA: Insufficient documentation

## 2019-05-09 LAB — SARS CORONAVIRUS 2 (TAT 6-24 HRS): SARS Coronavirus 2: NEGATIVE

## 2019-05-11 NOTE — Anesthesia Preprocedure Evaluation (Addendum)
Anesthesia Evaluation  Patient identified by MRN, date of birth, ID band Patient awake    Reviewed: Allergy & Precautions, NPO status , Patient's Chart, lab work & pertinent test results  History of Anesthesia Complications (+) PONV, DIFFICULT AIRWAY and history of anesthetic complications (0488 difficult intubation; has had multiple uneventful glidescope intubations since)  Airway Mallampati: II  TM Distance: >3 FB Neck ROM: Full    Dental  (+) Partial Upper   Pulmonary asthma , sleep apnea ,    Pulmonary exam normal        Cardiovascular hypertension, +CHF  Normal cardiovascular exam+ dysrhythmias Atrial Fibrillation      Neuro/Psych negative neurological ROS  negative psych ROS   GI/Hepatic Neg liver ROS, hiatal hernia, GERD  ,  Endo/Other  Morbid obesity  Renal/GU negative Renal ROS  negative genitourinary   Musculoskeletal negative musculoskeletal ROS (+)   Abdominal   Peds  Hematology negative hematology ROS (+)   Anesthesia Other Findings  Echo 2017: - Normal LVF EF 55-60%, trivial TR, normal LA size, grade 2 diastolic dysfunction.  Stress test 2017: Normal pharmacologic nuclear stress test with no evidence of prior infarct or ischemia. Normal EF (55-65%)  Reproductive/Obstetrics                            Anesthesia Physical Anesthesia Plan  ASA: III  Anesthesia Plan: MAC   Post-op Pain Management:    Induction: Intravenous  PONV Risk Score and Plan: 2 and Propofol infusion, TIVA and Treatment may vary due to age or medical condition  Airway Management Planned: Natural Airway, Nasal Cannula and Simple Face Mask  Additional Equipment: None  Intra-op Plan:   Post-operative Plan:   Informed Consent: I have reviewed the patients History and Physical, chart, labs and discussed the procedure including the risks, benefits and alternatives for the proposed anesthesia with the  patient or authorized representative who has indicated his/her understanding and acceptance.       Plan Discussed with:   Anesthesia Plan Comments:        Anesthesia Quick Evaluation

## 2019-05-12 ENCOUNTER — Ambulatory Visit (HOSPITAL_COMMUNITY): Admitting: Anesthesiology

## 2019-05-12 ENCOUNTER — Encounter (HOSPITAL_COMMUNITY)
Admission: RE | Disposition: A | Discharge status: Home / Self Care | Payer: Self-pay | Source: Home / Self Care | Attending: Gastroenterology

## 2019-05-12 ENCOUNTER — Other Ambulatory Visit: Payer: Self-pay

## 2019-05-12 ENCOUNTER — Encounter (HOSPITAL_COMMUNITY): Payer: Self-pay | Admitting: Gastroenterology

## 2019-05-12 ENCOUNTER — Ambulatory Visit (HOSPITAL_COMMUNITY)
Admission: RE | Admit: 2019-05-12 | Discharge: 2019-05-12 | Disposition: A | Discharge status: Home / Self Care | Attending: Gastroenterology | Admitting: Gastroenterology

## 2019-05-12 DIAGNOSIS — Z79899 Other long term (current) drug therapy: Secondary | ICD-10-CM | POA: Diagnosis not present

## 2019-05-12 DIAGNOSIS — I5032 Chronic diastolic (congestive) heart failure: Secondary | ICD-10-CM | POA: Insufficient documentation

## 2019-05-12 DIAGNOSIS — D124 Benign neoplasm of descending colon: Secondary | ICD-10-CM | POA: Diagnosis not present

## 2019-05-12 DIAGNOSIS — K760 Fatty (change of) liver, not elsewhere classified: Secondary | ICD-10-CM | POA: Diagnosis not present

## 2019-05-12 DIAGNOSIS — Z981 Arthrodesis status: Secondary | ICD-10-CM | POA: Insufficient documentation

## 2019-05-12 DIAGNOSIS — Z6836 Body mass index (BMI) 36.0-36.9, adult: Secondary | ICD-10-CM | POA: Insufficient documentation

## 2019-05-12 DIAGNOSIS — M1990 Unspecified osteoarthritis, unspecified site: Secondary | ICD-10-CM | POA: Insufficient documentation

## 2019-05-12 DIAGNOSIS — K5730 Diverticulosis of large intestine without perforation or abscess without bleeding: Secondary | ICD-10-CM | POA: Diagnosis not present

## 2019-05-12 DIAGNOSIS — J4520 Mild intermittent asthma, uncomplicated: Secondary | ICD-10-CM | POA: Insufficient documentation

## 2019-05-12 DIAGNOSIS — K219 Gastro-esophageal reflux disease without esophagitis: Secondary | ICD-10-CM | POA: Insufficient documentation

## 2019-05-12 DIAGNOSIS — G4733 Obstructive sleep apnea (adult) (pediatric): Secondary | ICD-10-CM | POA: Diagnosis not present

## 2019-05-12 DIAGNOSIS — Z1211 Encounter for screening for malignant neoplasm of colon: Secondary | ICD-10-CM | POA: Insufficient documentation

## 2019-05-12 DIAGNOSIS — I110 Hypertensive heart disease with heart failure: Secondary | ICD-10-CM | POA: Diagnosis not present

## 2019-05-12 DIAGNOSIS — Z7901 Long term (current) use of anticoagulants: Secondary | ICD-10-CM | POA: Insufficient documentation

## 2019-05-12 DIAGNOSIS — I480 Paroxysmal atrial fibrillation: Secondary | ICD-10-CM | POA: Diagnosis not present

## 2019-05-12 HISTORY — PX: COLONOSCOPY WITH PROPOFOL: SHX5780

## 2019-05-12 HISTORY — PX: POLYPECTOMY: SHX5525

## 2019-05-12 SURGERY — COLONOSCOPY WITH PROPOFOL
Anesthesia: Monitor Anesthesia Care

## 2019-05-12 MED ORDER — SODIUM CHLORIDE 0.9 % IV SOLN: INTRAVENOUS | Status: DC

## 2019-05-12 MED ORDER — LACTATED RINGERS IV SOLN
INTRAVENOUS | Status: DC
  Administered 2019-05-12: 07:00:00 1000 mL via INTRAVENOUS

## 2019-05-12 MED ORDER — PROPOFOL 10 MG/ML IV BOLUS
INTRAVENOUS | Status: DC | PRN
  Administered 2019-05-12: 50 mg via INTRAVENOUS
  Administered 2019-05-12 (×11): 20 mg via INTRAVENOUS

## 2019-05-12 MED ORDER — LIDOCAINE 2% (20 MG/ML) 5 ML SYRINGE
INTRAMUSCULAR | Status: DC | PRN
  Administered 2019-05-12: 40 mg via INTRAVENOUS

## 2019-05-12 SURGICAL SUPPLY — 22 items

## 2019-05-12 NOTE — H&P (Addendum)
[name] is an 64 y.o. female.   Chief Complaint: Colorectal cancer screening. HPI: [name] is a 64 year old black female with multiple medical problems listed below who is here today [facility] for colorectal cancer screening.  She had a bout of acute diverticulitis in the recent past prompting her visit to the emergency room.  See office notes for further details.  Past Medical History:  Diagnosis Date  . Allergic rhinitis 04/03/2009  . Anemia    hx  . Arthritis   . Asthma, mild, intermittent 01/23/2009   Qualifier: Diagnosis of  By: Jerold Coombe - denies  . Back pain 08/03/2012  . 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 Chronic diastolic CHF (congestive heart failure) ([place]) 07/20/2015  . Depression, recurrent ([place]) 03/03/2007   Qualifier: Diagnosis of  By: Nils Pyle CMA (AAMA), Mearl Latin  -denies  . Difficult intubation    04/09/04 with difficult intubation. unabale to pass ETT with Sabra Heck 2; unable to visualize cord with MAC 3; intubating LMA used; uneventful intubation using Glidescope 09/27/14  . Diverticulosis, with history of diverticulitis of descending and sigmoid colon 03/03/2007  . Dysrhythmia    atrial fib  . Essential hypertension 06/25/2006   Qualifier: Diagnosis of  By: Tiney Rouge CMA, Ellison Hughs    . Gastritis   . GERD 03/03/2007   Qualifier: Diagnosis of  By: Nils Pyle CMA (AAMA), Mearl Latin   Qualifier: Diagnosis of  By: Shane Crutch, [name]   . Hepatic steatosis, found on CT 03/03/2007      . Hiatal hernia, small 03/28/2016   E[facility] 12/09/12.   [name] History of colonic polyps 04/21/2008   Last Colonoscopy was 12/09/12, Dr. Fuller Plan. No polyp at that time. Repeat in 5 years.   . Internal hemorrhoids 03/03/2007   Last Colonoscopy was 12/09/12, Dr. Fuller Plan. Repeat in 5 years.   . Left adrenal mass ([place]) 05/12/2013  . Menopausal vasomotor syndrome 03/28/2016  . Morbid obesity ([place]) (BMI 35 plus 2 comorbidities) 03/06/2016   Comorbid conditions include HTN, HLD,  hepatic steatosis, OA, GERD, OSA.  . OSA (obstructive sleep apnea) 03/03/2007   [place].   [name] PAF (paroxysmal atrial fibrillation) ([place]) 04/24/2015  . PONV (postoperative nausea and vomiting)    area on left forearm DO NOT PLACE IV OR STICK ? vascular"will bleed"  . Umbilical hernia without obstruction or gangrene, fat containing 03/28/2016   Found on CT, 2017. No issues.    Past Surgical History:  Procedure Laterality Date  . ABDOMINAL HYSTERECTOMY    . APPENDECTOMY    . BREAST REDUCTION SURGERY    . BUNIONECTOMY     bilateral  . CERVICAL FUSION    . CHOLECYSTECTOMY N/A 10/01/2016   Procedure: LAPAROSCOPIC CHOLECYSTECTOMY;  Surgeon: Coralie Keens, MD;  Location: [place];  Service: General;  Laterality: N/A;  . MAXIMUM ACCESS (MAS)POSTERIOR LUMBAR INTERBODY FUSION (PLIF) 2 LEVEL N/A 09/27/2014   Procedure: Maximum Access Surgery Posterior Lumbar Interbody Fusion Lumbar three-four, Lumbar four-five;  Surgeon: Eustace Moore, MD;  Location: [place] [facility];  Service: Neurosurgery;  Laterality: N/A;  . SPINE SURGERY    . TOTAL ABDOMINAL HYSTERECTOMY W/ BILATERAL SALPINGOOPHORECTOMY     Family History  Problem Relation Age of Onset  . Diabetes Mother   . Hypertension Mother   . Coronary artery disease Sister   . Hypertension Sister   . Colon cancer Neg Hx    Social History:  reports that she has never smoked. She has never used smokeless  tobacco. She reports that she does not drink alcohol or use drugs.  Allergies:  Allergies  Allergen Reactions  . Other Other (See Comments)    ekg pads burns skin needs hypoallergenic  . Losartan Other (See Comments)    Chest pain.   . Tramadol Other (See Comments)    GI upset, "I felt like I was in a fog"   Medications Prior to Admission  Medication Sig Dispense Refill  . acetaminophen (TYLENOL) 500 MG tablet Take 500-1,000 mg by mouth every 6 (six) hours as needed for mild pain or moderate pain (arthritis).    [name] amLODipine (NORVASC) 5  MG tablet Take 1 tablet (5 mg total) by mouth daily. 90 tablet 3  . apixaban (ELIQUIS) 5 MG TABS tablet Take 1 tablet (5 mg total) by mouth 2 (two) times daily. 180 tablet 3  . Apoaequorin (PREVAGEN PO) Take 1 tablet by mouth daily.    . Calcium Carbonate-Vitamin D (CALCIUM 600+D) 600-400 MG-UNIT per tablet Take 1 tablet by mouth daily.     . Cholecalciferol (VITAMIN D3) 50 MCG (2000 UT) TABS Take 2,000 Units by mouth daily.    [name] estradiol (VIVELLE-DOT) 0.05 MG/24HR patch Place 1 patch onto the skin 2 (two) times a week.    . Magnesium 250 MG TABS Take 250 mg by mouth daily.    . metoprolol succinate (TOPROL-XL) 25 MG 24 hr tablet Take 1 tablet (25 mg total) by mouth daily. 90 tablet 3  . Multiple Vitamin (MULTIVITAMIN WITH MINERALS) TABS tablet Take 1 tablet by mouth daily. Alive    . Probiotic Product (ULTRAFLORA IMMUNE HEALTH PO) Take 1 tablet by mouth daily.    . propranolol (INDERAL) 10 MG tablet Take 1 tablet (10 mg total) by mouth 4 (four) times daily as needed. (Patient taking differently: Take 10 mg by mouth daily as needed (Afib). ) 360 tablet 3  . spironolactone (ALDACTONE) 50 MG tablet Take 0.5 tablets (25 mg total) by mouth daily. (Patient not taking: Reported on 05/02/2019) 45 tablet 3   Review of Systems  Constitutional: Negative.   HENT: Negative.   Eyes: Negative.   Respiratory: Negative.   Gastrointestinal: Negative.   Endocrine: Negative.   Genitourinary: Negative.   Musculoskeletal: Negative for back pain.  Allergic/Immunologic: Negative.   Neurological: Negative.   Hematological: Negative.   Psychiatric/Behavioral: Negative.    Blood pressure (!) 146/97, temperature 98.2 F (36.8 C), temperature source Oral, height _0  (1.651 m), weight 100.2 kg, SpO2 97 %. Physical Exam  Constitutional: She is oriented to person, place, and time. She appears well-developed and well-nourished.  HENT:  Head: Normocephalic and atraumatic.  Eyes: Pupils are equal, round, and  reactive to light. Conjunctivae and EOM are normal.  Cardiovascular: Normal rate and regular rhythm.  Respiratory: Effort normal and breath sounds normal.  GI: Soft. Bowel sounds are normal.  Musculoskeletal:     Cervical back: Normal range of motion and neck supple.  Neurological: She is alert and oriented to person, place, and time.  Skin: Skin is warm.  Psychiatric: She has a normal mood and affect. Her behavior is normal. Judgment and thought content normal.    Assessment/Plan Colorectal cancer screening/diverticulosis-patient has held her Eliquis 2 days prior to the procedure.  Further recommendation made after colonoscopy has been done  Juanita Craver, MD 05/12/2019, 7:12 AM

## 2019-05-12 NOTE — Transfer of Care (Signed)
Immediate Anesthesia Transfer of Care Note  Patient: [name]  Procedure(s) Performed: COLONOSCOPY WITH PROPOFOL (N/A ) POLYPECTOMY  Patient Location: PACU and Endoscopy Unit  Anesthesia Type:MAC  Level of Consciousness: sedated  Airway & Oxygen Therapy: Patient Spontanous Breathing and Patient connected to face mask  Post-op Assessment: Report given to RN and Post -op Vital signs reviewed and stable  Post vital signs: Reviewed and stable  Last Vitals:  Vitals Value Taken Time  BP 114/54 05/12/19 0806  Temp    Pulse 64 05/12/19 0807  Resp 18 05/12/19 0807  SpO2 99 % 05/12/19 0807  Vitals shown include unvalidated device data.  Last Pain:  Vitals:   05/12/19 0629  TempSrc: Oral  PainSc:          Complications: No apparent anesthesia complications

## 2019-05-12 NOTE — Anesthesia Postprocedure Evaluation (Signed)
Anesthesia Post Note  Patient: [name]  Procedure(s) Performed: COLONOSCOPY WITH PROPOFOL (N/A ) POLYPECTOMY     Patient location during evaluation: Endoscopy Anesthesia Type: MAC Level of consciousness: awake and alert Pain management: pain level controlled Vital Signs Assessment: post-procedure vital signs reviewed and stable Respiratory status: spontaneous breathing, nonlabored ventilation and respiratory function stable Cardiovascular status: blood pressure returned to baseline and stable Postop Assessment: no apparent nausea or vomiting Anesthetic complications: no    Last Vitals:  Vitals:   05/12/19 0810 05/12/19 0820  BP: (!) 127/53 (!) 125/57  Pulse: (!) 55 (!) 54  Resp: 19 13  Temp:    SpO2: 99% 98%    Last Pain:  Vitals:   05/12/19 0820  TempSrc:   PainSc: 0-No pain                 Lidia Collum

## 2019-05-12 NOTE — Discharge Instructions (Signed)

## 2019-05-12 NOTE — Op Note (Addendum)
[facility] Patient Name: [name] Procedure Date: 05/12/2019 MRN: [id number] Attending MD: Juanita Craver , MD Date of Birth: Sep 30, 1955 CSN: [id number] Age: 64 Admit Type: Outpatient Procedure:                Colonoscopy with cold snare polypectomy x 1. Indications:              CRC screening for colorectal malignant neoplasm. Providers:                Juanita Craver, MD, Cleda Daub, RN, Cherylynn Ridges,                            Technician, Glenis Smoker, CRNA. Referring MD:             London Pepper, MD Medicines:                Monitored Anesthesia Care Complications:            No immediate complications. Estimated Blood Loss:     Estimated blood loss was minimal. Procedure:                Pre-Anesthesia Assessment: - Prior to the                            procedure, a history and physical was performed,                            and patient medications and allergies were                            reviewed. The patient's tolerance of previous                            anesthesia was also reviewed. The risks and                            benefits of the procedure and the sedation options                            and risks were discussed with the patient. All                            questions were answered, and informed consent was                            obtained. Prior Anticoagulants: The patient has                            taken Eliquis (apixaban), last dose was 2 days                            prior to procedure. ASA Grade Assessment: III - A                            patient with severe systemic disease. After  reviewing the risks and benefits, the patient was                            deemed in satisfactory condition to undergo the                            procedure. After obtaining informed consent, the                            colonoscope was passed under direct vision.                            Throughout  the procedure, the patient's blood                            pressure, pulse, and oxygen saturations were                            monitored continuously. The CF-HQ190L ([id number])                            Olympus colonoscope was introduced through the anus                            and advanced to the the cecum, identified by                            appendiceal orifice and ileocecal valve. The                            colonoscopy was performed without difficulty. The                            patient tolerated the procedure well. The quality                            of the bowel preparation was good. The bowel                            preparation used was SUPREP via split dose                            instruction. Scope In: 7:41:56 AM Scope Out: 7:57:57 AM Scope Withdrawal Time: 0 hours 7 minutes 0 seconds  Total Procedure Duration: 0 hours 16 minutes 1 second  Findings:      Scattered small and large-mouthed diverticula were found in the entire       colon.      A 6-7 mm sessile polyp was found in the proximal sigmoid colon; the       polyp was removed with a cold snare x 1; resection and retrieval were       complete.      Lipoma noted at the hepatic flexure-identified by its yellowish       submucosal appearance.      The exam was otherwise without  abnormality on direct and retroflexion       views. Impression:               - Diverticulosis in the entire examined colon.                           - One 6-7 mm sessle polyp in the proximal sigmoid                            colon, removed with a cold snare x 1; resected and                            retrieved.                           - Lipoma noted at the hepatic flexure.                           - The examination was otherwise normal on direct                            and retroflexion views. Moderate Sedation:      MAC used. Recommendation:           - High fiber, low fat diet with augmented water                             consumption daily.                           - Continue present medications; avoid the use of                            all NSAIDS.                           - Await pathology results.                           - Repeat colonoscopy in 7-10 years for surveillance.                           - Return to GI office PRN.                           - If the patient has any abnormal GI symptoms in                            the interim, she has been advised to call the                            office ASAP for further recommendations. Procedure Code(s):        --- Professional ---                           [phone], Colonoscopy, flexible; with removal of  tumor(s), polyp(s), or other lesion(s) by snare                            technique Diagnosis Code(s):        --- Professional ---                           D12.4, Benign neoplasm of descending colon                           K57.30, Diverticulosis of large intestine without                            perforation or abscess without bleeding                           Z12.11, Encounter for screening for malignant                            neoplasm of colon CPT copyright 2019 [employer]. All rights reserved. The codes documented in this report are preliminary and upon coder review may  be revised to meet current compliance requirements. Juanita Craver, MD Juanita Craver, MD 05/12/2019 8:11:20 AM This report has been signed electronically. Number of Addenda: 0

## 2019-05-12 NOTE — Anesthesia Procedure Notes (Signed)
Procedure Name: MAC Date/Time: 05/12/2019 7:29 AM Performed by: Cynda Familia, CRNA Pre-anesthesia Checklist: Patient identified, Emergency Drugs available, Suction available, Patient being monitored and Timeout performed Patient Re-evaluated:Patient Re-evaluated prior to induction Oxygen Delivery Method: Simple face mask Placement Confirmation: breath sounds checked- equal and bilateral and positive ETCO2 Dental Injury: Teeth and Oropharynx as per pre-operative assessment

## 2019-05-13 ENCOUNTER — Encounter: Payer: Self-pay | Admitting: *Deleted

## 2019-05-13 LAB — SURGICAL PATHOLOGY

## 2019-06-13 ENCOUNTER — Encounter: Payer: Self-pay | Admitting: Family Medicine

## 2019-09-02 IMAGING — DX DG CHEST 2V
2 series · 2 of 2 positions shown · non-contrast
Comparison: 09/04/2017

CLINICAL DATA: Cough and nasal congestion, 4 days duration.

EXAM:
CHEST - 2 VIEW

[chest pa]
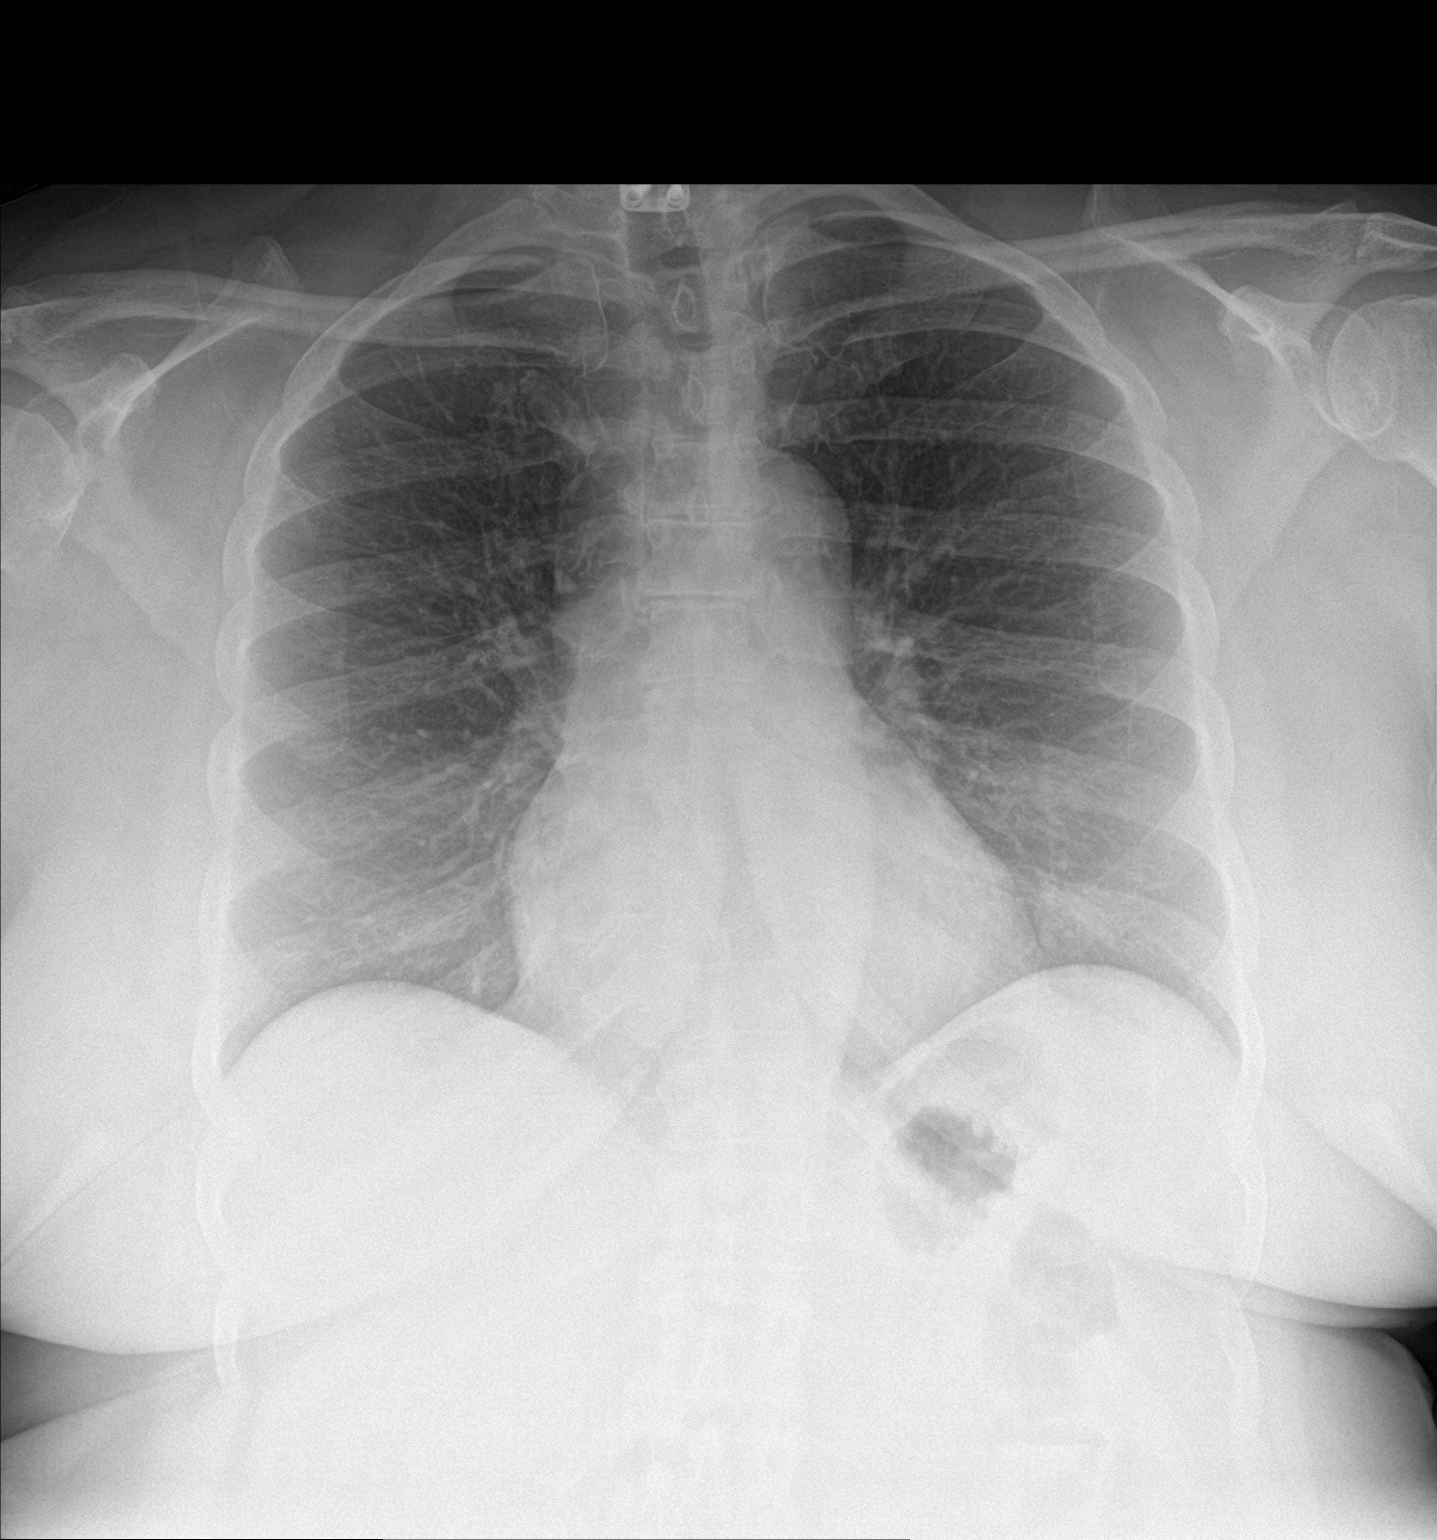

[chest lat]
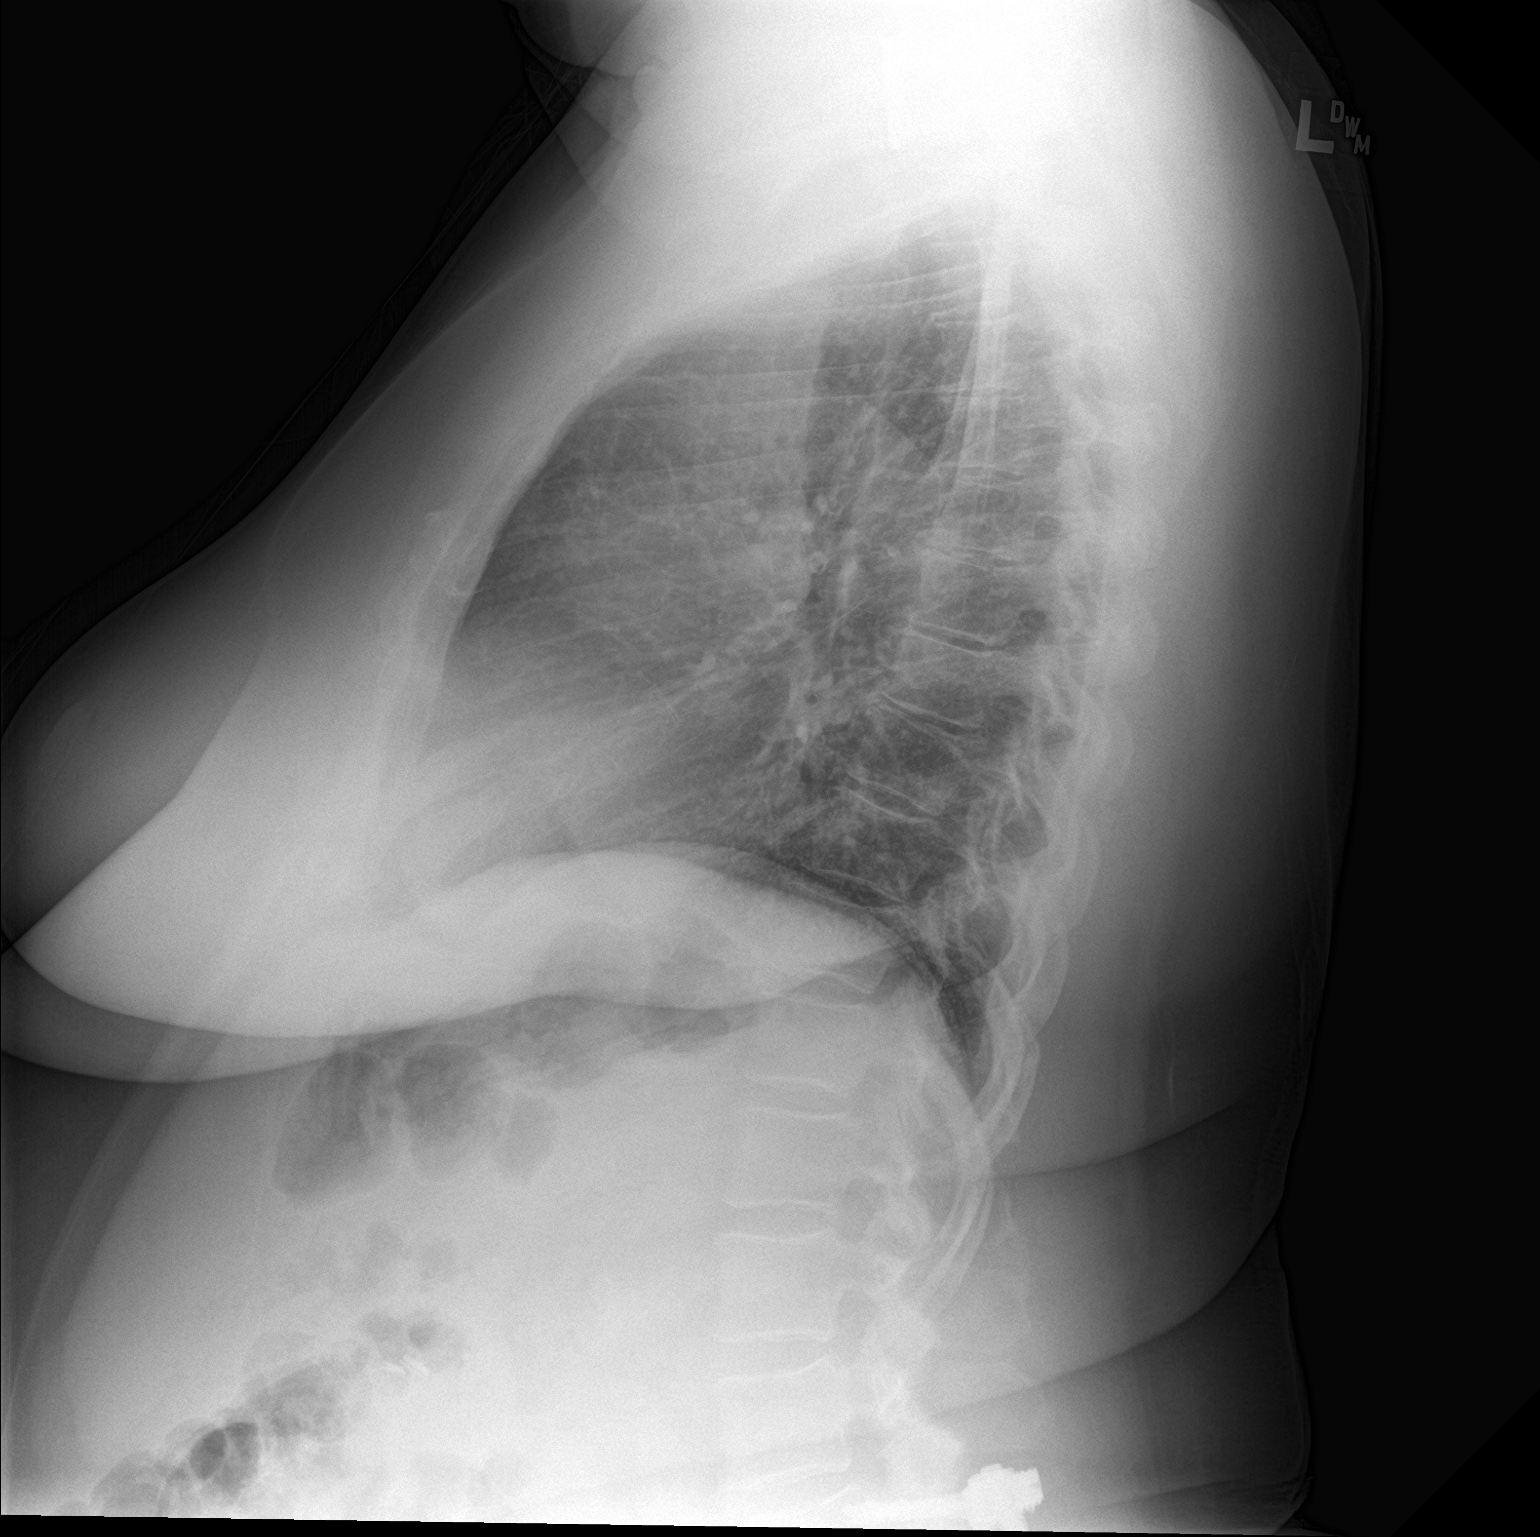

[2 of 2 positions shown; findings below may reference images not displayed]

FINDINGS: Heart size is normal. Mediastinal shadows are normal. The lungs are
clear. No bronchial thickening. No infiltrate, mass, effusion or
collapse. Pulmonary vascularity is normal. No bony abnormality.
IMPRESSION: Normal chest

## 2019-09-18 ENCOUNTER — Encounter: Payer: Self-pay | Admitting: Cardiovascular Disease

## 2019-09-18 NOTE — Progress Notes (Signed)
Cardiology Office Note   Date:  09/19/2019   ID:  [name], [name] 09-24-1955, MRN [id number]  PCP:  London Pepper, MD  Cardiologist:   Mertie Moores, MD   Chief Complaint  Patient presents with  . Atrial Fibrillation  . Congestive Heart Failure   Problem List 1. Atrial fib - This patients CHA2DS2-VASc Score and unadjusted Ischemic Stroke Rate (% per year) is equal to 2.2 % stroke rate/year from a score of 2  Above score calculated as 1 point each if present [CHF, HTN, DM, Vascular=MI/PAD/Aortic Plaque, Age if 65-74, or Female] Above score calculated as 2 points each if present [Age > 75, or Stroke/TIA/TE]  2. Essential HTN 3. Grade 2 diastolic dysfunction  - normal LV function 4. Obstructive sleep apnea -   [name] is a 64 y.o. female who presents for atrial fib, She was seen in the ER with PAF Has had a dull head ache.   Echo shows normal LV systolic function Myoview shows no ischemia   July 20, 2015:  [name] is seen today for follow up visit for her paroxysmal atrial fib.  Has had 1 episode of palpitations - likely was PAF - related to stress.  Is watching the salt in her diet .  The Xarelto seems to be upsetting her stomach  Held it for several days and her stomach started bothering her again  March 06, 2016:  Doing well  Having some back pain  Had an episode of atrial fib 3 nights ago.   Work her up . Has OAS - uses CPAP but she had pulled it off. She put the CPAP mask back on   09/15/2016: [name] is seen back for follow-up evaluation. Wt is 228 ( down 9 lbs from last visit )  Has been having more palpitations.  Took some extra metoprolol which seems    Has not been using her CPAP because her mask has lots of condensation and drains back into there mask .  Has been tried on Lisinopril ( caused CP and cough) and was on Valsartan ( was recalled )  Doing well on the [facility]   March 18, 2017:   [name] is seen back today for follow-up of  her chronic diastolic congestive heart failure, paroxysmal atrial fibrillation Has had difficult losing weight Has night sweats - possbily due to menapause  BP is a little elevated.   Under lots of stress  - sister is in the hospital ( heavy smoker)  No dyspnea. Still has occasional palpitations - took an extra propranolol which helps   June 17, 2017:  [name] is seen back for an early work in visit Was seen by Dr. Martinique a month ago.  She was having episodes of chest pain and an elevated blood pressure. She had been seen in the emergency room complaining of chest pain and left arm pain.  Troponin levels were normal. Dr. Doug Sou opinion was that the chest pain was related to her elevated blood pressure.  She has had a normal Myoview study 2 years ago.  The plan is to repeat the Myoview study if she has recurrent symptoms.  BP is still  Moderately elevated  She has cut back on her salt.    Has seen a nutritionist.      August 03, 2017: [name] is seen today for follow-up of her hypertension and chronic diastolic congestive heart failure. BP readings have been elevated.  Has been to the [facility]  she had some Hibiscus Tea yesterday  -  BP is normal today   Sept. 5, 2019: [name] is seen today for follow-up of her hypertension and chronic diastolic congestive heart failure.  She was recently seen in the emergency room for paroxysmal atrial fibrillation.  She has a history of obstructive sleep apnea but has not been wearing her CPAP mask.  Episode of A flutter  - woke her up at 5 am.   Sent to the ER  Aflutter resolved with IV cardiazem   February 01, 2018: [name] was seen in the emergency room in December, 2019.   Th[facility] to [facility]itis[facility]  . Pressure is been well controlled. Received lots of meds , steroids. Is just now getting better .  Is wearing her CPAP  BP is well controlled.  Has  gained some weight Wt today is 231 lbs    ( weight in Sept. 2019 was 225)   Is on a [facility] .   Sept.  28, 2020   [name] is seen today for follow up of her PAF and HTN  Wt.239 lbs ( up 8 lbs since last year )  Was going to a trainer  But has gained wt with covid .  No dyspnea.  No cp,     Sept. 13, 2021 [name] is seen for follow up of her PAF, HTN Wt = 220 ( down 19 lbs)    - was 239 last year Has developed an injury of her left hip   Has had her covid vaccines ( 2)   Her left hip pain is causing her to not sleep well at night . Has had more episodes of Atib.  Uses her CPAP    Past Medical History:  Diagnosis Date  . Allergic rhinitis 04/03/2009  . Anemia    hx  . Arthritis   . Asthma, mild, intermittent 01/23/2009   Qualifier: Diagnosis of  By: Jerold Coombe - denies  . Back pain 08/03/2012  . 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 Chronic diastolic CHF (congestive heart failure) ([place]) 07/20/2015  . Depression, recurrent ([place]) 03/03/2007   Qualifier: Diagnosis of  By: Nils Pyle CMA (AAMA), Mearl Latin  -denies  . Difficult intubation    04/09/04 with difficult intubation. unabale to pass ETT with Sabra Heck 2; unable to visualize cord with MAC 3; intubating LMA used; uneventful intubation using Glidescope 09/27/14  . Diverticulosis, with history of diverticulitis of descending and sigmoid colon 03/03/2007  . Dysrhythmia    atrial fib  . Essential hypertension 06/25/2006   Qualifier: Diagnosis of  By: Tiney Rouge CMA, Ellison Hughs    . Gastritis   . GERD 03/03/2007   Qualifier: Diagnosis of  By: Nils Pyle CMA (AAMA), Mearl Latin   Qualifier: Diagnosis of  By: Shane Crutch, [name]   . Hepatic steatosis, found on CT 03/03/2007      . Hiatal hernia, small 03/28/2016   EGD by [facility] 12/09/12.   [name] History of colonic polyps 04/21/2008   Last Colonoscopy was 12/09/12, Dr. Fuller Plan. No polyp at that time. Repeat in 5 years.   . Internal hemorrhoids 03/03/2007    Last Colonoscopy was 12/09/12, Dr. Fuller Plan. Repeat in 5 years.   . Left adrenal mass ([place]) 05/12/2013  . Menopausal vasomotor syndrome 03/28/2016  . Morbid obesity (HCC) (BMI 35 plus 2 comorbidities) 03/06/2016   Comorbid conditions include HTN, HLD, hepatic  steatosis, OA, GERD, OSA.  . OSA (obstructive sleep apnea) 03/03/2007   [place].   [name] PAF (paroxysmal atrial fibrillation) ([place]) 04/24/2015  . PONV (postoperative nausea and vomiting)    area on left forearm DO NOT PLACE IV OR STICK ? vascular"will bleed"  . Umbilical hernia without obstruction or gangrene, fat containing 03/28/2016   Found on CT, 2017. No issues.    Past Surgical History:  Procedure Laterality Date  . ABDOMINAL HYSTERECTOMY    . APPENDECTOMY    . BREAST REDUCTION SURGERY    . BUNIONECTOMY     bilateral  . CERVICAL FUSION    . CHOLECYSTECTOMY N/A 10/01/2016   Procedure: LAPAROSCOPIC CHOLECYSTECTOMY;  Surgeon: Coralie Keens, MD;  Location: [facility];  Service: General;  Laterality: N/A;  . COLONOSCOPY WITH PROPOFOL N/A 05/12/2019   Procedure: COLONOSCOPY WITH PROPOFOL;  Surgeon: Juanita Craver, MD;  Location: [facility];  Service: Endoscopy;  Laterality: N/A;  . MAXIMUM ACCESS (MAS)POSTERIOR LUMBAR INTERBODY FUSION (PLIF) 2 LEVEL N/A 09/27/2014   Procedure: Maximum Access Surgery Posterior Lumbar Interbody Fusion Lumbar three-four, Lumbar four-five;  Surgeon: Eustace Moore, MD;  Location: [facility];  Service: Neurosurgery;  Laterality: N/A;  . POLYPECTOMY  05/12/2019   Procedure: POLYPECTOMY;  Surgeon: Juanita Craver, MD;  Location: [facility]COPY;  Service: Endoscopy;;  . SPINE SURGERY    . TOTAL ABDOMINAL HYSTERECTOMY W/ BILATERAL SALPINGOOPHORECTOMY       Current Outpatient Medications  Medication Sig Dispense Refill  . acetaminophen (TYLENOL) 500 MG tablet Take 500-1,000 mg by mouth every 6 (six) hours as needed for mild pain or moderate pain (arthritis).    [name] amLODipine (NORVASC) 5 MG tablet Take 1 tablet (5  mg total) by mouth daily. 90 tablet 3  . apixaban (ELIQUIS) 5 MG TABS tablet Take 1 tablet (5 mg total) by mouth 2 (two) times daily. 180 tablet 3  . Calcium Carbonate-Vitamin D (CALCIUM 600+D) 600-400 MG-UNIT per tablet Take 1 tablet by mouth daily.     . Cholecalciferol (VITAMIN D3) 50 MCG (2000 UT) TABS Take 2,000 Units by mouth daily.    . Magnesium 250 MG TABS Take 250 mg by mouth daily.    . metoprolol succinate (TOPROL-XL) 25 MG 24 hr tablet Take 1 tablet (25 mg total) by mouth daily. 90 tablet 3  . Multiple Vitamin (MULTIVITAMIN WITH MINERALS) TABS tablet Take 1 tablet by mouth daily. Alive    . Probiotic Product (ULTRAFLORA IMMUNE HEALTH PO) Take 1 tablet by mouth daily.    . propranolol (INDERAL) 10 MG tablet Take 1 tablet (10 mg total) by mouth 4 (four) times daily as needed. 360 tablet 3   No current facility-administered medications for this visit.    Allergies:   Other, Losartan, and Tramadol    Social History:  The patient  reports that she has never smoked. She has never used smokeless tobacco. She reports that she does not drink alcohol and does not use drugs.   Family History:  The patient's family history includes Coronary artery disease in her sister; Diabetes in her mother; Hypertension in her mother and sister.    ROS:  Please see the history of present illness.    Physical Exam: Blood pressure 128/76, pulse 60, height _0  (1.651 m), weight 220 lb (99.8 kg), SpO2 97 %.  GEN: Middle-aged female, moderately obese.  No acute distress HEENT: Normal NECK: No JVD; No carotid bruits LYMPHATICS: No lymphadenopathy CARDIAC: RRR , no murmurs, rubs, gallops RESPIRATORY:  Clear to auscultation without rales, wheezing or rhonchi  ABDOMEN: Soft, non-tender, non-distended MUSCULOSKELETAL:  No edema; No deformity  SKIN: Warm and dry NEUROLOGIC:  Alert and oriented x 3   EKG: September 19, 2019: Normal sinus rhythm.  She has nonspecific ST and T wave  abnormalities.   Recent Labs: 10/01/2018: ALT 24; BUN 8; Creatinine, Ser 1.01; Potassium 4.8; Sodium 141    Lipid Panel    Component Value Date/Time   CHOL 139 10/01/2018 0900   TRIG 124 10/01/2018 0900   HDL 42 10/01/2018 0900   CHOLHDL 3.3 10/01/2018 0900   CHOLHDL 3 04/22/2016 1234   VLDL 20.6 04/22/2016 1234   LDLCALC 75 10/01/2018 0900      Wt Readings from Last 3 Encounters:  09/19/19 220 lb (99.8 kg)  05/12/19 221 lb (100.2 kg)  01/26/19 240 lb (108.9 kg)      Other studies Reviewed: Additional studies/ records that were reviewed today include: . Review of the above records demonstrates:    ASSESSMENT AND PLAN:  1.  Paroxysmal atrial fibrillation:      She remains in normal sinus rhythm.  Continue Eliquis.  2.   Essential hypertension:     Blood pressures well controlled.  3.    Chronic diastolic congestive heart failure:   She is not having any significant episodes of shortness of breath.  Continue with weight loss efforts.  She is injured her left hip trying to walk too hard/too fast.  4.  Chest pain:    5.  Obesity:   She is lost 19 pounds since her last visit.  I congratulated her on her weight loss.  We will continue to follow.   Current medicines are reviewed at length with the patient today.  The patient does not have concerns regarding medicines.  Labs/ tests ordered today include:   Orders Placed This Encounter  Procedures  . EKG 12-Lead    Disposition:   FU with me in  1 year      Mertie Moores, MD  09/19/2019 4:07 PM    [facility] [address], [place], [place]  [address] Phone: [phone]; Fax: [phone]

## 2019-09-19 ENCOUNTER — Encounter: Payer: Self-pay | Admitting: Cardiovascular Disease

## 2019-09-19 ENCOUNTER — Ambulatory Visit (INDEPENDENT_AMBULATORY_CARE_PROVIDER_SITE_OTHER): Admitting: Cardiovascular Disease

## 2019-09-19 ENCOUNTER — Other Ambulatory Visit: Payer: Self-pay

## 2019-09-19 VITALS — BP 128/76 | HR 60 | Ht 65.0 in | Wt 220.0 lb

## 2019-09-19 DIAGNOSIS — I10 Essential (primary) hypertension: Secondary | ICD-10-CM

## 2019-09-19 DIAGNOSIS — I480 Paroxysmal atrial fibrillation: Secondary | ICD-10-CM

## 2019-09-19 NOTE — Patient Instructions (Signed)
Medication Instructions:  Your physician recommends that you continue on your current medications as directed. Please refer to the Current Medication list given to you today.  *If you need a refill on your cardiac medications before your next appointment, please call your pharmacy*   Lab Work: None  If you have labs (blood work) drawn today and your tests are completely normal, you will receive your results only by: . MyChart Message (if you have MyChart) OR . A paper copy in the mail If you have any lab test that is abnormal or we need to change your treatment, we will call you to review the results.   Testing/Procedures: None   Follow-Up: At [facility], you and your health needs are our priority.  As part of our continuing mission to provide you with exceptional heart care, we have created designated Provider Care Teams.  These Care Teams include your primary Cardiologist (physician) and Advanced Practice Providers (APPs -  Physician Assistants and Nurse Practitioners) who all work together to provide you with the care you need, when you need it.  We recommend signing up for the patient portal called "MyChart".  Sign up information is provided on this After Visit Summary.  MyChart is used to connect with patients for Virtual Visits (Telemedicine).  Patients are able to view lab/test results, encounter notes, upcoming appointments, etc.  Non-urgent messages can be sent to your provider as well.   To learn more about what you can do with MyChart, go to [email or web address].    Your next appointment:   12 month(s)  The format for your next appointment:   In Person  Provider:   You may see Philip Nahser, MD or one of the following Advanced Practice Providers on your designated Care Team:    Scott Weaver, PA-C  Vin Bhagat, PA-C    Other Instructions None  

## 2019-10-10 ENCOUNTER — Ambulatory Visit: Payer: PRIVATE HEALTH INSURANCE | Admitting: Cardiovascular Disease

## 2019-10-19 ENCOUNTER — Other Ambulatory Visit: Payer: Self-pay

## 2019-10-19 MED ORDER — METOPROLOL SUCCINATE ER 25 MG PO TB24: 25.0000 mg | ORAL_TABLET | Freq: Every day | ORAL | 3 refills | Status: DC

## 2019-10-19 MED ORDER — AMLODIPINE BESYLATE 5 MG PO TABS: 5.0000 mg | ORAL_TABLET | Freq: Every day | ORAL | 3 refills | Status: DC

## 2019-10-19 MED ORDER — APIXABAN 5 MG PO TABS: 5.0000 mg | ORAL_TABLET | Freq: Two times a day (BID) | ORAL | 1 refills | Status: DC

## 2019-10-19 NOTE — Telephone Encounter (Signed)
Pt's medication was sent to pt's pharmacy as requested. Confirmation received. Pt also needs a refill on Eliquis. Please address

## 2019-10-19 NOTE — Telephone Encounter (Signed)
afib indication, last visit sept 2021, age 65, weight 100kg, scr 0.87 on 09/01/19 at [facility]

## 2019-11-20 ENCOUNTER — Emergency Department (INDEPENDENT_AMBULATORY_CARE_PROVIDER_SITE_OTHER)
Admission: EM | Admit: 2019-11-20 | Discharge: 2019-11-20 | Disposition: A | Discharge status: Home / Self Care | Source: Home / Self Care

## 2019-11-20 ENCOUNTER — Encounter: Payer: Self-pay | Admitting: Emergency Medicine

## 2019-11-20 ENCOUNTER — Other Ambulatory Visit: Payer: Self-pay

## 2019-11-20 DIAGNOSIS — R430 Anosmia: Secondary | ICD-10-CM

## 2019-11-20 DIAGNOSIS — R432 Parageusia: Secondary | ICD-10-CM | POA: Diagnosis not present

## 2019-11-20 DIAGNOSIS — J069 Acute upper respiratory infection, unspecified: Secondary | ICD-10-CM

## 2019-11-20 MED ORDER — BENZONATATE 100 MG PO CAPS: 100.0000 mg | ORAL_CAPSULE | Freq: Three times a day (TID) | ORAL | 0 refills | Status: DC

## 2019-11-20 MED ORDER — IPRATROPIUM BROMIDE 0.06 % NA SOLN
2.0000 | Freq: Four times a day (QID) | NASAL | 1 refills | Status: DC
Start: 2019-11-20 — End: 2020-01-30

## 2019-11-20 NOTE — ED Provider Notes (Signed)
[name] C[facility]    CSN: [id number] Arrival date & time: 11/20/19  1347      History   Chief Complaint Chief Complaint  Patient presents with  . Cough  . Nasal Congestion    HPI [name] is a 64 y.o. female.   HPI  [name] is a 64 y.o. female presenting to UC with c/o 4 days of worsening congestion and now loss of taste and smell since this morning.  She went to the beach last week and notes another family member has been sick for a few days prior.  Both pt and family member have been vaccinated but pt's last dose was Pfizer COVID vaccine in May.  Has not received a booster. Requesting COVID test. Denies chest pain or SOB.    Past Medical History:  Diagnosis Date  . Allergic rhinitis 04/03/2009  . Anemia    hx  . Arthritis   . Asthma, mild, intermittent 01/23/2009   Qualifier: Diagnosis of  By: Jerold Coombe - denies  . Back pain 08/03/2012  . 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 Chronic diastolic CHF (congestive heart failure) ([place]) 07/20/2015  . Depression, recurrent ([place]) 03/03/2007   Qualifier: Diagnosis of  By: Nils Pyle CMA (AAMA), Mearl Latin  -denies  . Difficult intubation    04/09/04 with difficult intubation. unabale to pass ETT with Sabra Heck 2; unable to visualize cord with MAC 3; intubating LMA used; uneventful intubation using Glidescope 09/27/14  . Diverticulosis, with history of diverticulitis of descending and sigmoid colon 03/03/2007  . Dysrhythmia    atrial fib  . Essential hypertension 06/25/2006   Qualifier: Diagnosis of  By: Tiney Rouge CMA, Ellison Hughs    . Gastritis   . GERD 03/03/2007   Qualifier: Diagnosis of  By: Nils Pyle CMA (AAMA), Mearl Latin   Qualifier: Diagnosis of  By: Shane Crutch, [name]   . Hepatic steatosis, found on CT 03/03/2007      . Hiatal hernia, small 03/28/2016   EGD by [facility] 12/09/12.   [name] History of colonic polyps 04/21/2008   Last Colonoscopy was 12/09/12, Dr. Fuller Plan. No polyp at that time. Repeat in 5 years.   .  Internal hemorrhoids 03/03/2007   Last Colonoscopy was 12/09/12, Dr. Fuller Plan. Repeat in 5 years.   . Left adrenal mass ([place]) 05/12/2013  . Menopausal vasomotor syndrome 03/28/2016  . Morbid obesity ([place]) (BMI 35 plus 2 comorbidities) 03/06/2016   Comorbid conditions include HTN, HLD, hepatic steatosis, OA, GERD, OSA.  . OSA (obstructive sleep apnea) 03/03/2007   [place].   [name] PAF (paroxysmal atrial fibrillation) ([place]) 04/24/2015  . PONV (postoperative nausea and vomiting)    area on left forearm DO NOT PLACE IV OR STICK ? vascular"will bleed"  . Umbilical hernia without obstruction or gangrene, fat containing 03/28/2016   Found on CT, 2017. No issues.    Patient Active Problem List   Diagnosis Date Noted  . Biliary dyskinesia 10/01/2016  . History of hysterectomy 03/28/2016  . History of appendectomy 03/28/2016  . Umbilical hernia without obstruction or gangrene, fat containing 03/28/2016  . History of fusion of cervical spine 03/28/2016  . Hiatal hernia, small 03/28/2016  . Menopausal vasomotor syndrome 03/28/2016  . Forearm mass, left 03/28/2016  . Morbid obesity ([place]) (BMI 35 plus 2 comorbidities) 03/06/2016  . Chronic diastolic CHF (congestive heart failure) ([place]) 07/20/2015  . PAF (paroxysmal atrial fibrillation) ([place]) 04/24/2015  . History of lumbar spinal fusion 09/27/2014  . Central centrifugal scarring alopecia  06/23/2013  . Left adrenal mass ([place]) 05/12/2013  . Back pain 08/03/2012  . Allergic rhinitis 04/03/2009  . Asthma, mild, intermittent 01/23/2009  . History of colonic polyps 04/21/2008  . Depression, recurrent ([place]) 03/03/2007  . Internal hemorrhoids 03/03/2007  . GERD 03/03/2007  . Diverticulosis, with history of diverticulitis of descending and sigmoid colon 03/03/2007  . Hepatic steatosis, found on CT 03/03/2007  . OSA (obstructive sleep apnea) 03/03/2007  . Essential hypertension 06/25/2006    Past Surgical History:  Procedure Laterality Date  .  ABDOMINAL HYSTERECTOMY    . APPENDECTOMY    . BREAST REDUCTION SURGERY    . BUNIONECTOMY     bilateral  . CERVICAL FUSION    . CHOLECYSTECTOMY N/A 10/01/2016   Procedure: LAPAROSCOPIC CHOLECYSTECTOMY;  Surgeon: Coralie Keens, MD;  Location: [facility];  Service: General;  Laterality: N/A;  . COLONOSCOPY WITH PROPOFOL N/A 05/12/2019   Procedure: COLONOSCOPY WITH PROPOFOL;  Surgeon: Juanita Craver, MD;  Location: [facility];  Service: Endoscopy;  Laterality: N/A;  . MAXIMUM ACCESS (MAS)POSTERIOR LUMBAR INTERBODY FUSION (PLIF) 2 LEVEL N/A 09/27/2014   Procedure: Maximum Access Surgery Posterior Lumbar Interbody Fusion Lumbar three-four, Lumbar four-five;  Surgeon: Eustace Moore, MD;  Location: [facility];  Service: Neurosurgery;  Laterality: N/A;  . POLYPECTOMY  05/12/2019   Procedure: POLYPECTOMY;  Surgeon: Juanita Craver, MD;  Location: [facility];  Service: Endoscopy;;  . SPINE SURGERY    . TOTAL ABDOMINAL HYSTERECTOMY W/ BILATERAL SALPINGOOPHORECTOMY      OB History   No obstetric history on file.      Home Medications    Prior to Admission medications   Medication Sig Start Date End Date Taking? Authorizing Provider  acetaminophen (TYLENOL) 500 MG tablet Take 500-1,000 mg by mouth every 6 (six) hours as needed for mild pain or moderate pain (arthritis).    [provider]  amLODipine (NORVASC) 5 MG tablet Take 1 tablet (5 mg total) by mouth daily. 10/19/19 10/13/20  Nahser, Wonda Cheng, MD  apixaban (ELIQUIS) 5 MG TABS tablet Take 1 tablet (5 mg total) by mouth 2 (two) times daily. 10/19/19   Nahser, Wonda Cheng, MD  benzonatate (TESSALON) 100 MG capsule Take 1-2 capsules (100-200 mg total) by mouth every 8 (eight) hours. 11/20/19   Noe Gens, PA-C  Calcium Carbonate-Vitamin D (CALCIUM 600+D) 600-400 MG-UNIT per tablet Take 1 tablet by mouth daily.     [provider]  Cholecalciferol (VITAMIN D3) 50 MCG (2000 UT) TABS Take 2,000 Units by mouth daily.    [provider]  ipratropium (ATROVENT) 0.06 % nasal spray Place 2 sprays into both nostrils 4 (four) times daily. 11/20/19   Noe Gens, PA-C  Magnesium 250 MG TABS Take 250 mg by mouth daily.    [provider]  metoprolol succinate (TOPROL-XL) 25 MG 24 hr tablet Take 1 tablet (25 mg total) by mouth daily. 10/19/19   Nahser, Wonda Cheng, MD  Multiple Vitamin (MULTIVITAMIN WITH MINERALS) TABS tablet Take 1 tablet by mouth daily. Alive    [provider]  Probiotic Product ([place] PO) Take 1 tablet by mouth daily.    [provider]  propranolol (INDERAL) 10 MG tablet Take 1 tablet (10 mg total) by mouth 4 (four) times daily as needed. 11/10/18   Nahser, Wonda Cheng, MD    Family History Family History  Problem Relation Age of Onset  . Diabetes Mother   . Hypertension Mother   . Coronary  artery disease Sister   . Hypertension Sister   . Colon cancer Neg Hx     Social History Social History   Tobacco Use  . Smoking status: Never Smoker  . Smokeless tobacco: Never Used  Vaping Use  . Vaping Use: Never used  Substance Use Topics  . Alcohol use: No  . Drug use: No     Allergies   Other, Losartan, and Tramadol   Review of Systems Review of Systems  Constitutional: Negative for chills and fever.  HENT: Positive for congestion, postnasal drip and sore throat. Negative for ear pain, trouble swallowing and voice change.   Respiratory: Positive for cough. Negative for shortness of breath.   Cardiovascular: Negative for chest pain and palpitations.  Gastrointestinal: Negative for abdominal pain, diarrhea, nausea and vomiting.  Musculoskeletal: Negative for arthralgias, back pain and myalgias.  Skin: Negative for rash.  All other systems reviewed and are negative.    Physical Exam Triage Vital Signs ED Triage Vitals  Enc Vitals Group     BP 11/20/19 1514 (!) 162/84     Pulse Rate 11/20/19 1514 72     Resp 11/20/19 1514 16     Temp  11/20/19 1514 98.6 F (37 C)     Temp Source 11/20/19 1514 Oral     SpO2 11/20/19 1514 97 %     Weight 11/20/19 1515 225 lb (102.1 kg)     Height 11/20/19 1515 _0  (1.651 m)     Head Circumference --      Peak Flow --      Pain Score 11/20/19 1515 6     Pain Loc --      Pain Edu? --      Excl. in [place]? --    No data found.  Updated Vital Signs BP (!) 162/84 (BP Location: Right Arm)   Pulse 72   Temp 98.6 F (37 C) (Oral)   Resp 16   Ht _1  (1.651 m)   Wt 225 lb (102.1 kg)   SpO2 97%   BMI 37.44 kg/m   Visual Acuity Right Eye Distance:   Left Eye Distance:   Bilateral Distance:    Right Eye Near:   Left Eye Near:    Bilateral Near:     Physical Exam Vitals and nursing note reviewed.  Constitutional:      General: She is not in acute distress.    Appearance: Normal appearance. She is well-developed. She is not ill-appearing, toxic-appearing or diaphoretic.  HENT:     Head: Normocephalic and atraumatic.     Right Ear: Tympanic membrane and ear canal normal.     Left Ear: Tympanic membrane and ear canal normal.     Nose: Nose normal.     Right Sinus: No maxillary sinus tenderness or frontal sinus tenderness.     Left Sinus: No maxillary sinus tenderness or frontal sinus tenderness.     Mouth/Throat:     Lips: Pink.     Mouth: Mucous membranes are moist.     Pharynx: Oropharynx is clear. Uvula midline. No pharyngeal swelling, oropharyngeal exudate, posterior oropharyngeal erythema or uvula swelling.  Cardiovascular:     Rate and Rhythm: Normal rate and regular rhythm.  Pulmonary:     Effort: Pulmonary effort is normal. No respiratory distress.     Breath sounds: Normal breath sounds. No stridor. No wheezing, rhonchi or rales.  Musculoskeletal:        General: Normal range of motion.  Cervical back: Normal range of motion and neck supple. No tenderness.  Lymphadenopathy:     Cervical: No cervical adenopathy.  Skin:    General: Skin is warm and dry.    Neurological:     Mental Status: She is alert and oriented to person, place, and time.  Psychiatric:        Behavior: Behavior normal.      UC Treatments / Results  Labs (all labs ordered are listed, but only abnormal results are displayed) Labs Reviewed  NOVEL CORONAVIRUS, NAA    EKG   Radiology No results found.  Procedures Procedures (including critical care time)  Medications Ordered in UC Medications - No data to display  Initial Impression / Assessment and Plan / UC Course  I have reviewed the triage vital signs and the nursing notes.  Pertinent labs & imaging results that were available during my care of the patient were reviewed by me and considered in my medical decision making (see chart for details).     COVID PCR test pending Encouraged symptomatic tx F/u with PCP as needed AVS given  Final Clinical Impressions(s) / UC Diagnoses   Final diagnoses:  Loss of taste  Loss of smell  Acute upper respiratory infection     Discharge Instructions      You may take 574m acetaminophen every 4-6 hours or in combination with ibuprofen 400-60103mevery 6-8 hours as needed for pain, inflammation, and fever.  Be sure to well hydrated with clear liquids and get at least 8 hours of sleep at night, preferably more while sick.   Please follow up with family medicine in 1 week if needed.     ED Prescriptions    Medication Sig Dispense Auth. Provider   ipratropium (ATROVENT) 0.06 % nasal spray Place 2 sprays into both nostrils 4 (four) times daily. 15 mL Dail Meece O, PA-C   benzonatate (TESSALON) 100 MG capsule Take 1-2 capsules (100-200 mg total) by mouth every 8 (eight) hours. 21 capsule [place]e [place]ens[place]   PDMP not reviewed this encounter.   PhNoe GensPA[place]1/14/21 1616

## 2019-11-20 NOTE — Discharge Instructions (Signed)
  You may take 500mg acetaminophen every 4-6 hours or in combination with ibuprofen 400-600mg every 6-8 hours as needed for pain, inflammation, and fever.  Be sure to well hydrated with clear liquids and get at least 8 hours of sleep at night, preferably more while sick.   Please follow up with family medicine in 1 week if needed.   

## 2019-11-20 NOTE — ED Triage Notes (Signed)
Patient here day 4 of congestion, cough and loss of taste; had been to the [place]. Has had covid vaccinations March/May and influenza vaccination.

## 2019-11-22 ENCOUNTER — Telehealth: Payer: Self-pay

## 2019-11-22 NOTE — Telephone Encounter (Signed)
Calling for results. Informed that results are still pending.

## 2019-11-24 ENCOUNTER — Telehealth: Payer: Self-pay

## 2019-11-24 LAB — NOVEL CORONAVIRUS, NAA: SARS-CoV-2, NAA: NOT DETECTED

## 2019-11-24 NOTE — Telephone Encounter (Signed)
Patient called for covid test results, results still pending. Patient and husband aware.

## 2020-01-25 ENCOUNTER — Ambulatory Visit (INDEPENDENT_AMBULATORY_CARE_PROVIDER_SITE_OTHER): Admitting: Bariatrics

## 2020-01-25 ENCOUNTER — Encounter (INDEPENDENT_AMBULATORY_CARE_PROVIDER_SITE_OTHER): Payer: Self-pay | Admitting: Bariatrics

## 2020-01-25 ENCOUNTER — Other Ambulatory Visit: Payer: Self-pay

## 2020-01-25 VITALS — BP 149/83 | HR 60 | Temp 98.0°F | Ht 65.0 in | Wt 231.0 lb

## 2020-01-25 DIAGNOSIS — Z1331 Encounter for screening for depression: Secondary | ICD-10-CM | POA: Diagnosis not present

## 2020-01-25 DIAGNOSIS — Z0289 Encounter for other administrative examinations: Secondary | ICD-10-CM

## 2020-01-25 DIAGNOSIS — I5032 Chronic diastolic (congestive) heart failure: Secondary | ICD-10-CM

## 2020-01-25 DIAGNOSIS — K219 Gastro-esophageal reflux disease without esophagitis: Secondary | ICD-10-CM

## 2020-01-25 DIAGNOSIS — R5383 Other fatigue: Secondary | ICD-10-CM | POA: Diagnosis not present

## 2020-01-25 DIAGNOSIS — G4733 Obstructive sleep apnea (adult) (pediatric): Secondary | ICD-10-CM

## 2020-01-25 DIAGNOSIS — R0602 Shortness of breath: Secondary | ICD-10-CM

## 2020-01-25 DIAGNOSIS — Z6838 Body mass index (BMI) 38.0-38.9, adult: Secondary | ICD-10-CM

## 2020-01-25 DIAGNOSIS — I10 Essential (primary) hypertension: Secondary | ICD-10-CM | POA: Diagnosis not present

## 2020-01-25 DIAGNOSIS — Z9189 Other specified personal risk factors, not elsewhere classified: Secondary | ICD-10-CM

## 2020-01-25 DIAGNOSIS — E559 Vitamin D deficiency, unspecified: Secondary | ICD-10-CM

## 2020-01-25 DIAGNOSIS — R7303 Prediabetes: Secondary | ICD-10-CM

## 2020-01-25 DIAGNOSIS — F5089 Other specified eating disorder: Secondary | ICD-10-CM

## 2020-01-25 NOTE — Progress Notes (Signed)
Dear Dr. Orland Mustard,   Thank you for referring LEAYA MCCORKLE to our clinic. The following note includes my evaluation and treatment recommendations.  Chief Complaint:   OBESITY [name] (MR# [id number]) is a 65 y.o. female who presents for evaluation and treatment of obesity and related comorbidities. Current BMI is Body mass index is 38.44 kg/m. Chrysa has been struggling with her weight for many years and has been unsuccessful in either losing weight, maintaining weight loss, or reaching her healthy weight goal.  Helana is currently in the action stage of change and ready to dedicate time achieving and maintaining a healthier weight. Annete is interested in becoming our patient and working on intensive lifestyle modifications including (but not limited to) diet and exercise for weight loss.  Aliveah states that she is lactose intolerant.  She does like to cook.  She sometimes skips breakfast and lunch.  Her left hip needs surgery.  Lokelani's habits were reviewed today and are as follows: Her family eats meals together, she thinks her family will eat healthier with her, her desired weight loss is 58 pounds, she started gaining weight during menopause, her heaviest weight ever was her current weight, she is a picky eater and doesn't like to eat healthier foods, she craves salty foods, she snacks frequently in the evenings, she skips breakfast or lunch frequently and she struggles with emotional eating.  Depression Screen Kandy's Food and Mood (modified PHQ-9) score was 15.  Depression screen PHQ 2/9 01/25/2020  Decreased Interest 3  Down, Depressed, Hopeless 3  PHQ - 2 Score 6  Altered sleeping 1  Tired, decreased energy 3  Change in appetite 2  Feeling bad or failure about yourself  1  Trouble concentrating 2  Moving slowly or fidgety/restless 0  Suicidal thoughts 0  PHQ-9 Score 15  Difficult doing work/chores Somewhat difficult  Some recent data might be hidden   Subjective:    1. Other fatigue Taleasha denies daytime somnolence and reports waking up still tired. Patent has a history of symptoms of morning fatigue, morning headache and snoring. Kristan generally gets 4 or 5 hours of sleep per night, and states that she has poor quality sleep. Snoring is present. Apneic episodes are not present. Epworth Sleepiness Score is 2.  2. SOB (shortness of breath) on exertion Vaughan Basta notes increasing shortness of breath with exercising and seems to be worsening over time with weight gain. She notes getting out of breath sooner with activity than she used to. This has gotten worse recently. Deetya denies shortness of breath at rest or orthopnea.  3. Essential hypertension Review: taking medications as instructed, no medication side effects noted, no chest pain on exertion, no dyspnea on exertion, no swelling of ankles.  Blood pressure is slightly elevated today.  She has taken her medication.  She takes Norvasc, Toprol-XL, and Inderal.  BP Readings from Last 3 Encounters:  01/25/20 (!) 149/83  11/20/19 (!) 162/84  09/19/19 128/76   4. Chronic diastolic CHF (congestive heart failure) (HCC) Stable.  5. OSA (obstructive sleep apnea) Janny has a diagnosis of sleep apnea. She reports that she is using a CPAP regularly.  She developed poor sleep with menopausal signs and symptoms.  6. Gastroesophageal reflux disease, unspecified whether esophagitis present She is not on any medications.  Denies any problems.  7. Vitamin D deficiency She is currently taking OTC vitamin D 2,000 IU each day. She denies nausea, vomiting or muscle weakness.  8. Prediabetes Schuylar has  a diagnosis of prediabetes based on her elevated HgA1c and was informed this puts her at greater risk of developing diabetes. She continues to work on diet and exercise to decrease her risk of diabetes. She denies nausea or hypoglycemia.  She was recently diagnosed with this about 1 month ago.  Lab Results  Component Value  Date   HGBA1C 5.6 07/29/2016   9. Other disorder of eating She endorses stress eating.  10. Depression screening Pranika was screened for depression as part of her new patient workup today.  PHQ-9 is 15.  11. At risk for activity intolerance Rily is at risk for activity intolerance due to fatigue and shortness of breath.  Assessment/Plan:   1. Other fatigue Odessey does feel that her weight is causing her energy to be lower than it should be. Fatigue may be related to obesity, depression or many other causes. Labs will be ordered, and in the meanwhile, Elwood will focus on self care including making healthy food choices, increasing physical activity and focusing on stress reduction.  She will gradually increase activities.  Will check thyroid panel today.  - EKG 12-Lead - Insulin, random - T3 - T4, free - TSH - VITAMIN D 25 Hydroxy (Vit-D Deficiency, Fractures)  2. SOB (shortness of breath) on exertion Raymie does feel that she gets out of breath more easily that she used to when she exercises. Emili's shortness of breath appears to be obesity related and exercise induced. She has agreed to work on weight loss and gradually increase exercise to treat her exercise induced shortness of breath. Will continue to monitor closely.  3. Essential hypertension Tenaya is working on healthy weight loss and exercise to improve blood pressure control. We will watch for signs of hypotension as she continues her lifestyle modifications.  Continue medications.  4. Chronic diastolic CHF (congestive heart failure) ([place]) Follow-up with cardiologist every 6 months.  5. OSA (obstructive sleep apnea) Intensive lifestyle modifications are the first line treatment for this issue. We discussed several lifestyle modifications today and she will continue to work on diet, exercise and weight loss efforts. We will continue to monitor. Orders and follow up as documented in patient record.  Continue to use CPAP.   6.  Gastroesophageal reflux disease, unspecified whether esophagitis present Intensive lifestyle modifications are the first line treatment for this issue. We discussed several lifestyle modifications today and she will continue to work on diet, exercise and weight loss efforts. Orders and follow up as documented in patient record.  Rosebella will avoid GERD triggers.  Counseling . If a person has gastroesophageal reflux disease (GERD), food and stomach acid move back up into the esophagus and cause symptoms or problems such as damage to the esophagus. . Anti-reflux measures include: raising the head of the bed, avoiding tight clothing or belts, avoiding eating late at night, not lying down shortly after mealtime, and achieving weight loss. . Avoid ASA, NSAID's, caffeine, alcohol, and tobacco.  . OTC Pepcid and/or Tums are often very helpful for as needed use.  [name] However, for persisting chronic or daily symptoms, stronger medications like Omeprazole may be needed. . You may need to avoid foods and drinks such as: ? Coffee and tea (with or without caffeine). ? Drinks that contain alcohol. ? Energy drinks and sports drinks. ? Bubbly (carbonated) drinks or sodas. ? Chocolate and cocoa. ? Peppermint and mint flavorings. ? Garlic and onions. ? Horseradish. ? Spicy and acidic foods. These include peppers, chili powder, curry powder, vinegar,  hot sauces, and BBQ sauce. ? Citrus fruit juices and citrus fruits, such as oranges, lemons, and limes. ? Tomato-based foods. These include red sauce, chili, salsa, and pizza with red sauce. ? Fried and fatty foods. These include donuts, french fries, potato chips, and high-fat dressings. ? High-fat meats. These include hot dogs, rib eye steak, sausage, ham, and bacon.  7. Vitamin D deficiency Low Vitamin D level contributes to fatigue and are associated with obesity, breast, and colon cancer.  She will continue her OTC vitamin D and we will check her vitamin D level  today.  - T3 - T4, free - TSH - VITAMIN D 25 Hydroxy (Vit-D Deficiency, Fractures)  8. Prediabetes Miski will continue to work on weight loss, exercise, and decreasing simple carbohydrates to help decrease the risk of diabetes.  Follow-up with PCP.  She will work on diet and exercise and we will check her insulin level today.  - Insulin, random  9. Other disorder of eating We discussed CBT techniques.  10. Depression screening Riva had a positive depression screening. Depression is commonly associated with obesity and often results in emotional eating behaviors. We will monitor this closely and work on CBT to help improve the non-hunger eating patterns. Referral to Psychology may be required if no improvement is seen as she continues in our clinic.  11. At risk for activity intolerance Britteney was given approximately 15 minutes of exercise intolerance counseling today. She is 65 y.o. female and has risk factors exercise intolerance including obesity. We discussed intensive lifestyle modifications today with an emphasis on specific weight loss instructions and strategies. Lorrena will slowly increase activity as tolerated.  Repetitive spaced learning was employed today to elicit superior memory formation and behavioral change.  12. Class 2 severe obesity with serious comorbidity and body mass index (BMI) of 38.0 to 38.9 in adult, unspecified obesity type [facility])  Saida is currently in the action stage of change and her goal is to continue with weight loss efforts. I recommend Delmar begin the structured treatment plan as follows:  She has agreed to the Category 2 Plan.  She will work on meal planning, mindful eating, and will stop all sugary drinks.  Exercise goals: No exercise has been prescribed at this time.   Behavioral modification strategies: increasing lean protein intake, decreasing simple carbohydrates, increasing vegetables, increasing water intake, decreasing eating out, no  skipping meals, meal planning and cooking strategies, keeping healthy foods in the home and planning for success.  She was informed of the importance of frequent follow-up visits to maximize her success with intensive lifestyle modifications for her multiple health conditions. She was informed we would discuss her lab results at her next visit unless there is a critical issue that needs to be addressed sooner. Darnella agreed to keep her next visit at the agreed upon time to discuss these results.  Objective:   Blood pressure (!) 149/83, pulse 60, temperature 98 F (36.7 C), temperature source Oral, height 5\' 5"  (1.651 m), weight 231 lb (104.8 kg), SpO2 96 %. Body mass index is 38.44 kg/m.  EKG: Normal sinus rhythm, rate 57 bpm.  Indirect Calorimeter completed today shows a VO2 of 263 and a REE of 1831.  Her calculated basal metabolic rate is 7425 thus her basal metabolic rate is better than expected.  General: Cooperative, alert, well developed, in no acute distress. HEENT: Conjunctivae and lids unremarkable. Cardiovascular: Regular rhythm.  Lungs: Normal work of breathing. Neurologic: No focal deficits.   Lab Results  Component Value Date   CREATININE 1.01 (H) 10/01/2018   BUN 8 10/01/2018   NA 141 10/01/2018   K 4.8 10/01/2018   CL 105 10/01/2018   CO2 23 10/01/2018   Lab Results  Component Value Date   ALT 24 10/01/2018   AST 25 10/01/2018   ALKPHOS 70 10/01/2018   BILITOT 0.5 10/01/2018   Lab Results  Component Value Date   HGBA1C 5.6 07/29/2016   HGBA1C 5.8 04/22/2016   HGBA1C 5.8 (H) 01/28/2012   HGBA1C 5.8 11/14/2009   HGBA1C 5.6 08/21/2009   Lab Results  Component Value Date   TSH 1.20 04/22/2016   Lab Results  Component Value Date   CHOL 139 10/01/2018   HDL 42 10/01/2018   LDLCALC 75 10/01/2018   TRIG 124 10/01/2018   CHOLHDL 3.3 10/01/2018   Lab Results  Component Value Date   WBC 5.5 09/04/2017   HGB 16.4 (H) 09/04/2017   HCT 49.5 (H) 09/04/2017    MCV 100.0 09/04/2017   PLT 421 (H) 09/04/2017   Lab Results  Component Value Date   IRON 76 12/13/2007   Attestation Statements:   Reviewed by clinician on day of visit: allergies, medications, problem list, medical history, surgical history, family history, social history, and previous encounter notes.  I, Water quality scientist, CMA, am acting as Location manager for [employer], DO  I have reviewed the above documentation for accuracy and completeness, and I agree with the above. Jearld Lesch, DO

## 2020-01-26 ENCOUNTER — Telehealth: Payer: Self-pay | Admitting: *Deleted

## 2020-01-26 ENCOUNTER — Encounter (INDEPENDENT_AMBULATORY_CARE_PROVIDER_SITE_OTHER): Payer: Self-pay | Admitting: Bariatrics

## 2020-01-26 LAB — TSH: TSH: 1.67 u[IU]/mL (ref 0.450–4.500)

## 2020-01-26 LAB — INSULIN, RANDOM: INSULIN: 9.2 u[IU]/mL (ref 2.6–24.9)

## 2020-01-26 LAB — T4, FREE: Free T4: 1.42 ng/dL (ref 0.82–1.77)

## 2020-01-26 LAB — VITAMIN D 25 HYDROXY (VIT D DEFICIENCY, FRACTURES): Vit D, 25-Hydroxy: 70.2 ng/mL (ref 30.0–100.0)

## 2020-01-26 LAB — T3: T3, Total: 110 ng/dL (ref 71–180)

## 2020-01-26 NOTE — Telephone Encounter (Signed)
   Primary Cardiologist: Mertie Moores, MD  Chart reviewed as part of pre-operative protocol coverage. Patient was contacted 01/26/2020 in reference to pre-operative risk assessment for pending surgery as outlined below.  [name] was last seen on 09/19/19 by Dr. Acie Fredrickson.  Since that day, [name] reports increased palpitations requiring her PRN Atenolol and some intermittent chest tightness.  As such, scheduled for office visit 01/31/20 with Dr. Acie Fredrickson for preoperative clearance.   I did review that she may hold Eliquis 3 days prior to the planned procedure per pharmacy review.  Will route to requesting provider to make them aware clearance will be addressed at office visit 01/31/20.   Will remove from preop pool.    Loel Dubonnet, NP 01/26/2020, 4:30 PM

## 2020-01-26 NOTE — Telephone Encounter (Signed)
Patient with diagnosis of afib on Eliquis for anticoagulation.    Procedure: left THA Date of procedure: 02/21/20   CHA2DS2-VASc Score = 3  This indicates a 3.2% annual risk of stroke. The patient's score is based upon: CHF History: Yes HTN History: Yes Diabetes History: No Stroke History: No Vascular Disease History: No Age Score: 0 Gender Score: 1   CrCl 22mL/min Platelet count 421K (2019)  Per office protocol, patient can hold Eliquis for 3 days prior to procedure.

## 2020-01-26 NOTE — Telephone Encounter (Signed)
   [facility] Pre-operative Risk Assessment    HEARTCARE STAFF: - Please ensure there is not already an duplicate clearance open for this procedure. - Under Visit Info/Reason for Call, type in Other and utilize the format Clearance MM/DD/YY or Clearance TBD. Do not use dashes or single digits. - If request is for dental extraction, please clarify the # of teeth to be extracted.  Request for surgical clearance:  1. What type of surgery is being performed? LEFT TOTAL HIP ARTHROPLASTY    2. When is this surgery scheduled? 02/21/20   3. What type of clearance is required (medical clearance vs. Pharmacy clearance to hold med vs. Both)? BOTH  4. Are there any medications that need to be held prior to surgery and how long? ELIQUIS   5. Practice name and name of physician performing surgery? [facility]; DR. Duson   6. What is the office phone number? [phone]   7.   What is the office fax number? [place]  8.   Anesthesia type (None, local, MAC, general) ? SPINAL   Julaine Hua 01/26/2020, 12:34 PM  _________________________________________________________________   (provider comments below)

## 2020-01-31 ENCOUNTER — Other Ambulatory Visit: Payer: Self-pay

## 2020-01-31 ENCOUNTER — Ambulatory Visit (INDEPENDENT_AMBULATORY_CARE_PROVIDER_SITE_OTHER): Admitting: Cardiovascular Disease

## 2020-01-31 ENCOUNTER — Encounter: Payer: Self-pay | Admitting: Cardiovascular Disease

## 2020-01-31 VITALS — BP 144/78 | HR 76 | Ht 65.0 in | Wt 231.8 lb

## 2020-01-31 DIAGNOSIS — I480 Paroxysmal atrial fibrillation: Secondary | ICD-10-CM | POA: Diagnosis not present

## 2020-01-31 DIAGNOSIS — I10 Essential (primary) hypertension: Secondary | ICD-10-CM | POA: Diagnosis not present

## 2020-01-31 DIAGNOSIS — I5032 Chronic diastolic (congestive) heart failure: Secondary | ICD-10-CM | POA: Diagnosis not present

## 2020-01-31 NOTE — Patient Instructions (Signed)
Medication Instructions:  Your provider recommends that you continue on your current medications as directed. Please refer to the Current Medication list given to you today.   *If you need a refill on your cardiac medications before your next appointment, please call your pharmacy*  Follow-Up: At [facility], you and your health needs are our priority.  As part of our continuing mission to provide you with exceptional heart care, we have created designated Provider Care Teams.  These Care Teams include your primary Cardiologist (physician) and Advanced Practice Providers (APPs -  Physician Assistants and Nurse Practitioners) who all work together to provide you with the care you need, when you need it. Your next appointment:   6 month(s) The format for your next appointment:   In Person Provider:   You will see one of the following Advanced Practice Providers on your designated Care Team:    Richardson Dopp, PA-C  [place], [place]   SURGERY CLEARANCE: You are cleared for surgery from a cardiac perspective. It is OK for you to hold Eliquis for 2-3 days prior to your hip surgery (instruction per Dr. Alvan Dame).

## 2020-01-31 NOTE — Telephone Encounter (Signed)
   Primary Cardiologist: Mertie Moores, MD  Chart reviewed as part of pre-operative protocol coverage. Given past medical history and time since last visit, based on ACC/AHA guidelines, [name] would be at acceptable risk for the planned procedure without further cardiovascular testing.   Patient with diagnosis of afib on Eliquis for anticoagulation.    Procedure: left THA Date of procedure: 02/21/20   CHA2DS2-VASc Score = 3  This indicates a 3.2% annual risk of stroke. The patient's score is based upon: CHF History: Yes HTN History: Yes Diabetes History: No Stroke History: No Vascular Disease History: No Age Score: 0 Gender Score: 1   CrCl 84mL/min Platelet count 421K (2019)  Per office protocol, patient can hold Eliquis for 3 days prior to procedure.  I will route this recommendation to the requesting party via Epic fax function and remove from pre-op pool.  Please call with questions.  Jossie Ng. Mattox Schorr NP-C    01/31/2020, 4:13 PM [facility] [address] Office [phone] Fax [phone]

## 2020-01-31 NOTE — Telephone Encounter (Signed)
[name] is at low risk for her hip surgery .  She may hold her Eliquis for 2-3 days prior to hip surgery     Mertie Moores, MD  01/31/2020 4:07 PM    [name],  [name], [name] Phone: [phone]; Fax: [phone]

## 2020-01-31 NOTE — Progress Notes (Signed)
Cardiology Office Note   Date:  01/31/2020   ID:  [name], [name] [birth year], MRN [id number]  PCP:  London Pepper, MD  Cardiologist:   Mertie Moores, MD   No chief complaint on file.  Problem List 1. Atrial fib - This patients CHA2DS2-VASc Score and unadjusted Ischemic Stroke Rate (% per year) is equal to 2.2 % stroke rate/year from a score of 2  Above score calculated as 1 point each if present [CHF, HTN, DM, Vascular=MI/PAD/Aortic Plaque, Age if 65-74, or Female] Above score calculated as 2 points each if present [Age > 75, or Stroke/TIA/TE]  2. Essential HTN 3. Grade 2 diastolic dysfunction  - normal LV function 4. Obstructive sleep apnea -   [name] is a 65 y.o. female who presents for atrial fib, She was seen in the ER with PAF Has had a dull head ache.   Echo shows normal LV systolic function Myoview shows no ischemia   July 20, 2015:  [name] is seen today for follow up visit for her paroxysmal atrial fib.  Has had 1 episode of palpitations - likely was PAF - related to stress.  Is watching the salt in her diet .  The Xarelto seems to be upsetting her stomach  Held it for several days and her stomach started bothering her again  March 06, 2016:  Doing well  Having some back pain  Had an episode of atrial fib 3 nights ago.   Work her up . Has OAS - uses CPAP but she had pulled it off. She put the CPAP mask back on   09/15/2016: [name] is seen back for follow-up evaluation. Wt is 228 ( down 9 lbs from last visit )  Has been having more palpitations.  Took some extra metoprolol which seems    Has not been using her CPAP because her mask has lots of condensation and drains back into there mask .  Has been tried on Lisinopril ( caused CP and cough) and was on Valsartan ( was recalled )  Doing well on the [facility]   March 18, 2017:   [name] is seen back today for follow-up of her chronic diastolic congestive heart failure, paroxysmal atrial  fibrillation Has had difficult losing weight Has night sweats - possbily due to menapause  BP is a little elevated.   Under lots of stress  - sister is in the hospital ( heavy smoker)  No dyspnea. Still has occasional palpitations - took an extra propranolol which helps   June 17, 2017:  [name] is seen back for an early work in visit Was seen by Dr. Martinique a month ago.  She was having episodes of chest pain and an elevated blood pressure. She had been seen in the emergency room complaining of chest pain and left arm pain.  Troponin levels were normal. Dr. Doug Sou opinion was that the chest pain was related to her elevated blood pressure.  She has had a normal Myoview study 2 years ago.  The plan is to repeat the Myoview study if she has recurrent symptoms.  BP is still  Moderately elevated  She has cut back on her salt.    Has seen a nutritionist.      August 03, 2017: [name] is seen today for follow-up of her hypertension and chronic diastolic congestive heart failure. BP readings have been elevated.  Has been to the [facility]   she had some Hibiscus Tea yesterday  -  BP  is normal today   Sept. 5, 2019: [name] is seen today for follow-up of her hypertension and chronic diastolic congestive heart failure.  She was recently seen in the emergency room for paroxysmal atrial fibrillation.  She has a history of obstructive sleep apnea but has not been wearing her CPAP mask.  Episode of A flutter  - woke her up at 5 am.   Sent to the ER  Aflutter resolved with IV cardiazem   February 01, 2018: [name] was seen in the emergency room in December, 2019.   Thought to have bronchitisWas admitted to [facility] over the [facility] holidays  . Pressure is been well controlled. Received lots of meds , steroids. Is just now getting better .  Is wearing her CPAP  BP is well controlled.  Has gained some weight Wt today is 231 lbs    ( weight in Sept. 2019  was 225)   [facility] a [facility] .   Sept.  28, 2020   [name] is seen today for follow up of her PAF and HTN  Wt.239 lbs ( up 8 lbs since last year )  Was going to a trainer  But has gained wt with covid .  No dyspnea.  No cp,     Sept. 13, 2021 [name] is seen for follow up of her PAF, HTN Wt = 220 ( down 19 lbs)    - was 239 last year Has developed an injury of her left hip   Has had her covid vaccines ( 2)   Her left hip pain is causing her to not sleep well at night . Has had more episodes of Atib.  Uses her CPAP    Jan. 25, 2022 [name] is seen for pre op visit prior to hip replacement  Wt is 231 ( up 11 lbs )   Has occasional palpitations   Past Medical History:  Diagnosis Date  . Allergic rhinitis 04/03/2009  . Anemia    hx  . Arthritis   . Asthma, mild, intermittent 01/23/2009   Qualifier: Diagnosis of  By: Jerold Coombe - denies  . Back pain 08/03/2012  . 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 Chronic diastolic CHF (congestive heart failure) ([place]) 07/20/2015  . Depression, recurrent ([place]) 03/03/2007   Qualifier: Diagnosis of  By: Nils Pyle CMA (AAMA), Mearl Latin  -denies  . Difficult intubation    04/09/04 with difficult intubation. unabale to pass ETT with Sabra Heck 2; unable to visualize cord with MAC 3; intubating LMA used; uneventful intubation using Glidescope 09/27/14  . Diverticulosis, with history of diverticulitis of descending and sigmoid colon 03/03/2007  . Dysrhythmia    atrial fib  . Edema of both lower legs   . Essential hypertension 06/25/2006   Qualifier: Diagnosis of  By: Tiney Rouge CMA, Ellison Hughs    . Fatty liver   . Gastritis   . GERD 03/03/2007   Qualifier: Diagnosis of  By: Nils Pyle CMA (AAMA), Mearl Latin   Qualifier: Diagnosis of  By: Shane Crutch, [name]   . Hepatic steatosis, found on CT 03/03/2007      . Hiatal hernia, small 03/28/2016   EGD by [facility] 12/09/12.   . Hip pain   . History of colonic polyps  04/21/2008   Last Colonoscopy was 12/09/12, Dr. Fuller Plan. No polyp at that time. Repeat in 5 years.   . Internal hemorrhoids 03/03/2007   Last Colonoscopy was 12/09/12, Dr.  Fuller Plan. Repeat in 5 years.   . Joint pain   . Lactose intolerance   . Left adrenal mass ([place]) 05/12/2013  . Menopausal vasomotor syndrome 03/28/2016  . Morbid obesity ([place]) (BMI 35 plus 2 comorbidities) 03/06/2016   Comorbid conditions include HTN, HLD, hepatic steatosis, OA, GERD, OSA.  . OSA (obstructive sleep apnea) 03/03/2007   [place].   [name] PAF (paroxysmal atrial fibrillation) ([place]) 04/24/2015  . Palpitations   . PONV (postoperative nausea and vomiting)    area on left forearm DO NOT PLACE IV OR STICK ? vascular"will bleed"  . Umbilical hernia without obstruction or gangrene, fat containing 03/28/2016   Found on CT, 2017. No issues.    Past Surgical History:  Procedure Laterality Date  . ABDOMINAL HYSTERECTOMY    . APPENDECTOMY    . BREAST REDUCTION SURGERY    . BUNIONECTOMY     bilateral  . CERVICAL FUSION    . CHOLECYSTECTOMY N/A 10/01/2016   Procedure: LAPAROSCOPIC CHOLECYSTECTOMY;  Surgeon: Coralie Keens, MD;  Location: [facility];  Service: General;  Laterality: N/A;  . COLONOSCOPY WITH PROPOFOL N/A 05/12/2019   Procedure: COLONOSCOPY WITH PROPOFOL;  Surgeon: Juanita Craver, MD;  Location: [facility];  Service: Endoscopy;  Laterality: N/A;  . MAXIMUM ACCESS (MAS)POSTERIOR LUMBAR INTERBODY FUSION (PLIF) 2 LEVEL N/A 09/27/2014   Procedure: Maximum Access Surgery Posterior Lumbar Interbody Fusion Lumbar three-four, Lumbar four-five;  Surgeon: Eustace Moore, MD;  Location: [facility];  Service: Neurosurgery;  Laterality: N/A;  . POLYPECTOMY  05/12/2019   Procedure: POLYPECTOMY;  Surgeon: Juanita Craver, MD;  Location: [facility];  Service: Endoscopy;;  . SPINE SURGERY    . TOTAL ABDOMINAL HYSTERECTOMY W/ BILATERAL SALPINGOOPHORECTOMY       Current Outpatient Medications  Medication Sig Dispense Refill  .  acetaminophen (TYLENOL) 500 MG tablet Take 500-1,000 mg by mouth every 6 (six) hours as needed for mild pain or moderate pain (arthritis).    [name] amLODipine (NORVASC) 5 MG tablet Take 1 tablet (5 mg total) by mouth daily. 90 tablet 3  . apixaban (ELIQUIS) 5 MG TABS tablet Take 1 tablet (5 mg total) by mouth 2 (two) times daily. 180 tablet 1  . Calcium Carbonate-Vitamin D 600-400 MG-UNIT tablet Take 1 tablet by mouth daily.    . Cholecalciferol (VITAMIN D3) 50 MCG (2000 UT) TABS Take 2,000 Units by mouth daily.    [name] guaiFENesin (MUCINEX) 600 MG 12 hr tablet Take 1 tablet by mouth as needed.    . Magnesium 250 MG TABS Take 250 mg by mouth daily.    . metoprolol succinate (TOPROL-XL) 25 MG 24 hr tablet Take 1 tablet (25 mg total) by mouth daily. 90 tablet 3  . Multiple Vitamin (MULTIVITAMIN WITH MINERALS) TABS tablet Take 1 tablet by mouth daily. Alive    . Probiotic Product (ULTRAFLORA IMMUNE HEALTH PO) Take 1 tablet by mouth daily.    . propranolol (INDERAL) 10 MG tablet Take 1 tablet (10 mg total) by mouth 4 (four) times daily as needed. 360 tablet 3   No current facility-administered medications for this visit.    Allergies:   Other, Losartan, and Tramadol    Social History:  The patient  reports that she has never smoked. She has never used smokeless tobacco. She reports that she does not drink alcohol and does not use drugs.   Family History:  The patient's family history includes Coronary artery disease in her sister; Diabetes in her mother; Heart disease in her  mother; Hypertension in her mother and sister; Sleep apnea in her mother.    ROS:  Please see the history of present illness.    Physical Exam: Blood pressure (!) 144/78, pulse 76, height _0  (1.651 m), weight 231 lb 12.8 oz (105.1 kg), SpO2 97 %.  GEN:  Well nourished, well developed in no acute distress HEENT: Normal NECK: No JVD; No carotid bruits LYMPHATICS: No lymphadenopathy CARDIAC: RRR , no murmurs, rubs,  gallops RESPIRATORY:  Clear to auscultation without rales, wheezing or rhonchi  ABDOMEN: Soft, non-tender, non-distended MUSCULOSKELETAL:  No edema; No deformity  SKIN: Warm and dry NEUROLOGIC:  Alert and oriented x 3    EKG: 01/25/2020: Sinus bradycardia at 57.  No ST or T wave changes.   Recent Labs: 01/25/2020: TSH 1.670    Lipid Panel    Component Value Date/Time   CHOL 139 10/01/2018 0900   TRIG 124 10/01/2018 0900   HDL 42 10/01/2018 0900   CHOLHDL 3.3 10/01/2018 0900   CHOLHDL 3 04/22/2016 1234   VLDL 20.6 04/22/2016 1234   LDLCALC 75 10/01/2018 0900      Wt Readings from Last 3 Encounters:  01/31/20 231 lb 12.8 oz (105.1 kg)  01/25/20 231 lb (104.8 kg)  11/20/19 225 lb (102.1 kg)      Other studies Reviewed: Additional studies/ records that were reviewed today include: . Review of the above records demonstrates:    ASSESSMENT AND PLAN:  1.  Paroxysmal atrial fibrillation:     [name] is doing well.  She is maintaining sinus rhythm.  She is at low risk for her upcoming hip replacement.  It would be okay for her to hold the Eliquis for 2 to 3 days prior to her hip surgery.  2.   Essential hypertension:   BP is overall wel controlled.     3.    Chronic diastolic congestive heart failure:     4.  Chest pain:    5.  Obesity:       Current medicines are reviewed at length with the patient today.  The patient does not have concerns regarding medicines.  Labs/ tests ordered today include:   No orders of the defined types were placed in this encounter.   Disposition:   FU with me in  1 year      Mertie Moores, MD  01/31/2020 5:06 PM    [facility] [address], [place], [place]  [address] Phone: [phone]; Fax: [phone]

## 2020-02-08 ENCOUNTER — Ambulatory Visit (INDEPENDENT_AMBULATORY_CARE_PROVIDER_SITE_OTHER): Admitting: Bariatrics

## 2020-02-08 ENCOUNTER — Encounter (INDEPENDENT_AMBULATORY_CARE_PROVIDER_SITE_OTHER): Payer: Self-pay | Admitting: Bariatrics

## 2020-02-08 ENCOUNTER — Other Ambulatory Visit: Payer: Self-pay

## 2020-02-08 VITALS — BP 146/81 | HR 63 | Temp 97.9°F | Ht 65.0 in | Wt 225.0 lb

## 2020-02-08 DIAGNOSIS — R7303 Prediabetes: Secondary | ICD-10-CM | POA: Diagnosis not present

## 2020-02-08 DIAGNOSIS — I10 Essential (primary) hypertension: Secondary | ICD-10-CM | POA: Diagnosis not present

## 2020-02-08 DIAGNOSIS — Z6837 Body mass index (BMI) 37.0-37.9, adult: Secondary | ICD-10-CM | POA: Diagnosis not present

## 2020-02-13 ENCOUNTER — Encounter (INDEPENDENT_AMBULATORY_CARE_PROVIDER_SITE_OTHER): Payer: Self-pay | Admitting: Bariatrics

## 2020-02-13 NOTE — Progress Notes (Signed)
Chief Complaint:   OBESITY [name] is here to discuss her progress with her obesity treatment plan along with follow-up of her obesity related diagnoses. [name] is on the Category 2 Plan and states she is following her eating plan approximately 90% of the time. [name] states she is doing 0 minutes 0 times per week.  Today's visit was #: 2 Starting weight: 231 lbs Starting date: 01/25/2020 Today's weight: 225 lbs Today's date: 02/08/2020 Total lbs lost to date: 6 lbs Total lbs lost since last in-office visit: 6 lbs  Interim History: [name] is down 6 lbs and doing well. She is having "hip surgery" on 02/21/2020 and will then return. She did not struggle at this time.   Subjective:   1. Pre-diabetes [name] has PCOS. Her A1c is 5.6 and insulin level 9.2.   Lab Results  Component Value Date   HGBA1C 5.6 07/29/2016   Lab Results  Component Value Date   INSULIN 9.2 01/25/2020    2. Essential hypertension [name] is taking Norvasc, Inderal, and Toprol XL. Her BP is recently well controlled.   BP Readings from Last 3 Encounters:  02/08/20 (!) 146/81  01/31/20 (!) 144/78  01/25/20 (!) 149/83    Assessment/Plan:   1. Pre-diabetes [name] will continue to work on weight loss, exercise, and decreasing simple carbohydrates to help decrease the risk of diabetes. [name] will focus on decreasing carbohydrates and increasing healthy fats and proteins. Increase exercise.   2. Essential hypertension [name] is working on healthy weight loss and exercise to improve blood pressure control. We will watch for signs of hypotension as she continues her lifestyle modifications. Continue current treatment plan.  3. Class 2 severe obesity with serious comorbidity and body mass index (BMI) of 37.0 to 37.9 in adult, unspecified obesity type [name]) [name] is currently in the action stage of change. As such, her goal is to continue with weight loss efforts. She has agreed to the Category 2 Plan.   [name] will focus on  meal planning and intentional eating. Reviewed labs from 01/25/2020: Vit D, insulin, and thyroid panel. Recipes II.  Exercise goals: As is  Behavioral modification strategies: increasing lean protein intake, decreasing simple carbohydrates, increasing vegetables, increasing water intake, decreasing eating out, no skipping meals, meal planning and cooking strategies, keeping healthy foods in the home and planning for success.  [name] has agreed to follow-up with our clinic in ([name] will call for a f/u appt 1 month after 02/21/2020) weeks. She was informed of the importance of frequent follow-up visits to maximize her success with intensive lifestyle modifications for her multiple health conditions.   Objective:   Blood pressure (!) 146/81, pulse 63, temperature 97.9 F (36.6 C), height 5\' 5"  (1.651 m), weight 225 lb (102.1 kg), SpO2 99 %. Body mass index is 37.44 kg/m.  General: Cooperative, alert, well developed, in no acute distress. HEENT: Conjunctivae and lids unremarkable. Cardiovascular: Regular rhythm.  Lungs: Normal work of breathing. Neurologic: No focal deficits.   Lab Results  Component Value Date   CREATININE 1.01 (H) 10/01/2018   BUN 8 10/01/2018   NA 141 10/01/2018   K 4.8 10/01/2018   CL 105 10/01/2018   CO2 23 10/01/2018   Lab Results  Component Value Date   ALT 24 10/01/2018   AST 25 10/01/2018   ALKPHOS 70 10/01/2018   BILITOT 0.5 10/01/2018   Lab Results  Component Value Date   HGBA1C 5.6 07/29/2016   HGBA1C 5.8 04/22/2016   HGBA1C  5.8 (H) 01/28/2012   HGBA1C 5.8 11/14/2009   HGBA1C 5.6 08/21/2009   Lab Results  Component Value Date   INSULIN 9.2 01/25/2020   Lab Results  Component Value Date   TSH 1.670 01/25/2020   Lab Results  Component Value Date   CHOL 139 10/01/2018   HDL 42 10/01/2018   LDLCALC 75 10/01/2018   TRIG 124 10/01/2018   CHOLHDL 3.3 10/01/2018   Lab Results  Component Value Date   WBC 5.5 09/04/2017   HGB 16.4 (H)  09/04/2017   HCT 49.5 (H) 09/04/2017   MCV 100.0 09/04/2017   PLT 421 (H) 09/04/2017   Lab Results  Component Value Date   IRON 76 12/13/2007    Attestation Statements:   Reviewed by clinician on day of visit: allergies, medications, problem list, medical history, surgical history, family history, social history, and previous encounter notes.  Coral Ceo, am acting as Location manager for [employer], DO.  I have reviewed the above documentation for accuracy and completeness, and I agree with the above. Jearld Lesch, DO

## 2020-02-14 NOTE — Progress Notes (Signed)
DUE TO COVID-19 ONLY ONE VISITOR IS ALLOWED TO COME WITH YOU AND STAY IN THE WAITING ROOM ONLY DURING PRE OP AND PROCEDURE DAY OF SURGERY. THE 1 VISITOR  MAY VISIT WITH YOU AFTER SURGERY IN YOUR PRIVATE ROOM DURING VISITING HOURS ONLY!  YOU NEED TO HAVE A COVID 19 TEST ON__2/11/2020 _____ @_______ , THIS TEST MUST BE DONE BEFORE SURGERY,  COVID TESTING SITE [address] [place] [address], IT IS ON THE RIGHT GOING OUT [address] APPROXIMATELY  2 MINUTES PAST [employer] ON THE RIGHT. ONCE YOUR COVID TEST IS COMPLETED,  PLEASE BEGIN THE QUARANTINE INSTRUCTIONS AS OUTLINED IN YOUR HANDOUT.                [name]  02/14/2020   Your procedure is scheduled on: 02/21/2020    Report to [facility] Main  Entrance   Report to admitting at    1150 AM     Call this number if you have problems the morning of surgery [phone]    REMEMBER: NO  SOLID FOOD CANDY OR GUM AFTER MIDNIGHT. CLEAR LIQUIDS UNTIL 1120am         . NOTHING BY MOUTH EXCEPT CLEAR LIQUIDS UNTIL    . PLEASE FINISH ENSURE DRINK PER SURGEON ORDER  WHICH NEEDS TO BE COMPLETED AT  1120am     .      CLEAR LIQUID DIET   Foods Allowed                                                                    Coffee and tea, regular and decaf                            Fruit ices (not with fruit pulp)                                      Iced Popsicles                                    Carbonated beverages, regular and diet                                    Cranberry, grape and apple juices Sports drinks like Gatorade Lightly seasoned clear broth or consume(fat free) Sugar, honey syrup ___________________________________________________________________      BRUSH YOUR TEETH MORNING OF SURGERY AND RINSE YOUR MOUTH OUT, NO CHEWING GUM CANDY OR MINTS.     Take these medicines the morning of surgery with A SIP OF WATER: amlodipine, toprol, inderal if needed   DO NOT TAKE ANY DIABETIC MEDICATIONS  DAY OF YOUR SURGERY                               You may not have any metal on your body including hair pins and  piercings  Do not wear jewelry, make-up, lotions, powders or perfumes, deodorant             Do not wear nail polish on your fingernails.  Do not shave  48 hours prior to surgery.              Men may shave face and neck.   Do not bring valuables to the hospital. [place].  Contacts, dentures or bridgework may not be worn into surgery.  Leave suitcase in the car. After surgery it may be brought to your room.     Patients discharged the day of surgery will not be allowed to drive home. IF YOU ARE HAVING SURGERY AND GOING HOME THE SAME DAY, YOU MUST HAVE AN ADULT TO DRIVE YOU HOME AND BE WITH YOU FOR 24 HOURS. YOU MAY GO HOME BY TAXI OR UBER OR ORTHERWISE, BUT AN ADULT MUST ACCOMPANY YOU HOME AND STAY WITH YOU FOR 24 HOURS.  Name and phone number of your driver:  Special Instructions: N/A              Please read over the following fact sheets you were given: _____________________________________________________________________  [employer] - Preparing for Surgery Before surgery, you can play an important role.  Because skin is not sterile, your skin needs to be as free of germs as possible.  You can reduce the number of germs on your skin by washing with CHG (chlorahexidine gluconate) soap before surgery.  CHG is an antiseptic cleaner which kills germs and bonds with the skin to continue killing germs even after washing. Please DO NOT use if you have an allergy to CHG or antibacterial soaps.  If your skin becomes reddened/irritated stop using the CHG and inform your nurse when you arrive at Short Stay. Do not shave (including legs and underarms) for at least 48 hours prior to the first CHG shower.  You may shave your face/neck. Please follow these instructions carefully:  1.  Shower with CHG Soap the night before  surgery and the  morning of Surgery.  2.  If you choose to wash your hair, wash your hair first as usual with your  normal  shampoo.  3.  After you shampoo, rinse your hair and body thoroughly to remove the  shampoo.                           4.  Use CHG as you would any other liquid soap.  You can apply chg directly  to the skin and wash                       Gently with a scrungie or clean washcloth.  5.  Apply the CHG Soap to your body ONLY FROM THE NECK DOWN.   Do not use on face/ open                           Wound or open sores. Avoid contact with eyes, ears mouth and genitals (private parts).                       Wash face,  Genitals (private parts) with your normal soap.             6.  Wash thoroughly, paying special attention to the area where your surgery  will be performed.  7.  Thoroughly rinse your body with warm water from the neck down.  8.  DO NOT shower/wash with your normal soap after using and rinsing off  the CHG Soap.                9.  Pat yourself dry with a clean towel.            10.  Wear clean pajamas.            11.  Place clean sheets on your bed the night of your first shower and do not  sleep with pets. Day of Surgery : Do not apply any lotions/deodorants the morning of surgery.  Please wear clean clothes to the hospital/surgery center.  FAILURE TO FOLLOW THESE INSTRUCTIONS MAY RESULT IN THE CANCELLATION OF YOUR SURGERY PATIENT SIGNATURE_________________________________  NURSE SIGNATURE__________________________________  ________________________________________________________________________

## 2020-02-15 ENCOUNTER — Other Ambulatory Visit: Payer: Self-pay

## 2020-02-15 ENCOUNTER — Encounter (HOSPITAL_COMMUNITY): Payer: Self-pay

## 2020-02-15 ENCOUNTER — Encounter (HOSPITAL_COMMUNITY)
Admission: RE | Admit: 2020-02-15 | Discharge: 2020-02-15 | Disposition: A | Discharge status: Home / Self Care | Source: Ambulatory Visit | Attending: Orthopedic Surgery | Admitting: Orthopedic Surgery

## 2020-02-15 DIAGNOSIS — I110 Hypertensive heart disease with heart failure: Secondary | ICD-10-CM | POA: Diagnosis not present

## 2020-02-15 DIAGNOSIS — Z9989 Dependence on other enabling machines and devices: Secondary | ICD-10-CM | POA: Insufficient documentation

## 2020-02-15 DIAGNOSIS — Z79899 Other long term (current) drug therapy: Secondary | ICD-10-CM | POA: Insufficient documentation

## 2020-02-15 DIAGNOSIS — Z90722 Acquired absence of ovaries, bilateral: Secondary | ICD-10-CM | POA: Diagnosis not present

## 2020-02-15 DIAGNOSIS — I509 Heart failure, unspecified: Secondary | ICD-10-CM | POA: Diagnosis not present

## 2020-02-15 DIAGNOSIS — Z981 Arthrodesis status: Secondary | ICD-10-CM | POA: Insufficient documentation

## 2020-02-15 DIAGNOSIS — Z7901 Long term (current) use of anticoagulants: Secondary | ICD-10-CM | POA: Insufficient documentation

## 2020-02-15 DIAGNOSIS — Z9079 Acquired absence of other genital organ(s): Secondary | ICD-10-CM | POA: Diagnosis not present

## 2020-02-15 DIAGNOSIS — Z01812 Encounter for preprocedural laboratory examination: Secondary | ICD-10-CM | POA: Diagnosis not present

## 2020-02-15 DIAGNOSIS — M1612 Unilateral primary osteoarthritis, left hip: Secondary | ICD-10-CM | POA: Diagnosis not present

## 2020-02-15 DIAGNOSIS — K219 Gastro-esophageal reflux disease without esophagitis: Secondary | ICD-10-CM | POA: Diagnosis not present

## 2020-02-15 DIAGNOSIS — G4733 Obstructive sleep apnea (adult) (pediatric): Secondary | ICD-10-CM | POA: Insufficient documentation

## 2020-02-15 DIAGNOSIS — Z90710 Acquired absence of both cervix and uterus: Secondary | ICD-10-CM | POA: Insufficient documentation

## 2020-02-15 DIAGNOSIS — I480 Paroxysmal atrial fibrillation: Secondary | ICD-10-CM | POA: Insufficient documentation

## 2020-02-15 LAB — BASIC METABOLIC PANEL
Anion gap: 10 (ref 5–15)
BUN: 10 mg/dL (ref 8–23)
CO2: 24 mmol/L (ref 22–32)
Calcium: 9.2 mg/dL (ref 8.9–10.3)
Chloride: 105 mmol/L (ref 98–111)
Creatinine, Ser: 0.8 mg/dL (ref 0.44–1.00)
GFR, Estimated: >60 mL/min (ref >60)
Glucose, Bld: 96 mg/dL (ref 70–99)
Potassium: 3.9 mmol/L (ref 3.5–5.1)
Sodium: 139 mmol/L (ref 135–145)

## 2020-02-15 LAB — CBC
HCT: 44.6 % (ref 36.0–46.0)
Hemoglobin: 14.7 g/dL (ref 12.0–15.0)
MCH: 33.7 pg (ref 26.0–34.0)
MCHC: 33 g/dL (ref 30.0–36.0)
MCV: 102.3 fL — ABNORMAL HIGH (ref 80.0–100.0)
Platelets: 330 10*3/uL (ref 150–400)
RBC: 4.36 MIL/uL (ref 3.87–5.11)
RDW: 12.9 % (ref 11.5–15.5)
WBC: 4.4 10*3/uL (ref 4.0–10.5)
nRBC: 0 % (ref 0.0–0.2)

## 2020-02-15 LAB — SURGICAL PCR SCREEN
MRSA, PCR: NEGATIVE
Staphylococcus aureus: NEGATIVE

## 2020-02-15 NOTE — Progress Notes (Addendum)
Anesthesia Review:  PCP: DR London Pepper  Cardiologist : DR Park Breed 01/31/2020  Chest x-ray : EKG : 01/25/2020  Echo : 2017  Stress test:2017  Cardiac Cath :  Activity level:  Sleep Study/ CPAP : Fasting Blood Sugar :      / Checks Blood Sugar -- times a day:   Blood Thinner/ Instructions /Last Dose: ASA / Instructions/ Last Dose :  Eliquis- to stop 3 days prior to surgery per Dr Acie Fredrickson  Previous hx of difficult intubation in 2006.  Per hx states glideoscope is used.  Made Audria Nine .  No new orders given.

## 2020-02-16 NOTE — Anesthesia Preprocedure Evaluation (Addendum)
Anesthesia Evaluation  Patient identified by MRN, date of birth, ID band Patient awake    Reviewed: Allergy & Precautions, NPO status , Patient's Chart, lab work & pertinent test results  History of Anesthesia Complications (+) PONV, DIFFICULT AIRWAY and history of anesthetic complications  Airway Mallampati: III  TM Distance: <3 FB Neck ROM: Limited    Dental  (+) Partial Upper, Dental Advisory Given   Pulmonary asthma , sleep apnea ,    Pulmonary exam normal        Cardiovascular hypertension, Pt. on medications and Pt. on home beta blockers +CHF  + dysrhythmias Atrial Fibrillation  Rhythm:Regular Rate:Normal     Neuro/Psych PSYCHIATRIC DISORDERS Depression    GI/Hepatic Neg liver ROS, hiatal hernia, GERD  ,  Endo/Other  negative endocrine ROS  Renal/GU negative Renal ROS     Musculoskeletal  (+) Arthritis ,   Abdominal Normal abdominal exam  (+)   Peds  Hematology negative hematology ROS (+)   Anesthesia Other Findings   Reproductive/Obstetrics                           Anesthesia Physical Anesthesia Plan  ASA: III  Anesthesia Plan: General   Post-op Pain Management:    Induction: Intravenous  PONV Risk Score and Plan: 4 or greater and TIVA, Ondansetron, Propofol infusion and Midazolam  Airway Management Planned: Oral ETT and Video Laryngoscope Planned  Additional Equipment: None  Intra-op Plan:   Post-operative Plan: Extubation in OR  Informed Consent: I have reviewed the patients History and Physical, chart, labs and discussed the procedure including the risks, benefits and alternatives for the proposed anesthesia with the patient or authorized representative who has indicated his/her understanding and acceptance.     Dental advisory given  Plan Discussed with: CRNA  Anesthesia Plan Comments: (See PAT note 02/15/2020, Konrad Felix, PA-C)      Anesthesia Quick  Evaluation

## 2020-02-16 NOTE — Progress Notes (Signed)
Anesthesia Chart Review   Case: [id number] Date/Time: 02/21/20 1405   Procedure: TOTAL HIP ARTHROPLASTY ANTERIOR APPROACH (Left Hip) - 70 mins   Anesthesia type: Spinal   Pre-op diagnosis: Left hip osteoarthritis   Location: [facility] ROOM 10 / [facility]   Surgeons: Paralee Cancel, MD      DISCUSSION:65 y.o. never smoker with h/o PONV, GERD, HTN, PAF (on Eliquis), CHF, OSA with CPAP, left hip OA scheduled for above procedure 02/21/2020 with Dr. Paralee Cancel.   Pt last seen by cardiology 01/31/2020. Per OV note, "[name] is doing well.  She is maintaining sinus rhythm.  She is at low risk for her upcoming hip replacement.  It would be okay for her to hold the Eliquis for 2 to 3 days prior to her hip surgery."  Pt holding Eliquis 3 days per PAT nurse.   H/o difficulty intubation in 2006 with multiple uneventful glidescope intubations since.  Per 09/27/14 airway note, "Future Recommendations: Recommend- induction with short-acting agent, and alternative techniques readily available Comments: Glidescope electively used due to previous difficult intubation noted. Intubation uneventful with Glidescope."  Anticipate pt can proceed with planned procedure barring acute status change.   VS: BP 137/64   Pulse (!) 58   Temp 36.9 C (Oral)   Resp 16   SpO2 98%   PROVIDERS: London Pepper, MD is PCP   Mertie Moores, MD is Cardiologist  LABS: Labs reviewed: Acceptable for surgery. (all labs ordered are listed, but only abnormal results are displayed)  Labs Reviewed  CBC - Abnormal; Notable for the following components:      Result Value   MCV 102.3 (*)    All other components within normal limits  SURGICAL PCR SCREEN  BASIC METABOLIC PANEL  TYPE AND SCREEN     IMAGES:   EKG: 01/25/2020 Rate 57 bpm  Sinus  Bradycardia  Low voltage in precordial leads.   -RSR(V1) -nondiagnostic.   -  Nonspecific T-abnormality.  Otherwise normal  CV: Echo 04/06/2015 Study Conclusions   - Left ventricle: The  cavity size was normal. Systolic function was  normal. The estimated ejection fraction was in the range of 55%  to 60%. Wall motion was normal; there were no regional wall  motion abnormalities. Features are consistent with a pseudonormal  left ventricular filling pattern, with concomitant abnormal  relaxation and increased filling pressure (grade 2 diastolic  dysfunction).  - Aortic valve: Trileaflet; normal thickness, mildly calcified  leaflets.  - Mitral valve: There was trivial regurgitation.  - Tricuspid valve: There was trivial regurgitation.   Impressions:   - Normal LVF EF 55-60%, trivial TR, normal LA size, grade 2  diastolic dysfunction.   Stress Test 04/06/2015  Nuclear stress EF: 61%.  There was no ST segment deviation noted during stress.  The study is normal.  This is a low risk study.  The left ventricular ejection fraction is normal (55-65%).   Normal pharmacologic nuclear stress test with no evidence of prior infarct or ischemia.   Past Medical History:  Diagnosis Date  . Allergic rhinitis 04/03/2009  . Anemia    hx  . Arthritis   . Asthma, mild, intermittent 01/23/2009   Qualifier: Diagnosis of  By: Jerold Coombe - denies  . Back pain 08/03/2012  . 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 Chronic diastolic CHF (congestive heart failure) ([place]) 07/20/2015  . Depression, recurrent ([place]) 03/03/2007   Qualifier: Diagnosis of  By: Nils Pyle CMA (AAMA), Mearl Latin  -denies  . Difficult intubation  04/09/04 with difficult intubation. unabale to pass ETT with Sabra Heck 2; unable to visualize cord with MAC 3; intubating LMA used; uneventful intubation using Glidescope 09/27/14  . Diverticulosis, with history of diverticulitis of descending and sigmoid colon 03/03/2007  . Dysrhythmia    atrial fib  . Edema of both lower legs   . Essential hypertension 06/25/2006   Qualifier: Diagnosis of  By: Tiney Rouge CMA, Ellison Hughs    . Fatty liver   . Gastritis   .  GERD 03/03/2007   Qualifier: Diagnosis of  By: Nils Pyle CMA (AAMA), Mearl Latin   Qualifier: Diagnosis of  By: Shane Crutch, [name]  pt denies at preop of 02/15/20   . Hepatic steatosis, found on CT 03/03/2007      . Hiatal hernia, small 03/28/2016   EGD by [facility] 12/09/12.   . Hip pain   . History of colonic polyps 04/21/2008   Last Colonoscopy was 12/09/12, Dr. Fuller Plan. No polyp at that time. Repeat in 5 years.   . Internal hemorrhoids 03/03/2007   Last Colonoscopy was 12/09/12, Dr. Fuller Plan. Repeat in 5 years.   . Joint pain   . Lactose intolerance   . Left adrenal mass ([place]) 05/12/2013  . Menopausal vasomotor syndrome 03/28/2016  . Morbid obesity ([place]) (BMI 35 plus 2 comorbidities) 03/06/2016   Comorbid conditions include HTN, HLD, hepatic steatosis, OA, GERD, OSA.  . OSA (obstructive sleep apnea) 03/03/2007   cpap   . PAF (paroxysmal atrial fibrillation) ([place]) 04/24/2015  . Palpitations   . PONV (postoperative nausea and vomiting)    area on left forearm DO NOT PLACE IV OR STICK ? vascular"will bleed"  . Umbilical hernia without obstruction or gangrene, fat containing 03/28/2016   Found on CT, 2017. No issues.    Past Surgical History:  Procedure Laterality Date  . ABDOMINAL HYSTERECTOMY    . APPENDECTOMY    . BREAST REDUCTION SURGERY    . BUNIONECTOMY     bilateral  . CERVICAL FUSION    . CHOLECYSTECTOMY N/A 10/01/2016   Procedure: LAPAROSCOPIC CHOLECYSTECTOMY;  Surgeon: Coralie Keens, MD;  Location: [place];  Service: General;  Laterality: N/A;  . COLONOSCOPY WITH PROPOFOL N/A 05/12/2019   Procedure: COLONOSCOPY WITH PROPOFOL;  Surgeon: Juanita Craver, MD;  [facility];  Service: Endoscopy;  Laterality: N/A;  . MAXIMUM ACCESS (MAS)POSTERIOR LUMBAR INTERBODY FUSION (PLIF) 2 LEVEL N/A 09/27/2014   Procedure: Maximum Access Surgery Posterior Lumbar Interbody Fusion Lumbar three-four, Lumbar four-five;  Surgeon: Eustace Moore, MD;  Location: [place] [facility];  Service: Neurosurgery;  Laterality: N/A;   . POLYPECTOMY  05/12/2019   Procedure: POLYPECTOMY;  Surgeon: Juanita Craver, MD;  [facility];  Service: Endoscopy;;  . SPINE SURGERY    . TOTAL ABDOMINAL HYSTERECTOMY W/ BILATERAL SALPINGOOPHORECTOMY      MEDICATIONS: . amLODipine (NORVASC) 5 MG tablet  . apixaban (ELIQUIS) 5 MG TABS tablet  . Calcium Carbonate-Vitamin D 600-400 MG-UNIT tablet  . ELDERBERRY PO  . Magnesium 250 MG TABS  . metoprolol succinate (TOPROL-XL) 25 MG 24 hr tablet  . Multiple Vitamin (MULTIVITAMIN WITH MINERALS) TABS tablet  . Multiple Vitamins-Minerals (HAIR SKIN AND NAILS FORMULA PO)  . Probiotic Product ([place])  . propranolol (INDERAL) 10 MG tablet  . Turmeric 500 MG CAPS   No current facility-administered medications for this encounter.    Konrad Felix, PA-C [facility] [phone]

## 2020-02-18 ENCOUNTER — Other Ambulatory Visit (HOSPITAL_COMMUNITY)
Admission: RE | Admit: 2020-02-18 | Discharge: 2020-02-18 | Disposition: A | Discharge status: Home / Self Care | Source: Ambulatory Visit | Attending: Orthopedic Surgery | Admitting: Orthopedic Surgery

## 2020-02-18 DIAGNOSIS — Z01812 Encounter for preprocedural laboratory examination: Secondary | ICD-10-CM | POA: Insufficient documentation

## 2020-02-18 DIAGNOSIS — Z20822 Contact with and (suspected) exposure to covid-19: Secondary | ICD-10-CM | POA: Diagnosis not present

## 2020-02-19 LAB — SARS CORONAVIRUS 2 (TAT 6-24 HRS): SARS Coronavirus 2: NEGATIVE

## 2020-02-19 NOTE — H&P (Signed)
TOTAL HIP ADMISSION H&P  Patient is admitted for left total hip arthroplasty, anterior approach.  Subjective:  Chief Complaint: left hip OA / pain  HPI: [name], 65 y.o. female, has a history of pain and functional disability in the left hip(s) due to arthritis and patient has failed non-surgical conservative treatments for greater than 12 weeks to include NSAID's and/or analgesics, corticosteriod injections, use of assistive devices and activity modification.  Onset of symptoms was gradual starting <1 year ago with gradually worsening course since that time.The patient noted no past surgery on the left hip(s).  Patient currently rates pain in the left hip at 9 out of 10 with activity. Patient has night pain, worsening of pain with activity and weight bearing, pain that interfers with activities of daily living and pain with passive range of motion. Patient has evidence of periarticular osteophytes and joint space narrowing by imaging studies. This condition presents safety issues increasing the risk of falls.  There is no current active infection.  Risks, benefits and expectations were discussed with the patient.  Risks including but not limited to the risk of anesthesia, blood clots, nerve damage, blood vessel damage, failure of the prosthesis, infection and up to and including death.  Patient understand the risks, benefits and expectations and wishes to proceed with surgery.   D/C Plans:       Home (obs)  Post-op Meds:       No Rx given   Tranexamic Acid:      To be given - IV   Decadron:      Is to be given  FYI:      Eliquis (on pre-op - a-fib)  Norco  DME: Rx sent for - RW & 3-N-1  PT:   HEP  Pharmacy: [facility] [address]    Patient Active Problem List   Diagnosis Date Noted  . Biliary dyskinesia 10/01/2016  . History of hysterectomy 03/28/2016  . History of appendectomy 03/28/2016  . Umbilical hernia without obstruction or gangrene, fat containing  03/28/2016  . History of fusion of cervical spine 03/28/2016  . Hiatal hernia, small 03/28/2016  . Menopausal vasomotor syndrome 03/28/2016  . Forearm mass, left 03/28/2016  . Morbid obesity ([place]) (BMI 35 plus 2 comorbidities) 03/06/2016  . Chronic diastolic CHF (congestive heart failure) ([place]) 07/20/2015  . PAF (paroxysmal atrial fibrillation) ([place]) 04/24/2015  . History of lumbar spinal fusion 09/27/2014  . 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 Left adrenal mass ([place]) 05/12/2013  . Back pain 08/03/2012  . Allergic rhinitis 04/03/2009  . Asthma, mild, intermittent 01/23/2009  . History of colonic polyps 04/21/2008  . Depression, recurrent ([place]) 03/03/2007  . Internal hemorrhoids 03/03/2007  . GERD 03/03/2007  . Diverticulosis, with history of diverticulitis of descending and sigmoid colon 03/03/2007  . Hepatic steatosis, found on CT 03/03/2007  . OSA (obstructive sleep apnea) 03/03/2007  . Essential hypertension 06/25/2006   Past Medical History:  Diagnosis Date  . Allergic rhinitis 04/03/2009  . Anemia    hx  . Arthritis   . Asthma, mild, intermittent 01/23/2009   Qualifier: Diagnosis of  By: Jerold Coombe - denies  . Back pain 08/03/2012  . 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 Chronic diastolic CHF (congestive heart failure) ([place]) 07/20/2015  . Depression, recurrent ([place]) 03/03/2007   Qualifier: Diagnosis of  By: Nils Pyle CMA (AAMA), Mearl Latin  -denies  . Difficult intubation    04/09/04 with difficult intubation. unabale to pass ETT with  Miller 2; unable to visualize cord with MAC 3; intubating LMA used; uneventful intubation using Glidescope 09/27/14  . Diverticulosis, with history of diverticulitis of descending and sigmoid colon 03/03/2007  . Dysrhythmia    atrial fib  . Edema of both lower legs   . Essential hypertension 06/25/2006   Qualifier: Diagnosis of  By: Tiney Rouge CMA, Ellison Hughs    . Fatty liver   . Gastritis   . GERD 03/03/2007   Qualifier:  Diagnosis of  By: Nils Pyle CMA (AAMA), Mearl Latin   Qualifier: Diagnosis of  By: Shane Crutch, [name]  pt denies at preop of 02/15/20   . Hepatic steatosis, found on CT 03/03/2007      . Hiatal hernia, small 03/28/2016   [facility] 12/09/12.   . Hip pain   . History of colonic polyps 04/21/2008   Last Colonoscopy was 12/09/12, Dr. Fuller Plan. No polyp at that time. Repeat in 5 years.   . Internal hemorrhoids 03/03/2007   Last Colonoscopy was 12/09/12, Dr. Fuller Plan. Repeat in 5 years.   . Joint pain   . Lactose intolerance   . Left adrenal mass ([place]) 05/12/2013  . Menopausal vasomotor syndrome 03/28/2016  . Morbid obesity ([place]) (BMI 35 plus 2 comorbidities) 03/06/2016   Comorbid conditions include HTN, HLD, hepatic steatosis, OA, GERD, OSA.  . OSA (obstructive sleep apnea) 03/03/2007   cpap   . PAF (paroxysmal atrial fibrillation) ([place]) 04/24/2015  . Palpitations   . PONV (postoperative nausea and vomiting)    area on left forearm DO NOT PLACE IV OR STICK ? vascular"will bleed"  . Umbilical hernia without obstruction or gangrene, fat containing 03/28/2016   Found on CT, 2017. No issues.    Past Surgical History:  Procedure Laterality Date  . ABDOMINAL HYSTERECTOMY    . APPENDECTOMY    . BREAST REDUCTION SURGERY    . BUNIONECTOMY     bilateral  . CERVICAL FUSION    . CHOLECYSTECTOMY N/A 10/01/2016   Procedure: LAPAROSCOPIC CHOLECYSTECTOMY;  Surgeon: Coralie Keens, MD;  Location: [place];  Service: General;  Laterality: N/A;  . COLONOSCOPY WITH PROPOFOL N/A 05/12/2019   Procedure: COLONOSCOPY WITH PROPOFOL;  Surgeon: Juanita Craver, MD;  [facility];  Service: Endoscopy;  Laterality: N/A;  . MAXIMUM ACCESS (MAS)POSTERIOR LUMBAR INTERBODY FUSION (PLIF) 2 LEVEL N/A 09/27/2014   Procedure: Maximum Access Surgery Posterior Lumbar Interbody Fusion Lumbar three-four, Lumbar four-five;  Surgeon: Eustace Moore, MD;  Location: [place] [facility];  Service: Neurosurgery;  Laterality: N/A;  . POLYPECTOMY  05/12/2019    Procedure: POLYPECTOMY;  Surgeon: Juanita Craver, MD;  [facility];  Service: Endoscopy;;  . SPINE SURGERY    . TOTAL ABDOMINAL HYSTERECTOMY W/ BILATERAL SALPINGOOPHORECTOMY      No current facility-administered medications for this encounter.   Current Outpatient Medications  Medication Sig Dispense Refill Last Dose  . amLODipine (NORVASC) 5 MG tablet Take 1 tablet (5 mg total) by mouth daily. 90 tablet 3   . apixaban (ELIQUIS) 5 MG TABS tablet Take 1 tablet (5 mg total) by mouth 2 (two) times daily. 180 tablet 1   . Calcium Carbonate-Vitamin D 600-400 MG-UNIT tablet Take 1 tablet by mouth daily.     [name] ELDERBERRY PO Take 2 each by mouth daily.     . Magnesium 250 MG TABS Take 250 mg by mouth daily.     . metoprolol succinate (TOPROL-XL) 25 MG 24 hr tablet Take 1 tablet (25 mg total) by mouth daily. 90 tablet  3   . Multiple Vitamin (MULTIVITAMIN WITH MINERALS) TABS tablet Take 1 tablet by mouth daily. Alive     . Multiple Vitamins-Minerals (HAIR SKIN AND NAILS FORMULA PO) Take 1 capsule by mouth in the morning and at bedtime.     . Probiotic Product (ULTRAFLORA IMMUNE HEALTH PO) Take 1 tablet by mouth daily.     . propranolol (INDERAL) 10 MG tablet Take 1 tablet (10 mg total) by mouth 4 (four) times daily as needed. (Patient taking differently: Take 10 mg by mouth 4 (four) times daily as needed (AFIB).) 360 tablet 3   . Turmeric 500 MG CAPS Take 500 mg by mouth daily.      Allergies  Allergen Reactions  . Other Other (See Comments)    ekg pads burns skin needs hypoallergenic  . Losartan Other (See Comments)    Chest pain.   . Tramadol Other (See Comments)    GI upset, "I felt like I was in a fog"    Social History   Tobacco Use  . Smoking status: Never Smoker  . Smokeless tobacco: Never Used  Substance Use Topics  . Alcohol use: No    Family History  Problem Relation Age of Onset  . Diabetes Mother   . Hypertension Mother   . Heart disease Mother   . Sleep apnea  Mother   . Coronary artery disease Sister   . Hypertension Sister   . Colon cancer Neg Hx      Review of Systems  Constitutional: Negative.   HENT: Negative.   Eyes: Negative.   Respiratory: Negative.   Cardiovascular: Negative.   Gastrointestinal: Positive for heartburn.  Genitourinary: Negative.   Musculoskeletal: Positive for joint pain.  Skin: Negative.   Neurological: Negative.   Endo/Heme/Allergies: Negative.   Psychiatric/Behavioral: Negative.      Objective:  Physical Exam Constitutional:      Appearance: She is well-developed.  HENT:     Head: Normocephalic.  Eyes:     Pupils: Pupils are equal, round, and reactive to light.  Neck:     Thyroid: No thyromegaly.     Vascular: No JVD.     Trachea: No tracheal deviation.  Cardiovascular:     Rate and Rhythm: Normal rate and regular rhythm.     Pulses: Intact distal pulses.  Pulmonary:     Effort: Pulmonary effort is normal. No respiratory distress.     Breath sounds: Normal breath sounds. No wheezing.  Abdominal:     Palpations: Abdomen is soft.     Tenderness: There is no abdominal tenderness. There is no guarding.  Musculoskeletal:     Cervical back: Neck supple.     Left hip: Tenderness and bony tenderness present. No deformity. Decreased range of motion. Decreased strength.  Lymphadenopathy:     Cervical: No cervical adenopathy.  Skin:    General: Skin is warm and dry.  Neurological:     Mental Status: She is alert and oriented to person, place, and time.  Psychiatric:        Mood and Affect: Mood and affect normal.      Labs:  Estimated body mass index is 37.44 kg/m as calculated from the following:   Height as of 02/08/20: _0  (1.651 m).   Weight as of 02/08/20: 102.1 kg.   Imaging Review Plain radiographs demonstrate severe degenerative joint disease of the left hip(s). The bone quality appears to be good for age and reported activity level.  Assessment/Plan:  End stage  arthritis, left hip(s)  The patient history, physical examination, clinical judgement of the provider and imaging studies are consistent with end stage degenerative joint disease of the left hip(s) and total hip arthroplasty is deemed medically necessary. The treatment options including medical management, injection therapy, arthroscopy and arthroplasty were discussed at length. The risks and benefits of total hip arthroplasty were presented and reviewed. The risks due to aseptic loosening, infection, stiffness, dislocation/subluxation,  thromboembolic complications and other imponderables were discussed.  The patient acknowledged the explanation, agreed to proceed with the plan and consent was signed. Patient is being admitted for inpatient treatment for surgery, pain control, PT, OT, prophylactic antibiotics, VTE prophylaxis, progressive ambulation and ADL's and discharge planning.The patient is planning to be discharged home.   [place] Corlene Sabia   PA-C  02/19/2020, 7:46 PM

## 2020-02-21 ENCOUNTER — Ambulatory Visit (HOSPITAL_COMMUNITY)

## 2020-02-21 ENCOUNTER — Encounter (HOSPITAL_COMMUNITY)
Admission: RE | Disposition: A | Discharge status: Home / Self Care | Payer: Self-pay | Source: Home / Self Care | Attending: Orthopedic Surgery

## 2020-02-21 ENCOUNTER — Ambulatory Visit (HOSPITAL_COMMUNITY): Admitting: Physician Assistant

## 2020-02-21 ENCOUNTER — Other Ambulatory Visit: Payer: Self-pay

## 2020-02-21 ENCOUNTER — Encounter (HOSPITAL_COMMUNITY): Payer: Self-pay | Admitting: Orthopedic Surgery

## 2020-02-21 ENCOUNTER — Observation Stay (HOSPITAL_COMMUNITY)
Admission: RE | Admit: 2020-02-21 | Discharge: 2020-02-22 | Disposition: A | Discharge status: Home / Self Care | Attending: Orthopedic Surgery | Admitting: Orthopedic Surgery

## 2020-02-21 ENCOUNTER — Ambulatory Visit (HOSPITAL_COMMUNITY): Admitting: Certified Registered Nurse Anesthetist

## 2020-02-21 DIAGNOSIS — I480 Paroxysmal atrial fibrillation: Secondary | ICD-10-CM | POA: Insufficient documentation

## 2020-02-21 DIAGNOSIS — I110 Hypertensive heart disease with heart failure: Secondary | ICD-10-CM | POA: Insufficient documentation

## 2020-02-21 DIAGNOSIS — Z86010 Personal history of colonic polyps: Secondary | ICD-10-CM | POA: Diagnosis not present

## 2020-02-21 DIAGNOSIS — Z7901 Long term (current) use of anticoagulants: Secondary | ICD-10-CM | POA: Diagnosis not present

## 2020-02-21 DIAGNOSIS — I5032 Chronic diastolic (congestive) heart failure: Secondary | ICD-10-CM | POA: Insufficient documentation

## 2020-02-21 DIAGNOSIS — J45909 Unspecified asthma, uncomplicated: Secondary | ICD-10-CM | POA: Diagnosis not present

## 2020-02-21 DIAGNOSIS — Z79899 Other long term (current) drug therapy: Secondary | ICD-10-CM | POA: Insufficient documentation

## 2020-02-21 DIAGNOSIS — Z96649 Presence of unspecified artificial hip joint: Secondary | ICD-10-CM

## 2020-02-21 DIAGNOSIS — M1612 Unilateral primary osteoarthritis, left hip: Secondary | ICD-10-CM | POA: Diagnosis not present

## 2020-02-21 DIAGNOSIS — Z96642 Presence of left artificial hip joint: Secondary | ICD-10-CM

## 2020-02-21 DIAGNOSIS — Z419 Encounter for procedure for purposes other than remedying health state, unspecified: Secondary | ICD-10-CM

## 2020-02-21 DIAGNOSIS — M25552 Pain in left hip: Secondary | ICD-10-CM | POA: Diagnosis present

## 2020-02-21 HISTORY — PX: TOTAL HIP ARTHROPLASTY: SHX124

## 2020-02-21 LAB — TYPE AND SCREEN
ABO/RH(D): A POS
Antibody Screen: NEGATIVE

## 2020-02-21 SURGERY — ARTHROPLASTY, HIP, TOTAL, ANTERIOR APPROACH
Anesthesia: General | Site: Hip | Laterality: Left

## 2020-02-21 MED ORDER — DEXAMETHASONE SODIUM PHOSPHATE 10 MG/ML IJ SOLN
10.0000 mg | Freq: Once | INTRAMUSCULAR | Status: AC
  Administered 2020-02-22: 10 mg via INTRAVENOUS
  Filled 2020-02-21: qty 1

## 2020-02-21 MED ORDER — HYDROCODONE-ACETAMINOPHEN 5-325 MG PO TABS
1.0000 | ORAL_TABLET | ORAL | Status: DC | PRN
  Administered 2020-02-21: 2 via ORAL
  Filled 2020-02-21 (×2): qty 2

## 2020-02-21 MED ORDER — STERILE WATER FOR IRRIGATION IR SOLN
Status: DC | PRN
  Administered 2020-02-21: 2000 mL

## 2020-02-21 MED ORDER — TRANEXAMIC ACID-NACL 1000-0.7 MG/100ML-% IV SOLN
1000.0000 mg | Freq: Once | INTRAVENOUS | Status: AC
  Administered 2020-02-21: 1000 mg via INTRAVENOUS
  Filled 2020-02-21: qty 100

## 2020-02-21 MED ORDER — LIDOCAINE 2% (20 MG/ML) 5 ML SYRINGE
INTRAMUSCULAR | Status: DC | PRN
  Administered 2020-02-21: 40 mg via INTRAVENOUS

## 2020-02-21 MED ORDER — CHLORHEXIDINE GLUCONATE 0.12 % MT SOLN
15.0000 mL | Freq: Once | OROMUCOSAL | Status: AC
  Administered 2020-02-21: 15 mL via OROMUCOSAL

## 2020-02-21 MED ORDER — HYDROCODONE-ACETAMINOPHEN 7.5-325 MG PO TABS
1.0000 | ORAL_TABLET | ORAL | Status: DC | PRN
  Administered 2020-02-22 (×2): 2 via ORAL
  Filled 2020-02-21 (×2): qty 2

## 2020-02-21 MED ORDER — PROPOFOL 500 MG/50ML IV EMUL
INTRAVENOUS | Status: DC | PRN
Start: 2020-02-21 — End: 2020-02-21
  Administered 2020-02-21: 150 ug/kg/min via INTRAVENOUS
  Administered 2020-02-21: 110 ug/kg/min via INTRAVENOUS

## 2020-02-21 MED ORDER — PROPOFOL 10 MG/ML IV BOLUS
INTRAVENOUS | Status: DC | PRN
Start: 2020-02-21 — End: 2020-02-21
  Administered 2020-02-21: 160 mg via INTRAVENOUS

## 2020-02-21 MED ORDER — SUCCINYLCHOLINE CHLORIDE 200 MG/10ML IV SOSY
PREFILLED_SYRINGE | INTRAVENOUS | Status: DC | PRN
  Administered 2020-02-21: 140 mg via INTRAVENOUS

## 2020-02-21 MED ORDER — HYDROMORPHONE HCL 1 MG/ML IJ SOLN
0.2500 mg | INTRAMUSCULAR | Status: DC | PRN
  Administered 2020-02-21 (×3): 0.25 mg via INTRAVENOUS

## 2020-02-21 MED ORDER — HYDROMORPHONE HCL 1 MG/ML IJ SOLN
0.5000 mg | INTRAMUSCULAR | Status: DC | PRN
  Administered 2020-02-21: 1 mg via INTRAVENOUS
  Administered 2020-02-22: 0.5 mg via INTRAVENOUS
  Filled 2020-02-21 (×2): qty 1

## 2020-02-21 MED ORDER — DEXAMETHASONE SODIUM PHOSPHATE 10 MG/ML IJ SOLN
10.0000 mg | Freq: Once | INTRAMUSCULAR | Status: AC
  Administered 2020-02-21: 8 mg via INTRAVENOUS

## 2020-02-21 MED ORDER — MIDAZOLAM HCL 2 MG/2ML IJ SOLN
INTRAMUSCULAR | Status: AC
  Filled 2020-02-21: qty 2

## 2020-02-21 MED ORDER — FENTANYL CITRATE (PF) 100 MCG/2ML IJ SOLN
INTRAMUSCULAR | Status: AC
  Filled 2020-02-21: qty 2

## 2020-02-21 MED ORDER — BISACODYL 10 MG RE SUPP: 10.0000 mg | Freq: Every day | RECTAL | Status: DC | PRN

## 2020-02-21 MED ORDER — METHOCARBAMOL 500 MG IVPB - SIMPLE MED
INTRAVENOUS | Status: AC
  Administered 2020-02-21: 500 mg via INTRAVENOUS
  Filled 2020-02-21: qty 50

## 2020-02-21 MED ORDER — METHOCARBAMOL 500 MG PO TABS
500.0000 mg | ORAL_TABLET | Freq: Four times a day (QID) | ORAL | Status: DC | PRN
  Administered 2020-02-22: 500 mg via ORAL
  Filled 2020-02-21: qty 1

## 2020-02-21 MED ORDER — CEFAZOLIN SODIUM-DEXTROSE 2-4 GM/100ML-% IV SOLN
2.0000 g | Freq: Four times a day (QID) | INTRAVENOUS | Status: AC
  Administered 2020-02-21 (×2): 2 g via INTRAVENOUS
  Filled 2020-02-21 (×2): qty 100

## 2020-02-21 MED ORDER — SODIUM CHLORIDE 0.9 % IR SOLN
Status: DC | PRN
  Administered 2020-02-21: 1000 mL

## 2020-02-21 MED ORDER — MIDAZOLAM HCL 5 MG/5ML IJ SOLN
INTRAMUSCULAR | Status: DC | PRN
  Administered 2020-02-21: 2 mg via INTRAVENOUS

## 2020-02-21 MED ORDER — METOPROLOL SUCCINATE ER 25 MG PO TB24
25.0000 mg | ORAL_TABLET | Freq: Every day | ORAL | Status: DC
  Administered 2020-02-22: 25 mg via ORAL
  Filled 2020-02-21: qty 1

## 2020-02-21 MED ORDER — LACTATED RINGERS IV SOLN: INTRAVENOUS | Status: DC

## 2020-02-21 MED ORDER — FERROUS SULFATE 325 (65 FE) MG PO TABS
325.0000 mg | ORAL_TABLET | Freq: Three times a day (TID) | ORAL | Status: DC
  Administered 2020-02-22: 325 mg via ORAL
  Filled 2020-02-21: qty 1

## 2020-02-21 MED ORDER — TRANEXAMIC ACID-NACL 1000-0.7 MG/100ML-% IV SOLN
1000.0000 mg | INTRAVENOUS | Status: DC
  Filled 2020-02-21: qty 100

## 2020-02-21 MED ORDER — MEPERIDINE HCL 50 MG/ML IJ SOLN: 6.2500 mg | INTRAMUSCULAR | Status: DC | PRN

## 2020-02-21 MED ORDER — APIXABAN 2.5 MG PO TABS
2.5000 mg | ORAL_TABLET | Freq: Two times a day (BID) | ORAL | Status: DC
  Administered 2020-02-22: 2.5 mg via ORAL
  Filled 2020-02-21: qty 1

## 2020-02-21 MED ORDER — AMLODIPINE BESYLATE 5 MG PO TABS
5.0000 mg | ORAL_TABLET | Freq: Every day | ORAL | Status: DC
  Administered 2020-02-22: 5 mg via ORAL
  Filled 2020-02-21: qty 1

## 2020-02-21 MED ORDER — HYDROMORPHONE HCL 1 MG/ML IJ SOLN
INTRAMUSCULAR | Status: AC
  Administered 2020-02-21: 0.5 mg via INTRAVENOUS
  Filled 2020-02-21: qty 1

## 2020-02-21 MED ORDER — SODIUM CHLORIDE 0.9 % IV SOLN: INTRAVENOUS | Status: DC

## 2020-02-21 MED ORDER — ACETAMINOPHEN 10 MG/ML IV SOLN: 1000.0000 mg | Freq: Once | INTRAVENOUS | Status: DC | PRN

## 2020-02-21 MED ORDER — MAGNESIUM CITRATE PO SOLN
1.0000 | Freq: Once | ORAL | Status: DC | PRN
Start: 2020-02-21 — End: 2020-02-22

## 2020-02-21 MED ORDER — CEFAZOLIN SODIUM-DEXTROSE 2-4 GM/100ML-% IV SOLN
2.0000 g | INTRAVENOUS | Status: AC
  Administered 2020-02-21: 2 g via INTRAVENOUS
  Filled 2020-02-21: qty 100

## 2020-02-21 MED ORDER — ROCURONIUM BROMIDE 10 MG/ML (PF) SYRINGE
PREFILLED_SYRINGE | INTRAVENOUS | Status: DC | PRN
  Administered 2020-02-21: 70 mg via INTRAVENOUS

## 2020-02-21 MED ORDER — ONDANSETRON HCL 4 MG/2ML IJ SOLN
INTRAMUSCULAR | Status: AC
  Filled 2020-02-21: qty 2

## 2020-02-21 MED ORDER — ONDANSETRON HCL 4 MG PO TABS: 4.0000 mg | ORAL_TABLET | Freq: Four times a day (QID) | ORAL | Status: DC | PRN

## 2020-02-21 MED ORDER — ALUM & MAG HYDROXIDE-SIMETH 200-200-20 MG/5ML PO SUSP: 15.0000 mL | ORAL | Status: DC | PRN

## 2020-02-21 MED ORDER — ONDANSETRON HCL 4 MG/2ML IJ SOLN
INTRAMUSCULAR | Status: DC | PRN
  Administered 2020-02-21: 4 mg via INTRAVENOUS

## 2020-02-21 MED ORDER — METOCLOPRAMIDE HCL 5 MG PO TABS: 5.0000 mg | ORAL_TABLET | Freq: Three times a day (TID) | ORAL | Status: DC | PRN

## 2020-02-21 MED ORDER — AMISULPRIDE (ANTIEMETIC) 5 MG/2ML IV SOLN: 10.0000 mg | Freq: Once | INTRAVENOUS | Status: DC | PRN

## 2020-02-21 MED ORDER — METOCLOPRAMIDE HCL 5 MG/ML IJ SOLN: 5.0000 mg | Freq: Three times a day (TID) | INTRAMUSCULAR | Status: DC | PRN

## 2020-02-21 MED ORDER — PHENOL 1.4 % MT LIQD: 1.0000 | OROMUCOSAL | Status: DC | PRN

## 2020-02-21 MED ORDER — DOCUSATE SODIUM 100 MG PO CAPS
100.0000 mg | ORAL_CAPSULE | Freq: Two times a day (BID) | ORAL | Status: DC
  Administered 2020-02-22: 100 mg via ORAL
  Filled 2020-02-21 (×2): qty 1

## 2020-02-21 MED ORDER — ACETAMINOPHEN 160 MG/5ML PO SOLN: 325.0000 mg | Freq: Once | ORAL | Status: DC | PRN

## 2020-02-21 MED ORDER — ONDANSETRON HCL 4 MG/2ML IJ SOLN
4.0000 mg | Freq: Four times a day (QID) | INTRAMUSCULAR | Status: DC | PRN
  Administered 2020-02-21: 4 mg via INTRAVENOUS
  Filled 2020-02-21: qty 2

## 2020-02-21 MED ORDER — HYDROMORPHONE HCL 1 MG/ML IJ SOLN
INTRAMUSCULAR | Status: AC
  Administered 2020-02-21: 0.25 mg via INTRAVENOUS
  Filled 2020-02-21: qty 1

## 2020-02-21 MED ORDER — PROPOFOL 10 MG/ML IV BOLUS
INTRAVENOUS | Status: AC
  Filled 2020-02-21: qty 20

## 2020-02-21 MED ORDER — ORAL CARE MOUTH RINSE: 15.0000 mL | Freq: Once | OROMUCOSAL | Status: AC

## 2020-02-21 MED ORDER — FENTANYL CITRATE (PF) 250 MCG/5ML IJ SOLN
INTRAMUSCULAR | Status: DC | PRN
  Administered 2020-02-21: 100 ug via INTRAVENOUS

## 2020-02-21 MED ORDER — DIPHENHYDRAMINE HCL 50 MG/ML IJ SOLN
INTRAMUSCULAR | Status: DC | PRN
  Administered 2020-02-21: 12.5 mg via INTRAVENOUS

## 2020-02-21 MED ORDER — ACETAMINOPHEN 325 MG PO TABS: 325.0000 mg | ORAL_TABLET | Freq: Once | ORAL | Status: DC | PRN

## 2020-02-21 MED ORDER — PROPRANOLOL HCL 10 MG PO TABS
10.0000 mg | ORAL_TABLET | Freq: Four times a day (QID) | ORAL | Status: DC | PRN
  Filled 2020-02-21: qty 1

## 2020-02-21 MED ORDER — DEXAMETHASONE SODIUM PHOSPHATE 10 MG/ML IJ SOLN
INTRAMUSCULAR | Status: AC
  Filled 2020-02-21: qty 1

## 2020-02-21 MED ORDER — DIPHENHYDRAMINE HCL 12.5 MG/5ML PO ELIX: 12.5000 mg | ORAL_SOLUTION | ORAL | Status: DC | PRN

## 2020-02-21 MED ORDER — SUGAMMADEX SODIUM 200 MG/2ML IV SOLN
INTRAVENOUS | Status: DC | PRN
  Administered 2020-02-21: 300 mg via INTRAVENOUS

## 2020-02-21 MED ORDER — POLYETHYLENE GLYCOL 3350 17 G PO PACK
17.0000 g | PACK | Freq: Two times a day (BID) | ORAL | Status: DC
  Administered 2020-02-22: 17 g via ORAL
  Filled 2020-02-21 (×2): qty 1

## 2020-02-21 MED ORDER — METHOCARBAMOL 500 MG IVPB - SIMPLE MED
500.0000 mg | Freq: Four times a day (QID) | INTRAVENOUS | Status: DC | PRN
  Filled 2020-02-21: qty 50

## 2020-02-21 MED ORDER — ACETAMINOPHEN 10 MG/ML IV SOLN
INTRAVENOUS | Status: AC
  Administered 2020-02-21: 1000 mg via INTRAVENOUS
  Filled 2020-02-21: qty 100

## 2020-02-21 MED ORDER — MENTHOL 3 MG MT LOZG: 1.0000 | LOZENGE | OROMUCOSAL | Status: DC | PRN

## 2020-02-21 SURGICAL SUPPLY — 47 items
ADH SKN CLS APL DERMABOND .7 (GAUZE/BANDAGES/DRESSINGS) ×1
BAG DECANTER FOR FLEXI CONT (MISCELLANEOUS) IMPLANT
BAG SPEC THK2 15X12 ZIP CLS (MISCELLANEOUS)
BAG ZIPLOCK 12X15 (MISCELLANEOUS) IMPLANT
BALL HIP CERAMIC (Hips) IMPLANT
BLADE SAG 18X100X1.27 (BLADE) ×2 IMPLANT
BLADE SURG SZ10 CARB STEEL (BLADE) ×4 IMPLANT
COVER PERINEAL POST (MISCELLANEOUS) ×2 IMPLANT
COVER SURGICAL LIGHT HANDLE (MISCELLANEOUS) ×2 IMPLANT
COVER WAND RF STERILE (DRAPES) IMPLANT
CUP ACET PINNACLE SECTR 50MM (Hips) IMPLANT
DERMABOND ADVANCED (GAUZE/BANDAGES/DRESSINGS) ×1
DERMABOND ADVANCED .7 DNX12 (GAUZE/BANDAGES/DRESSINGS) ×1 IMPLANT
DRAPE STERI IOBAN 125X83 (DRAPES) ×2 IMPLANT
DRAPE U-SHAPE 47X51 STRL (DRAPES) ×4 IMPLANT
DRESSING AQUACEL AG SP 3.5X10 (GAUZE/BANDAGES/DRESSINGS) ×1 IMPLANT
DRSG AQUACEL AG SP 3.5X10 (GAUZE/BANDAGES/DRESSINGS) ×2
DURAPREP 26ML APPLICATOR (WOUND CARE) ×2 IMPLANT
ELECT REM PT RETURN 15FT ADLT (MISCELLANEOUS) ×2 IMPLANT
ELIMINATOR HOLE APEX DEPUY (Hips) ×1 IMPLANT
GLOVE ECLIPSE 8.0 STRL XLNG CF (GLOVE) ×4 IMPLANT
GLOVE ORTHO TXT STRL SZ7.5 (GLOVE) ×4 IMPLANT
GLOVE SURG ENC MOIS LTX SZ6 (GLOVE) ×4 IMPLANT
GLOVE SURG UNDER POLY LF SZ6.5 (GLOVE) ×2 IMPLANT
GLOVE SURG UNDER POLY LF SZ7.5 (GLOVE) ×2 IMPLANT
GLOVE SURG UNDER POLY LF SZ8.5 (GLOVE) ×2 IMPLANT
GOWN STRL REUS W/TWL LRG LVL3 (GOWN DISPOSABLE) ×4 IMPLANT
GOWN STRL REUS W/TWL XL LVL3 (GOWN DISPOSABLE) ×2 IMPLANT
HIP BALL CERAMIC (Hips) ×2 IMPLANT
HOLDER FOLEY CATH W/STRAP (MISCELLANEOUS) ×2 IMPLANT
KIT TURNOVER KIT A (KITS) ×2 IMPLANT
LINER ACET PNNCL PLUS4 NEUTRAL (Hips) IMPLANT
PACK ANTERIOR HIP CUSTOM (KITS) ×2 IMPLANT
PENCIL SMOKE EVACUATOR (MISCELLANEOUS) IMPLANT
PINNACLE PLUS 4 NEUTRAL (Hips) ×2 IMPLANT
PINNACLE SECTOR CUP 50MM (Hips) ×2 IMPLANT
SCREW 6.5MMX25MM (Screw) ×1 IMPLANT
STEM FEM ACTIS HIGH SZ2 (Stem) ×1 IMPLANT
SUT MNCRL AB 4-0 PS2 18 (SUTURE) ×2 IMPLANT
SUT STRATAFIX 0 PDS 27 VIOLET (SUTURE) ×2
SUT VIC AB 1 CT1 36 (SUTURE) ×6 IMPLANT
SUT VIC AB 2-0 CT1 27 (SUTURE) ×4
SUT VIC AB 2-0 CT1 TAPERPNT 27 (SUTURE) ×2 IMPLANT
SUTURE STRATFX 0 PDS 27 VIOLET (SUTURE) ×1 IMPLANT
TRAY FOLEY MTR SLVR 14FR STAT (SET/KITS/TRAYS/PACK) ×1 IMPLANT
TUBE SUCTION HIGH CAP CLEAR NV (SUCTIONS) ×2 IMPLANT
WATER STERILE IRR 1000ML POUR (IV SOLUTION) ×2 IMPLANT

## 2020-02-21 NOTE — Discharge Instructions (Signed)

## 2020-02-21 NOTE — Anesthesia Procedure Notes (Signed)
Procedure Name: Intubation Performed by: Rosaland Lao, CRNA Pre-anesthesia Checklist: Patient identified, Emergency Drugs available, Suction available and Patient being monitored Patient Re-evaluated:Patient Re-evaluated prior to induction Oxygen Delivery Method: Circle system utilized Preoxygenation: Pre-oxygenation with 100% oxygen Induction Type: IV induction and Rapid sequence Laryngoscope Size: Glidescope Tube type: Oral Number of attempts: 1 Airway Equipment and Method: Stylet Placement Confirmation: ETT inserted through vocal cords under direct vision,  positive ETCO2 and breath sounds checked- equal and bilateral Secured at: 21 cm Tube secured with: Tape Dental Injury: Teeth and Oropharynx as per pre-operative assessment

## 2020-02-21 NOTE — Anesthesia Postprocedure Evaluation (Signed)
Anesthesia Post Note  Patient: [name]  Procedure(s) Performed: TOTAL HIP ARTHROPLASTY ANTERIOR APPROACH (Left Hip)     Patient location during evaluation: PACU Anesthesia Type: General Level of consciousness: awake and alert Pain management: pain level controlled Vital Signs Assessment: post-procedure vital signs reviewed and stable Respiratory status: spontaneous breathing, nonlabored ventilation, respiratory function stable and patient connected to nasal cannula oxygen Cardiovascular status: blood pressure returned to baseline and stable Postop Assessment: no apparent nausea or vomiting Anesthetic complications: no   No complications documented.  Last Vitals:  Vitals:   02/21/20 1530 02/21/20 1545  BP: 130/84 134/84  Pulse: 66 (!) 57  Resp: 15 11  Temp: 36.5 C   SpO2: 96% 96%    Last Pain:  Vitals:   02/21/20 1545  TempSrc:   PainSc: Asleep                 [name]

## 2020-02-21 NOTE — Interval H&P Note (Signed)
History and Physical Interval Note:  02/21/2020 9:04 AM  [name]  has presented today for surgery, with the diagnosis of Left hip osteoarthritis.  The various methods of treatment have been discussed with the patient and family. After consideration of risks, benefits and other options for treatment, the patient has consented to  Procedure(s) with comments: TOTAL HIP ARTHROPLASTY ANTERIOR APPROACH (Left) - 70 mins as a surgical intervention.  The patient's history has been reviewed, patient examined, no change in status, stable for surgery.  I have reviewed the patient's chart and labs.  Questions were answered to the patient's satisfaction.     [name]

## 2020-02-21 NOTE — Op Note (Signed)
NAME:  [name]                ACCOUNT NO.: [id number]      MEDICAL RECORD NO.: [id number]      FACILITY:  [facility]      PHYSICIAN:  [name]  DATE OF BIRTH:  [birth year]     DATE OF PROCEDURE:  02/21/2020                                 OPERATIVE REPORT         PREOPERATIVE DIAGNOSIS: Left  hip osteoarthritis.      POSTOPERATIVE DIAGNOSIS:  Left hip osteoarthritis.      PROCEDURE:  Left total hip replacement through an anterior approach   utilizing DePuy THR system, component size 50 mm pinnacle cup, a size 32+4 neutral   Altrex liner, a size 2 Hi Actis stem with a 32+5 delta ceramic   ball.      SURGEON:  Pietro Cassis. Alvan Dame, M.D.      ASSISTANT:  Danae Orleans, PA-C     ANESTHESIA:  General.      SPECIMENS:  None.      COMPLICATIONS:  None.      BLOOD LOSS:  350 cc     DRAINS:  None.      INDICATION OF THE PROCEDURE:  [name] is a 65 y.o. female who had   presented to office for evaluation of left hip pain.  Radiographs revealed   progressive degenerative changes with bone-on-bone   articulation of the  hip joint, including subchondral cystic changes and osteophytes.  The patient had painful limited range of   motion significantly affecting their overall quality of life and function.  The patient was failing to    respond to conservative measures including medications and/or injections and activity modification and at this point was ready   to proceed with more definitive measures.  Consent was obtained for   benefit of pain relief.  Specific risks of infection, DVT, component   failure, dislocation, neurovascular injury, and need for revision surgery were reviewed in the office as well discussion of   the anterior versus posterior approach were reviewed.     PROCEDURE IN DETAIL:  The patient was brought to operative theater.   Once adequate anesthesia, preoperative antibiotics, 2 gm of Ancef, 1 gm of Tranexamic Acid, and 10  mg of Decadron were administered, the patient was positioned supine on the [employer] table.  Once the patient was safely positioned with adequate padding of boney prominences we predraped out the hip, and used fluoroscopy to confirm orientation of the pelvis.      The left hip was then prepped and draped from proximal iliac crest to   mid thigh with a shower curtain technique.      Time-out was performed identifying the patient, planned procedure, and the appropriate extremity.     An incision was then made 2 cm lateral to the   anterior superior iliac spine extending over the orientation of the   tensor fascia lata muscle and sharp dissection was carried down to the   fascia of the muscle.      The fascia was then incised.  The muscle belly was identified and swept   laterally and retractor placed along the superior neck.  Following   cauterization of the circumflex vessels and removing some  pericapsular   fat, a second cobra retractor was placed on the inferior neck.  A T-capsulotomy was made along the line of the   superior neck to the trochanteric fossa, then extended proximally and   distally.  Tag sutures were placed and the retractors were then placed   intracapsular.  We then identified the trochanteric fossa and   orientation of my neck cut and then made a neck osteotomy with the femur on traction.  The femoral   head was removed without difficulty or complication.  Traction was let   off and retractors were placed posterior and anterior around the   acetabulum.      The labrum and foveal tissue were debrided.  I began reaming with a 44 mm   reamer and reamed up to 49 mm reamer with good bony bed preparation and a 50 mm  cup was chosen.  The final 50 mm Pinnacle cup was then impacted under fluoroscopy to confirm the depth of penetration and orientation with respect to   Abduction and forward flexion.  A screw was placed into the ilium followed by the hole eliminator.  The final    32+4 neutral Altrex liner was impacted with good visualized rim fit.  The cup was positioned anatomically within the acetabular portion of the pelvis.      At this point, the femur was rolled to 100 degrees.  Further capsule was   released off the inferior aspect of the femoral neck.  I then   released the superior capsule proximally.  With the leg in a neutral position the hook was placed laterally   along the femur under the vastus lateralis origin and elevated manually and then held in position using the hook attachment on the bed.  The leg was then extended and adducted with the leg rolled to 100   degrees of external rotation.  Retractors were placed along the medial calcar and posteriorly over the greater trochanter.  Once the proximal femur was fully   exposed, I used a box osteotome to set orientation.  I then began   broaching with the starting chili pepper broach and passed this by hand and then broached up to 2.  With the 2 broach in place I chose a high offset neck and did several trial reductions.  The offset was appropriate, leg lengths   appeared to be equal best matched with the +5 head ball trial confirmed radiographically.   Given these findings, I went ahead and dislocated the hip, repositioned all   retractors and positioned the right hip in the extended and abducted position.  The final 2 Hi Actis stem was   chosen and it was impacted down to the level of neck cut.  Based on this   and the trial reductions, a final 32+5 delta ceramic ball was chosen and   impacted onto a clean and dry trunnion, and the hip was reduced.  The   hip had been irrigated throughout the case again at this point.  I did   reapproximate the superior capsular leaflet to the anterior leaflet   using #1 Vicryl.  The fascia of the   tensor fascia lata muscle was then reapproximated using #1 Vicryl and #0 Stratafix sutures.  The   remaining wound was closed with 2-0 Vicryl and running 4-0 Monocryl.   The  hip was cleaned, dried, and dressed sterilely using Dermabond and   Aquacel dressing.  The patient was then brought   to  recovery room in stable condition tolerating the procedure well.    Danae Orleans, PA-C was present for the entirety of the case involved from   preoperative positioning, perioperative retractor management, general   facilitation of the case, as well as primary wound closure as assistant.            Pietro Cassis Alvan Dame, M.D.        02/21/2020 11:51 AM

## 2020-02-21 NOTE — Transfer of Care (Signed)
Immediate Anesthesia Transfer of Care Note  Patient: [name]  Procedure(s) Performed: TOTAL HIP ARTHROPLASTY ANTERIOR APPROACH (Left Hip)  Patient Location: PACU  Anesthesia Type:General  Level of Consciousness: sedated, patient cooperative and responds to stimulation  Airway & Oxygen Therapy: Patient Spontanous Breathing and Patient connected to face mask oxygen  Post-op Assessment: Report given to RN and Post -op Vital signs reviewed and stable  Post vital signs: Reviewed and stable  Last Vitals:  Vitals Value Taken Time  BP 118/74 02/21/20 1334  Temp    Pulse 71 02/21/20 1336  Resp 23 02/21/20 1336  SpO2 56 % 02/21/20 1336  Vitals shown include unvalidated device data.  Last Pain:  Vitals:   02/21/20 0948  TempSrc:   PainSc: 8       Patients Stated Pain Goal: 6 (63/89/37 3428)  Complications: No complications documented.

## 2020-02-22 ENCOUNTER — Encounter (HOSPITAL_COMMUNITY): Payer: Self-pay | Admitting: Orthopedic Surgery

## 2020-02-22 DIAGNOSIS — M1612 Unilateral primary osteoarthritis, left hip: Secondary | ICD-10-CM | POA: Diagnosis not present

## 2020-02-22 LAB — CBC
HCT: 41.3 % (ref 36.0–46.0)
Hemoglobin: 13.6 g/dL (ref 12.0–15.0)
MCH: 33.7 pg (ref 26.0–34.0)
MCHC: 32.9 g/dL (ref 30.0–36.0)
MCV: 102.5 fL — ABNORMAL HIGH (ref 80.0–100.0)
Platelets: 325 10*3/uL (ref 150–400)
RBC: 4.03 MIL/uL (ref 3.87–5.11)
RDW: 12.6 % (ref 11.5–15.5)
WBC: 13 10*3/uL — ABNORMAL HIGH (ref 4.0–10.5)
nRBC: 0 % (ref 0.0–0.2)

## 2020-02-22 LAB — BASIC METABOLIC PANEL
Anion gap: 10 (ref 5–15)
BUN: 11 mg/dL (ref 8–23)
CO2: 25 mmol/L (ref 22–32)
Calcium: 8.9 mg/dL (ref 8.9–10.3)
Chloride: 102 mmol/L (ref 98–111)
Creatinine, Ser: 0.99 mg/dL (ref 0.44–1.00)
GFR, Estimated: >60 mL/min (ref >60)
Glucose, Bld: 133 mg/dL — ABNORMAL HIGH (ref 70–99)
Potassium: 4 mmol/L (ref 3.5–5.1)
Sodium: 137 mmol/L (ref 135–145)

## 2020-02-22 MED ORDER — HYDROCODONE-ACETAMINOPHEN 7.5-325 MG PO TABS: 1.0000 | ORAL_TABLET | ORAL | 0 refills | Status: DC | PRN

## 2020-02-22 MED ORDER — METHOCARBAMOL 500 MG PO TABS: 500.0000 mg | ORAL_TABLET | Freq: Four times a day (QID) | ORAL | 0 refills | Status: DC | PRN

## 2020-02-22 MED ORDER — FERROUS SULFATE 325 (65 FE) MG PO TABS: 325.0000 mg | ORAL_TABLET | Freq: Three times a day (TID) | ORAL | 0 refills | Status: DC

## 2020-02-22 MED ORDER — DOCUSATE SODIUM 100 MG PO CAPS: 100.0000 mg | ORAL_CAPSULE | Freq: Two times a day (BID) | ORAL | 0 refills | Status: DC

## 2020-02-22 MED ORDER — POLYETHYLENE GLYCOL 3350 17 G PO PACK: 17.0000 g | PACK | Freq: Two times a day (BID) | ORAL | 0 refills | Status: DC

## 2020-02-22 NOTE — TOC Transition Note (Signed)
Transition of Care [facility]) - CM/SW Discharge Note   Patient Details  Name: [name] MRN: [id number] Date of Birth: [birth year]  Transition of [facility]) CM/SW Contact:  Lia Hopping, [place] Phone Number: 02/22/2020, 11:41 AM   Clinical Narrative:    Prearranged Therapy Plan: HEP Patient confirm she has a RW and 3 in1 at home from her spouse previous surgery.   Final next level of care: Home/Self Care (HEP) Barriers to Discharge: No Barriers Identified   Patient Goals and CMS Choice        Discharge Placement                       Discharge Plan and Services                                     Social Determinants of Health (SDOH) Interventions     Readmission Risk Interventions No flowsheet data found.

## 2020-02-22 NOTE — Discharge Summary (Signed)
Patient ID: JACQUETTA POLHAMUS MRN: [id number] DOB/AGE: 05/19/1955 65 y.o.  Admit date: 02/21/2020 Discharge date: 02/22/2020  Admission Diagnoses:  Principal Problem:   Osteoarthritis of left hip Active Problems:   Status post left hip replacement   Discharge Diagnoses:  Same  Past Medical History:  Diagnosis Date  . Allergic rhinitis 04/03/2009  . Anemia    hx  . Arthritis   . Asthma, mild, intermittent 01/23/2009   Qualifier: Diagnosis of  By: Jerold Coombe - denies  . Back pain 08/03/2012  . 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 Chronic diastolic CHF (congestive heart failure) ([place]) 07/20/2015  . Depression, recurrent ([place]) 03/03/2007   Qualifier: Diagnosis of  By: Nils Pyle CMA (AAMA), Mearl Latin  -denies  . Difficult intubation    04/09/04 with difficult intubation. unabale to pass ETT with Sabra Heck 2; unable to visualize cord with MAC 3; intubating LMA used; uneventful intubation using Glidescope 09/27/14  . Diverticulosis, with history of diverticulitis of descending and sigmoid colon 03/03/2007  . Dysrhythmia    atrial fib  . Edema of both lower legs   . Essential hypertension 06/25/2006   Qualifier: Diagnosis of  By: Tiney Rouge CMA, Ellison Hughs    . Fatty liver   . Gastritis   . GERD 03/03/2007   Qualifier: Diagnosis of  By: Nils Pyle CMA (AAMA), Mearl Latin   Qualifier: Diagnosis of  By: Shane Crutch, [name]  pt denies at preop of 02/15/20   . Hepatic steatosis, found on CT 03/03/2007      . Hiatal hernia, small 03/28/2016   [facility] 12/09/12.   . Hip pain   . History of colonic polyps 04/21/2008   Last Colonoscopy was 12/09/12, Dr. Fuller Plan. No polyp at that time. Repeat in 5 years.   . Internal hemorrhoids 03/03/2007   Last Colonoscopy was 12/09/12, Dr. Fuller Plan. Repeat in 5 years.   . Joint pain   . Lactose intolerance   . Left adrenal mass ([place]) 05/12/2013  . Menopausal vasomotor syndrome 03/28/2016  . Morbid obesity ([place]) (BMI 35 plus 2 comorbidities) 03/06/2016   Comorbid conditions  include HTN, HLD, hepatic steatosis, OA, GERD, OSA.  . OSA (obstructive sleep apnea) 03/03/2007   cpap   . PAF (paroxysmal atrial fibrillation) ([place]) 04/24/2015  . Palpitations   . PONV (postoperative nausea and vomiting)    area on left forearm DO NOT PLACE IV OR STICK ? vascular"will bleed"  . Umbilical hernia without obstruction or gangrene, fat containing 03/28/2016   Found on CT, 2017. No issues.    Surgeries: Procedure(s): LEFT TOTAL HIP ARTHROPLASTY ANTERIOR APPROACH on 02/21/2020   Consultants: N/A  Discharged Condition: Improved  Hospital Course: LAVERNA DOSSETT is an 65 y.o. female who was admitted 02/21/2020 for operative treatment of Osteoarthritis of left hip. Patient has severe unremitting pain that affects sleep, daily activities, and work/hobbies. After pre-op clearance the patient was taken to the operating room on 02/21/2020 and underwent  Procedure(s): LEFT TOTAL HIP ARTHROPLASTY ANTERIOR APPROACH.    Patient was given perioperative antibiotics:  Anti-infectives (From admission, onward)   Start     Dose/Rate Route Frequency Ordered Stop   02/21/20 1730  ceFAZolin (ANCEF) IVPB 2g/100 mL premix        2 g 200 mL/hr over 30 Minutes Intravenous Every 6 hours 02/21/20 1617 02/21/20 2338   02/21/20 0900  ceFAZolin (ANCEF) IVPB 2g/100 mL premix        2 g 200 mL/hr over 30 Minutes Intravenous On call to  O.R. 02/21/20 0849 02/21/20 1133       Patient was given sequential compression devices, early ambulation, and chemoprophylaxis to prevent DVT.  Patient benefited maximally from hospital stay and there were no complications.    Recent vital signs:  Patient Vitals for the past 24 hrs:  BP Temp Temp src Pulse Resp SpO2 Height Weight  02/22/20 0603 134/73 98 F (36.7 C) Oral 68 17 99 % -- --  02/22/20 0124 138/79 98.5 F (36.9 C) Oral 80 18 99 % -- --  02/21/20 2120 123/78 98.5 F (36.9 C) Oral 62 17 100 % -- --  02/21/20 1857 (!) 143/97 -- -- 65 18 100 % -- --   02/21/20 1708 140/89 -- -- 64 18 98 % -- --  02/21/20 1600 135/73 97.6 F (36.4 C) -- 61 14 97 % _0  (1.651 m) 103 kg  02/21/20 1545 134/84 -- -- (!) 57 11 96 % -- --  02/21/20 1530 130/84 97.7 F (36.5 C) -- 66 15 96 % -- --  02/21/20 1515 (!) 132/98 -- -- 60 14 98 % -- --  02/21/20 1500 134/87 -- -- (!) 58 18 99 % -- --  02/21/20 1445 125/80 -- -- (!) 57 13 (!) 85 % -- --  02/21/20 1430 124/83 -- -- (!) 54 16 (!) 89 % -- --  02/21/20 1334 118/74 97.7 F (36.5 C) -- (!) 56 10 97 % -- --     Recent laboratory studies:  Recent Labs    02/22/20 0335  WBC 13.0*  HGB 13.6  HCT 41.3  PLT 325  NA 137  K 4.0  CL 102  CO2 25  BUN 11  CREATININE 0.99  GLUCOSE 133*  CALCIUM 8.9     Discharge Medications:   Allergies as of 02/22/2020      Reactions   Other Other (See Comments)   ekg pads burns skin needs hypoallergenic   Losartan Other (See Comments)   Chest pain.    Tramadol Other (See Comments)   GI upset, "I felt like I was in a fog"      Medication List    TAKE these medications   amLODipine 5 MG tablet Commonly known as: NORVASC Take 1 tablet (5 mg total) by mouth daily.   apixaban 5 MG Tabs tablet Commonly known as: ELIQUIS Take 1 tablet (5 mg total) by mouth 2 (two) times daily.   Calcium Carbonate-Vitamin D 600-400 MG-UNIT tablet Take 1 tablet by mouth daily.   docusate sodium 100 MG capsule Commonly known as: Colace Take 1 capsule (100 mg total) by mouth 2 (two) times daily.   ELDERBERRY PO Take 2 each by mouth daily.   ferrous sulfate 325 (65 FE) MG tablet Commonly known as: FerrouSul Take 1 tablet (325 mg total) by mouth 3 (three) times daily with meals for 14 days.   HAIR SKIN AND NAILS FORMULA PO Take 1 capsule by mouth in the morning and at bedtime.   HYDROcodone-acetaminophen 7.5-325 MG tablet Commonly known as: Norco Take 1-2 tablets by mouth every 4 (four) hours as needed for moderate pain.   Magnesium 250 MG Tabs Take 250 mg by  mouth daily.   methocarbamol 500 MG tablet Commonly known as: Robaxin Take 1 tablet (500 mg total) by mouth every 6 (six) hours as needed for muscle spasms.   metoprolol succinate 25 MG 24 hr tablet Commonly known as: TOPROL-XL Take 1 tablet (25 mg total) by mouth daily.   multivitamin  with minerals Tabs tablet Take 1 tablet by mouth daily. Alive   polyethylene glycol 17 g packet Commonly known as: MIRALAX / GLYCOLAX Take 17 g by mouth 2 (two) times daily.   propranolol 10 MG tablet Commonly known as: INDERAL Take 1 tablet (10 mg total) by mouth 4 (four) times daily as needed. What changed: reasons to take this   Turmeric 500 MG Caps Take 500 mg by mouth daily.   ULTRAFLORA IMMUNE HEALTH PO Take 1 tablet by mouth daily.            Discharge Care Instructions  (From admission, onward)         Start     Ordered   02/22/20 0000  Change dressing       Comments: Maintain surgical dressing until follow up in the clinic. If the edges start to pull up, may reinforce with tape. If the dressing is no longer working, may remove and cover with gauze and tape, but must keep the area dry and clean.  Call with any questions or concerns.   02/22/20 0936          Diagnostic Studies: DG Pelvis Portable  Result Date: 02/21/2020 CLINICAL DATA:  Left hip replacement EXAM: PORTABLE PELVIS 1-2 VIEWS COMPARISON:  None. FINDINGS: Changes of left hip replacement. Normal AP alignment. No hardware bony complicating feature. IMPRESSION: Left hip replacement.  No visible complicating feature. Electronically Signed   By: Rolm Baptise M.D.   On: 02/21/2020 14:30   DG C-Arm 1-60 Min-No Report  Result Date: 02/21/2020 Fluoroscopy was utilized by the requesting physician.  No radiographic interpretation.   DG HIP OPERATIVE UNILAT W OR W/O PELVIS LEFT  Result Date: 02/21/2020 CLINICAL DATA:  Left hip replacement EXAM: OPERATIVE LEFT HIP (WITH PELVIS IF PERFORMED) 2 VIEWS TECHNIQUE: Fluoroscopic  spot image(s) were submitted for interpretation post-operatively. COMPARISON:  12/22/2013 FINDINGS: Changes of left hip replacement. Normal AP alignment. No hardware bony complicating feature. IMPRESSION: Left hip replacement.  No visible complicating feature. Electronically Signed   By: Rolm Baptise M.D.   On: 02/21/2020 14:31    Disposition: HOME  Discharge Instructions    Call MD / Call 911   Complete by: As directed    If you experience chest pain or shortness of breath, CALL 911 and be transported to the hospital emergency room.  If you develope a fever above 101 F, pus (white drainage) or increased drainage or redness at the wound, or calf pain, call your surgeon's office.   Change dressing   Complete by: As directed    Maintain surgical dressing until follow up in the clinic. If the edges start to pull up, may reinforce with tape. If the dressing is no longer working, may remove and cover with gauze and tape, but must keep the area dry and clean.  Call with any questions or concerns.   Constipation Prevention   Complete by: As directed    Drink plenty of fluids.  Prune juice may be helpful.  You may use a stool softener, such as Colace (over the counter) 100 mg twice a day.  Use MiraLax (over the counter) for constipation as needed.   Diet - low sodium heart healthy   Complete by: As directed    Discharge instructions   Complete by: As directed    Maintain surgical dressing until follow up in the clinic. If the edges start to pull up, may reinforce with tape. If the dressing is no longer working, may  remove and cover with gauze and tape, but must keep the area dry and clean.  Follow up in 2 weeks at [facility]. Call with any questions or concerns.   Increase activity slowly as tolerated   Complete by: As directed    Weight bearing as tolerated with assist device (walker, cane, etc) as directed, use it as long as suggested by your surgeon or therapist, typically at least 4-6 weeks.    TED hose   Complete by: As directed    Use stockings (TED hose) for 2 weeks on both leg(s).  You may remove them at night for sleeping.       Follow-up Information    Paralee Cancel, MD. Schedule an appointment as soon as possible for a visit in 2 weeks.   Specialty: Orthopedic Surgery Contact information: [address] [place] [address] [phone]                Signed: Lucille Passy [facility] 02/22/2020, 9:36 AM

## 2020-02-22 NOTE — Progress Notes (Signed)
Physical Therapy Treatment Patient Details Name: [name] MRN: [id number] DOB: 07-04-1955 Today's Date: 02/22/2020    History of Present Illness Pt is a 65 year old female s/p left direct anterior THA    PT Comments    Pt ambulated in hallway and practiced safe step technique.  Spouse present and observed session.  Pt provided with HEP handout and had no questions.  Pt declined to perform standing exercises as she was eager to d/c however therapist demonstrated standing exercises, and pt and spouse report understanding.  Pt ready to d/c home today.   Follow Up Recommendations  Follow surgeon's recommendation for DC plan and follow-up therapies     Equipment Recommendations  None recommended by PT    Recommendations for Other Services       Precautions / Restrictions Precautions Precautions: Fall Restrictions Weight Bearing Restrictions: No    Mobility  Bed Mobility               General bed mobility comments: pt up in recliner on arrival    Transfers Overall transfer level: Needs assistance Equipment used: Rolling walker (2 wheeled) Transfers: Sit to/from Stand Sit to Stand: Min guard         General transfer comment: verbal cues for UE and LE positioning  Ambulation/Gait Ambulation/Gait assistance: Min guard Gait Distance (Feet): 60 Feet Assistive device: Rolling walker (2 wheeled) Gait Pattern/deviations: Step-to pattern;Decreased stance time - left;Antalgic     General Gait Details: verbal cues for sequence, RW positioning, posture, step length   Stairs Stairs: Yes Stairs assistance: Min guard Stair Management: Forwards;Step to pattern;With walker Number of Stairs: 1 General stair comments: verbal cues for safety, technique, sequence; spouse present and observed   Wheelchair Mobility    Modified Rankin (Stroke Patients Only)       Balance                                            Cognition  Arousal/Alertness: Awake/alert Behavior During Therapy: WFL for tasks assessed/performed Overall Cognitive Status: Within Functional Limits for tasks assessed                                        Exercises      General Comments        Pertinent Vitals/Pain Pain Assessment: 0-10 Pain Score: 5  Pain Location: left hip Pain Descriptors / Indicators: Sore;Tender Pain Intervention(s): Repositioned;Premedicated before session;Monitored during session;Ice applied    Home Living                      Prior Function            PT Goals (current goals can now be found in the care plan section) Progress towards PT goals: Progressing toward goals    Frequency    7X/week      PT Plan Current plan remains appropriate    Co-evaluation              AM-PAC PT "6 Clicks" Mobility   Outcome Measure  Help needed turning from your back to your side while in a flat bed without using bedrails?: A Little Help needed moving from lying on your back to sitting on the side of a flat bed without using  bedrails?: A Little Help needed moving to and from a bed to a chair (including a wheelchair)?: A Little Help needed standing up from a chair using your arms (e.g., wheelchair or bedside chair)?: A Little Help needed to walk in hospital room?: A Little Help needed climbing 3-5 steps with a railing? : A Little 6 Click Score: 18    End of Session Equipment Utilized During Treatment: Gait belt Activity Tolerance: Patient tolerated treatment well Patient left: in chair;with call bell/phone within reach;with family/visitor present   PT Visit Diagnosis: Difficulty in walking, not elsewhere classified (R26.2)     Time: 0052-5910 PT Time Calculation (min) (ACUTE ONLY): 23 min  Charges:  $Gait Training: 8-22 mins                    Arlyce Dice, DPT Acute Rehabilitation Services Pager: [phone] Office: [phone]   York Ram E 02/22/2020, 3:24  PM

## 2020-02-22 NOTE — Progress Notes (Signed)
     Subjective: 1 Day Post-Op Procedure(s) (LRB): TOTAL HIP ARTHROPLASTY ANTERIOR APPROACH (Left)   Patient reports pain as mild, pain controlled. No reported events throughout the night.  Discussed the procedure, findings and expectations moving forward.  Ready to be discharged home, if they do well with PT.  Follow up in the clinic in 2 weeks.  Knows to call with any questions or concerns.       Objective:   VITALS:   Vitals:   02/22/20 0124 02/22/20 0603  BP: 138/79 134/73  Pulse: 80 68  Resp: 18 17  Temp: 98.5 F (36.9 C) 98 F (36.7 C)  SpO2: 99% 99%    Dorsiflexion/Plantar flexion intact Incision: dressing C/D/I No cellulitis present Compartment soft  LABS Recent Labs    02/22/20 0335  HGB 13.6  HCT 41.3  WBC 13.0*  PLT 325    Recent Labs    02/22/20 0335  NA 137  K 4.0  BUN 11  CREATININE 0.99  GLUCOSE 133*     Assessment/Plan: 1 Day Post-Op Procedure(s) (LRB): TOTAL HIP ARTHROPLASTY ANTERIOR APPROACH (Left) Foley cath d/c'ed Advance diet Up with therapy D/C IV fluids Discharge home  Follow up in 2 weeks at [facility] Follow up with OLIN,Nylani Michetti D in 2 weeks.  Contact information:  [facility] [address], [address] [place] [phone]     Obese (BMI 30-39.9) Estimated body mass index is 37.79 kg/m as calculated from the following:   Height as of this encounter: 5\' 5"  (1.651 m).   Weight as of this encounter: 103 kg. Patient also counseled that weight may inhibit the healing process Patient counseled that losing weight will help with future health issues       Danae Orleans PA-C  [facility]  [place] [address], [address], [place], [place] [address] Phone: [phone] [email or web address] Facebook  [employer]

## 2020-02-22 NOTE — Evaluation (Signed)
Physical Therapy Evaluation Patient Details Name: [name] MRN: [id number] DOB: 01-29-1955 Today's Date: 02/22/2020   History of Present Illness  Pt is a 65 year old female s/p left direct anterior THA  Clinical Impression  Pt is s/p THA resulting in the deficits listed below (see PT Problem List).  Pt will benefit from skilled PT to increase their independence and safety with mobility to allow discharge to the venue listed below.  Pt assisted with ambulating in hallway and performing LE exercises.  Answered pt's questions regarding sleeping position and mobilizing at home, also recommended not performing extreme stretching motions or running.  Pt reports she has lost 40 pounds prior to surgery and would like to remain consistent so encouraged walking.  Pt anticipates d/c home with spouse.      Follow Up Recommendations Follow surgeon's recommendation for DC plan and follow-up therapies    Equipment Recommendations  None recommended by PT    Recommendations for Other Services       Precautions / Restrictions Precautions Precautions: Fall Restrictions Weight Bearing Restrictions: No      Mobility  Bed Mobility               General bed mobility comments: pt up in recliner on arrival    Transfers Overall transfer level: Needs assistance Equipment used: Rolling walker (2 wheeled) Transfers: Sit to/from Stand Sit to Stand: Min assist;Min guard         General transfer comment: verbal cues for UE and LE positioning  Ambulation/Gait Ambulation/Gait assistance: Min guard Gait Distance (Feet): 50 Feet Assistive device: Rolling walker (2 wheeled) Gait Pattern/deviations: Step-to pattern;Decreased stance time - left;Antalgic     General Gait Details: verbal cues for sequence, RW positioning, posture, step length  Stairs            Wheelchair Mobility    Modified Rankin (Stroke Patients Only)       Balance                                              Pertinent Vitals/Pain Pain Assessment: 0-10 Pain Score: 5  Pain Location: left hip Pain Descriptors / Indicators: Sore;Tender Pain Intervention(s): Monitored during session;Repositioned    Home Living Family/patient expects to be discharged to:: Private residence Living Arrangements: Spouse/significant other   Type of Home: House Home Access: Stairs to enter   Technical brewer of Steps: 1 Home Layout: Able to live on main level with bedroom/bathroom Home Equipment: Environmental consultant - 2 wheels      Prior Function Level of Independence: Independent               Hand Dominance        Extremity/Trunk Assessment        Lower Extremity Assessment Lower Extremity Assessment: LLE deficits/detail LLE Deficits / Details: anticipated post op hip weakness, grossly 2+/5 hip       Communication   Communication: No difficulties  Cognition Arousal/Alertness: Awake/alert Behavior During Therapy: WFL for tasks assessed/performed Overall Cognitive Status: Within Functional Limits for tasks assessed                                        General Comments      Exercises Total Joint Exercises Ankle Circles/Pumps: AROM;Both;10 reps  Heel Slides: AAROM;Left;10 reps Hip ABduction/ADduction: AAROM;Left;10 reps Long Arc Quad: AROM;Left;Seated;10 reps   Assessment/Plan    PT Assessment Patient needs continued PT services  PT Problem List Decreased strength;Decreased mobility;Decreased knowledge of use of DME;Pain       PT Treatment Interventions Stair training;Gait training;Balance training;DME instruction;Therapeutic exercise;Functional mobility training;Therapeutic activities;Patient/family education    PT Goals (Current goals can be found in the Care Plan section)  Acute Rehab PT Goals PT Goal Formulation: With patient Time For Goal Achievement: 02/25/20 Potential to Achieve Goals: Good    Frequency 7X/week   Barriers to  discharge        Co-evaluation               AM-PAC PT "6 Clicks" Mobility  Outcome Measure Help needed turning from your back to your side while in a flat bed without using bedrails?: A Little Help needed moving from lying on your back to sitting on the side of a flat bed without using bedrails?: A Little Help needed moving to and from a bed to a chair (including a wheelchair)?: A Little Help needed standing up from a chair using your arms (e.g., wheelchair or bedside chair)?: A Little Help needed to walk in hospital room?: A Little Help needed climbing 3-5 steps with a railing? : A Little 6 Click Score: 18    End of Session Equipment Utilized During Treatment: Gait belt Activity Tolerance: Patient tolerated treatment well Patient left: in chair;with call bell/phone within reach   PT Visit Diagnosis: Difficulty in walking, not elsewhere classified (R26.2)    Time: 1583-0940 PT Time Calculation (min) (ACUTE ONLY): 30 min   Charges:   PT Evaluation $PT Eval Low Complexity: 1 Low PT Treatments $Therapeutic Exercise: 8-22 mins   Jannette Spanner PT, DPT Acute Rehabilitation Services Pager: [phone] Office: [phone]  York Ram E 02/22/2020, 12:21 PM

## 2020-04-09 ENCOUNTER — Other Ambulatory Visit: Payer: Self-pay

## 2020-04-09 MED ORDER — METOPROLOL SUCCINATE ER 25 MG PO TB24: 25.0000 mg | ORAL_TABLET | Freq: Every day | ORAL | 2 refills | Status: DC

## 2020-04-09 MED ORDER — AMLODIPINE BESYLATE 5 MG PO TABS: 5.0000 mg | ORAL_TABLET | Freq: Every day | ORAL | 2 refills | Status: DC

## 2020-05-10 ENCOUNTER — Other Ambulatory Visit: Payer: Self-pay | Admitting: Obstetrics and Gynecology

## 2020-05-10 DIAGNOSIS — N644 Mastodynia: Secondary | ICD-10-CM

## 2020-05-31 ENCOUNTER — Encounter (INDEPENDENT_AMBULATORY_CARE_PROVIDER_SITE_OTHER): Payer: Self-pay | Admitting: Bariatrics

## 2020-06-14 ENCOUNTER — Ambulatory Visit
Admission: RE | Admit: 2020-06-14 | Discharge: 2020-06-14 | Disposition: A | Discharge status: Home / Self Care | Source: Ambulatory Visit | Attending: Obstetrics and Gynecology | Admitting: Obstetrics and Gynecology

## 2020-06-14 ENCOUNTER — Other Ambulatory Visit: Payer: Self-pay

## 2020-06-14 DIAGNOSIS — N644 Mastodynia: Secondary | ICD-10-CM

## 2020-06-18 ENCOUNTER — Ambulatory Visit (INDEPENDENT_AMBULATORY_CARE_PROVIDER_SITE_OTHER): Admitting: Bariatrics

## 2020-06-18 ENCOUNTER — Encounter (INDEPENDENT_AMBULATORY_CARE_PROVIDER_SITE_OTHER): Payer: Self-pay | Admitting: Bariatrics

## 2020-06-18 ENCOUNTER — Other Ambulatory Visit: Payer: Self-pay

## 2020-06-18 VITALS — BP 140/83 | HR 68 | Temp 97.8°F | Ht 65.0 in | Wt 220.0 lb

## 2020-06-18 DIAGNOSIS — R632 Polyphagia: Secondary | ICD-10-CM | POA: Diagnosis not present

## 2020-06-18 DIAGNOSIS — E66812 Obesity, class 2: Secondary | ICD-10-CM

## 2020-06-18 DIAGNOSIS — Z9189 Other specified personal risk factors, not elsewhere classified: Secondary | ICD-10-CM | POA: Diagnosis not present

## 2020-06-18 DIAGNOSIS — I10 Essential (primary) hypertension: Secondary | ICD-10-CM | POA: Diagnosis not present

## 2020-06-18 DIAGNOSIS — R7303 Prediabetes: Secondary | ICD-10-CM

## 2020-06-18 DIAGNOSIS — Z6838 Body mass index (BMI) 38.0-38.9, adult: Secondary | ICD-10-CM

## 2020-06-18 MED ORDER — OZEMPIC (0.25 OR 0.5 MG/DOSE) 2 MG/1.5ML ~~LOC~~ SOPN: 0.2500 mg | PEN_INJECTOR | SUBCUTANEOUS | 0 refills | Status: DC

## 2020-06-21 NOTE — Progress Notes (Signed)
Chief Complaint:   OBESITY [name] is here to discuss her progress with her obesity treatment plan along with follow-up of her obesity related diagnoses. [name] is on the Category 2 Plan and states she is following her eating plan approximately 0% of the time. [name] states she is not currently exercising.  Today's visit was #: 3 Starting weight: 231 lbs Starting date: 01/25/2020 Today's weight: 220 lb Today's date: 06/18/2020 Total lbs lost to date: 11 lbs Total lbs lost since last in-office visit: 5 lbs  Interim History: [name] last appointment with me was on 2/2/222.  She has had a hip replacement in February of this year. She states the recovery period has been difficult.  She is down 5 pounds.   Subjective:   1. Prediabetes [name] has a diagnosis of prediabetes based on her elevated HgA1c and was informed this puts her at greater risk of developing diabetes. She continues to work on diet and exercise to decrease her risk of diabetes. She denies nausea or hypoglycemia. [name] is not taking any medications.  Lab Results  Component Value Date   HGBA1C 5.6 07/29/2016   Lab Results  Component Value Date   INSULIN 9.2 01/25/2020     2. Essential hypertension Reasonably well controlled.  BP Readings from Last 3 Encounters:  06/18/20 140/83  02/22/20 139/82  02/15/20 137/64   Lab Results  Component Value Date   CREATININE 0.99 02/22/2020   CREATININE 0.80 02/15/2020   CREATININE 1.01 (H) 10/01/2018   3. Polyphagia  [name] endorses excessive hunger, mostly nights  4. At risk for nausea Due to taking Ozempic   Assessment/Plan:   1. Prediabetes [name] will continue to work on weight loss, exercise, and decreasing simple carbohydrates to help decrease the risk of diabetes.  [name] will start Ozempic 0.25 mg inject into skin once weekly.  2. Essential hypertension [name] is working on healthy weight loss and exercise to improve blood pressure control. We will watch for  signs of hypotension as she continues her lifestyle modifications. [name] will continue her medication.  3. Polyphagia [name] is not taking any medications  4. Risk nausea [name] was given approximately 15 minutes of nausea prevention counseling today. [name] is at risk for nausea due to her new or current medication. She was encouraged to titrate her medication slowly, make sure to stay hydrated, eat smaller portions throughout the day, and avoid high fat meals.    5. Obesity, current BMI 36 [name] is currently in the action stage of change. As such, her goal is to continue with weight loss efforts. She has agreed to the Category 2 Plan.   Meal planning Mindful eating Continue to increase water intake  Exercise goals: PT from hip surgery is finished  Behavioral modification strategies: increasing lean protein intake, decreasing simple carbohydrates, increasing vegetables, increasing water intake, decreasing eating out, no skipping meals, meal planning and cooking strategies, keeping healthy foods in the home, and planning for success.  [name] has agreed to follow-up with our clinic in 2 weeks. She was informed of the importance of frequent follow-up visits to maximize her success with intensive lifestyle modifications for her multiple health conditions.   Objective:   Blood pressure 140/83, pulse 68, temperature 97.8 F (36.6 C), height 5\' 5"  (1.651 m), weight 220 lb (99.8 kg), SpO2 98 %. Body mass index is 36.61 kg/m.  General: Cooperative, alert, well developed, in no acute distress. HEENT: Conjunctivae and lids unremarkable. Cardiovascular: Regular rhythm.  Lungs:  Normal work of breathing. Neurologic: No focal deficits.   Lab Results  Component Value Date   CREATININE 0.99 02/22/2020   BUN 11 02/22/2020   NA 137 02/22/2020   K 4.0 02/22/2020   CL 102 02/22/2020   CO2 25 02/22/2020   Lab Results  Component Value Date   ALT 24 10/01/2018   AST 25 10/01/2018    ALKPHOS 70 10/01/2018   BILITOT 0.5 10/01/2018   Lab Results  Component Value Date   HGBA1C 5.6 07/29/2016   HGBA1C 5.8 04/22/2016   HGBA1C 5.8 (H) 01/28/2012   HGBA1C 5.8 11/14/2009   HGBA1C 5.6 08/21/2009   Lab Results  Component Value Date   INSULIN 9.2 01/25/2020   Lab Results  Component Value Date   TSH 1.670 01/25/2020   Lab Results  Component Value Date   CHOL 139 10/01/2018   HDL 42 10/01/2018   LDLCALC 75 10/01/2018   TRIG 124 10/01/2018   CHOLHDL 3.3 10/01/2018   Lab Results  Component Value Date   WBC 13.0 (H) 02/22/2020   HGB 13.6 02/22/2020   HCT 41.3 02/22/2020   MCV 102.5 (H) 02/22/2020   PLT 325 02/22/2020   Lab Results  Component Value Date   IRON 76 12/13/2007   Attestation Statements:   Reviewed by clinician on day of visit: allergies, medications, problem list, medical history, surgical history, family history, social history, and previous encounter notes.  Time spent on visit including pre-visit chart review and post-visit care and charting was 20 minutes.   ILennette Bihari, CMA, am acting as Location manager for [employer].  I have reviewed the above documentation for accuracy and completeness, and I agree with the above. Jearld Lesch, DO

## 2020-06-28 ENCOUNTER — Encounter (INDEPENDENT_AMBULATORY_CARE_PROVIDER_SITE_OTHER): Payer: Self-pay | Admitting: Bariatrics

## 2020-07-12 ENCOUNTER — Encounter (INDEPENDENT_AMBULATORY_CARE_PROVIDER_SITE_OTHER): Payer: Self-pay

## 2020-07-16 ENCOUNTER — Ambulatory Visit (INDEPENDENT_AMBULATORY_CARE_PROVIDER_SITE_OTHER): Admitting: Bariatrics

## 2020-07-23 DIAGNOSIS — M25552 Pain in left hip: Secondary | ICD-10-CM | POA: Insufficient documentation

## 2020-07-29 ENCOUNTER — Encounter: Payer: Self-pay | Admitting: Cardiovascular Disease

## 2020-07-29 NOTE — Progress Notes (Signed)
Cardiology Office Note   Date:  07/30/2020   ID:  [name], [name] [birth year], MRN [id number]  PCP:  London Pepper, MD  Cardiologist:   Mertie Moores, MD   Chief Complaint  Patient presents with   Congestive Heart Failure   Problem List 1. Atrial fib   - This patients CHA2DS2-VASc Score and unadjusted Ischemic Stroke Rate (% per year) is equal to 2.2 % stroke rate/year from a score of 2  Above score calculated as 1 point each if present [CHF, HTN, DM, Vascular=MI/PAD/Aortic Plaque, Age if 65-74, or Female] Above score calculated as 2 points each if present [Age > 75, or Stroke/TIA/TE]  2. Essential HTN 3. Grade 2 diastolic dysfunction  - normal LV function 4. Obstructive sleep apnea -   [name] is a 65 y.o. female who presents for atrial fib, She was seen in the ER with PAF Has had a dull head ache.   Echo shows normal LV systolic function Myoview shows no ischemia   July 20, 2015:  [name] is seen today for follow up visit for her paroxysmal atrial fib.  Has had 1 episode of palpitations - likely was PAF - related to stress.  Is watching the salt in her diet .  The Xarelto seems to be upsetting her stomach  Held it for several days and her stomach started bothering her again  March 06, 2016:  Doing well  Having some back pain  Had an episode of atrial fib 3 nights ago.   Work her up . Has OAS - uses CPAP but she had pulled it off. She put the CPAP mask back on   09/15/2016: [name] is seen back for follow-up evaluation. Wt is 228 ( down 9 lbs from last visit )  Has been having more palpitations.  Took some extra metoprolol which seems    Has not been using her CPAP because her mask has lots of condensation and drains back into there mask .  Has been tried on Lisinopril ( caused CP and cough) and was on Valsartan ( was recalled )  Doing well on the [facility]   March 18, 2017:   [name] is seen back today for follow-up of her chronic diastolic  congestive heart failure, paroxysmal atrial fibrillation Has had difficult losing weight Has night sweats - possbily due to menapause  BP is a little elevated.   Under lots of stress  - sister is in the hospital ( heavy smoker)  No dyspnea. Still has occasional palpitations - took an extra propranolol which helps   June 17, 2017:  [name] is seen back for an early work in visit Was seen by Dr. Martinique a month ago.  She was having episodes of chest pain and an elevated blood pressure. She had been seen in the emergency room complaining of chest pain and left arm pain.  Troponin levels were normal. Dr. Doug Sou opinion was that the chest pain was related to her elevated blood pressure.  She has had a normal Myoview study 2 years ago.  The plan is to repeat the Myoview study if she has recurrent symptoms.  BP is still  Moderately elevated  She has cut back on her salt.    Has seen a nutritionist.      August 03, 2017: [name] is seen today for follow-up of her hypertension and chronic diastolic congestive heart failure. BP readings have been elevated.  Has been to the [facility]   she  had some Hibiscus Tea yesterday  -  BP is normal today   Sept. 5, 2019: [name] is seen today for follow-up of her hypertension and chronic diastolic congestive heart failure.  She was recently seen in the emergency room for paroxysmal atrial fibrillation.  She has a history of obstructive sleep apnea but has not been wearing her CPAP mask.  Episode of A flutter  - woke her up at 5 am.   Sent to the ER  Aflutter resolved with IV cardiazem   February 01, 2018: [name] was seen in the emergency room in December, 2019.   Th[facility] to have [facility]itis[facility]  . Pressure is been well controlled. Received lots of meds , steroids. Is just now getting better .  Is wearing her CPAP  BP is well controlled.  Has gained some  weight Wt today is 231 lbs    ( weight in Sept. 2019 was 225)   Is on a [facility] .   Sept.  28, 2020   [name] is seen today for follow up of her PAF and HTN  Wt.239 lbs ( up 8 lbs since last year )  Was going to a trainer  But has gained wt with covid .  No dyspnea.  No cp,     Sept. 13, 2021 [name] is seen for follow up of her PAF, HTN Wt = 220 ( down 19 lbs)    - was 239 last year Has developed an injury of her left hip   Has had her covid vaccines ( 2)   Her left hip pain is causing her to not sleep well at night . Has had more episodes of Atib.  Uses her CPAP    Jan. 25, 2022 [name] is seen for pre op visit prior to hip replacement  Wt is 231 ( up 11 lbs )   Has occasional palpitations   July 30, 2020: [name] is seen for follow up of her PAF, HTN, chronic diastolic CHF, obesity  Had hip replacement several months ago Wt is 228  lbs ( down 3 lbs from Jan )   Is working on weight loss ,   eats popcorn every night - we discussed this in great detail.   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 unabale to pass ETT with Sabra Heck 2; unable to visualize cord with MAC 3; intubating LMA used; uneventful intubation using Glidescope 09/27/14   Diverticulosis, with history of diverticulitis of descending and sigmoid colon 03/03/2007   Dysrhythmia    atrial fib   Edema of both lower legs    Essential hypertension 06/25/2006   Qualifier: Diagnosis  of  By: Tiney Rouge CMA, Brent.Pancake     Fatty liver    Gastritis    GERD 03/03/2007   Qualifier: Diagnosis of  By: Nils Pyle CMA Deborra Medina), Mearl Latin   Qualifier:  Diagnosis of  By: Shane Crutch, [name]  pt denies at preop of 02/15/20    Hepatic steatosis, found on CT 03/03/2007       Hiatal hernia, small 03/28/2016   EGD by [facility] 12/09/12.    Hip pain    History of colonic polyps 04/21/2008   Last Colonoscopy was 12/09/12, Dr. Fuller Plan. No polyp at that time. Repeat in 5 years.    Internal hemorrhoids 03/03/2007   Last Colonoscopy was 12/09/12, Dr. Fuller Plan. Repeat in 5 years.    Joint pain    Lactose intolerance    Left adrenal mass ([place]) 05/12/2013   Menopausal vasomotor syndrome 03/28/2016   Morbid obesity ([place]) (BMI 35 plus 2 comorbidities) 03/06/2016   Comorbid conditions include HTN, HLD, hepatic steatosis, OA, GERD, OSA.   OSA (obstructive sleep apnea) 03/03/2007   cpap    PAF (paroxysmal atrial fibrillation) ([place]) 04/24/2015   Palpitations    PONV (postoperative nausea and vomiting)    area on left forearm DO NOT PLACE IV OR STICK ? vascular"will bleed"   Umbilical hernia without obstruction or gangrene, fat containing 03/28/2016   Found on CT, 2017. No issues.    Past Surgical History:  Procedure Laterality Date   ABDOMINAL HYSTERECTOMY     APPENDECTOMY     BREAST REDUCTION SURGERY     BUNIONECTOMY     bilateral   CERVICAL FUSION     CHOLECYSTECTOMY N/A 10/01/2016   Procedure: LAPAROSCOPIC CHOLECYSTECTOMY;  Surgeon: Coralie Keens, MD;  Location: [facility];  Service: General;  Laterality: N/A;   COLONOSCOPY WITH PROPOFOL N/A 05/12/2019   Procedure: COLONOSCOPY WITH PROPOFOL;  Surgeon: Juanita Craver, MD;  Location: [facility];  Service: Endoscopy;  Laterality: N/A;   MAXIMUM ACCESS (MAS)POSTERIOR LUMBAR INTERBODY FUSION (PLIF) 2 LEVEL N/A 09/27/2014   Procedure: Maximum Access Surgery Posterior Lumbar Interbody Fusion Lumbar three-four, Lumbar four-five;  Surgeon: Eustace Moore, MD;  Location: [facility];   Service: Neurosurgery;  Laterality: N/A;   POLYPECTOMY  05/12/2019   Procedure: POLYPECTOMY;  Surgeon: Juanita Craver, MD;  Location: [facility];  Service: Endoscopy;;   SPINE SURGERY     TOTAL ABDOMINAL HYSTERECTOMY W/ BILATERAL SALPINGOOPHORECTOMY     TOTAL HIP ARTHROPLASTY Left 02/21/2020   Procedure: TOTAL HIP ARTHROPLASTY ANTERIOR APPROACH;  Surgeon: Paralee Cancel, MD;  Location: [facility];  Service: Orthopedics;  Laterality: Left;  70 mins     Current Outpatient Medications  Medication Sig Dispense Refill   amLODipine (NORVASC) 5 MG tablet Take 1 tablet (5 mg total) by mouth daily. 90 tablet 2   apixaban (ELIQUIS) 5 MG TABS tablet Take 1 tablet (5 mg total) by mouth 2 (two) times daily. 180 tablet 1   Calcium Carbonate-Vitamin D 600-400 MG-UNIT tablet Take 1 tablet by mouth daily.     ELDERBERRY PO Take 2 each by mouth daily.     metoprolol succinate (TOPROL-XL) 25 MG 24 hr tablet Take 1 tablet (25 mg total) by mouth daily. 90 tablet 2   Multiple Vitamins-Minerals (HAIR SKIN AND NAILS FORMULA PO) Take 1 capsule by mouth in the morning and at bedtime.     Probiotic Product (ULTRAFLORA IMMUNE HEALTH PO) Take 1 tablet by mouth daily.     propranolol (INDERAL) 10 MG tablet Take 1 tablet (10 mg total) by mouth 4 (four) times daily as needed. 360 tablet 3   ferrous sulfate (FERROUSUL) 325 (65 FE) MG tablet Take 1 tablet (325 mg total) by mouth 3 (three) times daily with  meals for 14 days. 42 tablet 0   methocarbamol (ROBAXIN) 500 MG tablet Take 1 tablet (500 mg total) by mouth every 6 (six) hours as needed for muscle spasms. (Patient not taking: Reported on 07/30/2020) 40 tablet 0   Multiple Vitamin (MULTIVITAMIN WITH MINERALS) TABS tablet Take 1 tablet by mouth daily. Alive (Patient not taking: Reported on 07/30/2020)     Semaglutide,0.25 or 0.5MG/DOS, (OZEMPIC, 0.25 OR 0.5 MG/DOSE,) 2 MG/1.5ML SOPN Inject 0.25 mg into the skin once a week. (Patient not taking: Reported on 07/30/2020) 1.5 mL 0   No  current facility-administered medications for this visit.    Allergies:   Other, Losartan, and Tramadol    Social History:  The patient  reports that she has never smoked. She has never used smokeless tobacco. She reports that she does not drink alcohol and does not use drugs.   Family History:  The patient's family history includes Coronary artery disease in her sister; Diabetes in her mother; Heart disease in her mother; Hypertension in her mother and sister; Sleep apnea in her mother.    ROS:  Please see the history of present illness.    Physical Exam: Blood pressure (!) 146/84, pulse 65, height _0  (1.651 m), weight 228 lb 6.4 oz (103.6 kg), SpO2 98 %.  GEN:  Well nourished, well developed in no acute distress HEENT: Normal NECK: No JVD; No carotid bruits LYMPHATICS: No lymphadenopathy CARDIAC: RRR , no murmurs, rubs, gallops RESPIRATORY:  Clear to auscultation without rales, wheezing or rhonchi  ABDOMEN: Soft, non-tender, non-distended MUSCULOSKELETAL:  No edema; No deformity  SKIN: Warm and dry NEUROLOGIC:  Alert and oriented x 3   EKG:     Recent Labs: 01/25/2020: TSH 1.670 02/22/2020: BUN 11; Creatinine, Ser 0.99; Hemoglobin 13.6; Platelets 325; Potassium 4.0; Sodium 137    Lipid Panel    Component Value Date/Time   CHOL 139 10/01/2018 0900   TRIG 124 10/01/2018 0900   HDL 42 10/01/2018 0900   CHOLHDL 3.3 10/01/2018 0900   CHOLHDL 3 04/22/2016 1234   VLDL 20.6 04/22/2016 1234   LDLCALC 75 10/01/2018 0900      Wt Readings from Last 3 Encounters:  07/30/20 228 lb 6.4 oz (103.6 kg)  06/18/20 220 lb (99.8 kg)  02/21/20 227 lb 1.2 oz (103 kg)      Other studies Reviewed: Additional studies/ records that were reviewed today include: . Review of the above records demonstrates:    ASSESSMENT AND PLAN:  1.  Paroxysmal atrial fibrillation:     .no recurrent afib recently   2.   Essential hypertension:    still eating popcorn - every night   Advised  her to stop eating popcorn nightly    3.    Chronic diastolic congestive heart failure:    advised weight loss,     4.  Chest pain:    5.  Obesity:    she is dong well.   Needs to continue to work on diet and exercise more       Current medicines are reviewed at length with the patient today.  The patient does not have concerns regarding medicines.  Labs/ tests ordered today include:   No orders of the defined types were placed in this encounter.   Disposition:   FU with me in  1 year      Mertie Moores, MD  07/30/2020 11:27 AM    [place], [place]  [address] Phone: [phone]; Fax: [phone]

## 2020-07-30 ENCOUNTER — Other Ambulatory Visit: Payer: Self-pay

## 2020-07-30 ENCOUNTER — Ambulatory Visit (INDEPENDENT_AMBULATORY_CARE_PROVIDER_SITE_OTHER): Admitting: Cardiovascular Disease

## 2020-07-30 ENCOUNTER — Encounter: Payer: Self-pay | Admitting: Cardiovascular Disease

## 2020-07-30 VITALS — BP 146/84 | HR 65 | Ht 65.0 in | Wt 228.4 lb

## 2020-07-30 DIAGNOSIS — I480 Paroxysmal atrial fibrillation: Secondary | ICD-10-CM

## 2020-07-30 DIAGNOSIS — I10 Essential (primary) hypertension: Secondary | ICD-10-CM

## 2020-07-30 DIAGNOSIS — I5032 Chronic diastolic (congestive) heart failure: Secondary | ICD-10-CM | POA: Diagnosis not present

## 2020-07-30 NOTE — Patient Instructions (Signed)
Medication Instructions:  Your physician recommends that you continue on your current medications as directed. Please refer to the Current Medication list given to you today.  *If you need a refill on your cardiac medications before your next appointment, please call your pharmacy*   Follow-Up: At [facility], you and your health needs are our priority.  As part of our continuing mission to provide you with exceptional heart care, we have created designated Provider Care Teams.  These Care Teams include your primary Cardiologist (physician) and Advanced Practice Providers (APPs -  Physician Assistants and Nurse Practitioners) who all work together to provide you with the care you need, when you need it.  Your next appointment:   1 year(s)  The format for your next appointment:   In Person  Provider:   You may see Mertie Moores, MD or one of the following Advanced Practice Providers on your designated Care Team:   Richardson Dopp, PA-C [place], [place]

## 2020-08-13 ENCOUNTER — Other Ambulatory Visit: Payer: Self-pay

## 2020-08-13 MED ORDER — APIXABAN 5 MG PO TABS: 5.0000 mg | ORAL_TABLET | Freq: Two times a day (BID) | ORAL | 1 refills | Status: DC

## 2020-08-13 NOTE — Telephone Encounter (Signed)
Eliquis mg refill request received. Patient is 65 years old, weight- 103.6 kg, Crea- 0.99, and last seen by PN on 08/02/23/22. Dose is appropriate based on dosing criteria. Will send in refill to requested pharmacy.

## 2020-09-07 ENCOUNTER — Ambulatory Visit (INDEPENDENT_AMBULATORY_CARE_PROVIDER_SITE_OTHER): Admitting: Cardiovascular Disease

## 2020-09-07 ENCOUNTER — Encounter: Payer: Self-pay | Admitting: Cardiovascular Disease

## 2020-09-07 ENCOUNTER — Telehealth: Payer: Self-pay | Admitting: Cardiovascular Disease

## 2020-09-07 ENCOUNTER — Other Ambulatory Visit: Payer: Self-pay

## 2020-09-07 VITALS — BP 136/80 | HR 62 | Ht 65.0 in | Wt 228.2 lb

## 2020-09-07 DIAGNOSIS — I5032 Chronic diastolic (congestive) heart failure: Secondary | ICD-10-CM | POA: Diagnosis not present

## 2020-09-07 DIAGNOSIS — I10 Essential (primary) hypertension: Secondary | ICD-10-CM | POA: Diagnosis not present

## 2020-09-07 DIAGNOSIS — I480 Paroxysmal atrial fibrillation: Secondary | ICD-10-CM

## 2020-09-07 MED ORDER — METOPROLOL SUCCINATE ER 25 MG PO TB24: 25.0000 mg | ORAL_TABLET | Freq: Two times a day (BID) | ORAL | 3 refills | Status: DC

## 2020-09-07 NOTE — Telephone Encounter (Signed)
Patient c/o Palpitations:  High priority if patient c/o lightheadedness, shortness of breath, or chest pain  How long have you had palpitations/irregular HR/ Afib? Are you having the symptoms now? The last 2 days, no calmed down after metoprolol  Are you currently experiencing lightheadedness, SOB or CP? Tired, SOB comes and goes when moving around, stomach   Do you have a history of afib (atrial fibrillation) or irregular heart rhythm? Yes, afib  Have you checked your BP or HR? (document readings if available): no  Are you experiencing any other symptoms? Tired, stomach feels like maybe indigestion     Patient states for the last 2 days she has had a fluttering heart. He says she took her metoprolol and it has calmed down. She says she has also been tired and has SOB when she moves around. Patient states she is SOB now and does sound out of breathe while speaking on the phone. She says she is laying down, but is elevated, because when she lays flat it is worse. She says her stomach also feels like it has indigestion.

## 2020-09-07 NOTE — Telephone Encounter (Signed)
Spoke with pt re message and pt notes fluttering and SOB  since Wednesday afternoon these symptoms come and go Pt has been taking Metoprolol 25 mg bid and also has Propranolol as directed  with no relief Discussed with Dr Acie Fredrickson and appt for this afternoon has been made  at 1:30 pm  with Dr Acie Fredrickson  Pt aware. Pt does not have any means to check B/P or HR as B/P machine is not working

## 2020-09-07 NOTE — Progress Notes (Signed)
Cardiology Office Note   Date:  09/07/2020   ID:  [name], [name] 1955-02-08, MRN [id number]  PCP:  London Pepper, MD  Cardiologist:   Mertie Moores, MD   Chief Complaint  Patient presents with   Atrial Fibrillation   Congestive Heart Failure   Problem List 1. Atrial fib   - This patients CHA2DS2-VASc Score and unadjusted Ischemic Stroke Rate (% per year) is equal to 2.2 % stroke rate/year from a score of 2  Above score calculated as 1 point each if present [CHF, HTN, DM, Vascular=MI/PAD/Aortic Plaque, Age if 65-74, or Female] Above score calculated as 2 points each if present [Age > 75, or Stroke/TIA/TE]  2. Essential HTN 3. Grade 2 diastolic dysfunction  - normal LV function 4. Obstructive sleep apnea -   [name] is a 65 y.o. female who presents for atrial fib, She was seen in the ER with PAF Has had a dull head ache.   Echo shows normal LV systolic function Myoview shows no ischemia   July 20, 2015:  [name] is seen today for follow up visit for her paroxysmal atrial fib.  Has had 1 episode of palpitations - likely was PAF - related to stress.  Is watching the salt in her diet .  The Xarelto seems to be upsetting her stomach  Held it for several days and her stomach started bothering her again  March 06, 2016:  Doing well  Having some back pain  Had an episode of atrial fib 3 nights ago.   Work her up . Has OAS - uses CPAP but she had pulled it off. She put the CPAP mask back on   09/15/2016: [name] is seen back for follow-up evaluation. Wt is 228 ( down 9 lbs from last visit )  Has been having more palpitations.  Took some extra metoprolol which seems    Has not been using her CPAP because her mask has lots of condensation and drains back into there mask .  Has been tried on Lisinopril ( caused CP and cough) and was on Valsartan ( was recalled )  Doing well on the [facility]   March 18, 2017:   [name] is seen back today for follow-up of her  chronic diastolic congestive heart failure, paroxysmal atrial fibrillation Has had difficult losing weight Has night sweats - possbily due to menapause  BP is a little elevated.   Under lots of stress  - sister is in the hospital ( heavy smoker)  No dyspnea. Still has occasional palpitations - took an extra propranolol which helps   June 17, 2017:  [name] is seen back for an early work in visit Was seen by Dr. Martinique a month ago.  She was having episodes of chest pain and an elevated blood pressure. She had been seen in the emergency room complaining of chest pain and left arm pain.  Troponin levels were normal. Dr. Doug Sou opinion was that the chest pain was related to her elevated blood pressure.  She has had a normal Myoview study 2 years ago.  The plan is to repeat the Myoview study if she has recurrent symptoms.  BP is still  Moderately elevated  She has cut back on her salt.    Has seen a nutritionist.      August 03, 2017: [name] is seen today for follow-up of her hypertension and chronic diastolic congestive heart failure. BP readings have been elevated.  Has been to the [facility]  hospital   she had some Hibiscus Tea yesterday  -  BP is normal today   Sept. 5, 2019: [name] is seen today for follow-up of her hypertension and chronic diastolic congestive heart failure.  She was recently seen in the emergency room for paroxysmal atrial fibrillation.  She has a history of obstructive sleep apnea but has not been wearing her CPAP mask.  Episode of A flutter  - woke her up at 5 am.   Sent to the ER  Aflutter resolved with IV cardiazem   February 01, 2018: [name] was seen in the emergency room in December, 2019.   Thought to have bronchitisWas [facility]  . Pressure is been well controlled. Received lots of meds , steroids. Is just now getting better .  Is wearing her CPAP  BP is well controlled.  Has  gained some weight Wt today is 231 lbs    ( weight in Sept. 2019 was 225)   Is [facility] a [facility] .   Sept.  28, 2020   [name] is seen today for follow up of her PAF and HTN  Wt.239 lbs ( up 8 lbs since last year )  Was going to a trainer  But has gained wt with covid .  No dyspnea.  No cp,     Sept. 13, 2021 [name] is seen for follow up of her PAF, HTN Wt = 220 ( down 19 lbs)    - was 239 last year Has developed an injury of her left hip   Has had her covid vaccines ( 2)   Her left hip pain is causing her to not sleep well at night . Has had more episodes of Atib.  Uses her CPAP    Jan. 25, 2022 [name] is seen for pre op visit prior to hip replacement  Wt is 231 ( up 11 lbs )   Has occasional palpitations   July 30, 2020: [name] is seen for follow up of her PAF, HTN, chronic diastolic CHF, obesity  Had hip replacement several months ago Wt is 228  lbs ( down 3 lbs from Jan )   Is working on weight loss ,   eats popcorn every night - we discussed this in great detail.   Has had some palpitations  Drinks    Sept. 2, 2022: [name] is seen as a work in visit for increased palpitations She thinks she is back  in atrial fib  ECG today shows NSR  Palps would last a few seconds.  Would resolve,  then start back up Has been under stress. Not sleeping well Taking care of her older sister ( in a memory center )  We discussed setting boundaries with regard to visiting and caring for her sister.   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 unabale to pass ETT with Sabra Heck 2; unable to visualize cord with  MAC 3; intubating LMA used; uneventful intubation using Glidescope 09/27/14   Diverticulosis, with history of diverticulitis of descending and sigmoid colon 03/03/2007   Dysrhythmia    atrial fib   Edema of both lower legs    Essential hypertension 06/25/2006   Qualifier: Diagnosis of  By: Tiney Rouge CMA, Ellison Hughs     Fatty liver    Gastritis    GERD 03/03/2007   Qualifier: Diagnosis of  By: Nils Pyle CMA (AAMA), Mearl Latin   Qualifier: Diagnosis of  By: Shane Crutch, [name]  pt denies at preop of 02/15/20    Hepatic steatosis, found on CT 03/03/2007       Hiatal hernia, small 03/28/2016   EGD by [facility] 12/09/12.    Hip pain    History of colonic polyps 04/21/2008   Last Colonoscopy was 12/09/12, Dr. Fuller Plan. No polyp at that time. Repeat in 5 years.    Internal hemorrhoids 03/03/2007   Last Colonoscopy was 12/09/12, Dr. Fuller Plan. Repeat in 5 years.    Joint pain    Lactose intolerance    Left adrenal mass ([place]) 05/12/2013   Menopausal vasomotor syndrome 03/28/2016   Morbid obesity ([place]) (BMI 35 plus 2 comorbidities) 03/06/2016   Comorbid conditions include HTN, HLD, hepatic steatosis, OA, GERD, OSA.   OSA (obstructive sleep apnea) 03/03/2007   cpap    PAF (paroxysmal atrial fibrillation) ([place]) 04/24/2015   Palpitations    PONV (postoperative nausea and vomiting)    area on left forearm DO NOT PLACE IV OR STICK ? vascular"will bleed"   Umbilical hernia without obstruction or gangrene, fat containing 03/28/2016   Found on CT, 2017. No issues.    Past Surgical History:  Procedure Laterality Date   ABDOMINAL HYSTERECTOMY     APPENDECTOMY     BREAST REDUCTION SURGERY     BUNIONECTOMY     bilateral   CERVICAL FUSION     CHOLECYSTECTOMY N/A 10/01/2016   Procedure: LAPAROSCOPIC CHOLECYSTECTOMY;  Surgeon: Coralie Keens, MD;  Location: [facility];  Service: General;  Laterality: N/A;   COLONOSCOPY  WITH PROPOFOL N/A 05/12/2019   Procedure: COLONOSCOPY WITH PROPOFOL;  Surgeon: Juanita Craver, MD;  Location: [facility];  Service: Endoscopy;  Laterality: N/A;   MAXIMUM ACCESS (MAS)POSTERIOR LUMBAR INTERBODY FUSION (PLIF) 2 LEVEL N/A 09/27/2014   Procedure: Maximum Access Surgery Posterior Lumbar Interbody Fusion Lumbar three-four, Lumbar four-five;  Surgeon: Eustace Moore, MD;  Location: [facility];  Service: Neurosurgery;  Laterality: N/A;   POLYPECTOMY  05/12/2019   Procedure: POLYPECTOMY;  Surgeon: Juanita Craver, MD;  Location: [facility];  Service: Endoscopy;;   SPINE SURGERY     TOTAL ABDOMINAL HYSTERECTOMY W/ BILATERAL SALPINGOOPHORECTOMY     TOTAL HIP ARTHROPLASTY Left 02/21/2020   Procedure: TOTAL HIP ARTHROPLASTY ANTERIOR APPROACH;  Surgeon: Paralee Cancel, MD;  Location: [facility];  Service: Orthopedics;  Laterality: Left;  70 mins     Current Outpatient Medications  Medication Sig Dispense Refill   amLODipine (NORVASC) 5 MG tablet Take 1 tablet (5 mg total) by mouth daily. 90 tablet 2   apixaban (ELIQUIS) 5 MG TABS tablet Take 1 tablet (5 mg total) by mouth 2 (two) times daily. 180 tablet 1   ELDERBERRY PO Take 2 each by mouth daily.     metoprolol succinate (TOPROL-XL) 25 MG 24 hr tablet Take 1 tablet (25 mg total) by mouth daily. 90 tablet 2   Multiple Vitamins-Minerals (HAIR SKIN AND NAILS FORMULA PO) Take 1 capsule by mouth in the morning and  at bedtime.     Probiotic Product (ULTRAFLORA IMMUNE HEALTH PO) Take 1 tablet by mouth daily.     propranolol (INDERAL) 10 MG tablet Take 1 tablet (10 mg total) by mouth 4 (four) times daily as needed. 360 tablet 3   Calcium Carbonate-Vitamin D 600-400 MG-UNIT tablet Take 1 tablet by mouth daily. (Patient not taking: Reported on 09/07/2020)     ferrous sulfate (FERROUSUL) 325 (65 FE) MG tablet Take 1 tablet (325 mg total) by mouth 3 (three) times daily with meals for 14 days. 42 tablet 0   hydrocortisone 2.5 % ointment Apply topically 2 (two)  times daily. (Patient not taking: Reported on 09/07/2020)     methocarbamol (ROBAXIN) 500 MG tablet Take 1 tablet (500 mg total) by mouth every 6 (six) hours as needed for muscle spasms. (Patient not taking: Reported on 09/07/2020) 40 tablet 0   Multiple Vitamin (MULTIVITAMIN WITH MINERALS) TABS tablet Take 1 tablet by mouth daily. Alive (Patient not taking: Reported on 09/07/2020)     Semaglutide,0.25 or 0.5MG/DOS, (OZEMPIC, 0.25 OR 0.5 MG/DOSE,) 2 MG/1.5ML SOPN Inject 0.25 mg into the skin once a week. (Patient not taking: Reported on 09/07/2020) 1.5 mL 0   No current facility-administered medications for this visit.    Allergies:   Other, Losartan, and Tramadol    Social History:  The patient  reports that she has never smoked. She has never used smokeless tobacco. She reports that she does not drink alcohol and does not use drugs.   Family History:  The patient's family history includes Coronary artery disease in her sister; Diabetes in her mother; Heart disease in her mother; Hypertension in her mother and sister; Sleep apnea in her mother.    ROS:  Please see the history of present illness.    Physical Exam: Blood pressure 136/80, pulse 62, height _0  (1.651 m), weight 228 lb 3.2 oz (103.5 kg).  GEN:  middle age female,  moderately obese  HEENT: Normal NECK: No JVD; No carotid bruits LYMPHATICS: No lymphadenopathy CARDIAC: RRR , no murmurs, rubs, gallops RESPIRATORY:  Clear to auscultation without rales, wheezing or rhonchi  ABDOMEN: Soft, non-tender, non-distended MUSCULOSKELETAL:  No edema; No deformity  SKIN: Warm and dry NEUROLOGIC:  Alert and oriented x 3   EKG: September 07, 2020: Normal sinus rhythm with a heart rate of 62.  Nonspecific ST and T wave changes. No changes from previous EKG.   Recent Labs: 01/25/2020: TSH 1.670 02/22/2020: BUN 11; Creatinine, Ser 0.99; Hemoglobin 13.6; Platelets 325; Potassium 4.0; Sodium 137    Lipid Panel    Component Value Date/Time    CHOL 139 10/01/2018 0900   TRIG 124 10/01/2018 0900   HDL 42 10/01/2018 0900   CHOLHDL 3.3 10/01/2018 0900   CHOLHDL 3 04/22/2016 1234   VLDL 20.6 04/22/2016 1234   LDLCALC 75 10/01/2018 0900      Wt Readings from Last 3 Encounters:  09/07/20 228 lb 3.2 oz (103.5 kg)  07/30/20 228 lb 6.4 oz (103.6 kg)  06/18/20 220 lb (99.8 kg)      Other studies Reviewed: Additional studies/ records that were reviewed today include: . Review of the above records demonstrates:    ASSESSMENT AND PLAN:  1.  Paroxysmal atrial fibrillation:    she having more palpitations for the past several days.    [name] presents today for further evaluation of some palpitations.  She thought she might be having recurrent atrial fibrillation.  Her palpitations only last for a  second or so.  She is in normal sinus rhythm today by EKG.  I suspect that she might be having some premature ventricular contractions.  She is not sleeping well.  She has been spending a lot of time caring for her sister who has dementia and is on memory care.   I have advised her that she needs to get more rest..  She will need to start setting some boundaries in regards to her visitation to her sister's memory care unit.    2.   Essential hypertension:      Blood pressure is fairly well controlled.     3.    Chronic diastolic congestive heart failure:      Ive  advised weight loss.  4.  Chest pain:    5.  Obesity:        Current medicines are reviewed at length with the patient today.  The patient does not have concerns regarding medicines.  Labs/ tests ordered today include:   No orders of the defined types were placed in this encounter.   Disposition:   FU with me in  1 year      Mertie Moores, MD  09/07/2020 1:45 PM    [facility] [address], [place], [place]  [address] Phone: [phone]; Fax: [phone]

## 2020-09-07 NOTE — Patient Instructions (Signed)
Medication Instructions:  Your physician has recommended you make the following change in your medication:  1) INCREASE Toprol XL (metoprolol succinate) to 25 mg twice daily   *If you need a refill on your cardiac medications before your next appointment, please call your pharmacy*  Lab Work: TODAY: FLP, ALT, BMET If you have labs (blood work) drawn today and your tests are completely normal, you will receive your results only by: [place] (if you have MyChart) OR A paper copy in the mail If you have any lab test that is abnormal or we need to change your treatment, we will call you to review the results.  Follow-Up: At [facility], you and your health needs are our priority.  As part of our continuing mission to provide you with exceptional heart care, we have created designated Provider Care Teams.  These Care Teams include your primary Cardiologist (physician) and Advanced Practice Providers (APPs -  Physician Assistants and Nurse Practitioners) who all work together to provide you with the care you need, when you need it.   Your next appointment:   July 2023  The format for your next appointment:   In Person  Provider:   You may see Mertie Moores, MD or one of the following Advanced Practice Providers on your designated Care Team:   Richardson Dopp, PA-C [employer], [place]

## 2020-09-08 LAB — LIPID PANEL
Chol/HDL Ratio: 2.9 ratio (ref 0.0–4.4)
Cholesterol, Total: 137 mg/dL (ref 100–199)
HDL: 47 mg/dL (ref >39)
LDL Chol Calc (NIH): 69 mg/dL (ref 0–99)
Triglycerides: 118 mg/dL (ref 0–149)
VLDL Cholesterol Cal: 21 mg/dL (ref 5–40)

## 2020-09-08 LAB — BASIC METABOLIC PANEL
BUN/Creatinine Ratio: 10 — ABNORMAL LOW (ref 12–28)
BUN: 9 mg/dL (ref 8–27)
CO2: 24 mmol/L (ref 20–29)
Calcium: 9.7 mg/dL (ref 8.7–10.3)
Chloride: 102 mmol/L (ref 96–106)
Creatinine, Ser: 0.9 mg/dL (ref 0.57–1.00)
Glucose: 85 mg/dL (ref 65–99)
Potassium: 4.7 mmol/L (ref 3.5–5.2)
Sodium: 141 mmol/L (ref 134–144)
eGFR: 72 mL/min/{1.73_m2} (ref >59)

## 2020-09-08 LAB — ALT: ALT: 21 IU/L (ref 0–32)

## 2020-09-21 ENCOUNTER — Other Ambulatory Visit: Payer: Self-pay

## 2020-09-21 ENCOUNTER — Encounter (HOSPITAL_BASED_OUTPATIENT_CLINIC_OR_DEPARTMENT_OTHER): Payer: Self-pay | Admitting: *Deleted

## 2020-09-21 ENCOUNTER — Emergency Department (HOSPITAL_BASED_OUTPATIENT_CLINIC_OR_DEPARTMENT_OTHER)
Admission: EM | Admit: 2020-09-21 | Discharge: 2020-09-21 | Disposition: A | Discharge status: Home / Self Care | Attending: Emergency Medicine | Admitting: Emergency Medicine

## 2020-09-21 DIAGNOSIS — R109 Unspecified abdominal pain: Secondary | ICD-10-CM | POA: Diagnosis not present

## 2020-09-21 DIAGNOSIS — Z5321 Procedure and treatment not carried out due to patient leaving prior to being seen by health care provider: Secondary | ICD-10-CM | POA: Insufficient documentation

## 2020-09-21 NOTE — ED Triage Notes (Signed)
Left quad abdominal pain x 4 days.  Hx of diverticulitis.

## 2020-10-01 ENCOUNTER — Other Ambulatory Visit: Payer: Self-pay | Admitting: Family Medicine

## 2020-10-01 DIAGNOSIS — K5792 Diverticulitis of intestine, part unspecified, without perforation or abscess without bleeding: Secondary | ICD-10-CM

## 2020-10-19 ENCOUNTER — Ambulatory Visit
Admission: RE | Admit: 2020-10-19 | Discharge: 2020-10-19 | Disposition: A | Discharge status: Home / Self Care | Source: Ambulatory Visit | Attending: Family Medicine | Admitting: Family Medicine

## 2020-10-19 DIAGNOSIS — K5792 Diverticulitis of intestine, part unspecified, without perforation or abscess without bleeding: Secondary | ICD-10-CM

## 2020-10-19 MED ORDER — IOPAMIDOL (ISOVUE-300) INJECTION 61%
100.0000 mL | Freq: Once | INTRAVENOUS | Status: AC | PRN
  Administered 2020-10-19: 100 mL via INTRAVENOUS

## 2020-10-23 ENCOUNTER — Other Ambulatory Visit: Payer: Self-pay

## 2020-10-23 ENCOUNTER — Ambulatory Visit (INDEPENDENT_AMBULATORY_CARE_PROVIDER_SITE_OTHER): Admitting: Bariatrics

## 2020-10-23 ENCOUNTER — Encounter (INDEPENDENT_AMBULATORY_CARE_PROVIDER_SITE_OTHER): Payer: Self-pay | Admitting: Bariatrics

## 2020-10-23 VITALS — BP 140/74 | HR 63 | Temp 98.1°F | Ht 65.0 in | Wt 228.0 lb

## 2020-10-23 DIAGNOSIS — I10 Essential (primary) hypertension: Secondary | ICD-10-CM | POA: Diagnosis not present

## 2020-10-23 DIAGNOSIS — Z6837 Body mass index (BMI) 37.0-37.9, adult: Secondary | ICD-10-CM

## 2020-10-23 DIAGNOSIS — R7303 Prediabetes: Secondary | ICD-10-CM | POA: Diagnosis not present

## 2020-10-23 DIAGNOSIS — Z6838 Body mass index (BMI) 38.0-38.9, adult: Secondary | ICD-10-CM

## 2020-10-23 DIAGNOSIS — K760 Fatty (change of) liver, not elsewhere classified: Secondary | ICD-10-CM | POA: Diagnosis not present

## 2020-10-23 MED ORDER — OZEMPIC (0.25 OR 0.5 MG/DOSE) 2 MG/1.5ML ~~LOC~~ SOPN: 0.2500 mg | PEN_INJECTOR | SUBCUTANEOUS | 0 refills | Status: DC

## 2020-10-23 NOTE — Progress Notes (Signed)
Chief Complaint:   OBESITY [name] is here to discuss her progress with her obesity treatment plan along with follow-up of her obesity related diagnoses. [name] is on the Category 2 Plan and the [place] and states she is following her eating plan approximately 0% of the time. [name] states she is walking for 20 minutes 1-2 times per week.  Today's visit was #: 4 Starting weight: 231 lbs Starting date: 01/25/2020 Today's weight: 228 lbs Today's date: 10/23/2020 Total lbs lost to date: 3 lbs Total lbs lost since last in-office visit: 0  Interim History: [name] is up 8 lbs and had left hip replacement and needs to get back on track. I last saw this patient on 06/18/2020.  Subjective:   1. Essential hypertension [name] is taking Norvasc and Toprol-XL,  2. Prediabetes [name] is not taking medications currently for pre-diabetes.  3. NAFLD (nonalcoholic fatty liver disease) [name] has a new dx of CT.  She denies abdominal pain or jaundice and has never been told of any liver problems in the past. She denies excessive alcohol intake.  Assessment/Plan:   1. Essential hypertension [name] will continue medications. She is working on healthy weight loss and exercise to improve blood pressure control. We will watch for signs of hypotension as she continues her lifestyle modifications.  2. Prediabetes [name] will continue to work on weight loss, exercise, and decreasing simple carbohydrates to help decrease the risk of diabetes and refrain sweets. We will refill Semaglutide 0.25 mg for 1 month with no refills.   - Semaglutide,0.25 or 0.5MG /DOS, (OZEMPIC, 0.25 OR 0.5 MG/DOSE,) 2 MG/1.5ML SOPN; Inject 0.25 mg into the skin once a week.  Dispense: 1.5 mL; Refill: 0  3. NAFLD (nonalcoholic fatty liver disease) [name] will decrease diet and exercise. She will decrease carbohydrates We discussed the likely diagnosis of non-alcoholic fatty liver disease today and how this condition is obesity  related. [name] was educated the importance of weight loss. [name] agreed to continue with her weight loss efforts with healthier diet and exercise as an essential part of her treatment plan.   4. Obesity, current BMI 38.0 [name] is currently in the action stage of change. As such, her goal is to continue with weight loss efforts. She has agreed to the Category 2 Plan and the [place]. [name] will continue meal planning and intentional eating. She will increase water intake.  Exercise goals:  As is.  Behavioral modification strategies: increasing lean protein intake, decreasing simple carbohydrates, increasing vegetables, increasing water intake, decreasing eating out, no skipping meals, meal planning and cooking strategies, keeping healthy foods in the home, and planning for success.  [name] has agreed to follow-up with our clinic in 2-3 weeks (fasting IC). She was informed of the importance of frequent follow-up visits to maximize her success with intensive lifestyle modifications for her multiple health conditions.   Objective:   Blood pressure 140/74, pulse 63, temperature 98.1 F (36.7 C), height 5\' 5"  (1.651 m), weight 228 lb (103.4 kg), SpO2 98 %. Body mass index is 37.94 kg/m.  General: Cooperative, alert, well developed, in no acute distress. HEENT: Conjunctivae and lids unremarkable. Cardiovascular: Regular rhythm.  Lungs: Normal work of breathing. Neurologic: No focal deficits.   Lab Results  Component Value Date   CREATININE 0.90 09/07/2020   BUN 9 09/07/2020   NA 141 09/07/2020   K 4.7 09/07/2020   CL 102 09/07/2020   CO2 24 09/07/2020   Lab Results  Component Value Date  ALT 21 09/07/2020   AST 25 10/01/2018   ALKPHOS 70 10/01/2018   BILITOT 0.5 10/01/2018   Lab Results  Component Value Date   HGBA1C 5.6 07/29/2016   HGBA1C 5.8 04/22/2016   HGBA1C 5.8 (H) 01/28/2012   HGBA1C 5.8 11/14/2009   HGBA1C 5.6 08/21/2009   Lab Results  Component Value Date    INSULIN 9.2 01/25/2020   Lab Results  Component Value Date   TSH 1.670 01/25/2020   Lab Results  Component Value Date   CHOL 137 09/07/2020   HDL 47 09/07/2020   LDLCALC 69 09/07/2020   TRIG 118 09/07/2020   CHOLHDL 2.9 09/07/2020   Lab Results  Component Value Date   VD25OH 70.2 01/25/2020   VD25OH 62 02/07/2013   Lab Results  Component Value Date   WBC 13.0 (H) 02/22/2020   HGB 13.6 02/22/2020   HCT 41.3 02/22/2020   MCV 102.5 (H) 02/22/2020   PLT 325 02/22/2020   Lab Results  Component Value Date   IRON 76 12/13/2007   Attestation Statements:   Reviewed by clinician on day of visit: allergies, medications, problem list, medical history, surgical history, family history, social history, and previous encounter notes.  I, Lizbeth Bark, RMA, am acting as Location manager for [employer], DO.   I have reviewed the above documentation for accuracy and completeness, and I agree with the above. Jearld Lesch, DO

## 2020-10-24 ENCOUNTER — Encounter (INDEPENDENT_AMBULATORY_CARE_PROVIDER_SITE_OTHER): Payer: Self-pay | Admitting: Bariatrics

## 2020-10-24 DIAGNOSIS — E6609 Other obesity due to excess calories: Secondary | ICD-10-CM | POA: Insufficient documentation

## 2020-11-20 ENCOUNTER — Encounter (INDEPENDENT_AMBULATORY_CARE_PROVIDER_SITE_OTHER): Payer: Self-pay | Admitting: Bariatrics

## 2020-11-20 ENCOUNTER — Ambulatory Visit (INDEPENDENT_AMBULATORY_CARE_PROVIDER_SITE_OTHER): Admitting: Bariatrics

## 2020-11-20 ENCOUNTER — Other Ambulatory Visit: Payer: Self-pay

## 2020-11-20 VITALS — BP 146/84 | HR 64 | Temp 98.0°F | Ht 65.0 in | Wt 225.0 lb

## 2020-11-20 DIAGNOSIS — K5909 Other constipation: Secondary | ICD-10-CM | POA: Diagnosis not present

## 2020-11-20 DIAGNOSIS — Z6838 Body mass index (BMI) 38.0-38.9, adult: Secondary | ICD-10-CM

## 2020-11-20 DIAGNOSIS — R7303 Prediabetes: Secondary | ICD-10-CM | POA: Diagnosis not present

## 2020-11-20 DIAGNOSIS — K760 Fatty (change of) liver, not elsewhere classified: Secondary | ICD-10-CM

## 2020-11-20 NOTE — Progress Notes (Signed)
Chief Complaint:   OBESITY [name] is here to discuss her progress with her obesity treatment plan along with follow-up of her obesity related diagnoses. [name] is on the Category 2 Plan and states she is following her eating plan approximately 75% of the time. [name] states she is walking for 30 minutes 3 times per week.  Today's visit was #: 5 Starting weight: 231 lbs Starting date: 01/25/2020 Today's weight: 225 lbs Today's date: 11/20/2020 Total lbs lost to date: 6 lbs Total lbs lost since last in-office visit: 3 lbs  Interim History: [name] is down 3 lbs since her last visit.  Subjective:   1. Pre-diabetes [name] was prescribed Ozempic but stopped due to constipation.   2. NAFLD (nonalcoholic fatty liver disease) [name] is at risk for impaired function of liver due to likely diagnosis of fatty liver as evidenced by recent elevated liver enzymes.    3. Other constipation [name] will increase Ozempic. She will use laxatives.   Assessment/Plan:   1. Pre-diabetes [name] will resume Ozempic. She will continue to work on weight loss, exercise, and decreasing simple carbohydrates to help decrease the risk of diabetes.   2. NAFLD (nonalcoholic fatty liver disease) [name] will monitor with her primary care provider. She will continue to work on weight loss. We discussed the likely diagnosis of non-alcoholic fatty liver disease today and how this condition is obesity related. [name] was educated the importance of weight loss. [name] agreed to continue with her weight loss efforts with healthier diet and exercise as an essential part of her treatment plan.   3. Other constipation [name] was informed that a decrease in bowel movement frequency is normal while losing weight, but stools should not be hard or painful. She will try Miralax or Citrucel daily with water, coffee or tea. Orders and follow up as documented in patient record.   Counseling Getting to Good Bowel Health: Your goal is to  have one soft bowel movement each day. Drink at least 8 glasses of water each day. Eat plenty of fiber (goal is over 25 grams each day). It is best to get most of your fiber from dietary sources which includes leafy green vegetables, fresh fruit, and whole grains. You may need to add fiber with the help of OTC fiber supplements. These include Metamucil, Citrucel, and Flaxseed. If you are still having trouble, try adding Miralax or Magnesium Citrate. If all of these changes do not work, Cabin crew.   4. Obesity, current BMI 37.6 [name] is currently in the action stage of change. As such, her goal is to continue with weight loss efforts. She has agreed to the Category 2 Plan.   [name] will continue meal planning and she will continue intentional eating. Strategies for the holiday were provided.  Exercise goals:  As is.  Behavioral modification strategies: increasing lean protein intake, decreasing simple carbohydrates, increasing vegetables, increasing water intake, decreasing eating out, no skipping meals, meal planning and cooking strategies, keeping healthy foods in the home, and planning for success.  [name] has agreed to follow-up with our clinic in 2 weeks (fasting labs). She was informed of the importance of frequent follow-up visits to maximize her success with intensive lifestyle modifications for her multiple health conditions.   Objective:   Blood pressure (!) 146/84, pulse 64, temperature 98 F (36.7 C), height 5\' 5"  (1.651 m), weight 225 lb (102.1 kg), SpO2 98 %. Body mass index is 37.44 kg/m.  General: Cooperative, alert, well developed, in  no acute distress. HEENT: Conjunctivae and lids unremarkable. Cardiovascular: Regular rhythm.  Lungs: Normal work of breathing. Neurologic: No focal deficits.   Lab Results  Component Value Date   CREATININE 0.90 09/07/2020   BUN 9 09/07/2020   NA 141 09/07/2020   K 4.7 09/07/2020   CL 102 09/07/2020   CO2 24 09/07/2020    Lab Results  Component Value Date   ALT 21 09/07/2020   AST 25 10/01/2018   ALKPHOS 70 10/01/2018   BILITOT 0.5 10/01/2018   Lab Results  Component Value Date   HGBA1C 5.6 07/29/2016   HGBA1C 5.8 04/22/2016   HGBA1C 5.8 (H) 01/28/2012   HGBA1C 5.8 11/14/2009   HGBA1C 5.6 08/21/2009   Lab Results  Component Value Date   INSULIN 9.2 01/25/2020   Lab Results  Component Value Date   TSH 1.670 01/25/2020   Lab Results  Component Value Date   CHOL 137 09/07/2020   HDL 47 09/07/2020   LDLCALC 69 09/07/2020   TRIG 118 09/07/2020   CHOLHDL 2.9 09/07/2020   Lab Results  Component Value Date   VD25OH 70.2 01/25/2020   VD25OH 62 02/07/2013   Lab Results  Component Value Date   WBC 13.0 (H) 02/22/2020   HGB 13.6 02/22/2020   HCT 41.3 02/22/2020   MCV 102.5 (H) 02/22/2020   PLT 325 02/22/2020   Lab Results  Component Value Date   IRON 76 12/13/2007   Attestation Statements:   Reviewed by clinician on day of visit: allergies, medications, problem list, medical history, surgical history, family history, social history, and previous encounter notes.  I, Lizbeth Bark, RMA, am acting as Location manager for [employer], DO.   I have reviewed the above documentation for accuracy and completeness, and I agree with the above. Jearld Lesch, DO

## 2020-12-06 ENCOUNTER — Ambulatory Visit (INDEPENDENT_AMBULATORY_CARE_PROVIDER_SITE_OTHER): Admitting: Family Medicine

## 2020-12-18 ENCOUNTER — Ambulatory Visit (INDEPENDENT_AMBULATORY_CARE_PROVIDER_SITE_OTHER): Admitting: Family Medicine

## 2020-12-18 ENCOUNTER — Encounter (INDEPENDENT_AMBULATORY_CARE_PROVIDER_SITE_OTHER): Payer: Self-pay | Admitting: Family Medicine

## 2020-12-18 ENCOUNTER — Other Ambulatory Visit: Payer: Self-pay

## 2020-12-18 VITALS — BP 147/77 | HR 63 | Temp 97.9°F | Ht 65.0 in | Wt 225.0 lb

## 2020-12-18 DIAGNOSIS — R7303 Prediabetes: Secondary | ICD-10-CM | POA: Diagnosis not present

## 2020-12-18 DIAGNOSIS — Z6838 Body mass index (BMI) 38.0-38.9, adult: Secondary | ICD-10-CM

## 2020-12-18 DIAGNOSIS — I10 Essential (primary) hypertension: Secondary | ICD-10-CM

## 2020-12-18 MED ORDER — METFORMIN HCL 500 MG PO TABS
500.0000 mg | ORAL_TABLET | Freq: Every day | ORAL | 0 refills | Status: DC
Start: 2020-12-18 — End: 2021-01-10

## 2020-12-19 NOTE — Progress Notes (Signed)
Chief Complaint:   OBESITY [name] is here to discuss her progress with her obesity treatment plan along with follow-up of her obesity related diagnoses. [name] is on the Category 2 Plan and states she is following her eating plan approximately 40% of the time. [name] states she is walking for 30 minutes 7 times per week.  Today's visit was #: 6 Starting weight: 231 lbs Starting date: 01/25/2020 Today's weight: 225 lbs Today's date: 12/18/2020 Total lbs lost to date: 6 Total lbs lost since last in-office visit: 0  Interim History: [name] has done well with maintaining her weight. She is struggling with hunger at times, and she is frustrated that she doesn't eat much but she is still not losing weight.  Subjective:   1. Pre-diabetes [name] was put on Ozempic, but she had significant constipation so she stopped.  2. Essential hypertension [name]'s blood pressure is elevated today. She feels it is likely traffic related. No chest pain or dizziness was noted.  Assessment/Plan:   1. Pre-diabetes [name] agreed to discontinue Ozempic, and start metformin 500 mg q AM with food, with no refills. She will continue to work on weight loss, exercise, and decreasing simple carbohydrates to help decrease the risk of diabetes.   - metFORMIN (GLUCOPHAGE) 500 MG tablet; Take 1 tablet (500 mg total) by mouth daily with breakfast.  Dispense: 30 tablet; Refill: 0  2. Essential hypertension [name] will continue with diet and Norvasc, and we will recheck her blood pressure in 3 weeks.  3. Obesity, current BMI 37.5 [name] is currently in the action stage of change. As such, her goal is to continue with weight loss efforts. She has agreed to the Category 2 Plan.   [name] was educated on the disease MOA and the importance of eating all of the food on her plan to help keep her RMR from falling.  Exercise goals: As is.  Behavioral modification strategies: increasing lean protein intake, decreasing simple  carbohydrates, and no skipping meals.  [name] has agreed to follow-up with our clinic in 3 weeks. She was informed of the importance of frequent follow-up visits to maximize her success with intensive lifestyle modifications for her multiple health conditions.   Objective:   Blood pressure (!) 147/77, pulse 63, temperature 97.9 F (36.6 C), height 5\' 5"  (1.651 m), weight 225 lb (102.1 kg), SpO2 99 %. Body mass index is 37.44 kg/m.  General: Cooperative, alert, well developed, in no acute distress. HEENT: Conjunctivae and lids unremarkable. Cardiovascular: Regular rhythm.  Lungs: Normal work of breathing. Neurologic: No focal deficits.   Lab Results  Component Value Date   CREATININE 0.90 09/07/2020   BUN 9 09/07/2020   NA 141 09/07/2020   K 4.7 09/07/2020   CL 102 09/07/2020   CO2 24 09/07/2020   Lab Results  Component Value Date   ALT 21 09/07/2020   AST 25 10/01/2018   ALKPHOS 70 10/01/2018   BILITOT 0.5 10/01/2018   Lab Results  Component Value Date   HGBA1C 5.6 07/29/2016   HGBA1C 5.8 04/22/2016   HGBA1C 5.8 (H) 01/28/2012   HGBA1C 5.8 11/14/2009   HGBA1C 5.6 08/21/2009   Lab Results  Component Value Date   INSULIN 9.2 01/25/2020   Lab Results  Component Value Date   TSH 1.670 01/25/2020   Lab Results  Component Value Date   CHOL 137 09/07/2020   HDL 47 09/07/2020   LDLCALC 69 09/07/2020   TRIG 118 09/07/2020   CHOLHDL 2.9 09/07/2020  Lab Results  Component Value Date   VD25OH 70.2 01/25/2020   VD25OH 62 02/07/2013   Lab Results  Component Value Date   WBC 13.0 (H) 02/22/2020   HGB 13.6 02/22/2020   HCT 41.3 02/22/2020   MCV 102.5 (H) 02/22/2020   PLT 325 02/22/2020   Lab Results  Component Value Date   IRON 76 12/13/2007   Attestation Statements:   Reviewed by clinician on day of visit: allergies, medications, problem list, medical history, surgical history, family history, social history, and previous encounter notes.   I, Trixie Dredge, am acting as transcriptionist for Dennard Nip, MD.  I have reviewed the above documentation for accuracy and completeness, and I agree with the above. -  Dennard Nip, MD

## 2020-12-26 ENCOUNTER — Other Ambulatory Visit: Payer: Self-pay

## 2020-12-26 ENCOUNTER — Emergency Department (INDEPENDENT_AMBULATORY_CARE_PROVIDER_SITE_OTHER)
Admission: EM | Admit: 2020-12-26 | Discharge: 2020-12-26 | Disposition: A | Discharge status: Home / Self Care | Source: Home / Self Care

## 2020-12-26 DIAGNOSIS — R059 Cough, unspecified: Secondary | ICD-10-CM

## 2020-12-26 DIAGNOSIS — J0100 Acute maxillary sinusitis, unspecified: Secondary | ICD-10-CM

## 2020-12-26 DIAGNOSIS — J309 Allergic rhinitis, unspecified: Secondary | ICD-10-CM

## 2020-12-26 MED ORDER — BENZONATATE 200 MG PO CAPS: 200.0000 mg | ORAL_CAPSULE | Freq: Three times a day (TID) | ORAL | 0 refills | Status: AC | PRN

## 2020-12-26 MED ORDER — CEFDINIR 300 MG PO CAPS: 300.0000 mg | ORAL_CAPSULE | Freq: Two times a day (BID) | ORAL | 0 refills | Status: AC

## 2020-12-26 MED ORDER — FEXOFENADINE HCL 180 MG PO TABS: 180.0000 mg | ORAL_TABLET | Freq: Every day | ORAL | 0 refills | Status: DC

## 2020-12-26 NOTE — Discharge Instructions (Addendum)
Advised patient to take medication as directed with food to completion.  Advised patient to take Allegra with first dose of Cefdinir for the next 5 of 7 days.  Advised may use Allegra as needed afterwards for concurrent postnasal drainage/drip.  Advised may use Tessalon Perles daily or as needed for cough.

## 2020-12-26 NOTE — ED Provider Notes (Signed)
[name]    CSN: [id number] Arrival date & time: 12/26/20  1358      History   Chief Complaint Chief Complaint  Patient presents with   Cough   Nasal Congestion    HPI [name] is a 65 y.o. female.   HPI 65 year old female presents with cough and nasal congestion, and loss of taste and smell for 1 week.  Patient reports negative home COVID-19 test.  PMH significant for mild intermittent asthma, chronic diastolic heart failure, and paroxysmal atrial fibrillation.  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 unabale to pass ETT with Sabra Heck 2; unable to visualize cord with MAC 3; intubating LMA used; uneventful intubation using Glidescope 09/27/14   Diverticulosis, with history of diverticulitis of descending and sigmoid colon 03/03/2007   Dysrhythmia    atrial fib   Edema of both lower legs    Essential hypertension 06/25/2006   Qualifier: Diagnosis of  By: Tiney Rouge CMA, Ellison Hughs     Fatty liver    Gastritis    GERD 03/03/2007   Qualifier: Diagnosis of  By: Nils Pyle CMA (AAMA), Mearl Latin   Qualifier: Diagnosis of  By: Shane Crutch, [name]  pt denies at preop of 02/15/20    Hepatic steatosis, found on CT 03/03/2007       Hiatal hernia, small 03/28/2016   EGD by [facility] 12/09/12.    Hip pain    History of colonic polyps 04/21/2008   Last Colonoscopy was 12/09/12, Dr. Fuller Plan. No polyp at that time. Repeat in 5 years.    Internal hemorrhoids 03/03/2007   Last Colonoscopy was 12/09/12, Dr. Fuller Plan. Repeat in 5 years.     Joint pain    Lactose intolerance    Left adrenal mass ([place]) 05/12/2013   Menopausal vasomotor syndrome 03/28/2016   Morbid obesity ([place]) (BMI 35 plus 2 comorbidities) 03/06/2016   Comorbid conditions include HTN, HLD, hepatic steatosis, OA, GERD, OSA.   OSA (obstructive sleep apnea) 03/03/2007   cpap    PAF (paroxysmal atrial fibrillation) ([place]) 04/24/2015   Palpitations    PONV (postoperative nausea and vomiting)    area on left forearm DO NOT PLACE IV OR STICK ? vascular"will bleed"   Umbilical hernia without obstruction or gangrene, fat containing 03/28/2016   Found on CT, 2017. No issues.    Patient Active Problem List   Diagnosis Date Noted   Class 2 obesity due to excess calories with body mass index (BMI) of 37.0 to 37.9 in adult 10/24/2020   Prediabetes 10/23/2020   NAFLD (nonalcoholic fatty liver disease) 10/23/2020   Osteoarthritis of left hip 02/21/2020   Status post left hip replacement 02/21/2020   Biliary dyskinesia 10/01/2016   History of hysterectomy 03/28/2016   History of appendectomy 35/57/3220   Umbilical hernia without obstruction or gangrene, fat containing 03/28/2016   History of fusion of cervical spine 03/28/2016   Hiatal hernia, small 03/28/2016   Menopausal vasomotor syndrome 03/28/2016   Forearm mass, left 03/28/2016   Morbid obesity (  [place]) (BMI 35 plus 2 comorbidities) 03/06/2016   Chronic diastolic CHF (congestive heart failure) ([place]) 07/20/2015   PAF (paroxysmal atrial fibrillation) ([place]) 04/24/2015   History of lumbar spinal fusion 09/27/2014   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   Left adrenal mass ([place]) 05/12/2013   Back pain 08/03/2012   Allergic rhinitis 04/03/2009   Asthma, mild, intermittent 01/23/2009   History of colonic polyps 04/21/2008   Depression, recurrent ([place]) 03/03/2007   Internal hemorrhoids 03/03/2007   GERD 03/03/2007   Diverticulosis, with history of diverticulitis of descending and sigmoid colon 03/03/2007    Hepatic steatosis, found on CT 03/03/2007   OSA (obstructive sleep apnea) 03/03/2007   Essential hypertension 06/25/2006    Past Surgical History:  Procedure Laterality Date   ABDOMINAL HYSTERECTOMY     APPENDECTOMY     BREAST REDUCTION SURGERY     BUNIONECTOMY     bilateral   CERVICAL FUSION     CHOLECYSTECTOMY N/A 10/01/2016   Procedure: LAPAROSCOPIC CHOLECYSTECTOMY;  Surgeon: Coralie Keens, MD;  Location: [facility];  Service: General;  Laterality: N/A;   COLONOSCOPY WITH PROPOFOL N/A 05/12/2019   Procedure: COLONOSCOPY WITH PROPOFOL;  Surgeon: Juanita Craver, MD;  Location: [facility];  Service: Endoscopy;  Laterality: N/A;   MAXIMUM ACCESS (MAS)POSTERIOR LUMBAR INTERBODY FUSION (PLIF) 2 LEVEL N/A 09/27/2014   Procedure: Maximum Access Surgery Posterior Lumbar Interbody Fusion Lumbar three-four, Lumbar four-five;  Surgeon: Eustace Moore, MD;  Location: [facility];  Service: Neurosurgery;  Laterality: N/A;   POLYPECTOMY  05/12/2019   Procedure: POLYPECTOMY;  Surgeon: Juanita Craver, MD;  Location: [facility];  Service: Endoscopy;;   SPINE SURGERY     TOTAL ABDOMINAL HYSTERECTOMY W/ BILATERAL SALPINGOOPHORECTOMY     TOTAL HIP ARTHROPLASTY Left 02/21/2020   Procedure: TOTAL HIP ARTHROPLASTY ANTERIOR APPROACH;  Surgeon: Paralee Cancel, MD;  Location: [facility];  Service: Orthopedics;  Laterality: Left;  70 mins    OB History     Gravida  2   Para  2   Term      Preterm      AB      Living         SAB      IAB      Ectopic      Multiple      Live Births               Home Medications    Prior to Admission medications   Medication Sig Start Date End Date Taking? Authorizing Provider  benzonatate (TESSALON) 200 MG capsule Take 1 capsule (200 mg total) by mouth 3 (three) times daily as needed for up to 7 days for cough. 12/26/20 01/02/21 Yes Eliezer Lofts, FNP  cefdinir (OMNICEF) 300 MG capsule Take 1 capsule (300 mg total) by mouth 2 (two) times daily for 7 days.  12/26/20 01/02/21 Yes Eliezer Lofts, FNP  fexofenadine [facility] ALLERGY) 180 MG tablet Take 1 tablet (180 mg total) by mouth daily for 15 days. 12/26/20 01/10/21 Yes Eliezer Lofts, FNP  amLODipine (NORVASC) 5 MG tablet Take 1 tablet (5 mg total) by mouth daily. 04/09/20   Nahser, Wonda Cheng, MD  apixaban (ELIQUIS) 5 MG TABS tablet Take 1 tablet (5 mg total) by mouth 2 (two) times daily. 08/13/20   Nahser, Wonda Cheng, MD  Calcium Carbonate-Vitamin D 600-400 MG-UNIT tablet Take 1 tablet by mouth daily.    [provider]  ELDERBERRY PO Take 2 each by mouth daily.    [provider]  metFORMIN (GLUCOPHAGE) 500 MG tablet Take 1 tablet (500 mg total) by mouth daily with breakfast. 12/18/20 01/17/21  Dennard Nip D, MD  metoprolol succinate (TOPROL XL) 25 MG 24 hr tablet Take 1 tablet (25 mg total) by mouth 2 (two) times daily. 09/07/20   Nahser, Wonda Cheng, MD  Multiple Vitamins-Minerals (HAIR SKIN AND NAILS FORMULA PO) Take 1 capsule by mouth in the morning and at bedtime.    [provider]  Probiotic Product ([place]) Take 1 tablet by mouth daily.    [provider]  propranolol (INDERAL) 10 MG tablet Take 1 tablet (10 mg total) by mouth 4 (four) times daily as needed. 11/10/18   Nahser, Wonda Cheng, MD    Family History Family History  Problem Relation Age of Onset   Diabetes Mother    Hypertension Mother    Heart disease Mother    Sleep apnea Mother    Coronary artery disease Sister    Hypertension Sister    Colon cancer Neg Hx     Social History Social History   Tobacco Use   Smoking status: Never   Smokeless tobacco: Never  Vaping Use   Vaping Use: Never used  Substance Use Topics   Alcohol use: No   Drug use: No     Allergies   Other, Losartan, and Tramadol   Review of Systems Review of Systems  HENT:  Positive for congestion.   Respiratory:  Positive for cough.   All other systems reviewed and are negative.   Physical  Exam Triage Vital Signs ED Triage Vitals  Enc Vitals Group     BP 12/26/20 1430 (!) 147/86     Pulse Rate 12/26/20 1430 64     Resp 12/26/20 1430 14     Temp 12/26/20 1430 98.2 F (36.8 C)     Temp Source 12/26/20 1430 Oral     SpO2 12/26/20 1430 98 %     Weight --      Height --      Head Circumference --      Peak Flow --      Pain Score 12/26/20 1432 0     Pain Loc --      Pain Edu? --      Excl. in [place]? --    No data found.  Updated Vital Signs BP (!) 147/86 (BP Location: Left Arm)    Pulse 64    Temp 98.2 F (36.8 C) (Oral)    Resp 14    SpO2 98%       Physical Exam Vitals and nursing note reviewed.  Constitutional:      Appearance: Normal appearance. She is obese.  HENT:     Head: Normocephalic and atraumatic.     Right Ear: Tympanic membrane normal.     Left Ear: Tympanic membrane normal.     Ears:     Comments: Moderate eustachian tube dysfunction noted bilaterally    Nose: Congestion present.     Comments: Turbinates are erythematous and edematous    Mouth/Throat:     Mouth: Mucous membranes are moist.     Pharynx: Oropharynx is clear.     Comments: Moderate amount of clear drainage of posterior oropharynx noted Eyes:     Extraocular Movements: Extraocular movements intact.     Conjunctiva/sclera: Conjunctivae normal.     Pupils: Pupils are equal, round, and reactive to light.  Cardiovascular:     Rate and Rhythm:  Normal rate and regular rhythm.     Pulses: Normal pulses.     Heart sounds: Normal heart sounds.  Pulmonary:     Effort: Pulmonary effort is normal.     Breath sounds: Normal breath sounds.  Musculoskeletal:     Cervical back: Normal range of motion and neck supple.  Skin:    General: Skin is warm and dry.  Neurological:     General: No focal deficit present.     Mental Status: She is alert and oriented to person, place, and time. Mental status is at baseline.     UC Treatments / Results  Labs (all labs ordered are listed, but only  abnormal results are displayed) Labs Reviewed - No data to display  EKG   Radiology No results found.  Procedures Procedures (including critical care time)  Medications Ordered in UC Medications - No data to display  Initial Impression / Assessment and Plan / UC Course  I have reviewed the triage vital signs and the nursing notes.  Pertinent labs & imaging results that were available during my care of the patient were reviewed by me and considered in my medical decision making (see chart for details).     MDM: 1.  Acute maxillary sinusitis-Rx Cefdinir; 2.  Allergic rhinitis-Rx'd Allegra; 3.  Cough-Tessalon perles. Advised patient to take medication as directed with food to completion.  Advised patient to take Allegra with first dose of Cefdinir for the next 5 of 7 days.  Advised may use Allegra as needed afterwards for concurrent postnasal drainage/drip.  Advised may use Tessalon Perles daily or as needed for cough.  Patient discharged home, hemodynamically stable. Final Clinical Impressions(s) / UC Diagnoses   Final diagnoses:  Cough, unspecified type  Acute maxillary sinusitis, recurrence not specified  Allergic rhinitis, unspecified seasonality, unspecified trigger     Discharge Instructions      Advised patient to take medication as directed with food to completion.  Advised patient to take Allegra with first dose of Cefdinir for the next 5 of 7 days.  Advised may use Allegra as needed afterwards for concurrent postnasal drainage/drip.  Advised may use Tessalon Perles daily or as needed for cough.     ED Prescriptions     Medication Sig Dispense Auth. Provider   cefdinir (OMNICEF) 300 MG capsule Take 1 capsule (300 mg total) by mouth 2 (two) times daily for 7 days. 14 capsule Eliezer Lofts, FNP   fexofenadine [facility] ALLERGY) 180 MG tablet Take 1 tablet (180 mg total) by mouth daily for 15 days. 15 tablet Eliezer Lofts, FNP   benzonatate (TESSALON) 200 MG capsule  Take 1 capsule (200 mg total) by mouth 3 (three) times daily as needed for up to 7 days for cough. 40 capsule Eliezer Lofts, FNP      PDMP not reviewed this encounter.   Eliezer Lofts, [place] 12/26/20 1530

## 2020-12-26 NOTE — ED Triage Notes (Signed)
Pt presents with cough, congestion, loss of taste and smell x 1 week.

## 2021-01-10 ENCOUNTER — Other Ambulatory Visit (INDEPENDENT_AMBULATORY_CARE_PROVIDER_SITE_OTHER): Payer: Self-pay | Admitting: Family Medicine

## 2021-01-10 DIAGNOSIS — R7303 Prediabetes: Secondary | ICD-10-CM

## 2021-01-10 NOTE — Telephone Encounter (Signed)
LAST APPOINTMENT DATE: 12/18/20 NEXT APPOINTMENT DATE: 01/22/21   [id number] [place] [address] [place] [address] Phone: [phone] Fax: [phone]  [facility] [place], [place] [address] [place] [address] Phone: [phone] Fax: [phone]  Patient is requesting a refill of the following medications: Requested Prescriptions   Pending Prescriptions Disp Refills   metFORMIN (GLUCOPHAGE) 500 MG tablet [Pharmacy Med Name: METFORMIN HCL 500 MG TABLET] 30 tablet 0    Sig: TAKE 1 TABLET BY MOUTH EVERY DAY WITH BREAKFAST    Date last filled: 12/18/20 Previously prescribed by Dr. Leafy Ro  Lab Results  Component Value Date   HGBA1C 5.6 07/29/2016   HGBA1C 5.8 04/22/2016   HGBA1C 5.8 (H) 01/28/2012   Lab Results  Component Value Date   MICROALBUR 0.6 09/04/2011   LDLCALC 69 09/07/2020   CREATININE 0.90 09/07/2020   Lab Results  Component Value Date   VD25OH 70.2 01/25/2020   VD25OH 62 02/07/2013    BP Readings from Last 3 Encounters:  12/26/20 (!) 147/86  12/18/20 (!) 147/77  11/20/20 (!) 146/84

## 2021-01-10 NOTE — Telephone Encounter (Signed)
Pt last seen by Dr. Beasley.  

## 2021-01-20 DIAGNOSIS — M1611 Unilateral primary osteoarthritis, right hip: Secondary | ICD-10-CM | POA: Insufficient documentation

## 2021-01-22 ENCOUNTER — Encounter (INDEPENDENT_AMBULATORY_CARE_PROVIDER_SITE_OTHER): Payer: Self-pay | Admitting: Family Medicine

## 2021-01-22 ENCOUNTER — Telehealth (INDEPENDENT_AMBULATORY_CARE_PROVIDER_SITE_OTHER): Admitting: Family Medicine

## 2021-01-22 DIAGNOSIS — R7303 Prediabetes: Secondary | ICD-10-CM | POA: Diagnosis not present

## 2021-01-22 DIAGNOSIS — R058 Other specified cough: Secondary | ICD-10-CM | POA: Diagnosis not present

## 2021-01-22 DIAGNOSIS — Z6837 Body mass index (BMI) 37.0-37.9, adult: Secondary | ICD-10-CM

## 2021-01-22 DIAGNOSIS — E669 Obesity, unspecified: Secondary | ICD-10-CM | POA: Diagnosis not present

## 2021-01-22 NOTE — Progress Notes (Signed)
TeleHealth Visit:  Due to the COVID-19 pandemic, this visit was completed with telemedicine (audio/video) technology to reduce patient and provider exposure as well as to preserve personal protective equipment.   Cherese has verbally consented to this TeleHealth visit. The patient is located at home, the provider is located at the [facility] office. The participants in this visit include the listed provider and patient. The visit was conducted today via Mychart video.   Chief Complaint: OBESITY Trellis is here to discuss her progress with her obesity treatment plan along with follow-up of her obesity related diagnoses. Allicia is on the Category 2 Plan and states she is following her eating plan approximately 80% of the time. Daena states she was walking.  Today's visit was #: 7 Starting weight: 231 lbs Starting date: 01/25/2020  Interim History: Almira has done well with avoiding holiday weight gain. She has been sick recently, and has not eaten much outside of soup. She is discouraged that her weight loss isn't greater.    Subjective:   1. Pre-diabetes Dortha is on metformin with no side effects noted. She continues to work on diet and weight loss.   2. Other cough Dominga is recovering from an upper respiratory infection (no Flu, no COVID). She is feeling better but still has a "tickle" cough, worse at night and this affects her sleep.  Assessment/Plan:   1. Pre-diabetes Lua will continue to work on weight loss, exercise, and decreasing simple carbohydrates to help decrease the risk of diabetes.   2. Other cough Kay was educated on post-viral cough, and OTC Delsym was recommended to decrease her symptoms until she is fully healed.  3. Obesity, current BMI 37.5 Brittony is currently in the action stage of change. As such, her goal is to continue with weight loss efforts. She has agreed to the Category 2 Plan.   Leni was reassured that she is doing well and we  discussed expectations and the importance of not being too impatient.   Behavioral modification strategies: increasing lean protein intake, increasing water intake, and no skipping meals.  Naydene has agreed to follow-up with our clinic in 2 to 3 weeks. She was informed of the importance of frequent follow-up visits to maximize her success with intensive lifestyle modifications for her multiple health conditions.  Objective:   VITALS: Per patient if applicable, see vitals. GENERAL: Alert and in no acute distress. CARDIOPULMONARY: No increased WOB. Speaking in clear sentences.  PSYCH: Pleasant and cooperative. Speech normal rate and rhythm. Affect is appropriate. Insight and judgement are appropriate. Attention is focused, linear, and appropriate.  NEURO: Oriented as arrived to appointment on time with no prompting.   Lab Results  Component Value Date   CREATININE 0.90 09/07/2020   BUN 9 09/07/2020   NA 141 09/07/2020   K 4.7 09/07/2020   CL 102 09/07/2020   CO2 24 09/07/2020   Lab Results  Component Value Date   ALT 21 09/07/2020   AST 25 10/01/2018   ALKPHOS 70 10/01/2018   BILITOT 0.5 10/01/2018   Lab Results  Component Value Date   HGBA1C 5.6 07/29/2016   HGBA1C 5.8 04/22/2016   HGBA1C 5.8 (H) 01/28/2012   HGBA1C 5.8 11/14/2009   HGBA1C 5.6 08/21/2009   Lab Results  Component Value Date   INSULIN 9.2 01/25/2020   Lab Results  Component Value Date   TSH 1.670 01/25/2020   Lab Results  Component Value Date   CHOL 137 09/07/2020  HDL 47 09/07/2020   LDLCALC 69 09/07/2020   TRIG 118 09/07/2020   CHOLHDL 2.9 09/07/2020   Lab Results  Component Value Date   VD25OH 70.2 01/25/2020   VD25OH 62 02/07/2013   Lab Results  Component Value Date   WBC 13.0 (H) 02/22/2020   HGB 13.6 02/22/2020   HCT 41.3 02/22/2020   MCV 102.5 (H) 02/22/2020   PLT 325 02/22/2020   Lab Results  Component Value Date   IRON 76 12/13/2007    Attestation Statements:   Reviewed  by clinician on day of visit: allergies, medications, problem list, medical history, surgical history, family history, social history, and previous encounter notes.   I, Trixie Dredge, am acting as transcriptionist for Dennard Nip, MD.  I have reviewed the above documentation for accuracy and completeness, and I agree with the above. - Dennard Nip, MD

## 2021-02-03 ENCOUNTER — Other Ambulatory Visit (INDEPENDENT_AMBULATORY_CARE_PROVIDER_SITE_OTHER): Payer: Self-pay | Admitting: Family Medicine

## 2021-02-03 DIAGNOSIS — R7303 Prediabetes: Secondary | ICD-10-CM

## 2021-02-04 NOTE — Telephone Encounter (Signed)
LAST APPOINTMENT DATE: 01/10/21 NEXT APPOINTMENT DATE: 02/19/21   [id number] [place], [address] [place] [address] Phone: [phone] Fax: [phone]  [facility] [place], [place] [address] [place] [address] Phone: [phone] Fax: [phone]  Patient is requesting a refill of the following medications: Requested Prescriptions   Pending Prescriptions Disp Refills   metFORMIN (GLUCOPHAGE) 500 MG tablet [Pharmacy Med Name: METFORMIN HCL 500 MG TABLET] 30 tablet 0    Sig: TAKE 1 TABLET BY MOUTH EVERY DAY WITH BREAKFAST    Date last filled: 12/18/20 Previously prescribed by Dr. Leafy Ro  Lab Results  Component Value Date   HGBA1C 5.6 07/29/2016   HGBA1C 5.8 04/22/2016   HGBA1C 5.8 (H) 01/28/2012   Lab Results  Component Value Date   MICROALBUR 0.6 09/04/2011   LDLCALC 69 09/07/2020   CREATININE 0.90 09/07/2020   Lab Results  Component Value Date   VD25OH 70.2 01/25/2020   VD25OH 62 02/07/2013    BP Readings from Last 3 Encounters:  12/26/20 (!) 147/86  12/18/20 (!) 147/77  11/20/20 (!) 146/84

## 2021-02-07 ENCOUNTER — Other Ambulatory Visit: Payer: Self-pay | Admitting: Cardiovascular Disease

## 2021-02-07 ENCOUNTER — Other Ambulatory Visit (INDEPENDENT_AMBULATORY_CARE_PROVIDER_SITE_OTHER): Payer: Self-pay | Admitting: Family Medicine

## 2021-02-07 ENCOUNTER — Encounter: Payer: Self-pay | Admitting: Cardiovascular Disease

## 2021-02-07 DIAGNOSIS — R7303 Prediabetes: Secondary | ICD-10-CM

## 2021-02-07 MED ORDER — BENZONATATE 100 MG PO CAPS: 100.0000 mg | ORAL_CAPSULE | Freq: Three times a day (TID) | ORAL | 0 refills | Status: DC | PRN

## 2021-02-07 MED ORDER — ALBUTEROL SULFATE HFA 108 (90 BASE) MCG/ACT IN AERS: 2.0000 | INHALATION_SPRAY | Freq: Four times a day (QID) | RESPIRATORY_TRACT | 2 refills | Status: DC | PRN

## 2021-02-07 NOTE — Telephone Encounter (Signed)
Pt called to report  that she has a cough for 1 month and she has been seen by the Urgent Care twice and each time she had a negative COVID test as well as COVID neg with home testing....and she was treated with a cough suppressant and amoxicillin. She has not had a CXR.[name]    She says she still has the cough and she was coughing hard and deep on the phone with me... she says it burns in her chest near her heart when she coughs... and she is starting to have SOB with exertion.   She has been trying to call her PCP at [facility], Dr. Orland Mustard but his office has not returned her calls...   She is asking to be seen... She is worried that the cough is "affecting" her heart since it has been going on for so long.  She had Pneumonia requiring hospitalization in 2019 and bronchitis.   I advised her to try her PCP once again and I will forward her message to Dr. Acie Fredrickson for his review and recommendation.   She denies any peripheral edema and no chest pain on exertion.

## 2021-02-07 NOTE — Progress Notes (Signed)
Cough, cold, URI symptoms for weeks Not covid  Likely RSV or other viurs  I prescribed Albuterol HFA , 2 putts Q 6 hours PRN Tessalon pearls TID for cough    Mertie Moores, MD  02/07/2021 5:47 PM    [facility] [address]  [place], [place]  [address] Phone: [phone]; Fax: [phone]

## 2021-02-11 NOTE — Telephone Encounter (Signed)
[name], [name] Cheng, MD  P Cv Div [facility] Triage I sent in script for tessalon pears and albuterol HFA last week    Called patient who verified that she did pick up these medications from her pharmacy and states she is feeling better at this time. Instructed to call back if new symptoms arose or if she felt she needed cardiac attention.

## 2021-02-17 ENCOUNTER — Encounter: Payer: Self-pay | Admitting: Cardiovascular Disease

## 2021-02-18 ENCOUNTER — Other Ambulatory Visit: Payer: Self-pay

## 2021-02-18 MED ORDER — APIXABAN 5 MG PO TABS: 5.0000 mg | ORAL_TABLET | Freq: Two times a day (BID) | ORAL | 3 refills | Status: DC

## 2021-02-18 NOTE — Telephone Encounter (Signed)
Prescription refill request for Eliquis received. Indication: Afib  Last office visit:09/07/20 (Nahser)  Scr: 0.90 (09/07/20)  Age: 66 Weight: 102.1kg  Appropriate dose and refill sent to requested pharmacy.

## 2021-02-19 ENCOUNTER — Encounter (INDEPENDENT_AMBULATORY_CARE_PROVIDER_SITE_OTHER): Payer: Self-pay | Admitting: Family Medicine

## 2021-02-19 ENCOUNTER — Ambulatory Visit (INDEPENDENT_AMBULATORY_CARE_PROVIDER_SITE_OTHER): Admitting: Family Medicine

## 2021-02-19 NOTE — Telephone Encounter (Signed)
Please see message and advise.  Thank you. ° °

## 2021-02-22 ENCOUNTER — Encounter: Payer: Self-pay | Admitting: Cardiovascular Disease

## 2021-02-22 MED ORDER — METOPROLOL SUCCINATE ER 25 MG PO TB24: 25.0000 mg | ORAL_TABLET | Freq: Two times a day (BID) | ORAL | 3 refills | Status: DC

## 2021-02-22 MED ORDER — AMLODIPINE BESYLATE 5 MG PO TABS: 5.0000 mg | ORAL_TABLET | Freq: Every day | ORAL | 3 refills | Status: DC

## 2021-03-22 ENCOUNTER — Telehealth: Payer: Self-pay | Admitting: *Deleted

## 2021-03-22 NOTE — Telephone Encounter (Signed)
? ?  Pre-operative Risk Assessment  ?  ?Patient Name: [name]  ?DOB: 05/17/1955 ?MRN: [id number]  ? ?  ? ?Request for Surgical Clearance   ? ?Procedure:   RIGHT TOTAL HIP ARTHROPLASTY ? ?Date of Surgery:  Clearance 06/04/21                              ?   ?Surgeon:  DR. MATTHEW OLIN ?Surgeon's Group or Practice Name:  [facility] ?Phone number:  [phone] ?Fax number:  [phone] ATTN: Orson Slick ?  ?Type of Clearance Requested:   ?- Medical  ?- Pharmacy:  Hold Apixaban (Eliquis)   ?  ?Type of Anesthesia:   CHOICE ?  ?Additional requests/questions:   ? ?Signed, ?Julaine Hua   ?03/22/2021, 12:07 PM  ? ?

## 2021-03-26 NOTE — Telephone Encounter (Signed)
S/w the pt and she is agreeable to lab work for pre op clearance. Pt did ask if she needed an appt as well. I did not see that the pre op provider noted the pt needed an appt, however the pt was seen last seen 09/07/20. I have scheduled an in office appt for the pt with Richardson Dopp, [facility] 04/10/21 @ 3:10, same day Dr. Acie Fredrickson is in the office. Pt thanked me for the call and the help. I will forward notes to [facility] for upcoming appt. Will send FYI to requesting office pt has appt  ?

## 2021-03-26 NOTE — Telephone Encounter (Signed)
Patient with diagnosis of atrial fibrillation on Eliquis for anticoagulation.   ? ?Procedure: right total hip arthroplasty ?Date of procedure: 06/04/21 ? ? ?CHA2DS2-VASc Score = 3  ? This indicates a 3.2% annual risk of stroke. ?The patient's score is based upon: ?CHF History: 1 ?HTN History: 1 ?Diabetes History: 0 ?Stroke History: 0 ?Vascular Disease History: 0 ?Age Score: 0 ?Gender Score: 1 ?  ? ?CrCl 76 ?NEED UPDATED PLATELET COUNT FOR FINAL APPROVAL ? ? ? ?

## 2021-03-27 NOTE — Telephone Encounter (Signed)
Pt preferred in office appt  ?

## 2021-04-01 NOTE — Telephone Encounter (Signed)
? ?  Patient Name: [name]  ?DOB: 09-02-1955 ?MRN: [id number] ? ?Primary Cardiologist: Mertie Moores, MD ? ?Chart reviewed as part of pre-operative protocol coverage. Though patient has been seen within the last 1 year, it does appear she wrote in earlier this year requesting to be seen, also as below requested an in-office visit, so will keep this plan as outlined. Will need labs at that visit for pharm team to finalize anticoag recsPer office protocol, the provider seeing this patient should forward their finalized clearance decision to requesting party below.  ? ?I will remove this message from the pre-op box. Callback CMA already sent FYI to requesting surgeon and upcoming APP of plan for appt. ? ?Charlie Pitter, PA-C ?04/01/2021, 8:46 AM ? ? ?

## 2021-04-10 ENCOUNTER — Ambulatory Visit (INDEPENDENT_AMBULATORY_CARE_PROVIDER_SITE_OTHER): Admitting: Physician Assistant

## 2021-04-10 ENCOUNTER — Encounter: Payer: Self-pay | Admitting: Physician Assistant

## 2021-04-10 VITALS — BP 138/76 | HR 67 | Ht 65.0 in | Wt 221.8 lb

## 2021-04-10 DIAGNOSIS — I10 Essential (primary) hypertension: Secondary | ICD-10-CM

## 2021-04-10 DIAGNOSIS — Z01810 Encounter for preprocedural cardiovascular examination: Secondary | ICD-10-CM

## 2021-04-10 DIAGNOSIS — I480 Paroxysmal atrial fibrillation: Secondary | ICD-10-CM

## 2021-04-10 DIAGNOSIS — I5032 Chronic diastolic (congestive) heart failure: Secondary | ICD-10-CM

## 2021-04-10 NOTE — Assessment & Plan Note (Addendum)
[name]'s perioperative risk of a major cardiac event is 0.4% according to the Revised Cardiac Risk Index (RCRI).  Therefore, she is at low risk for perioperative complications.   Her functional capacity is fair at 4.4 METs according to the Duke Activity Status Index (DASI). ?Recommendations: ?According to ACC/AHA guidelines, no further cardiovascular testing needed.  The patient may proceed to surgery at acceptable risk.   ?Antiplatelet and/or Anticoagulation Recommendations: ?[name] from her PCP to obtain recent Plt count and then review duration to hold Eliquis. ?Addendum: Hgb, Plt count, Creatinine stable.  Reviewed with PharmD.  Patient can hold Eliquis for 3 days prior to hip surgery and resume as soon as possible post op.  ?

## 2021-04-10 NOTE — Assessment & Plan Note (Signed)
Maintaining normal sinus rhythm.  She is tolerating anticoagulation.  [facility] labs reviewed and interpreted.  On 04/02/21: Hgb 15, Creatinine 0.81.  Wt > 60 kg and age < 64.  Continue Eliquis 5 mg twice daily, Toprol XL 25 mg once daily, prn Propranolol. F/u 6 mos.  ?

## 2021-04-10 NOTE — Assessment & Plan Note (Signed)
Fair control.  Continue norvasc 5 mg once daily, Toprol XL 25 mg once daily.  ?

## 2021-04-10 NOTE — Telephone Encounter (Signed)
Updated labs are stable. She should hold Eliquis for 3 days prior to THA, spinal anesthesia is typically used. ?

## 2021-04-10 NOTE — Progress Notes (Addendum)
?Cardiology Office Note:   ? ?Date:  04/10/2021  ? ?ID:  KAAVYA PUSKARICH, DOB 1955/10/27, MRN [id number] ? ?PCP:  London Pepper, MD  ?[facility] Providers ?Cardiologist:  Mertie Moores, MD    ?Referring MD: London Pepper, MD  ? ?Chief Complaint:  surgical clearance ?  ? ?Patient Profile: ?Paroxysmal Atrial fibrillation/flutter ?(HFpEF) heart failure with preserved ejection fraction  ?Cardiac catheterization in 2010: Normal coronary arteries ?Hypertension  ?Sleep apnea  ?Asthma  ?GERD ?Obesity  ? ?Prior CV Studies: ?Myoview 04/06/2015 ?EF 61, no ischemia or scar; low risk ? ?Echocardiogram 04/06/2015 ?EF 55-60, no RWMA, GRII DD, trivial MR, trivial TR ? ?Cardiac catheterization 07/19/2008 ?Normal coronary arteries ?EF 70 ? ?CCTA 07/19/2008 ?CAC score 67.7 (>70 5th percentile) ?LAD calcification with questionable hemodynamic significance ? ?History of Present Illness:   ?[name] is a 66 y.o. female with the above problem list.  She was last seen by Dr. Acie Fredrickson in September 2022.  She returns for surgical clearance.  She needs a right total hip arthroplasty with Dr. Alvan Dame on 05/14/2021.  She needs her Eliquis held.  Type of anesthesia is unknown.   She is here today with her husband.  She was very active up until the last couple of months when her hip pain worsened.  She was able to walk up steps or a couple of blocks prior to this. She is still able to do household chores or walk short distances outside.  She has not had chest pain, shortness of breath, syncope, orthopnea.  She may have some mild edema in her ankle on the R.   ?   ?Past Medical History:  ?Diagnosis Date  ? Allergic rhinitis 04/03/2009  ? Anemia   ? hx  ? Arthritis   ? Asthma, mild, intermittent 01/23/2009  ? Qualifier: Diagnosis of  By: Jerold Coombe - denies  ? Back pain 08/03/2012  ? 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 Chronic diastolic CHF (congestive heart failure) ([place]) 07/20/2015  ? Depression, recurrent ([place]) 03/03/2007  ?  Qualifier: Diagnosis of  By: Nils Pyle CMA (AAMA), Mearl Latin  -denies  ? Difficult intubation   ? 04/09/04 with difficult intubation. unabale to pass ETT with Sabra Heck 2; unable to visualize cord with MAC 3; intubating LMA used; uneventful intubation using Glidescope 09/27/14  ? Diverticulosis, with history of diverticulitis of descending and sigmoid colon 03/03/2007  ? Dysrhythmia   ? atrial fib  ? Edema of both lower legs   ? Essential hypertension 06/25/2006  ? Qualifier: Diagnosis of  By: Tiney Rouge CMA, Ellison Hughs    ? Fatty liver   ? Gastritis   ? GERD 03/03/2007  ? Qualifier: Diagnosis of  By: Nils Pyle CMA (AAMA), Mearl Latin   Qualifier: Diagnosis of  By: Shane Crutch, [name]  pt denies at preop of 02/15/20   ? Hepatic steatosis, found on CT 03/03/2007  ?    ? Hiatal hernia, small 03/28/2016  ? EGD by [facility] 12/09/12.   ? Hip pain   ? History of colonic polyps 04/21/2008  ? Last Colonoscopy was 12/09/12, Dr. Fuller Plan. No polyp at that time. Repeat in 5 years.   ? Internal hemorrhoids 03/03/2007  ? Last Colonoscopy was 12/09/12, Dr. Fuller Plan. Repeat in 5 years.   ? Joint pain   ? Lactose intolerance   ? Left adrenal mass ([place]) 05/12/2013  ? Menopausal vasomotor syndrome 03/28/2016  ? Morbid obesity ([place]) (BMI 35 plus 2 comorbidities) 03/06/2016  ? Comorbid conditions include HTN, HLD, hepatic  steatosis, OA, GERD, OSA.  ? OSA (obstructive sleep apnea) 03/03/2007  ? cpap   ? PAF (paroxysmal atrial fibrillation) ([place]) 04/24/2015  ? Palpitations   ? PONV (postoperative nausea and vomiting)   ? area on left forearm DO NOT PLACE IV OR STICK ? vascular"will bleed"  ? Umbilical hernia without obstruction or gangrene, fat containing 03/28/2016  ? Found on CT, 2017. No issues.  ? ?Current Medications: ?Current Meds  ?Medication Sig  ? albuterol (VENTOLIN HFA) 108 (90 Base) MCG/ACT inhaler Inhale 2 puffs into the lungs every 6 (six) hours as needed for wheezing or shortness of breath.  ? amLODipine (NORVASC) 5 MG tablet Take 1 tablet (5 mg total) by mouth daily.  ?  apixaban (ELIQUIS) 5 MG TABS tablet Take 1 tablet (5 mg total) by mouth 2 (two) times daily.  ? Calcium Carbonate-Vitamin D 600-400 MG-UNIT tablet Take 1 tablet by mouth daily.  ? ELDERBERRY PO Take 2 each by mouth daily.  ? metFORMIN (GLUCOPHAGE) 500 MG tablet TAKE 1 TABLET BY MOUTH EVERY DAY WITH BREAKFAST  ? metoprolol succinate (TOPROL XL) 25 MG 24 hr tablet Take 1 tablet (25 mg total) by mouth 2 (two) times daily.  ? Multiple Vitamins-Minerals (HAIR SKIN AND NAILS FORMULA PO) Take 1 capsule by mouth in the morning and at bedtime.  ? Probiotic Product (ULTRAFLORA IMMUNE HEALTH PO) Take 1 tablet by mouth daily.  ? propranolol (INDERAL) 10 MG tablet Take 1 tablet (10 mg total) by mouth 4 (four) times daily as needed.  ?  ?Allergies:   Other, Losartan, and Tramadol  ? ?Social History  ? ?Tobacco Use  ? Smoking status: Never  ? Smokeless tobacco: Never  ?Vaping Use  ? Vaping Use: Never used  ?Substance Use Topics  ? Alcohol use: No  ? Drug use: No  ?  ?Family Hx: ?The patient's family history includes Coronary artery disease in her sister; Diabetes in her mother; Heart disease in her mother; Hypertension in her mother and sister; Sleep apnea in her mother. There is no history of Colon cancer. ? ?Review of Systems  ?Musculoskeletal:  Positive for joint pain.  ?Gastrointestinal:  Negative for hematochezia.  ?Genitourinary:  Negative for hematuria.   ? ?EKGs/Labs/Other Test Reviewed:   ? ?EKG:  EKG is   ordered today.  The ekg ordered today demonstrates normal sinus rhythm, HR 67, normal axis, non-specific ST-TW changes, QTc 412 ms, no change from prior EKG ? ?Recent Labs: ?09/07/2020: ALT 21; BUN 9; Creatinine, Ser 0.90; Potassium 4.7; Sodium 141  ? ?Recent Lipid Panel ?Recent Labs  ?  09/07/20 ?1349  ?CHOL 137  ?TRIG 118  ?HDL 47  ?[place] 69  ?  ?Labs obtained through [facility] - personally reviewed and interpreted: ?Total cholesterol 111, HDL 41, LDL 53, triglycerides 88, A1c 5.5, hemoglobin 15, creatinine 0.81, K+ 4,  ALT 18, TSH 1.16, platelet count 317,000 ? ?Risk Assessment/Calculations:   ? ?CHA2DS2-VASc Score = 3  ? This indicates a 3.2% annual risk of stroke. ?The patient's score is based upon: ?CHF History: 1 ?HTN History: 1 ?Diabetes History: 0 ?Stroke History: 0 ?Vascular Disease History: 0 ?Age Score: 0 ?Gender Score: 1 ?  ? ?    ?Physical Exam:   ? ?VS:  BP 138/76   Pulse 67   Ht _0  (1.651 m)   Wt 221 lb 12.8 oz (100.6 kg)   SpO2 98%   BMI 36.91 kg/m?    ? ?Wt Readings from Last 3 Encounters:  ?  04/10/21 221 lb 12.8 oz (100.6 kg)  ?12/18/20 225 lb (102.1 kg)  ?11/20/20 225 lb (102.1 kg)  ?  ?Constitutional:   ?   Appearance: Healthy appearance. Not in distress.  ?Neck:  ?   Vascular: No JVR. JVD normal.  ?   Lymphadenopathy: No cervical adenopathy.  ?Pulmonary:  ?   Effort: Pulmonary effort is normal.  ?   Breath sounds: No wheezing. No rales.  ?Cardiovascular:  ?   Normal rate. Regular rhythm. Normal S1. Normal S2.   ?   Murmurs: There is no murmur.  ?Edema: ?   Peripheral edema present. ?   Ankle: bilateral trace edema of the ankle. ?Abdominal:  ?   Palpations: Abdomen is soft.  ?Skin: ?   General: Skin is warm and dry.  ?Neurological:  ?   Mental Status: Alert and oriented to person, place and time.  ?   Cranial Nerves: Cranial nerves are intact.  ?  ?    ?ASSESSMENT & PLAN:   ?Preoperative cardiovascular examination ?Ms. Pantaleo's perioperative risk of a major cardiac event is 0.4% according to the Revised Cardiac Risk Index (RCRI).  Therefore, she is at low risk for perioperative complications.   Her functional capacity is fair at 4.4 METs according to the Duke Activity Status Index (DASI). ?Recommendations: ?According to ACC/AHA guidelines, no further cardiovascular testing needed.  The patient may proceed to surgery at acceptable risk.   ?Antiplatelet and/or Anticoagulation Recommendations: ?[name] from her PCP to obtain recent Plt count and then review duration to hold  Eliquis. ?Addendum: Hgb, Plt count, Creatinine stable.  Reviewed with PharmD.  Patient can hold Eliquis for 3 days prior to hip surgery and resume as soon as possible post op.  ? ?Chronic heart failure with preserved ejectio

## 2021-04-10 NOTE — Patient Instructions (Signed)
Medication Instructions:  ?Your physician recommends that you continue on your current medications as directed. Please refer to the Current Medication list given to you today. ?n ?*If you need a refill on your cardiac medications before your next appointment, please call your pharmacy* ? ? ?Lab Work: ? None ordered ? ?If you have labs (blood work) drawn today and your tests are completely normal, you will receive your results only by: ?MyChart Message (if you have MyChart) OR ?A paper copy in the mail ?If you have any lab test that is abnormal or we need to change your treatment, we will call you to review the results. ? ? ?Testing/Procedures: ?None ordered ? ? ?Follow-Up: ?At [facility], you and your health needs are our priority.  As part of our continuing mission to provide you with exceptional heart care, we have created designated Provider Care Teams.  These Care Teams include your primary Cardiologist (physician) and Advanced Practice Providers (APPs -  Physician Assistants and Nurse Practitioners) who all work together to provide you with the care you need, when you need it. ? ?We recommend signing up for the patient portal called "MyChart".  Sign up information is provided on this After Visit Summary.  MyChart is used to connect with patients for Virtual Visits (Telemedicine).  Patients are able to view lab/test results, encounter notes, upcoming appointments, etc.  Non-urgent messages can be sent to your provider as well.   ?To learn more about what you can do with MyChart, go to [email or web address].   ? ?Your next appointment:   ?6 month(s) ? ?The format for your next appointment:   ?In Person ? ?Provider:   ?Mertie Moores, MD   ? ? ?Other Instructions ? ? ?

## 2021-04-10 NOTE — Telephone Encounter (Signed)
Notes faxed to surgeon. ?Richardson Dopp, PA-C  ?04/10/2021 4:27 PM  ?

## 2021-04-10 NOTE — Assessment & Plan Note (Signed)
Normal EF on echocardiogram in 2017.  Volume status stable.  She does not require diuretic Rx. ?

## 2021-04-10 NOTE — Telephone Encounter (Signed)
I saw her today for surgical clearance.  We were waiting on her updated labs. She saw her PCP and has labs 04/02/21: Hgb 15, Creatinine 0.81, Platelets 317K. ?Ok to hold Eliquis for 2 days prior to surgery? ?Thanks ?Vergene Marland  ? ?Richardson Dopp, PA-C    ?04/10/2021 4:16 PM   ?

## 2021-05-01 NOTE — Patient Instructions (Addendum)
DUE TO COVID-19 ONLY TWO VISITORS  (aged 64 and older)  ARE ALLOWED TO COME WITH YOU AND STAY IN THE WAITING ROOM ONLY DURING PRE OP AND PROCEDURE.   ?**NO VISITORS ARE ALLOWED IN THE SHORT STAY AREA OR RECOVERY ROOM!!** ? ?IF YOU WILL BE ADMITTED INTO THE HOSPITAL YOU ARE ALLOWED ONLY FOUR SUPPORT PEOPLE DURING VISITATION HOURS ONLY (7 AM -8PM)   ?The support person(s) must pass our screening, gel in and out, and wear a mask at all times, including in the patient?s room. ?Patients must also wear a mask when staff or their support person are in the room. ?Visitors GUEST BADGE MUST BE WORN VISIBLY  ?One adult visitor may remain with you overnight and MUST be in the room by 8 P.M. ?  ? ? Your procedure is scheduled on: 05/14/21 ? ? Report to [facility] Main Entrance ? ?  Report to admitting at 6:00 AM ? ? Call this number if you have problems the morning of surgery [phone] ? ? Do not eat food :After Midnight. ? ? After Midnight you may have the following liquids until 5:40 AM DAY OF SURGERY ? ?Water ?Black Coffee (sugar ok, NO MILK/CREAM OR CREAMERS)  ?Tea (sugar ok, NO MILK/CREAM OR CREAMERS) regular and decaf                             ?Plain Jell-O (NO RED)                                           ?Fruit ices (not with fruit pulp, NO RED)                                     ?Popsicles (NO RED)                                                                  ?Juice: apple, WHITE grape, WHITE cranberry ?Sports drinks like Gatorade (NO RED) ?Clear broth(vegetable,chicken,beef)  ?  ?The day of surgery:  ?Drink ONE (1) Pre-Surgery G2 at 5:40 AM the morning of surgery. Drink in one sitting. Do not sip.  ?This drink was given to you during your hospital  ?pre-op appointment visit. ?Nothing else to drink after completing the  ?Pre-Surgery G2. ?  ?       If you have questions, please contact your surgeon?s office. ? ? ?FOLLOW BOWEL PREP AND ANY ADDITIONAL PRE OP INSTRUCTIONS YOU RECEIVED FROM YOUR SURGEON'S  OFFICE!!! ?  ?  ?Oral Hygiene is also important to reduce your risk of infection.                                    ?Remember - BRUSH YOUR TEETH THE MORNING OF SURGERY WITH YOUR REGULAR TOOTHPASTE ? ? Take these medicines the morning of surgery with A SIP OF WATER: Inhalers, Amlodipine, Metoprolol, Propranolol.  ? ?These are anesthesia recommendations for holding your anticoagulants.  Please  contact your prescribing physician to confirm IF it is safe to hold your anticoagulants for this length of time. ? ? ?Eliquis Apixaban   72 hours   ?Xarelto Rivaroxaban   72 hours  ?Plavix Clopidogrel   120 hours  ?Pletal Cilostazol   120 hours  ? ?Last dose of Plavix 05/10/21. ? ?Bring CPAP mask and tubing day of surgery. ?                  ?           You may not have any metal on your body including hair pins, jewelry, and body piercing ? ?           Do not wear make-up, lotions, powders, perfumes, or deodorant ? ?Do not wear nail polish including gel and S&S, artificial/acrylic nails, or any other type of covering on natural nails including finger and toenails. If you have artificial nails, gel coating, etc. that needs to be removed by a nail salon please have this removed prior to surgery or surgery may need to be canceled/ delayed if the surgeon/ anesthesia feels like they are unable to be safely monitored.  ? ?Do not shave  48 hours prior to surgery.  ? ? Do not bring valuables to the hospital. [facility] NOT ?            RESPONSIBLE   FOR VALUABLES. ? ? Contacts, dentures or bridgework may not be worn into surgery. ? ? Bring small overnight bag day of surgery. ? ? Special Instructions: Bring a copy of your healthcare power of attorney and living will documents         the day of surgery if you haven't scanned them before. ? ?            Please read over the following fact sheets you were given: IF [place] [phone]- Apolonio Schneiders ? ?   [place] - Preparing for  Surgery ?Before surgery, you can play an important role.  Because skin is not sterile, your skin needs to be as free of germs as possible.  You can reduce the number of germs on your skin by washing with CHG (chlorahexidine gluconate) soap before surgery.  CHG is an antiseptic cleaner which kills germs and bonds with the skin to continue killing germs even after washing. ?Please DO NOT use if you have an allergy to CHG or antibacterial soaps.  If your skin becomes reddened/irritated stop using the CHG and inform your nurse when you arrive at Short Stay. ?Do not shave (including legs and underarms) for at least 48 hours prior to the first CHG shower.  You may shave your face/neck. ? ?Please follow these instructions carefully: ? 1.  Shower with CHG Soap the night before surgery and the  morning of surgery. ? 2.  If you choose to wash your hair, wash your hair first as usual with your normal  shampoo. ? 3.  After you shampoo, rinse your hair and body thoroughly to remove the shampoo.                            ? 4.  Use CHG as you would any other liquid soap.  You can apply chg directly to the skin and wash.  Gently with a scrungie or clean washcloth. ? 5.  Apply the CHG Soap to your body ONLY FROM THE  NECK DOWN.   Do   not use on face/ open      ?                     Wound or open sores. Avoid contact with eyes, ears mouth and   genitals (private parts).  ?                     Production manager,  Genitals (private parts) with your normal soap. ?            6.  Wash thoroughly, paying special attention to the area where your    surgery  will be performed. ? 7.  Thoroughly rinse your body with warm water from the neck down. ? 8.  DO NOT shower/wash with your normal soap after using and rinsing off the CHG Soap. ?               9.  Pat yourself dry with a clean towel. ?           10.  Wear clean pajamas. ?           11.  Place clean sheets on your bed the night of your first shower and do not  sleep with pets. ?Day of Surgery  : ?Do not apply any lotions/deodorants the morning of surgery.  Please wear clean clothes to the hospital/surgery center. ? ?FAILURE TO FOLLOW THESE INSTRUCTIONS MAY RESULT IN THE CANCELLATION OF YOUR SURGERY ? ?PATIENT SIGNATURE_________________________________ ? ?NURSE SIGNATURE__________________________________ ? ?________________________________________________________________________  ? ?Incentive Spirometer ? ?An incentive spirometer is a tool that can help keep your lungs clear and active. This tool measures how well you are filling your lungs with each breath. Taking long deep breaths may help reverse or decrease the chance of developing breathing (pulmonary) problems (especially infection) following: ?A long period of time when you are unable to move or be active. ?BEFORE THE PROCEDURE  ?If the spirometer includes an indicator to show your best effort, your nurse or respiratory therapist will set it to a desired goal. ?If possible, sit up straight or lean slightly forward. Try not to slouch. ?Hold the incentive spirometer in an upright position. ?INSTRUCTIONS FOR USE  ?Sit on the edge of your bed if possible, or sit up as far as you can in bed or on a chair. ?Hold the incentive spirometer in an upright position. ?Breathe out normally. ?Place the mouthpiece in your mouth and seal your lips tightly around it. ?Breathe in slowly and as deeply as possible, raising the piston or the ball toward the top of the column. ?Hold your breath for 3-5 seconds or for as long as possible. Allow the piston or ball to fall to the bottom of the column. ?Remove the mouthpiece from your mouth and breathe out normally. ?Rest for a few seconds and repeat Steps 1 through 7 at least 10 times every 1-2 hours when you are awake. Take your time and take a few normal breaths between deep breaths. ?The spirometer may include an indicator to show your best effort. Use the indicator as a goal to work toward during each repetition. ?After  each set of 10 deep breaths, practice coughing to be sure your lungs are clear. If you have an incision (the cut made at the time of surgery), support your incision when coughing by placing a pillow or rolle

## 2021-05-01 NOTE — Progress Notes (Addendum)
COVID Vaccine Completed: yes x3 ?Date COVID Vaccine completed: ?Has received booster: ?COVID vaccine manufacturer: [employer]  ? ?Date of COVID positive in last 90 days: no ? ?PCP - London Pepper, MD ?Cardiologist - Mertie Moores, MD ? ?Cardiac clearance 04/10/21 by Richardson Dopp in [place]. ?Medical clearance 04/07/21 by London Pepper on chart ? ?Chest x-ray - n/a ?EKG - 04/10/21 Epic ?Stress Test - 04/05/21 Epic ?ECHO - 04/05/21 Epic ?Cardiac Cath - over 10 years ago per pt, no stents ?Pacemaker/ICD device last checked: no ?Spinal Cord Stimulator: no ? ?Bowel Prep - no ? ?Sleep Study - yes, positive ?CPAP - doesn't use ? ?Fasting Blood Sugar - pre, pt says 'all good now' since weight loss ?Checks Blood Sugar _____ times a day ? ?Blood Thinner Instructions: Eliquis, hold 3 days before surgery ?Aspirin Instructions: ?Last Dose: 05/10/21 1900 ? ?Activity level:  Can go up a flight of stairs and perform activities of daily living without stopping and without symptoms of chest pain or shortness of breath. ?  Able to exercise without symptoms ? ?Unable to go up a flight of stairs without symptoms of  ?   ? ?Anesthesia review: HTN, PAF, CHF, asthma, OSA, hepatic stenosis, pre DM, PONV, palpitations,  ? ?Patient denies shortness of breath, fever, cough and chest pain at PAT appointment ? ? ?Patient verbalized understanding of instructions that were given to them at the PAT appointment. Patient was also instructed that they will need to review over the PAT instructions again at home before surgery.  ?

## 2021-05-02 ENCOUNTER — Encounter (HOSPITAL_COMMUNITY)
Admission: RE | Admit: 2021-05-02 | Discharge: 2021-05-02 | Disposition: A | Discharge status: Home / Self Care | Source: Ambulatory Visit | Attending: Orthopedic Surgery | Admitting: Orthopedic Surgery

## 2021-05-02 ENCOUNTER — Encounter (HOSPITAL_COMMUNITY): Payer: Self-pay

## 2021-05-02 VITALS — BP 151/85 | HR 64 | Temp 97.8°F | Resp 16 | Ht 65.0 in | Wt 215.0 lb

## 2021-05-02 DIAGNOSIS — I5032 Chronic diastolic (congestive) heart failure: Secondary | ICD-10-CM | POA: Insufficient documentation

## 2021-05-02 DIAGNOSIS — I110 Hypertensive heart disease with heart failure: Secondary | ICD-10-CM | POA: Insufficient documentation

## 2021-05-02 DIAGNOSIS — K760 Fatty (change of) liver, not elsewhere classified: Secondary | ICD-10-CM | POA: Insufficient documentation

## 2021-05-02 DIAGNOSIS — M1611 Unilateral primary osteoarthritis, right hip: Secondary | ICD-10-CM | POA: Diagnosis not present

## 2021-05-02 DIAGNOSIS — R7303 Prediabetes: Secondary | ICD-10-CM | POA: Insufficient documentation

## 2021-05-02 DIAGNOSIS — Z01818 Encounter for other preprocedural examination: Secondary | ICD-10-CM

## 2021-05-02 DIAGNOSIS — Z7901 Long term (current) use of anticoagulants: Secondary | ICD-10-CM | POA: Diagnosis not present

## 2021-05-02 DIAGNOSIS — Z01812 Encounter for preprocedural laboratory examination: Secondary | ICD-10-CM | POA: Insufficient documentation

## 2021-05-02 DIAGNOSIS — I480 Paroxysmal atrial fibrillation: Secondary | ICD-10-CM | POA: Insufficient documentation

## 2021-05-02 DIAGNOSIS — G4733 Obstructive sleep apnea (adult) (pediatric): Secondary | ICD-10-CM | POA: Diagnosis not present

## 2021-05-02 LAB — COMPREHENSIVE METABOLIC PANEL
ALT: 20 U/L (ref 0–44)
AST: 18 U/L (ref 15–41)
Albumin: 3.9 g/dL (ref 3.5–5.0)
Alkaline Phosphatase: 62 U/L (ref 38–126)
Anion gap: 8 (ref 5–15)
BUN: 12 mg/dL (ref 8–23)
CO2: 26 mmol/L (ref 22–32)
Calcium: 9.2 mg/dL (ref 8.9–10.3)
Chloride: 104 mmol/L (ref 98–111)
Creatinine, Ser: 0.69 mg/dL (ref 0.44–1.00)
GFR, Estimated: >60 mL/min (ref >60)
Glucose, Bld: 97 mg/dL (ref 70–99)
Potassium: 4.1 mmol/L (ref 3.5–5.1)
Sodium: 138 mmol/L (ref 135–145)
Total Bilirubin: 0.9 mg/dL (ref 0.3–1.2)
Total Protein: 7.4 g/dL (ref 6.5–8.1)

## 2021-05-02 LAB — TYPE AND SCREEN
ABO/RH(D): A POS
Antibody Screen: NEGATIVE

## 2021-05-02 LAB — SURGICAL PCR SCREEN
MRSA, PCR: NEGATIVE
Staphylococcus aureus: NEGATIVE

## 2021-05-02 LAB — CBC
HCT: 43.4 % (ref 36.0–46.0)
Hemoglobin: 14.5 g/dL (ref 12.0–15.0)
MCH: 33 pg (ref 26.0–34.0)
MCHC: 33.4 g/dL (ref 30.0–36.0)
MCV: 98.6 fL (ref 80.0–100.0)
Platelets: 337 10*3/uL (ref 150–400)
RBC: 4.4 MIL/uL (ref 3.87–5.11)
RDW: 12.5 % (ref 11.5–15.5)
WBC: 5.6 10*3/uL (ref 4.0–10.5)
nRBC: 0 % (ref 0.0–0.2)

## 2021-05-02 LAB — HEMOGLOBIN A1C
Hgb A1c MFr Bld: 5.5 % (ref 4.8–5.6)
Mean Plasma Glucose: 111.15 mg/dL

## 2021-05-03 NOTE — Anesthesia Preprocedure Evaluation (Deleted)
Anesthesia Evaluation  ? ? ?History of Anesthesia Complications ?(+) PONV, DIFFICULT AIRWAY and history of anesthetic complications ? ?Airway ? ? ? ? ? ? ? Dental ?  ?Pulmonary ?asthma , sleep apnea ,  ?  ? ? ? ? ? ? ? Cardiovascular ?hypertension, +CHF  ?+ dysrhythmias  ? ? ?  ?Neuro/Psych ?PSYCHIATRIC DISORDERS Depression  Neuromuscular disease   ? GI/Hepatic ?hiatal hernia, GERD  ,  ?Endo/Other  ? ? Renal/GU ?  ? ?  ?Musculoskeletal ? ?(+) Arthritis ,  ? Abdominal ?  ?Peds ? Hematology ?  ?Anesthesia Other Findings ? ? Reproductive/Obstetrics ? ?  ? ? ? ? ? ? ? ? ? ? ? ? ? ?  ?  ? ? ? ? ? ? ?                                  Anesthesia Evaluation  ?Patient identified by MRN, date of birth, ID band ?Patient awake ? ? ? ?Reviewed: ?Allergy & Precautions, NPO status , Patient's Chart, lab work & pertinent test results ? ?History of Anesthesia Complications ?(+) PONV, DIFFICULT AIRWAY and history of anesthetic complications ? ?Airway ?Mallampati: III ? ?TM Distance: <3 FB ?Neck ROM: Limited ? ? ? Dental ? ?(+) Partial Upper, Dental Advisory Given ?  ?Pulmonary ?asthma , sleep apnea ,  ?  ?Pulmonary exam normal ? ? ? ? ? ? ? Cardiovascular ?hypertension, Pt. on medications and Pt. on home beta blockers ?+CHF  ?+ dysrhythmias Atrial Fibrillation  ?Rhythm:Regular Rate:Normal ? ? ?  ?Neuro/Psych ?PSYCHIATRIC DISORDERS Depression   ? GI/Hepatic ?Neg liver ROS, hiatal hernia, GERD  ,  ?Endo/Other  ?negative endocrine ROS ? Renal/GU ?negative Renal ROS  ? ?  ?Musculoskeletal ? ?(+) Arthritis ,  ? Abdominal ?Normal abdominal exam  (+)   ?Peds ? Hematology ?negative hematology ROS ?(+)   ?Anesthesia Other Findings ? ? Reproductive/Obstetrics ? ?  ? ? ? ? ? ? ? ? ? ? ? ? ? ?  ?  ? ? ? ? ? ? ?Anesthesia Physical ?Anesthesia Plan ? ?ASA: III ? ?Anesthesia Plan: General  ? ?Post-op Pain Management:   ? ?Induction: Intravenous ? ?PONV Risk Score and Plan: 4 or greater and TIVA, Ondansetron,  Propofol infusion and Midazolam ? ?Airway Management Planned: Oral ETT and Video Laryngoscope Planned ? ?Additional Equipment: None ? ?Intra-op Plan:  ? ?Post-operative Plan: Extubation in OR ? ?Informed Consent: I have reviewed the patients History and Physical, chart, labs and discussed the procedure including the risks, benefits and alternatives for the proposed anesthesia with the patient or authorized representative who has indicated his/her understanding and acceptance.  ? ? ? ?Dental advisory given ? ?Plan Discussed with: CRNA ? ?Anesthesia Plan Comments: (See PAT note 02/15/2020, Konrad Felix, PA-C)  ? ? ? ? ?Anesthesia Quick Evaluation ?                                  Anesthesia Evaluation  ?Patient identified by MRN, date of birth, ID band ?Patient awake ? ? ? ?Reviewed: ?Allergy & Precautions, NPO status , Patient's Chart, lab work & pertinent test results ? ?History of Anesthesia Complications ?(+) PONV, DIFFICULT AIRWAY and history of anesthetic complications ? ?Airway ?Mallampati: III ? ?TM Distance: <3 FB ?Neck ROM: Limited ? ? ? Dental ? ?(+) Partial Upper, Dental  Advisory Given ?  ?Pulmonary ?asthma , sleep apnea ,  ?  ?Pulmonary exam normal ? ? ? ? ? ? ? Cardiovascular ?hypertension, Pt. on medications and Pt. on home beta blockers ?+CHF  ?+ dysrhythmias Atrial Fibrillation  ?Rhythm:Regular Rate:Normal ? ? ?  ?Neuro/Psych ?PSYCHIATRIC DISORDERS Depression   ? GI/Hepatic ?Neg liver ROS, hiatal hernia, GERD  ,  ?Endo/Other  ?negative endocrine ROS ? Renal/GU ?negative Renal ROS  ? ?  ?Musculoskeletal ? ?(+) Arthritis ,  ? Abdominal ?Normal abdominal exam  (+)   ?Peds ? Hematology ?negative hematology ROS ?(+)   ?Anesthesia Other Findings ? ? Reproductive/Obstetrics ? ?  ? ? ? ? ? ? ? ? ? ? ? ? ? ?  ?  ? ? ? ? ? ? ?Anesthesia Physical ?Anesthesia Plan ? ?ASA: III ? ?Anesthesia Plan: General  ? ?Post-op Pain Management:   ? ?Induction: Intravenous ? ?PONV Risk Score and Plan: 4 or greater and TIVA,  Ondansetron, Propofol infusion and Midazolam ? ?Airway Management Planned: Oral ETT and Video Laryngoscope Planned ? ?Additional Equipment: None ? ?Intra-op Plan:  ? ?Post-operative Plan: Extubation in OR ? ?Informed Consent: I have reviewed the patients History and Physical, chart, labs and discussed the procedure including the risks, benefits and alternatives for the proposed anesthesia with the patient or authorized representative who has indicated his/her understanding and acceptance.  ? ? ? ?Dental advisory given ? ?Plan Discussed with: CRNA ? ?Anesthesia Plan Comments: (See PAT note 02/15/2020, Konrad Felix, PA-C)  ? ? ? ? ?Anesthesia Quick Evaluation ? ?Anesthesia Physical ?Anesthesia Plan ? ?ASA: 3 ? ?Anesthesia Plan: General  ? ?Post-op Pain Management:   ? ?Induction: Intravenous ? ?PONV Risk Score and Plan: 4 or greater and Ondansetron, Dexamethasone, Midazolam and Scopolamine patch - Pre-op ? ?Airway Management Planned: Oral ETT and Video Laryngoscope Planned ? ?Additional Equipment: None ? ?Intra-op Plan:  ? ?Post-operative Plan: Extubation in OR ? ?Informed Consent:  ? ?Plan Discussed with: CRNA ? ?Anesthesia Plan Comments: (See PAT note 05/02/2021, Lyndon Code, PA-C)  ? ? ? ? ? ?Anesthesia Quick Evaluation                                  Anesthesia Evaluation  ?Patient identified by MRN, date of birth, ID band ?Patient awake ? ? ? ?Reviewed: ?Allergy & Precautions, NPO status , Patient's Chart, lab work & pertinent test results ? ?History of Anesthesia Complications ?(+) PONV, DIFFICULT AIRWAY and history of anesthetic complications ? ?Airway ?Mallampati: III ? ?TM Distance: <3 FB ?Neck ROM: Limited ? ? ? Dental ? ?(+) Partial Upper, Dental Advisory Given ?  ?Pulmonary ?asthma , sleep apnea ,  ?  ?Pulmonary exam normal ? ? ? ? ? ? ? Cardiovascular ?hypertension, Pt. on medications and Pt. on home beta blockers ?+CHF  ?+ dysrhythmias Atrial Fibrillation  ?Rhythm:Regular Rate:Normal ? ? ?   ?Neuro/Psych ?PSYCHIATRIC DISORDERS Depression   ? GI/Hepatic ?Neg liver ROS, hiatal hernia, GERD  ,  ?Endo/Other  ?negative endocrine ROS ? Renal/GU ?negative Renal ROS  ? ?  ?Musculoskeletal ? ?(+) Arthritis ,  ? Abdominal ?Normal abdominal exam  (+)   ?Peds ? Hematology ?negative hematology ROS ?(+)   ?Anesthesia Other Findings ? ? Reproductive/Obstetrics ? ?  ? ? ? ? ? ? ? ? ? ? ? ? ? ?  ?  ? ? ? ? ? ? ?Anesthesia Physical ?Anesthesia Plan ? ?  ASA: III ? ?Anesthesia Plan: General  ? ?Post-op Pain Management:   ? ?Induction: Intravenous ? ?PONV Risk Score and Plan: 4 or greater and TIVA, Ondansetron, Propofol infusion and Midazolam ? ?Airway Management Planned: Oral ETT and Video Laryngoscope Planned ? ?Additional Equipment: None ? ?Intra-op Plan:  ? ?Post-operative Plan: Extubation in OR ? ?Informed Consent: I have reviewed the patients History and Physical, chart, labs and discussed the procedure including the risks, benefits and alternatives for the proposed anesthesia with the patient or authorized representative who has indicated his/her understanding and acceptance.  ? ? ? ?Dental advisory given ? ?Plan Discussed with: CRNA ? ?Anesthesia Plan Comments: (See PAT note 02/15/2020, Konrad Felix, PA-C)  ? ? ? ? ?Anesthesia Quick Evaluation ?                                  Anesthesia Evaluation  ?Patient identified by MRN, date of birth, ID band ?Patient awake ? ? ? ?Reviewed: ?Allergy & Precautions, NPO status , Patient's Chart, lab work & pertinent test results ? ?History of Anesthesia Complications ?(+) PONV, DIFFICULT AIRWAY and history of anesthetic complications ? ?Airway ?Mallampati: III ? ?TM Distance: <3 FB ?Neck ROM: Limited ? ? ? Dental ? ?(+) Partial Upper, Dental Advisory Given ?  ?Pulmonary ?asthma , sleep apnea ,  ?  ?Pulmonary exam normal ? ? ? ? ? ? ? Cardiovascular ?hypertension, Pt. on medications and Pt. on home beta blockers ?+CHF  ?+ dysrhythmias Atrial Fibrillation  ?Rhythm:Regular  Rate:Normal ? ? ?  ?Neuro/Psych ?PSYCHIATRIC DISORDERS Depression   ? GI/Hepatic ?Neg liver ROS, hiatal hernia, GERD  ,  ?Endo/Other  ?negative endocrine ROS ? Renal/GU ?negative Renal ROS  ? ?  ?Musculoskeletal ? ?(+) Arth

## 2021-05-03 NOTE — Progress Notes (Addendum)
Anesthesia Chart Review ? ? Case: [id number] Date/Time: 05/14/21 0825  ? Procedure: TOTAL HIP ARTHROPLASTY ANTERIOR APPROACH (Right: Hip)  ? Anesthesia type: Spinal  ? Pre-op diagnosis: Right hip osteoarthritis  ? Location: [facility] ROOM 10 / [facility]  ? Surgeons: Paralee Cancel, MD  ? ?  ? ? ?DISCUSSION:66 y.o. never smoker with h/o PONV, HTN, CHF, PAF (on Eliquis), OSA, right hip OA scheduled for above procedure 05/14/2021 with Dr. Paralee Cancel.  ? ?Pt last seen by cardiology 04/10/2021. Per OV note, "Preoperative cardiovascular examination ?Ms. Ybarbo's perioperative risk of a major cardiac event is 0.4% according to the Revised Cardiac Risk Index (RCRI).  Therefore, she is at low risk for perioperative complications.   Her functional capacity is fair at 4.4 METs according to the Duke Activity Status Index (DASI). ?Recommendations: ?According to ACC/AHA guidelines, no further cardiovascular testing needed.  The patient may proceed to surgery at acceptable risk.   ?Antiplatelet and/or Anticoagulation Recommendations: ?[name] from her PCP to obtain recent Plt count and then review duration to hold Eliquis. ?Addendum: Hgb, Plt count, Creatinine stable.  Reviewed with PharmD.  Patient can hold Eliquis for 3 days prior to hip surgery and resume as soon as possible post op." ? ?H/o difficulty intubation in 2006 with multiple uneventful glidescope intubations since.  Per 09/27/14 airway note, "Future Recommendations: Recommend- induction with short-acting agent, and alternative techniques readily available ?Comments: Glidescope electively used due to previous difficult intubation noted. Intubation uneventful with Glidescope." ? ?S/p total hip 02/21/2020 with no anesthesia complications noted.  ? ?Anticipate pt can proceed with planned procedure barring acute status change.   ?VS: BP (!) 151/85   Pulse 64   Temp 36.6 ?C (Oral)   Resp 16   Ht _0  (1.651 m)   Wt 97.5 kg   SpO2 99%   BMI 35.78 kg/m?   ? ?PROVIDERS: ?London Pepper, MD is PCP  ? ?Cardiologist - Mertie Moores, MD ? ?LABS: Labs reviewed: Acceptable for surgery. ?(all labs ordered are listed, but only abnormal results are displayed) ? ?Labs Reviewed  ?SURGICAL PCR SCREEN  ?HEMOGLOBIN A1C  ?COMPREHENSIVE METABOLIC PANEL  ?CBC  ?TYPE AND SCREEN  ? ? ? ?IMAGES: ? ? ?EKG: ?04/10/2021 ?Rate 67 bpm  ?NSR ? ?CV: ?Myocardial Perfusion 04/06/2015 ?Nuclear stress EF: 61%. ?There was no ST segment deviation noted during stress. ?The study is normal. ?This is a low risk study. ?The left ventricular ejection fraction is normal (55-65%). ?  ?Normal pharmacologic nuclear stress test with no evidence of prior infarct or ischemia.  ? ?Echo 04/06/2015 ?- Left ventricle: The cavity size was normal. Systolic function was  ?  normal. The estimated ejection fraction was in the range of 55%  ?  to 60%. Wall motion was normal; there were no regional wall  ?  motion abnormalities. Features are consistent with a pseudonormal  ?  left ventricular filling pattern, with concomitant abnormal  ?  relaxation and increased filling pressure (grade 2 diastolic  ?  dysfunction).  ?- Aortic valve: Trileaflet; normal thickness, mildly calcified  ?  leaflets.  ?- Mitral valve: There was trivial regurgitation.  ?- Tricuspid valve: There was trivial regurgitation. ?Past Medical History:  ?Diagnosis Date  ? Allergic rhinitis 04/03/2009  ? Anemia   ? hx  ? Arthritis   ? Asthma, mild, intermittent 01/23/2009  ? Qualifier: Diagnosis of  By: Jerold Coombe - denies  ? Back pain 08/03/2012  ? 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 Chronic diastolic CHF (congestive heart failure) ([place]) 07/20/2015  ? Depression, recurrent ([place]) 03/03/2007  ? Qualifier: Diagnosis of  By: Nils Pyle CMA (AAMA), Mearl Latin  -denies  ? Difficult intubation   ? 04/09/04 with difficult intubation. unabale to pass ETT with Sabra Heck 2; unable to visualize cord with MAC 3; intubating LMA used; uneventful intubation using  Glidescope 09/27/14  ? Diverticulosis, with history of diverticulitis of descending and sigmoid colon 03/03/2007  ? Dysrhythmia   ? atrial fib  ? Edema of both lower legs   ? Essential hypertension 06/25/2006  ? Qualifier: Diagnosis of  By: Tiney Rouge CMA, Ellison Hughs    ? Fatty liver   ? Gastritis   ? GERD 03/03/2007  ? Qualifier: Diagnosis of  By: Nils Pyle CMA (AAMA), Mearl Latin   Qualifier: Diagnosis of  By: Shane Crutch, [name]  pt denies at preop of 02/15/20   ? Hepatic steatosis, found on CT 03/03/2007  ?    ? Hiatal hernia, small 03/28/2016  ? [facility]09[facility]12.   ? Hip pain   ? History of colonic polyps 04/21/2008  ? Last Colonoscopy was 12/09/12, Dr. Fuller Plan. No polyp at that time. Repeat in 5 years.   ? Internal hemorrhoids 03/03/2007  ? Last Colonoscopy was 12/09/12, Dr. Fuller Plan. Repeat in 5 years.   ? Joint pain   ? Lactose intolerance   ? Left adrenal mass ([place]) 05/12/2013  ? Menopausal vasomotor syndrome 03/28/2016  ? Morbid obesity ([place]) (BMI 35 plus 2 comorbidities) 03/06/2016  ? Comorbid conditions include HTN, HLD, hepatic steatosis, OA, GERD, OSA.  ? OSA (obstructive sleep apnea) 03/03/2007  ? cpap   ? PAF (paroxysmal atrial fibrillation) ([place]) 04/24/2015  ? Palpitations   ? PONV (postoperative nausea and vomiting)   ? area on left forearm DO NOT PLACE IV OR STICK ? vascular"will bleed"  ? Umbilical hernia without obstruction or gangrene, fat containing 03/28/2016  ? Found on CT, 2017. No issues.  ? ? ?Past Surgical History:  ?Procedure Laterality Date  ? ABDOMINAL HYSTERECTOMY    ? APPENDECTOMY    ? BREAST REDUCTION SURGERY    ? BUNIONECTOMY    ? bilateral  ? CERVICAL FUSION    ? CHOLECYSTECTOMY N/A 10/01/2016  ? Procedure: LAPAROSCOPIC CHOLECYSTECTOMY;  Surgeon: Coralie Keens, MD;  Location: [place];  Service: General;  Laterality: N/A;  ? COLONOSCOPY WITH PROPOFOL N/A 05/12/2019  ? Procedure: COLONOSCOPY WITH PROPOFOL;  Surgeon: Juanita Craver, MD;  [facility];  Service: Endoscopy;  Laterality: N/A;  ? MAXIMUM ACCESS  (MAS)POSTERIOR LUMBAR INTERBODY FUSION (PLIF) 2 LEVEL N/A 09/27/2014  ? Procedure: Maximum Access Surgery Posterior Lumbar Interbody Fusion Lumbar three-four, Lumbar four-five;  Surgeon: Eustace Moore, MD;  Location: [place] [facility];  Service: Neurosurgery;  Laterality: N/A;  ? POLYPECTOMY  05/12/2019  ? Procedure: POLYPECTOMY;  Surgeon: Juanita Craver, MD;  [facility];  Service: Endoscopy;;  ? SPINE SURGERY    ? TOTAL ABDOMINAL HYSTERECTOMY W/ BILATERAL SALPINGOOPHORECTOMY    ? TOTAL HIP ARTHROPLASTY Left 02/21/2020  ? Procedure: TOTAL HIP ARTHROPLASTY ANTERIOR APPROACH;  Surgeon: Paralee Cancel, MD;  [facility];  Service: Orthopedics;  Laterality: Left;  70 mins  ? ? ?MEDICATIONS: ? methocarbamol (ROBAXIN) 500 MG tablet  ? traMADol (ULTRAM) 50 MG tablet  ? albuterol (VENTOLIN HFA) 108 (90 Base) MCG/ACT inhaler  ? amLODipine (NORVASC) 5 MG tablet  ? apixaban (ELIQUIS) 5 MG TABS tablet  ? Calcium Carb-Cholecalciferol (CALCIUM 600+D3 PO)  ? ELDERBERRY  PO  ? metFORMIN (GLUCOPHAGE) 500 MG tablet  ? metoprolol succinate (TOPROL XL) 25 MG 24 hr tablet  ? Multiple Vitamin (MULTIVITAMIN WITH MINERALS) TABS tablet  ? Probiotic Product ([place])  ? propranolol (INDERAL) 10 MG tablet  ? TURMERIC PO  ? ?No current facility-administered medications for this encounter.  ? ? ? ?Konrad Felix Ward, PA-C ?[facility] ?[phone] ? ? ? ? ? ?

## 2021-05-05 ENCOUNTER — Emergency Department (HOSPITAL_COMMUNITY)
Admission: EM | Admit: 2021-05-05 | Discharge: 2021-05-05 | Disposition: A | Discharge status: Home / Self Care | Attending: Emergency Medicine | Admitting: Emergency Medicine

## 2021-05-05 ENCOUNTER — Other Ambulatory Visit: Payer: Self-pay

## 2021-05-05 ENCOUNTER — Encounter (HOSPITAL_COMMUNITY): Payer: Self-pay

## 2021-05-05 ENCOUNTER — Emergency Department (HOSPITAL_COMMUNITY)

## 2021-05-05 DIAGNOSIS — Z79899 Other long term (current) drug therapy: Secondary | ICD-10-CM | POA: Insufficient documentation

## 2021-05-05 DIAGNOSIS — R109 Unspecified abdominal pain: Secondary | ICD-10-CM

## 2021-05-05 DIAGNOSIS — I110 Hypertensive heart disease with heart failure: Secondary | ICD-10-CM | POA: Insufficient documentation

## 2021-05-05 DIAGNOSIS — J45909 Unspecified asthma, uncomplicated: Secondary | ICD-10-CM | POA: Diagnosis not present

## 2021-05-05 DIAGNOSIS — K5730 Diverticulosis of large intestine without perforation or abscess without bleeding: Secondary | ICD-10-CM | POA: Insufficient documentation

## 2021-05-05 DIAGNOSIS — Z7901 Long term (current) use of anticoagulants: Secondary | ICD-10-CM | POA: Diagnosis not present

## 2021-05-05 DIAGNOSIS — Z96642 Presence of left artificial hip joint: Secondary | ICD-10-CM | POA: Insufficient documentation

## 2021-05-05 DIAGNOSIS — I5032 Chronic diastolic (congestive) heart failure: Secondary | ICD-10-CM | POA: Insufficient documentation

## 2021-05-05 LAB — CBC WITH DIFFERENTIAL/PLATELET
Abs Immature Granulocytes: 0.01 10*3/uL (ref 0.00–0.07)
Basophils Absolute: 0 10*3/uL (ref 0.0–0.1)
Basophils Relative: 1 %
Eosinophils Absolute: 0.2 10*3/uL (ref 0.0–0.5)
Eosinophils Relative: 3 %
HCT: 43.7 % (ref 36.0–46.0)
Hemoglobin: 14.5 g/dL (ref 12.0–15.0)
Immature Granulocytes: 0 %
Lymphocytes Relative: 50 %
Lymphs Abs: 2.5 10*3/uL (ref 0.7–4.0)
MCH: 32.5 pg (ref 26.0–34.0)
MCHC: 33.2 g/dL (ref 30.0–36.0)
MCV: 98 fL (ref 80.0–100.0)
Monocytes Absolute: 0.6 10*3/uL (ref 0.1–1.0)
Monocytes Relative: 12 %
Neutro Abs: 1.7 10*3/uL (ref 1.7–7.7)
Neutrophils Relative %: 34 %
Platelets: 359 10*3/uL (ref 150–400)
RBC: 4.46 MIL/uL (ref 3.87–5.11)
RDW: 12.6 % (ref 11.5–15.5)
WBC: 5 10*3/uL (ref 4.0–10.5)
nRBC: 0 % (ref 0.0–0.2)

## 2021-05-05 LAB — COMPREHENSIVE METABOLIC PANEL
ALT: 20 U/L (ref 0–44)
AST: 19 U/L (ref 15–41)
Albumin: 4 g/dL (ref 3.5–5.0)
Alkaline Phosphatase: 64 U/L (ref 38–126)
Anion gap: 6 (ref 5–15)
BUN: 9 mg/dL (ref 8–23)
CO2: 25 mmol/L (ref 22–32)
Calcium: 9.4 mg/dL (ref 8.9–10.3)
Chloride: 109 mmol/L (ref 98–111)
Creatinine, Ser: 0.7 mg/dL (ref 0.44–1.00)
GFR, Estimated: >60 mL/min (ref >60)
Glucose, Bld: 107 mg/dL — ABNORMAL HIGH (ref 70–99)
Potassium: 3.8 mmol/L (ref 3.5–5.1)
Sodium: 140 mmol/L (ref 135–145)
Total Bilirubin: 0.7 mg/dL (ref 0.3–1.2)
Total Protein: 8.1 g/dL (ref 6.5–8.1)

## 2021-05-05 LAB — URINALYSIS, ROUTINE W REFLEX MICROSCOPIC
Bilirubin Urine: NEGATIVE
Glucose, UA: NEGATIVE mg/dL
Hgb urine dipstick: NEGATIVE
Ketones, ur: NEGATIVE mg/dL
Leukocytes,Ua: NEGATIVE
Nitrite: NEGATIVE
Protein, ur: NEGATIVE mg/dL
Specific Gravity, Urine: 1.01 (ref 1.005–1.030)
pH: 7 (ref 5.0–8.0)

## 2021-05-05 LAB — LIPASE, BLOOD: Lipase: 32 U/L (ref 11–51)

## 2021-05-05 MED ORDER — MORPHINE SULFATE (PF) 4 MG/ML IV SOLN
4.0000 mg | Freq: Once | INTRAVENOUS | Status: AC
Start: 2021-05-05 — End: 2021-05-05
  Administered 2021-05-05: 4 mg via INTRAVENOUS
  Filled 2021-05-05: qty 1

## 2021-05-05 MED ORDER — DICYCLOMINE HCL 20 MG PO TABS: 20.0000 mg | ORAL_TABLET | Freq: Two times a day (BID) | ORAL | 0 refills | Status: DC

## 2021-05-05 MED ORDER — LIDOCAINE VISCOUS HCL 2 % MT SOLN
15.0000 mL | Freq: Once | OROMUCOSAL | Status: AC
Start: 2021-05-05 — End: 2021-05-05
  Administered 2021-05-05: 15 mL via ORAL
  Filled 2021-05-05: qty 15

## 2021-05-05 MED ORDER — KETOROLAC TROMETHAMINE 15 MG/ML IJ SOLN
15.0000 mg | Freq: Once | INTRAMUSCULAR | Status: AC
  Administered 2021-05-05: 15 mg via INTRAVENOUS
  Filled 2021-05-05: qty 1

## 2021-05-05 MED ORDER — FAMOTIDINE IN NACL 20-0.9 MG/50ML-% IV SOLN
20.0000 mg | Freq: Once | INTRAVENOUS | Status: AC
Start: 2021-05-05 — End: 2021-05-05
  Administered 2021-05-05: 20 mg via INTRAVENOUS
  Filled 2021-05-05: qty 50

## 2021-05-05 MED ORDER — SUCRALFATE 1 GM/10ML PO SUSP: 1.0000 g | Freq: Three times a day (TID) | ORAL | 0 refills | Status: DC

## 2021-05-05 MED ORDER — LIDOCAINE 5 % EX PTCH: 1.0000 | MEDICATED_PATCH | Freq: Every day | CUTANEOUS | 0 refills | Status: DC | PRN

## 2021-05-05 MED ORDER — SODIUM CHLORIDE (PF) 0.9 % IJ SOLN
INTRAMUSCULAR | Status: AC
  Filled 2021-05-05: qty 50

## 2021-05-05 MED ORDER — SODIUM CHLORIDE 0.9 % IV BOLUS
1000.0000 mL | Freq: Once | INTRAVENOUS | Status: AC
  Administered 2021-05-05: 1000 mL via INTRAVENOUS

## 2021-05-05 MED ORDER — ALUM & MAG HYDROXIDE-SIMETH 200-200-20 MG/5ML PO SUSP
30.0000 mL | Freq: Once | ORAL | Status: AC
Start: 2021-05-05 — End: 2021-05-05
  Administered 2021-05-05: 30 mL via ORAL
  Filled 2021-05-05: qty 30

## 2021-05-05 MED ORDER — LIDOCAINE 5 % EX PTCH
1.0000 | MEDICATED_PATCH | CUTANEOUS | Status: DC
  Administered 2021-05-05: 1 via TRANSDERMAL
  Filled 2021-05-05: qty 1

## 2021-05-05 MED ORDER — IOHEXOL 300 MG/ML  SOLN
100.0000 mL | Freq: Once | INTRAMUSCULAR | Status: AC | PRN
  Administered 2021-05-05: 100 mL via INTRAVENOUS

## 2021-05-05 NOTE — Discharge Instructions (Addendum)
The etiology of your abdominal pain is unclear.  ?  ?]There are many causes of abdominal pain. Most pain is not serious and goes away, but some pain gets worse, changes, or will not go away. Please return to the emergency department or see your doctor right away if you (or your family member) experience any of the following:  ?1. Pain that gets worse or moves to just one spot.  ?2. Pain that gets worse if you cough or sneeze.  ?3. Pain with going over a bump in the road.  ?4. Pain that does not get better in 24 hours.  ?5. Inability to keep down liquids (vomiting)-especially if you are making less urine.  ?6. Fainting.  ?7. Blood in the vomit or stool.  ?8. High fever or shaking chills.  ?9. Swelling of the abdomen.  ?10. Any new or worsening problem.  ?  ?  ?Follow-up Instructions  ?See your primary care provider if not completely better in the next 2-3 days. Come to the ED if you are unable to see them in this time frame.  ?  ?Additional Instructions  ?No alcohol.  ?No caffeine, aspirin, or cigarettes. ?  ?Please return to the emergency department immediately for any new or concerning symptoms, or if you get worse. ? ?

## 2021-05-05 NOTE — ED Notes (Signed)
Pt states she did not take her bp meds this morning, will take when home and will continue to monitor bp. Usual readings in the 120s. ?

## 2021-05-05 NOTE — ED Notes (Signed)
Pt ambulatory to restroom with visitor standby  ?

## 2021-05-05 NOTE — ED Triage Notes (Signed)
Patient has had abdominal pain for 2 days. Says its diverticulitis flare up.  ?

## 2021-05-05 NOTE — ED Provider Notes (Signed)
?[facility] [facility] DEPT ?Provider Note ? ? ?CSN: [id number] ?Arrival date & time: 05/05/21  0737 ? ?  ? ?History ? ?Chief Complaint  ?Patient presents with  ? Abdominal Pain  ? ? ?[name] is a 66 y.o. female. ? ? Patient as above with significant medical history as below, including appendectomy, diverticulosis, diastolic CHF (grade 1) who presents to the ED with complaint of left flank pain.  ? ?Onset yesterday morning, sharp stabbing, left flank with radiation to left groin. No change to bowel/bladder fxn, she has some mild nausea but no emesis. Poor appetite last 24 hours. She is due for hip surgery coming up and has been taking tramadol/robaxin to help with the discomfort. No falls, no recent travel or sick contacts. Feels like a "diverticulitis flare up" per the pt.  ? ?Past Medical History: ?04/03/2009: Allergic rhinitis ?No date: Anemia ?    Comment:  hx ?No date: Arthritis ?01/23/2009: Asthma, mild, intermittent ?    Comment:  Qualifier: Diagnosis of  By: Jerold Coombe - denies ?08/03/2012: Back pain ?06/23/2013: Central centrifugal scarring alopecia ?01/12/2692: Chronic diastolic CHF (congestive heart failure) ([place]) ?03/03/2007: Depression, recurrent ([place]) ?    Comment:  Qualifier: Diagnosis of  By: Nils Pyle CMA (AAMA), Mearl Latin   ?             -denies ?No date: Difficult intubation ?    Comment:  04/09/04 with difficult intubation. unabale to pass ETT  ?             with Sabra Heck 2; unable to visualize cord with MAC 3;  ?             intubating LMA used; uneventful intubation using  ?             Glidescope 09/27/14 ?03/03/2007: Diverticulosis, with history of diverticulitis of  ?descending and sigmoid colon ?No date: Dysrhythmia ?    Comment:  atrial fib ?No date: Edema of both lower legs ?06/25/2006: Essential hypertension ?    Comment:  Qualifier: Diagnosis of  By: Tiney Rouge CMA, Ellison Hughs   ?No date: Fatty liver ?No date: Gastritis ?03/03/2007: GERD ?    Comment:  Qualifier: Diagnosis of  By:  Nils Pyle CMA Deborra Medina), Mearl Latin   ?             Qualifier: Diagnosis of  By: Shane Crutch, [name]  pt  ?             denies at preop of 02/15/20  ?03/03/2007: Hepatic steatosis, found on CT ?    Comment:    ?03/28/2016: Hiatal hernia, small ?    Comment:  EGD by [facility] 12/09/12.  ?No date: Hip pain ?04/21/2008: History of colonic polyps ?    Comment:  Last Colonoscopy was 12/09/12, Dr. Fuller Plan. No polyp at  ?             that time. Repeat in 5 years.  ?03/03/2007: Internal hemorrhoids ?    Comment:  Last Colonoscopy was 12/09/12, Dr. Fuller Plan. Repeat in 5  ?             years.  ?No date: Joint pain ?No date: Lactose intolerance ?05/12/2013: Left adrenal mass ([place]) ?03/28/2016: Menopausal vasomotor syndrome ?03/06/2016: Morbid obesity ([facility]) (BMI 35 plus 2 comorbidities) ?    Comment:  Comorbid conditions include HTN, HLD, hepatic steatosis, ?             OA, GERD, OSA. ?03/03/2007: OSA (obstructive sleep apnea) ?  Comment:  cpap  ?04/24/2015: PAF (paroxysmal atrial fibrillation) ([place]) ?No date: Palpitations ?No date: PONV (postoperative nausea and vomiting) ?    Comment:  area on left forearm DO NOT PLACE IV OR STICK ?  ?             vascular"will bleed" ?2/59/5638: Umbilical hernia without obstruction or gangrene, fat  ?containing ?    Comment:  Found on CT, 2017. No issues. ? ?Past Surgical History: ?No date: ABDOMINAL HYSTERECTOMY ?No date: APPENDECTOMY ?No date: BREAST REDUCTION SURGERY ?No date: BUNIONECTOMY ?    Comment:  bilateral ?No date: CERVICAL FUSION ?10/01/2016: CHOLECYSTECTOMY; N/A ?    Comment:  Procedure: LAPAROSCOPIC CHOLECYSTECTOMY;  Surgeon:  ?             Coralie Keens, MD;  Location: [place];  Service:  ?             General;  Laterality: N/A; ?05/12/2019: COLONOSCOPY WITH PROPOFOL; N/A ?    Comment:  Procedure: COLONOSCOPY WITH PROPOFOL;  Surgeon: Collene Mares,  ?             Mar Daring, MD;  [facility];  Service: Endoscopy; ?             Laterality: N/A; ?09/27/2014: MAXIMUM ACCESS (MAS)POSTERIOR LUMBAR INTERBODY FUSION   ?(PLIF) 2 LEVEL; N/A ?    Comment:  Procedure: Maximum Access Surgery Posterior Lumbar  ?             Interbody Fusion Lumbar three-four, Lumbar four-five;   ?             Surgeon: Eustace Moore, MD;  Location: [place] [facility];   ?             Service: Neurosurgery;  Laterality: N/A; ?05/12/2019: POLYPECTOMY ?    Comment:  Procedure: POLYPECTOMY;  Surgeon: Juanita Craver, MD;   ?             [facility];  Service: Endoscopy;; ?No date: SPINE SURGERY ?No date: TOTAL ABDOMINAL HYSTERECTOMY W/ BILATERAL SALPINGOOPHORECTOMY ?02/21/2020: TOTAL HIP ARTHROPLASTY; Left ?    Comment:  Procedure: TOTAL HIP ARTHROPLASTY ANTERIOR APPROACH;   ?             Surgeon: Paralee Cancel, MD;  [facility];  Service:  ?             Orthopedics;  Laterality: Left;  70 mins  ? ? ?The history is provided by the patient. No language interpreter was used.  ?Abdominal Pain ?Associated symptoms: nausea   ?Associated symptoms: no chest pain, no chills, no cough, no fever, no hematuria, no shortness of breath and no vomiting   ? ?  ? ?Home Medications ?Prior to Admission medications   ?Medication Sig Start Date End Date Taking? Authorizing Provider  ?dicyclomine (BENTYL) 20 MG tablet Take 1 tablet (20 mg total) by mouth 2 (two) times daily. 05/05/21  Yes Wynona Dove A, DO  ?lidocaine (LIDODERM) 5 % Place 1 patch onto the skin daily as needed. Remove & Discard patch within 12 hours or as directed by MD 05/05/21  Yes Jeanell Sparrow, DO  ?sucralfate (CARAFATE) 1 GM/10ML suspension Take 10 mLs (1 g total) by mouth 4 (four) times daily -  with meals and at bedtime. 05/05/21  Yes Wynona Dove A, DO  ?albuterol (VENTOLIN HFA) 108 (90 Base) MCG/ACT inhaler Inhale 2 puffs into the lungs every 6 (six) hours as needed for wheezing or shortness of breath.  02/07/21   Nahser, Wonda Cheng, MD  ?amLODipine (NORVASC) 5 MG tablet Take 1 tablet (5 mg total) by mouth daily. 02/22/21   Nahser, Wonda Cheng, MD  ?apixaban (ELIQUIS) 5 MG TABS tablet Take 1 tablet (5 mg  total) by mouth 2 (two) times daily. 02/18/21   Nahser, Wonda Cheng, MD  ?Calcium Carb-Cholecalciferol (CALCIUM 600+D3 PO) Take 1 tablet by mouth in the morning.    [provider]  ?ELDERBERRY PO Take 2 tablets by mouth in the morning.    [provider]  ?metFORMIN (GLUCOPHAGE) 500 MG tablet TAKE 1 TABLET BY MOUTH EVERY DAY WITH BREAKFAST ?Patient not taking: Reported on 04/26/2021 02/05/21   Dennard Nip D, MD  ?methocarbamol (ROBAXIN) 500 MG tablet Take 500 mg by mouth every 8 (eight) hours as needed for muscle spasms. 0.5 tab    [provider]  ?metoprolol succinate (TOPROL XL) 25 MG 24 hr tablet Take 1 tablet (25 mg total) by mouth 2 (two) times daily. 02/22/21   Nahser, Wonda Cheng, MD  ?Multiple Vitamin (MULTIVITAMIN WITH MINERALS) TABS tablet Take 1 tablet by mouth in the morning.    [provider]  ?Probiotic Product ([place] PO) Take 1 tablet by mouth daily.    [provider]  ?propranolol (INDERAL) 10 MG tablet Take 1 tablet (10 mg total) by mouth 4 (four) times daily as needed. 11/10/18   Nahser, Wonda Cheng, MD  ?traMADol Veatrice Bourbon) 50 MG tablet Take by mouth every 6 (six) hours as needed. 0.5 tab    [provider]  ?TURMERIC PO Take 2 capsules by mouth in the morning.    [provider]  ?   ? ?Allergies    ?Other, Losartan, and Tramadol   ? ?Review of Systems   ?Review of Systems  ?Constitutional:  Positive for appetite change. Negative for chills and fever.  ?HENT:  Negative for facial swelling and trouble swallowing.   ?Eyes:  Negative for photophobia and visual disturbance.  ?Respiratory:  Negative for cough and shortness of breath.   ?Cardiovascular:  Negative for chest pain and palpitations.  ?Gastrointestinal:  Positive for abdominal pain and nausea. Negative for vomiting.  ?Endocrine: Negative for polydipsia and polyuria.  ?Genitourinary:  Negative for difficulty urinating and hematuria.  ?Musculoskeletal:  Negative for gait  problem and joint swelling.  ?Skin:  Negative for pallor and rash.  ?Neurological:  Negative for syncope and headaches.  ?Psychiatric/Behavioral:  Negative for agitation and confusion.   ? ?Physical E

## 2021-05-05 NOTE — ED Notes (Signed)
Patient transported to CT 

## 2021-05-13 NOTE — H&P (Signed)
TOTAL HIP ADMISSION H&P ? ?Patient is admitted for right total hip arthroplasty. ? ?Subjective: ? ?Chief Complaint: right hip pain ? ?HPI: MANREET KIERNAN, 66 y.o. female, has a history of pain and functional disability in the right hip(s) due to arthritis and patient has failed non-surgical conservative treatments for greater than 12 weeks to include NSAID's and/or analgesics and activity modification.  Onset of symptoms was gradual starting 2 years ago with gradually worsening course since that time.The patient noted no past surgery on the right hip(s).  Patient currently rates pain in the right hip at 8 out of 10 with activity. Patient has worsening of pain with activity and weight bearing. Patient has evidence of joint space narrowing by imaging studies. This condition presents safety issues increasing the risk of falls.  There is no current active infection. ? ?Patient Active Problem List  ? Diagnosis Date Noted  ? Preoperative cardiovascular examination 04/10/2021  ? Class 2 obesity due to excess calories with body mass index (BMI) of 37.0 to 37.9 in adult 10/24/2020  ? Prediabetes 10/23/2020  ? NAFLD (nonalcoholic fatty liver disease) 10/23/2020  ? Osteoarthritis of left hip 02/21/2020  ? Status post left hip replacement 02/21/2020  ? Biliary dyskinesia 10/01/2016  ? History of hysterectomy 03/28/2016  ? History of appendectomy 03/28/2016  ? Umbilical hernia without obstruction or gangrene, fat containing 03/28/2016  ? History of fusion of cervical spine 03/28/2016  ? Hiatal hernia, small 03/28/2016  ? Menopausal vasomotor syndrome 03/28/2016  ? Forearm mass, left 03/28/2016  ? Morbid obesity ([place]) (BMI 35 plus 2 comorbidities) 03/06/2016  ? Chronic heart failure with preserved ejection fraction ([place]) 07/20/2015  ? PAF (paroxysmal atrial fibrillation) ([place]) 04/24/2015  ? History of lumbar spinal fusion 09/27/2014  ? 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 Left adrenal mass ([place]) 05/12/2013  ?  Back pain 08/03/2012  ? Allergic rhinitis 04/03/2009  ? Asthma, mild, intermittent 01/23/2009  ? History of colonic polyps 04/21/2008  ? Depression, recurrent ([place]) 03/03/2007  ? Internal hemorrhoids 03/03/2007  ? GERD 03/03/2007  ? Diverticulosis, with history of diverticulitis of descending and sigmoid colon 03/03/2007  ? Hepatic steatosis, found on CT 03/03/2007  ? OSA (obstructive sleep apnea) 03/03/2007  ? Essential hypertension 06/25/2006  ? ?Past Medical History:  ?Diagnosis Date  ? Allergic rhinitis 04/03/2009  ? Anemia   ? hx  ? Arthritis   ? Asthma, mild, intermittent 01/23/2009  ? Qualifier: Diagnosis of  By: Jerold Coombe - denies  ? Back pain 08/03/2012  ? 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 Chronic diastolic CHF (congestive heart failure) ([place]) 07/20/2015  ? Depression, recurrent ([place]) 03/03/2007  ? Qualifier: Diagnosis of  By: Nils Pyle CMA (AAMA), Mearl Latin  -denies  ? Difficult intubation   ? 04/09/04 with difficult intubation. unabale to pass ETT with Sabra Heck 2; unable to visualize cord with MAC 3; intubating LMA used; uneventful intubation using Glidescope 09/27/14  ? Diverticulosis, with history of diverticulitis of descending and sigmoid colon 03/03/2007  ? Dysrhythmia   ? atrial fib  ? Edema of both lower legs   ? Essential hypertension 06/25/2006  ? Qualifier: Diagnosis of  By: Tiney Rouge CMA, Ellison Hughs    ? Fatty liver   ? Gastritis   ? GERD 03/03/2007  ? Qualifier: Diagnosis of  By: Nils Pyle CMA (AAMA), Mearl Latin   Qualifier: Diagnosis of  By: Shane Crutch, [name]  pt denies at preop of 02/15/20   ? Hepatic steatosis, found on CT 03/03/2007  ?    ?  Hiatal hernia, small 03/28/2016  ? EGD by [facility] 12/09/12.   ? Hip pain   ? History of colonic polyps 04/21/2008  ? Last Colonoscopy was 12/09/12, Dr. Fuller Plan. No polyp at that time. Repeat in 5 years.   ? Internal hemorrhoids 03/03/2007  ? Last Colonoscopy was 12/09/12, Dr. Fuller Plan. Repeat in 5 years.   ? Joint pain   ? Lactose intolerance   ? Left adrenal mass ([place])  05/12/2013  ? Menopausal vasomotor syndrome 03/28/2016  ? Morbid obesity ([place]) (BMI 35 plus 2 comorbidities) 03/06/2016  ? Comorbid conditions include HTN, HLD, hepatic steatosis, OA, GERD, OSA.  ? OSA (obstructive sleep apnea) 03/03/2007  ? cpap   ? PAF (paroxysmal atrial fibrillation) ([place]) 04/24/2015  ? Palpitations   ? PONV (postoperative nausea and vomiting)   ? area on left forearm DO NOT PLACE IV OR STICK ? vascular"will bleed"  ? Umbilical hernia without obstruction or gangrene, fat containing 03/28/2016  ? Found on CT, 2017. No issues.  ?  ?Past Surgical History:  ?Procedure Laterality Date  ? ABDOMINAL HYSTERECTOMY    ? APPENDECTOMY    ? BREAST REDUCTION SURGERY    ? BUNIONECTOMY    ? bilateral  ? CERVICAL FUSION    ? CHOLECYSTECTOMY N/A 10/01/2016  ? Procedure: LAPAROSCOPIC CHOLECYSTECTOMY;  Surgeon: Coralie Keens, MD;  Location: [facility];  Service: General;  Laterality: N/A;  ? COLONOSCOPY WITH PROPOFOL N/A 05/12/2019  ? Procedure: COLONOSCOPY WITH PROPOFOL;  Surgeon: Juanita Craver, MD;  Location: [facility];  Service: Endoscopy;  Laterality: N/A;  ? MAXIMUM ACCESS (MAS)POSTERIOR LUMBAR INTERBODY FUSION (PLIF) 2 LEVEL N/A 09/27/2014  ? Procedure: Maximum Access Surgery Posterior Lumbar Interbody Fusion Lumbar three-four, Lumbar four-five;  Surgeon: Eustace Moore, MD;  Location: [facility];  Service: Neurosurgery;  Laterality: N/A;  ? POLYPECTOMY  05/12/2019  ? Procedure: POLYPECTOMY;  Surgeon: Juanita Craver, MD;  Location: [facility];  Service: Endoscopy;;  ? SPINE SURGERY    ? TOTAL ABDOMINAL HYSTERECTOMY W/ BILATERAL SALPINGOOPHORECTOMY    ? TOTAL HIP ARTHROPLASTY Left 02/21/2020  ? Procedure: TOTAL HIP ARTHROPLASTY ANTERIOR APPROACH;  Surgeon: Paralee Cancel, MD;  Location: [facility];  Service: Orthopedics;  Laterality: Left;  70 mins  ?  ?No current facility-administered medications for this encounter.  ? ?Current Outpatient Medications  ?Medication Sig Dispense Refill Last Dose  ? albuterol (VENTOLIN HFA) 108 (90  Base) MCG/ACT inhaler Inhale 2 puffs into the lungs every 6 (six) hours as needed for wheezing or shortness of breath. 8 g 2   ? amLODipine (NORVASC) 5 MG tablet Take 1 tablet (5 mg total) by mouth daily. 90 tablet 3   ? apixaban (ELIQUIS) 5 MG TABS tablet Take 1 tablet (5 mg total) by mouth 2 (two) times daily. 180 tablet 3   ? Calcium Carb-Cholecalciferol (CALCIUM 600+D3 PO) Take 1 tablet by mouth in the morning.     ? ELDERBERRY PO Take 2 tablets by mouth in the morning.     ? metoprolol succinate (TOPROL XL) 25 MG 24 hr tablet Take 1 tablet (25 mg total) by mouth 2 (two) times daily. 180 tablet 3   ? Multiple Vitamin (MULTIVITAMIN WITH MINERALS) TABS tablet Take 1 tablet by mouth in the morning.     ? Probiotic Product (ULTRAFLORA IMMUNE HEALTH PO) Take 1 tablet by mouth daily.     ? propranolol (INDERAL) 10 MG tablet Take 1 tablet (10 mg total) by mouth 4 (four) times daily as needed. 360 tablet 3   ?  TURMERIC PO Take 2 capsules by mouth in the morning.     ? dicyclomine (BENTYL) 20 MG tablet Take 1 tablet (20 mg total) by mouth 2 (two) times daily. 20 tablet 0   ? lidocaine (LIDODERM) 5 % Place 1 patch onto the skin daily as needed. Remove & Discard patch within 12 hours or as directed by MD 15 patch 0   ? metFORMIN (GLUCOPHAGE) 500 MG tablet TAKE 1 TABLET BY MOUTH EVERY DAY WITH BREAKFAST (Patient not taking: Reported on 04/26/2021) 30 tablet 0 Not Taking  ? methocarbamol (ROBAXIN) 500 MG tablet Take 500 mg by mouth every 8 (eight) hours as needed for muscle spasms. 0.5 tab     ? sucralfate (CARAFATE) 1 GM/10ML suspension Take 10 mLs (1 g total) by mouth 4 (four) times daily -  with meals and at bedtime. 420 mL 0   ? traMADol (ULTRAM) 50 MG tablet Take by mouth every 6 (six) hours as needed. 0.5 tab     ? ?Allergies  ?Allergen Reactions  ? Other Other (See Comments)  ?  ekg pads burns skin needs hypoallergenic  ? Losartan Other (See Comments)  ?  Chest pain.   ? Tramadol Other (See Comments)  ?  GI upset, "I  felt like I was in a fog"  ?  ?Social History  ? ?Tobacco Use  ? Smoking status: Never  ? Smokeless tobacco: Never  ?Substance Use Topics  ? Alcohol use: No  ?  ?Family History  ?Problem Relation Age of On

## 2021-05-14 ENCOUNTER — Encounter (HOSPITAL_COMMUNITY): Payer: Self-pay | Admitting: Orthopedic Surgery

## 2021-05-14 ENCOUNTER — Observation Stay (HOSPITAL_COMMUNITY)

## 2021-05-14 ENCOUNTER — Ambulatory Visit (HOSPITAL_COMMUNITY)

## 2021-05-14 ENCOUNTER — Other Ambulatory Visit: Payer: Self-pay

## 2021-05-14 ENCOUNTER — Ambulatory Visit (HOSPITAL_COMMUNITY): Admitting: Physician Assistant

## 2021-05-14 ENCOUNTER — Observation Stay (HOSPITAL_COMMUNITY)
Admission: RE | Admit: 2021-05-14 | Discharge: 2021-05-16 | Disposition: A | Discharge status: Home / Self Care | Source: Ambulatory Visit | Attending: Orthopedic Surgery | Admitting: Orthopedic Surgery

## 2021-05-14 ENCOUNTER — Ambulatory Visit (HOSPITAL_BASED_OUTPATIENT_CLINIC_OR_DEPARTMENT_OTHER): Admitting: Certified Registered Nurse Anesthetist

## 2021-05-14 ENCOUNTER — Encounter (HOSPITAL_COMMUNITY)
Admission: RE | Disposition: A | Discharge status: Home / Self Care | Payer: Self-pay | Source: Ambulatory Visit | Attending: Orthopedic Surgery

## 2021-05-14 DIAGNOSIS — Z7901 Long term (current) use of anticoagulants: Secondary | ICD-10-CM | POA: Insufficient documentation

## 2021-05-14 DIAGNOSIS — E119 Type 2 diabetes mellitus without complications: Secondary | ICD-10-CM

## 2021-05-14 DIAGNOSIS — I509 Heart failure, unspecified: Secondary | ICD-10-CM

## 2021-05-14 DIAGNOSIS — I480 Paroxysmal atrial fibrillation: Secondary | ICD-10-CM | POA: Diagnosis not present

## 2021-05-14 DIAGNOSIS — I110 Hypertensive heart disease with heart failure: Secondary | ICD-10-CM

## 2021-05-14 DIAGNOSIS — Z79899 Other long term (current) drug therapy: Secondary | ICD-10-CM | POA: Insufficient documentation

## 2021-05-14 DIAGNOSIS — Z7984 Long term (current) use of oral hypoglycemic drugs: Secondary | ICD-10-CM | POA: Diagnosis not present

## 2021-05-14 DIAGNOSIS — R7303 Prediabetes: Secondary | ICD-10-CM | POA: Insufficient documentation

## 2021-05-14 DIAGNOSIS — Z96642 Presence of left artificial hip joint: Secondary | ICD-10-CM | POA: Diagnosis not present

## 2021-05-14 DIAGNOSIS — J45909 Unspecified asthma, uncomplicated: Secondary | ICD-10-CM | POA: Diagnosis not present

## 2021-05-14 DIAGNOSIS — I5032 Chronic diastolic (congestive) heart failure: Secondary | ICD-10-CM | POA: Diagnosis not present

## 2021-05-14 DIAGNOSIS — M1611 Unilateral primary osteoarthritis, right hip: Principal | ICD-10-CM | POA: Insufficient documentation

## 2021-05-14 DIAGNOSIS — Z96641 Presence of right artificial hip joint: Secondary | ICD-10-CM

## 2021-05-14 HISTORY — PX: TOTAL HIP ARTHROPLASTY: SHX124

## 2021-05-14 LAB — ABO/RH: ABO/RH(D): A POS

## 2021-05-14 SURGERY — ARTHROPLASTY, HIP, TOTAL, ANTERIOR APPROACH
Anesthesia: General | Site: Hip | Laterality: Right

## 2021-05-14 MED ORDER — AMLODIPINE BESYLATE 5 MG PO TABS
5.0000 mg | ORAL_TABLET | Freq: Every day | ORAL | Status: DC
  Administered 2021-05-15 – 2021-05-16 (×2): 5 mg via ORAL
  Filled 2021-05-14 (×2): qty 1

## 2021-05-14 MED ORDER — ONDANSETRON HCL 4 MG/2ML IJ SOLN
INTRAMUSCULAR | Status: DC | PRN
  Administered 2021-05-14: 4 mg via INTRAVENOUS

## 2021-05-14 MED ORDER — APIXABAN 5 MG PO TABS
5.0000 mg | ORAL_TABLET | Freq: Two times a day (BID) | ORAL | Status: DC
  Administered 2021-05-15 – 2021-05-16 (×3): 5 mg via ORAL
  Filled 2021-05-14 (×3): qty 1

## 2021-05-14 MED ORDER — PHENOL 1.4 % MT LIQD: 1.0000 | OROMUCOSAL | Status: DC | PRN

## 2021-05-14 MED ORDER — LACTATED RINGERS IV SOLN: INTRAVENOUS | Status: DC

## 2021-05-14 MED ORDER — MIDAZOLAM HCL 2 MG/2ML IJ SOLN
INTRAMUSCULAR | Status: AC
Start: 2021-05-14 — End: ?
  Filled 2021-05-14: qty 2

## 2021-05-14 MED ORDER — TRANEXAMIC ACID-NACL 1000-0.7 MG/100ML-% IV SOLN
1000.0000 mg | Freq: Once | INTRAVENOUS | Status: AC
  Administered 2021-05-14: 1000 mg via INTRAVENOUS
  Filled 2021-05-14: qty 100

## 2021-05-14 MED ORDER — SUGAMMADEX SODIUM 200 MG/2ML IV SOLN
INTRAVENOUS | Status: DC | PRN
  Administered 2021-05-14 (×2): 200 mg via INTRAVENOUS

## 2021-05-14 MED ORDER — METOCLOPRAMIDE HCL 5 MG PO TABS: 5.0000 mg | ORAL_TABLET | Freq: Three times a day (TID) | ORAL | Status: DC | PRN

## 2021-05-14 MED ORDER — METHOCARBAMOL 500 MG IVPB - SIMPLE MED
INTRAVENOUS | Status: AC
  Administered 2021-05-14: 500 mg via INTRAVENOUS
  Filled 2021-05-14: qty 50

## 2021-05-14 MED ORDER — FENTANYL CITRATE PF 50 MCG/ML IJ SOSY
PREFILLED_SYRINGE | INTRAMUSCULAR | Status: AC
  Administered 2021-05-14: 25 ug via INTRAVENOUS
  Filled 2021-05-14: qty 3

## 2021-05-14 MED ORDER — METOPROLOL SUCCINATE ER 25 MG PO TB24
25.0000 mg | ORAL_TABLET | Freq: Two times a day (BID) | ORAL | Status: DC
  Administered 2021-05-14 – 2021-05-16 (×4): 25 mg via ORAL
  Filled 2021-05-14 (×4): qty 1

## 2021-05-14 MED ORDER — HYDROMORPHONE HCL 1 MG/ML IJ SOLN
INTRAMUSCULAR | Status: AC
  Administered 2021-05-14: 1 mg
  Filled 2021-05-14: qty 1

## 2021-05-14 MED ORDER — DEXAMETHASONE SODIUM PHOSPHATE 10 MG/ML IJ SOLN
10.0000 mg | Freq: Once | INTRAMUSCULAR | Status: AC
  Administered 2021-05-15: 10 mg via INTRAVENOUS
  Filled 2021-05-14: qty 1

## 2021-05-14 MED ORDER — MENTHOL 3 MG MT LOZG: 1.0000 | LOZENGE | OROMUCOSAL | Status: DC | PRN

## 2021-05-14 MED ORDER — CEFAZOLIN SODIUM-DEXTROSE 2-4 GM/100ML-% IV SOLN
2.0000 g | Freq: Four times a day (QID) | INTRAVENOUS | Status: AC
  Administered 2021-05-14 (×2): 2 g via INTRAVENOUS
  Filled 2021-05-14 (×2): qty 100

## 2021-05-14 MED ORDER — CEFAZOLIN SODIUM-DEXTROSE 2-4 GM/100ML-% IV SOLN
2.0000 g | INTRAVENOUS | Status: AC
  Administered 2021-05-14: 2 g via INTRAVENOUS
  Filled 2021-05-14: qty 100

## 2021-05-14 MED ORDER — CHLORHEXIDINE GLUCONATE 0.12 % MT SOLN
15.0000 mL | Freq: Once | OROMUCOSAL | Status: AC
  Administered 2021-05-14: 15 mL via OROMUCOSAL

## 2021-05-14 MED ORDER — METHOCARBAMOL 500 MG PO TABS
500.0000 mg | ORAL_TABLET | Freq: Four times a day (QID) | ORAL | Status: DC | PRN
  Administered 2021-05-14 – 2021-05-16 (×4): 500 mg via ORAL
  Filled 2021-05-14 (×4): qty 1

## 2021-05-14 MED ORDER — SUCRALFATE 1 GM/10ML PO SUSP
1.0000 g | Freq: Three times a day (TID) | ORAL | Status: DC
  Administered 2021-05-14 – 2021-05-15 (×4): 1 g via ORAL
  Filled 2021-05-14 (×4): qty 10

## 2021-05-14 MED ORDER — ROCURONIUM BROMIDE 10 MG/ML (PF) SYRINGE
PREFILLED_SYRINGE | INTRAVENOUS | Status: DC | PRN
  Administered 2021-05-14: 70 mg via INTRAVENOUS

## 2021-05-14 MED ORDER — DEXAMETHASONE SODIUM PHOSPHATE 10 MG/ML IJ SOLN
8.0000 mg | Freq: Once | INTRAMUSCULAR | Status: AC
  Administered 2021-05-14: 8 mg via INTRAVENOUS

## 2021-05-14 MED ORDER — ACETAMINOPHEN 160 MG/5ML PO SOLN: 325.0000 mg | Freq: Once | ORAL | Status: DC | PRN

## 2021-05-14 MED ORDER — FERROUS SULFATE 325 (65 FE) MG PO TABS
325.0000 mg | ORAL_TABLET | Freq: Three times a day (TID) | ORAL | Status: DC
  Administered 2021-05-15 (×3): 325 mg via ORAL
  Filled 2021-05-14 (×3): qty 1

## 2021-05-14 MED ORDER — ORAL CARE MOUTH RINSE: 15.0000 mL | Freq: Once | OROMUCOSAL | Status: AC

## 2021-05-14 MED ORDER — MEPERIDINE HCL 50 MG/ML IJ SOLN: 6.2500 mg | INTRAMUSCULAR | Status: DC | PRN

## 2021-05-14 MED ORDER — HYDROMORPHONE HCL 2 MG PO TABS
2.0000 mg | ORAL_TABLET | ORAL | Status: DC | PRN
  Administered 2021-05-16: 2 mg via ORAL
  Filled 2021-05-14 (×2): qty 1

## 2021-05-14 MED ORDER — HYDROMORPHONE HCL 1 MG/ML IJ SOLN
0.2500 mg | INTRAMUSCULAR | Status: DC | PRN
  Administered 2021-05-14: 0.25 mg via INTRAVENOUS

## 2021-05-14 MED ORDER — PROPOFOL 500 MG/50ML IV EMUL
INTRAVENOUS | Status: DC | PRN
  Administered 2021-05-14: 150 ug/kg/min via INTRAVENOUS

## 2021-05-14 MED ORDER — METHOCARBAMOL 500 MG IVPB - SIMPLE MED
500.0000 mg | Freq: Four times a day (QID) | INTRAVENOUS | Status: DC | PRN
  Filled 2021-05-14: qty 50

## 2021-05-14 MED ORDER — ONDANSETRON HCL 4 MG/2ML IJ SOLN
4.0000 mg | Freq: Four times a day (QID) | INTRAMUSCULAR | Status: DC | PRN
  Administered 2021-05-14 – 2021-05-15 (×2): 4 mg via INTRAVENOUS
  Filled 2021-05-14 (×2): qty 2

## 2021-05-14 MED ORDER — ACETAMINOPHEN 10 MG/ML IV SOLN
INTRAVENOUS | Status: AC
  Administered 2021-05-14: 1000 mg via INTRAVENOUS
  Filled 2021-05-14: qty 100

## 2021-05-14 MED ORDER — MIDAZOLAM HCL 5 MG/5ML IJ SOLN
INTRAMUSCULAR | Status: DC | PRN
  Administered 2021-05-14: 2 mg via INTRAVENOUS

## 2021-05-14 MED ORDER — DOCUSATE SODIUM 100 MG PO CAPS
100.0000 mg | ORAL_CAPSULE | Freq: Two times a day (BID) | ORAL | Status: DC
  Administered 2021-05-14 – 2021-05-16 (×4): 100 mg via ORAL
  Filled 2021-05-14 (×4): qty 1

## 2021-05-14 MED ORDER — PROPOFOL 10 MG/ML IV BOLUS
INTRAVENOUS | Status: DC | PRN
  Administered 2021-05-14: 130 mg via INTRAVENOUS

## 2021-05-14 MED ORDER — SCOPOLAMINE 1 MG/3DAYS TD PT72: 1.0000 | MEDICATED_PATCH | Freq: Once | TRANSDERMAL | Status: DC

## 2021-05-14 MED ORDER — DIPHENHYDRAMINE HCL 12.5 MG/5ML PO ELIX
12.5000 mg | ORAL_SOLUTION | ORAL | Status: DC | PRN
  Administered 2021-05-14 – 2021-05-15 (×2): 25 mg via ORAL
  Filled 2021-05-14 (×2): qty 10

## 2021-05-14 MED ORDER — AMISULPRIDE (ANTIEMETIC) 5 MG/2ML IV SOLN: 10.0000 mg | Freq: Once | INTRAVENOUS | Status: DC | PRN

## 2021-05-14 MED ORDER — FENTANYL CITRATE (PF) 100 MCG/2ML IJ SOLN
INTRAMUSCULAR | Status: AC
  Filled 2021-05-14: qty 2

## 2021-05-14 MED ORDER — SODIUM CHLORIDE 0.9 % IV SOLN: INTRAVENOUS | Status: DC

## 2021-05-14 MED ORDER — ACETAMINOPHEN 325 MG PO TABS: 325.0000 mg | ORAL_TABLET | Freq: Once | ORAL | Status: DC | PRN

## 2021-05-14 MED ORDER — ACETAMINOPHEN 10 MG/ML IV SOLN: 1000.0000 mg | Freq: Once | INTRAVENOUS | Status: DC | PRN

## 2021-05-14 MED ORDER — POVIDONE-IODINE 10 % EX SWAB
2.0000 "application " | Freq: Once | CUTANEOUS | Status: AC
  Administered 2021-05-14: 2 via TOPICAL

## 2021-05-14 MED ORDER — BISACODYL 10 MG RE SUPP: 10.0000 mg | Freq: Every day | RECTAL | Status: DC | PRN

## 2021-05-14 MED ORDER — FENTANYL CITRATE PF 50 MCG/ML IJ SOSY
25.0000 ug | PREFILLED_SYRINGE | INTRAMUSCULAR | Status: DC | PRN
  Administered 2021-05-14: 25 ug via INTRAVENOUS

## 2021-05-14 MED ORDER — HYDROMORPHONE HCL 1 MG/ML IJ SOLN
0.5000 mg | INTRAMUSCULAR | Status: DC | PRN
  Administered 2021-05-14 (×2): 1 mg via INTRAVENOUS
  Filled 2021-05-14 (×2): qty 1

## 2021-05-14 MED ORDER — TRANEXAMIC ACID-NACL 1000-0.7 MG/100ML-% IV SOLN
1000.0000 mg | INTRAVENOUS | Status: AC
  Administered 2021-05-14: 1000 mg via INTRAVENOUS
  Filled 2021-05-14: qty 100

## 2021-05-14 MED ORDER — FENTANYL CITRATE (PF) 100 MCG/2ML IJ SOLN
INTRAMUSCULAR | Status: DC | PRN
  Administered 2021-05-14: 100 ug via INTRAVENOUS

## 2021-05-14 MED ORDER — PROPRANOLOL HCL 10 MG PO TABS
10.0000 mg | ORAL_TABLET | Freq: Four times a day (QID) | ORAL | Status: DC | PRN
  Filled 2021-05-14: qty 1

## 2021-05-14 MED ORDER — SODIUM CHLORIDE 0.9 % IR SOLN
Status: DC | PRN
  Administered 2021-05-14: 1000 mL

## 2021-05-14 MED ORDER — FENTANYL CITRATE (PF) 250 MCG/5ML IJ SOLN
INTRAMUSCULAR | Status: AC
  Filled 2021-05-14: qty 5

## 2021-05-14 MED ORDER — HYDROMORPHONE HCL 2 MG PO TABS
1.0000 mg | ORAL_TABLET | ORAL | Status: DC | PRN
  Administered 2021-05-14 – 2021-05-16 (×7): 2 mg via ORAL
  Filled 2021-05-14 (×7): qty 1

## 2021-05-14 MED ORDER — LIDOCAINE 2% (20 MG/ML) 5 ML SYRINGE
INTRAMUSCULAR | Status: DC | PRN
  Administered 2021-05-14: 60 mg via INTRAVENOUS

## 2021-05-14 MED ORDER — POLYETHYLENE GLYCOL 3350 17 G PO PACK: 17.0000 g | PACK | Freq: Every day | ORAL | Status: DC | PRN

## 2021-05-14 MED ORDER — METOCLOPRAMIDE HCL 5 MG/ML IJ SOLN: 5.0000 mg | Freq: Three times a day (TID) | INTRAMUSCULAR | Status: DC | PRN

## 2021-05-14 MED ORDER — DICYCLOMINE HCL 20 MG PO TABS
20.0000 mg | ORAL_TABLET | Freq: Two times a day (BID) | ORAL | Status: DC
  Administered 2021-05-14 – 2021-05-15 (×3): 20 mg via ORAL
  Filled 2021-05-14 (×4): qty 1

## 2021-05-14 MED ORDER — ALBUTEROL SULFATE (2.5 MG/3ML) 0.083% IN NEBU: 2.5000 mg | INHALATION_SOLUTION | Freq: Four times a day (QID) | RESPIRATORY_TRACT | Status: DC | PRN

## 2021-05-14 MED ORDER — ACETAMINOPHEN 500 MG PO TABS
1000.0000 mg | ORAL_TABLET | Freq: Four times a day (QID) | ORAL | Status: DC
  Administered 2021-05-14 – 2021-05-15 (×4): 1000 mg via ORAL
  Filled 2021-05-14 (×5): qty 2

## 2021-05-14 MED ORDER — ONDANSETRON HCL 4 MG PO TABS: 4.0000 mg | ORAL_TABLET | Freq: Four times a day (QID) | ORAL | Status: DC | PRN

## 2021-05-14 SURGICAL SUPPLY — 45 items
ADH SKN CLS APL DERMABOND .7 (GAUZE/BANDAGES/DRESSINGS) ×1
BAG COUNTER SPONGE SURGICOUNT (BAG) IMPLANT
BAG DECANTER FOR FLEXI CONT (MISCELLANEOUS) IMPLANT
BAG SPEC THK2 15X12 ZIP CLS (MISCELLANEOUS)
BAG SPNG CNTER NS LX DISP (BAG)
BAG ZIPLOCK 12X15 (MISCELLANEOUS) IMPLANT
BLADE SAG 18X100X1.27 (BLADE) ×3 IMPLANT
COVER PERINEAL POST (MISCELLANEOUS) ×3 IMPLANT
COVER SURGICAL LIGHT HANDLE (MISCELLANEOUS) ×3 IMPLANT
CUP ACETBLR 52 OD PINNACLE (Hips) ×1 IMPLANT
DERMABOND ADVANCED (GAUZE/BANDAGES/DRESSINGS) ×1
DERMABOND ADVANCED .7 DNX12 (GAUZE/BANDAGES/DRESSINGS) ×2 IMPLANT
DRAPE FOOT SWITCH (DRAPES) ×3 IMPLANT
DRAPE STERI IOBAN 125X83 (DRAPES) ×3 IMPLANT
DRAPE U-SHAPE 47X51 STRL (DRAPES) ×6 IMPLANT
DRESSING AQUACEL AG SP 3.5X10 (GAUZE/BANDAGES/DRESSINGS) ×2 IMPLANT
DRSG AQUACEL AG SP 3.5X10 (GAUZE/BANDAGES/DRESSINGS) ×2
DURAPREP 26ML APPLICATOR (WOUND CARE) ×3 IMPLANT
ELECT REM PT RETURN 15FT ADLT (MISCELLANEOUS) ×3 IMPLANT
ELIMINATOR HOLE APEX DEPUY (Hips) ×1 IMPLANT
GLOVE BIO SURGEON STRL SZ 6 (GLOVE) ×3 IMPLANT
GLOVE BIOGEL PI IND STRL 6.5 (GLOVE) ×2 IMPLANT
GLOVE BIOGEL PI IND STRL 7.5 (GLOVE) ×2 IMPLANT
GLOVE BIOGEL PI INDICATOR 6.5 (GLOVE) ×1
GLOVE BIOGEL PI INDICATOR 7.5 (GLOVE) ×1
GLOVE ORTHO TXT STRL SZ7.5 (GLOVE) ×6 IMPLANT
GOWN STRL REUS W/ TWL LRG LVL3 (GOWN DISPOSABLE) ×4 IMPLANT
GOWN STRL REUS W/TWL LRG LVL3 (GOWN DISPOSABLE) ×4
HEAD CERAMIC DELTA 36 PLUS 1.5 (Hips) ×1 IMPLANT
HOLDER FOLEY CATH W/STRAP (MISCELLANEOUS) ×3 IMPLANT
KIT TURNOVER KIT A (KITS) IMPLANT
LINER NEUTRAL 52X36MM PLUS 4 (Liner) ×1 IMPLANT
PACK ANTERIOR HIP CUSTOM (KITS) ×3 IMPLANT
SCREW 6.5MMX25MM (Screw) ×1 IMPLANT
SPONGE T-LAP 18X18 ~~LOC~~+RFID (SPONGE) ×9 IMPLANT
STEM FEM ACTIS HIGH SZ2 (Stem) ×1 IMPLANT
SUT MNCRL AB 4-0 PS2 18 (SUTURE) ×3 IMPLANT
SUT STRATAFIX 0 PDS 27 VIOLET (SUTURE) ×2
SUT VIC AB 1 CT1 36 (SUTURE) ×9 IMPLANT
SUT VIC AB 2-0 CT1 27 (SUTURE) ×4
SUT VIC AB 2-0 CT1 TAPERPNT 27 (SUTURE) ×4 IMPLANT
SUTURE STRATFX 0 PDS 27 VIOLET (SUTURE) ×2 IMPLANT
TRAY FOLEY MTR SLVR 16FR STAT (SET/KITS/TRAYS/PACK) IMPLANT
TUBE SUCTION HIGH CAP CLEAR NV (SUCTIONS) ×3 IMPLANT
WATER STERILE IRR 1000ML POUR (IV SOLUTION) ×3 IMPLANT

## 2021-05-14 NOTE — Progress Notes (Signed)
PT Cancellation Note ? ?Patient Details ?Name: [name] ?MRN: [id number] ?DOB: 24-Apr-1955 ? ? ?Cancelled Treatment:    Reason Eval/Treat Not Completed: Pain limiting ability to participate. Pt moaning and tossing in bed. RN aware. Will check back as schedule and pt status allows. ? ?Coolidge Breeze, PT, DPT ?[facility] ?Office: [phone] ?Pager: [phone] ? ?Coolidge Breeze ?05/14/2021, 1:06 PM ?

## 2021-05-14 NOTE — Anesthesia Postprocedure Evaluation (Signed)
Anesthesia Post Note ? ?Patient: [name] ? ?Procedure(s) Performed: TOTAL HIP ARTHROPLASTY ANTERIOR APPROACH (Right: Hip) ? ?  ? ?Patient location during evaluation: PACU ?Anesthesia Type: General ?Level of consciousness: awake and alert ?Pain management: pain level controlled ?Vital Signs Assessment: post-procedure vital signs reviewed and stable ?Respiratory status: spontaneous breathing, nonlabored ventilation, respiratory function stable and patient connected to nasal cannula oxygen ?Cardiovascular status: blood pressure returned to baseline and stable ?Postop Assessment: no apparent nausea or vomiting ?Anesthetic complications: no ? ? ?No notable events documented. ? ?Last Vitals:  ?Vitals:  ? 05/14/21 1232 05/14/21 1409  ?BP: 133/83 135/82  ?Pulse: (!) 55 71  ?Resp: 14 20  ?Temp: 36.4 ?C 37.2 ?C  ?SpO2: 100% 100%  ?  ?Last Pain:  ?Vitals:  ? 05/14/21 1232  ?TempSrc: Oral  ?PainSc:   ? ? ?  ?  ?  ?  ?  ?  ? ?[name] ? ? ? ? ?

## 2021-05-14 NOTE — Plan of Care (Signed)
  Problem: Activity: Goal: Risk for activity intolerance will decrease Outcome: Progressing   Problem: Pain Managment: Goal: General experience of comfort will improve Outcome: Progressing   Problem: Safety: Goal: Ability to remain free from injury will improve Outcome: Progressing   

## 2021-05-14 NOTE — Op Note (Signed)
? NAME:  [name]                ACCOUNT NO.: [id number]  ?   ? MEDICAL RECORD NO.: [id number]  ?   ? FACILITY:  [facility]  ?   ? PHYSICIAN:  [name]  DATE OF BIRTH:  [birth year] ?   ? DATE OF PROCEDURE:  05/14/2021 ?   ?                             OPERATIVE REPORT  ?   ?   ? PREOPERATIVE DIAGNOSIS: Right  hip osteoarthritis.  ?   ? POSTOPERATIVE DIAGNOSIS:  Right hip osteoarthritis.  ?   ? PROCEDURE:  Right total hip replacement through an anterior approach  ? utilizing DePuy THR system, component size 52 mm pinnacle cup, a size 36+4 neutral  ? Altrex liner, a size 2 Hi Actis stem with a 36+1.5 delta ceramic  ? ball.  ?   ? SURGEON:  Pietro Cassis. Alvan Dame, M.D.  ?   ? ASSISTANT:  Costella Hatcher, PA-C ?   ? ANESTHESIA:  Spinal.  ?   ? SPECIMENS:  None.  ?   ? COMPLICATIONS:  None.  ?   ? BLOOD LOSS:  200 cc ?   ? DRAINS:  None.  ?   ? INDICATION OF THE PROCEDURE:  [name] is a 66 y.o. female who had  ? presented to office for evaluation of right hip pain.  Radiographs revealed  ? progressive degenerative changes with bone-on-bone  ? articulation of the  hip joint, including subchondral cystic changes and osteophytes.  The patient had painful limited range of  ? motion significantly affecting their overall quality of life and function.  The patient was failing to   ? respond to conservative measures including medications and/or injections and activity modification and at this point was ready  ? to proceed with more definitive measures.  Consent was obtained for  ? benefit of pain relief.  Specific risks of infection, DVT, component  ? failure, dislocation, neurovascular injury, and need for revision surgery were reviewed in the office. ?   ? PROCEDURE IN DETAIL:  The patient was brought to operative theater.  ? Once adequate anesthesia, preoperative antibiotics, 2 gm of Ancef, 1 gm of Tranexamic Acid, and 10 mg of Decadron were administered, the patient was positioned supine on the  [employer] table.  Once the patient was safely positioned with adequate padding of boney prominences we predraped out the hip, and used fluoroscopy to confirm orientation of the pelvis.  ?   ? The right hip was then prepped and draped from proximal iliac crest to  ? mid thigh with a shower curtain technique.  ?   ? Time-out was performed identifying the patient, planned procedure, and the appropriate extremity.   ? ? An incision was then made 2 cm lateral to the  ? anterior superior iliac spine extending over the orientation of the  ? tensor fascia lata muscle and sharp dissection was carried down to the  ? fascia of the muscle.  ?   ? The fascia was then incised.  The muscle belly was identified and swept  ? laterally and retractor placed along the superior neck.  Following  ? cauterization of the circumflex vessels and removing some pericapsular  ? fat, a second cobra retractor was placed on the inferior  neck.  A T-capsulotomy was made along the line of the  ? superior neck to the trochanteric fossa, then extended proximally and  ? distally.  Tag sutures were placed and the retractors were then placed  ? intracapsular.  We then identified the trochanteric fossa and  ? orientation of my neck cut and then made a neck osteotomy with the femur on traction.  The femoral  ? head was removed without difficulty or complication.  Traction was let  ? off and retractors were placed posterior and anterior around the  ? acetabulum.  ?   ? The labrum and foveal tissue were debrided.  I began reaming with a 45 mm  ? reamer and reamed up to 51 mm reamer with good bony bed preparation and a 52 mm ? cup was chosen.  The final 52 mm Pinnacle cup was then impacted under fluoroscopy to confirm the depth of penetration and orientation with respect to  ? Abduction and forward flexion.  A screw was placed into the ilium followed by the hole eliminator.  The final  ? 36+4 neutral Altrex liner was impacted with good visualized rim fit.  The  cup was positioned anatomically within the acetabular portion of the pelvis.  ?   ? At this point, the femur was rolled to 100 degrees.  Further capsule was  ? released off the inferior aspect of the femoral neck.  I then  ? released the superior capsule proximally.  With the leg in a neutral position the hook was placed laterally  ? along the femur under the vastus lateralis origin and elevated manually and then held in position using the hook attachment on the bed.  The leg was then extended and adducted with the leg rolled to 100  ? degrees of external rotation.  Retractors were placed along the medial calcar and posteriorly over the greater trochanter.  Once the proximal femur was fully  ? exposed, I used a box osteotome to set orientation.  I then began  ? broaching with the starting chili pepper broach and passed this by hand and then broached up to 2.  With the 2 broach in place I chose a high offset neck and did several trial reductions.  The offset was appropriate, leg lengths  ? appeared to be equal best matched with the +1.5 head ball trial confirmed radiographically.  ? Given these findings, I went ahead and dislocated the hip, repositioned all  ? retractors and positioned the right hip in the extended and abducted position.  ?The final 2 Hi Actis stem was  ? chosen and it was impacted down to the level of neck cut.  Based on this  ? and the trial reductions, a final 36+1.5 delta ceramic ball was chosen and  ? impacted onto a clean and dry trunnion, and the hip was reduced.  The  ? hip had been irrigated throughout the case again at this point.  I did  ? reapproximate the superior capsular leaflet to the anterior leaflet  ? using #1 Vicryl.  The fascia of the  ? tensor fascia lata muscle was then reapproximated using #1 Vicryl and #0 Stratafix sutures.  The  ? remaining wound was closed with 2-0 Vicryl and running 4-0 Monocryl.  ? The hip was cleaned, dried, and dressed sterilely using Dermabond and  ?  Aquacel dressing.  The patient was then brought  ? to recovery room in stable condition tolerating the procedure well.  ? ? Caryl Pina  Lu Duffel, PA-C was present for the entirety of the case involved from  ? preoperative positioning, perioperative retractor management, general  ? facilitation of the case, as well as primary wound closure as assistant.  ?   ?   ?   ? Pietro Cassis Alvan Dame, M.D.  ?   ?   ?05/14/2021 8:38 AM  ?   ?   ?

## 2021-05-14 NOTE — Anesthesia Procedure Notes (Signed)
Procedure Name: Intubation ?Date/Time: 05/14/2021 8:55 AM ?Performed by: Gean Maidens, CRNA ?Pre-anesthesia Checklist: Patient identified, Emergency Drugs available, Suction available, Patient being monitored and Timeout performed ?Patient Re-evaluated:Patient Re-evaluated prior to induction ?Oxygen Delivery Method: Circle system utilized ?Preoxygenation: Pre-oxygenation with 100% oxygen ?Ventilation: Mask ventilation without difficulty ?Laryngoscope Size: Glidescope and 3 ?Grade View: Grade I ?Tube type: Oral ?Tube size: 7.0 mm ?Number of attempts: 1 ?Airway Equipment and Method: Stylet and Video-laryngoscopy ?Placement Confirmation: ETT inserted through vocal cords under direct vision, positive ETCO2 and breath sounds checked- equal and bilateral ?Secured at: 22 cm ?Tube secured with: Tape ?Dental Injury: Teeth and Oropharynx as per pre-operative assessment  ? ? ? ? ?

## 2021-05-14 NOTE — Interval H&P Note (Signed)
History and Physical Interval Note: ? ?05/14/2021 ?6:31 AM ? ?[name]  has presented today for surgery, with the diagnosis of Right hip osteoarthritis.  The various methods of treatment have been discussed with the patient and family. After consideration of risks, benefits and other options for treatment, the patient has consented to  Procedure(s): ?TOTAL HIP ARTHROPLASTY ANTERIOR APPROACH (Right) as a surgical intervention.  The patient's history has been reviewed, patient examined, no change in status, stable for surgery.  I have reviewed the patient's chart and labs.  Questions were answered to the patient's satisfaction.   ? ? ?[name] ? ? ?

## 2021-05-14 NOTE — Discharge Instructions (Signed)

## 2021-05-14 NOTE — Anesthesia Preprocedure Evaluation (Addendum)
Anesthesia Evaluation  ?Patient identified by MRN, date of birth, ID band ?Patient awake ? ? ? ?Reviewed: ?Allergy & Precautions, NPO status , Patient's Chart, lab work & pertinent test results ? ?History of Anesthesia Complications ?(+) PONV and history of anesthetic complications ? ?Airway ?Mallampati: II ? ?TM Distance: >3 FB ?Neck ROM: Full ? ? ? Dental ? ?(+) Partial Upper, Dental Advisory Given ?  ?Pulmonary ?asthma , sleep apnea ,  ?  ?breath sounds clear to auscultation ? ? ? ? ? ? Cardiovascular ?hypertension, Pt. on medications and Pt. on home beta blockers ?+CHF  ?+ dysrhythmias Atrial Fibrillation  ?Rhythm:Regular Rate:Normal ? ? ?  ?Neuro/Psych ?PSYCHIATRIC DISORDERS Depression  Neuromuscular disease   ? GI/Hepatic ?Neg liver ROS, hiatal hernia, GERD  ,  ?Endo/Other  ?diabetes, Type 2, Oral Hypoglycemic Agents ? Renal/GU ?negative Renal ROS  ? ?  ?Musculoskeletal ? ?(+) Arthritis ,  ? Abdominal ?Normal abdominal exam  (+)   ?Peds ? Hematology ?negative hematology ROS ?(+)   ?Anesthesia Other Findings ? ? Reproductive/Obstetrics ? ?  ? ? ? ? ? ? ? ? ? ? ? ? ? ?  ?  ? ? ? ? ? ? ? ?Anesthesia Physical ?Anesthesia Plan ? ?ASA: 3 ? ?Anesthesia Plan: General  ? ?Post-op Pain Management: Ofirmev IV (intra-op)*  ? ?Induction: Intravenous ? ?PONV Risk Score and Plan: 4 or greater and Ondansetron, Dexamethasone, Midazolam, Scopolamine patch - Pre-op and TIVA ? ?Airway Management Planned: Video Laryngoscope Planned and Oral ETT ? ?Additional Equipment: None ? ?Intra-op Plan:  ? ?Post-operative Plan: Extubation in OR ? ?Informed Consent: I have reviewed the patients History and Physical, chart, labs and discussed the procedure including the risks, benefits and alternatives for the proposed anesthesia with the patient or authorized representative who has indicated his/her understanding and acceptance.  ? ? ? ?Dental advisory given ? ?Plan Discussed with: CRNA ? ?Anesthesia Plan  Comments: (Lab Results ?     Component                Value               Date                 ?     WBC                      5.0                 05/05/2021           ?     HGB                      14.5                05/05/2021           ?     HCT                      43.7                05/05/2021           ?     MCV                      98.0                05/05/2021           ?  PLT                      359                 05/05/2021           ?)  ? ? ? ? ?Anesthesia Quick Evaluation ? ?

## 2021-05-14 NOTE — Transfer of Care (Signed)
Immediate Anesthesia Transfer of Care Note ? ?Patient: [name] ? ?Procedure(s) Performed: TOTAL HIP ARTHROPLASTY ANTERIOR APPROACH (Right: Hip) ? ?Patient Location: PACU ? ?Anesthesia Type:General ? ?Level of Consciousness: sedated, patient cooperative and responds to stimulation ? ?Airway & Oxygen Therapy: Patient Spontanous Breathing and aerosol face mask ? ?Post-op Assessment: Report given to RN and Post -op Vital signs reviewed and stable ? ?Post vital signs: Reviewed and stable ? ?Last Vitals:  ?Vitals Value Taken Time  ?BP 127/76 05/14/21 1030  ?Temp    ?Pulse 63 05/14/21 1034  ?Resp    ?SpO2 93 % 05/14/21 1034  ?Vitals shown include unvalidated device data. ? ?Last Pain:  ?Vitals:  ? 05/14/21 0645  ?TempSrc:   ?PainSc: 2   ?   ? ?Patients Stated Pain Goal: 3 (05/14/21 0645) ? ?Complications: No notable events documented. ?

## 2021-05-15 ENCOUNTER — Encounter (HOSPITAL_COMMUNITY): Payer: Self-pay | Admitting: Orthopedic Surgery

## 2021-05-15 DIAGNOSIS — M1611 Unilateral primary osteoarthritis, right hip: Secondary | ICD-10-CM | POA: Diagnosis not present

## 2021-05-15 LAB — BASIC METABOLIC PANEL
Anion gap: 10 (ref 5–15)
BUN: 12 mg/dL (ref 8–23)
CO2: 25 mmol/L (ref 22–32)
Calcium: 9 mg/dL (ref 8.9–10.3)
Chloride: 102 mmol/L (ref 98–111)
Creatinine, Ser: 0.91 mg/dL (ref 0.44–1.00)
GFR, Estimated: >60 mL/min (ref >60)
Glucose, Bld: 154 mg/dL — ABNORMAL HIGH (ref 70–99)
Potassium: 4.3 mmol/L (ref 3.5–5.1)
Sodium: 137 mmol/L (ref 135–145)

## 2021-05-15 LAB — CBC
HCT: 38.9 % (ref 36.0–46.0)
Hemoglobin: 13 g/dL (ref 12.0–15.0)
MCH: 33.6 pg (ref 26.0–34.0)
MCHC: 33.4 g/dL (ref 30.0–36.0)
MCV: 100.5 fL — ABNORMAL HIGH (ref 80.0–100.0)
Platelets: 313 10*3/uL (ref 150–400)
RBC: 3.87 MIL/uL (ref 3.87–5.11)
RDW: 12.6 % (ref 11.5–15.5)
WBC: 15.8 10*3/uL — ABNORMAL HIGH (ref 4.0–10.5)
nRBC: 0 % (ref 0.0–0.2)

## 2021-05-15 MED ORDER — ONDANSETRON HCL 4 MG PO TABS: 4.0000 mg | ORAL_TABLET | Freq: Four times a day (QID) | ORAL | 0 refills | Status: DC | PRN

## 2021-05-15 MED ORDER — HYDROMORPHONE HCL 2 MG PO TABS: 2.0000 mg | ORAL_TABLET | ORAL | 0 refills | Status: DC | PRN

## 2021-05-15 MED ORDER — METHOCARBAMOL 500 MG PO TABS: 500.0000 mg | ORAL_TABLET | Freq: Four times a day (QID) | ORAL | 0 refills | Status: DC | PRN

## 2021-05-15 MED ORDER — ACETAMINOPHEN 500 MG PO TABS
1000.0000 mg | ORAL_TABLET | Freq: Four times a day (QID) | ORAL | 0 refills | Status: DC
Start: 2021-05-15 — End: 2021-09-18

## 2021-05-15 MED ORDER — DOCUSATE SODIUM 100 MG PO CAPS: 100.0000 mg | ORAL_CAPSULE | Freq: Two times a day (BID) | ORAL | 0 refills | Status: DC

## 2021-05-15 MED ORDER — POLYETHYLENE GLYCOL 3350 17 G PO PACK: 17.0000 g | PACK | Freq: Every day | ORAL | 0 refills | Status: DC | PRN

## 2021-05-15 MED ORDER — CEFADROXIL 500 MG PO CAPS: 500.0000 mg | ORAL_CAPSULE | Freq: Two times a day (BID) | ORAL | 0 refills | Status: AC

## 2021-05-15 NOTE — Progress Notes (Signed)
Physical Therapy Treatment ?Patient Details ?Name: [name] ?MRN: [id number] ?DOB: 1955-12-10 ?Today's Date: 05/15/2021 ? ? ?History of Present Illness Pt is a 66yo female presenting s/p R-THA, AA on 05/14/21. PMH: OA, ack pain, CHF, depression, HTN, GERD, OSA on CPAP, PAF, lumbar fusion 2016, L-THA 2022, cervical fusion ? ?  ?PT Comments  ? ? Pt tolerated increased ambulation distance of 51' with RW with improved ability to advance RLE. Ambulation distance is limited by R hip pain. Pt performed THA HEP with good technique. She would benefit from one more day in the hospital in order to meet PT goals.    ?Recommendations for follow up therapy are one component of a multi-disciplinary discharge planning process, led by the attending physician.  Recommendations may be updated based on patient status, additional functional criteria and insurance authorization. ? ?Follow Up Recommendations ? Follow physician's recommendations for discharge plan and follow up therapies ?  ?  ?Assistance Recommended at Discharge Intermittent Supervision/Assistance  ?Patient can return home with the following A little help with bathing/dressing/bathroom;Assistance with cooking/housework;Assist for transportation;Help with stairs or ramp for entrance ?  ?Equipment Recommendations ? None recommended by PT  ?  ?Recommendations for Other Services   ? ? ?  ?Precautions / Restrictions Precautions ?Precautions: Fall ?Precaution Comments: pt denies falls in past 6 months ?Restrictions ?Weight Bearing Restrictions: No  ?  ? ?Mobility ? Bed Mobility ?Overal bed mobility: Needs Assistance ?Bed Mobility: Sit to Supine ?  ?  ?Supine to sit: Mod assist, HOB elevated ?Sit to supine: Min assist ?  ?General bed mobility comments: assist for LEs into bed ?  ? ?Transfers ?Overall transfer level: Needs assistance ?Equipment used: Rolling walker (2 wheels) ?Transfers: Sit to/from Stand ?Sit to Stand: From elevated surface, Min assist ?  ?  ?  ?  ?  ?General  transfer comment: VCs hand placement, assist to power up ?  ? ?Ambulation/Gait ?Ambulation/Gait assistance: Min guard ?Gait Distance (Feet): 35 Feet ?Assistive device: Rolling walker (2 wheels) ?Gait Pattern/deviations: Step-to pattern, Decreased step length - right ?Gait velocity: decr ?  ?  ?General Gait Details: able to advance RLE without assist,  distance limited by pain, no loss of balance ? ? ?Stairs ?  ?  ?  ?  ?  ? ? ?Wheelchair Mobility ?  ? ?Modified Rankin (Stroke Patients Only) ?  ? ? ?  ?Balance Overall balance assessment: Modified Independent ?  ?  ?  ?  ?  ?  ?  ?  ?  ?  ?  ?  ?  ?  ?  ?  ?  ?  ?  ? ?  ?Cognition Arousal/Alertness: Awake/alert ?Behavior During Therapy: [facility] for tasks assessed/performed ?Overall Cognitive Status: Within Functional Limits for tasks assessed ?  ?  ?  ?  ?  ?  ?  ?  ?  ?  ?  ?  ?  ?  ?  ?  ?  ?  ?  ? ?  ?Exercises Total Joint Exercises ?Ankle Circles/Pumps: AROM, 10 reps, Supine ?Quad Sets: AROM, Right, 5 reps, Supine ?Heel Slides: AAROM, Right, 10 reps, Supine ?Hip ABduction/ADduction: AAROM, Right, Supine, 10 reps ? ?  ?General Comments   ?  ?  ? ?Pertinent Vitals/Pain Pain Assessment ?Pain Assessment: 0-10 ?Pain Score: 8  ?Pain Location: R hip with walking ?Pain Descriptors / Indicators: Heaviness, Burning ?Pain Intervention(s): Limited activity within patient's tolerance, Monitored during session, Premedicated before session, Ice applied  ? ? ?  Home Living Family/patient expects to be discharged to:: Private residence ?Living Arrangements: Spouse/significant other ?Available Help at Discharge: Family ?Type of Home: House ?Home Access: Stairs to enter ?Entrance Stairs-Rails: None ?Entrance Stairs-Number of Steps: 1 ?  ?Home Layout: One level ?Home Equipment: Shower seat;Toilet riser;Rolling Walker (2 wheels);Rollator (4 wheels);Cane - single point ?   ?  ?Prior Function    ?  ?  ?   ? ?PT Goals (current goals can now be found in the care plan section) Acute Rehab PT  Goals ?Patient Stated Goal: travel ?PT Goal Formulation: With patient/family ?Time For Goal Achievement: 05/22/21 ?Potential to Achieve Goals: Good ?Progress towards PT goals: Progressing toward goals ? ?  ?Frequency ? ? ? 7X/week ? ? ? ?  ?PT Plan Current plan remains appropriate  ? ? ?Co-evaluation   ?  ?  ?  ?  ? ?  ?AM-PAC PT "6 Clicks" Mobility   ?Outcome Measure ? Help needed turning from your back to your side while in a flat bed without using bedrails?: A Little ?Help needed moving from lying on your back to sitting on the side of a flat bed without using bedrails?: A Lot ?Help needed moving to and from a bed to a chair (including a wheelchair)?: A Little ?Help needed standing up from a chair using your arms (e.g., wheelchair or bedside chair)?: A Little ?Help needed to walk in hospital room?: A Little ?Help needed climbing 3-5 steps with a railing? : A Lot ?6 Click Score: 16 ? ?  ?End of Session Equipment Utilized During Treatment: Gait belt ?Activity Tolerance: Patient tolerated treatment well ?Patient left: with call bell/phone within reach;with bed alarm set;in bed ?Nurse Communication: Mobility status ?PT Visit Diagnosis: Difficulty in walking, not elsewhere classified (R26.2);Pain ?Pain - Right/Left: Right ?Pain - part of body: Hip ?  ? ? ?Time: 9390-3009 ?PT Time Calculation (min) (ACUTE ONLY): 28 min ? ?Charges:  $Gait Training: 8-22 mins ?$Therapeutic Exercise: 8-22 mins          ?          ? ?Philomena Doheny PT 05/15/2021  ?Acute Rehabilitation Services ?Pager [phone] ?Office [phone] ? ? ?

## 2021-05-15 NOTE — Evaluation (Signed)
Physical Therapy Evaluation ?Patient Details ?Name: [name] ?MRN: [id number] ?DOB: [birth year] ?Today's Date: 05/15/2021 ? ?History of Present Illness ? Pt is a 66yo female presenting s/p R-THA, AA on 05/14/21. PMH: OA, ack pain, CHF, depression, HTN, GERD, OSA on CPAP, PAF, lumbar fusion 2016, L-THA 2022, cervical fusion  ?Clinical Impression ? Pt is s/p THA resulting in the deficits listed below (see PT Problem List). Mod assist for bed mobility, min A transfers, min A to advance RLE when ambulating 8', distance limited by pain. Pt reports R hip was significantly weak PTA. Will plan to see her for a second session this afternoon, she will likely need another day in the hospital in order to meet PT goals.  Pt will benefit from skilled PT to increase their independence and safety with mobility to allow discharge to the venue listed below.  ?   ?   ? ?Recommendations for follow up therapy are one component of a multi-disciplinary discharge planning process, led by the attending physician.  Recommendations may be updated based on patient status, additional functional criteria and insurance authorization. ? ?Follow Up Recommendations Follow physician's recommendations for discharge plan and follow up therapies ? ?  ?Assistance Recommended at Discharge Intermittent Supervision/Assistance  ?Patient can return home with the following ? A little help with bathing/dressing/bathroom;Assistance with cooking/housework;Assist for transportation;Help with stairs or ramp for entrance ? ?  ?Equipment Recommendations None recommended by PT  ?Recommendations for Other Services ?    ?  ?Functional Status Assessment Patient has had a recent decline in their functional status and demonstrates the ability to make significant improvements in function in a reasonable and predictable amount of time.  ? ?  ?Precautions / Restrictions Precautions ?Precautions: Fall ?Precaution Comments: pt denies falls in past 6  months ?Restrictions ?Weight Bearing Restrictions: No  ? ?  ? ?Mobility ? Bed Mobility ?Overal bed mobility: Needs Assistance ?Bed Mobility: Supine to Sit ?  ?  ?Supine to sit: Mod assist, HOB elevated ?  ?  ?General bed mobility comments: assist to advance RLE, pivot hips and raise trunk, HOB up, used rail ?  ? ?Transfers ?Overall transfer level: Needs assistance ?Equipment used: Rolling walker (2 wheels) ?Transfers: Sit to/from Stand ?Sit to Stand: From elevated surface, Min assist ?  ?  ?  ?  ?  ?General transfer comment: VCs hand placement, assist to power up ?  ? ?Ambulation/Gait ?Ambulation/Gait assistance: Min assist ?Gait Distance (Feet): 8 Feet ?Assistive device: Rolling walker (2 wheels) ?Gait Pattern/deviations: Step-to pattern, Decreased step length - right ?Gait velocity: decr ?  ?  ?General Gait Details: pt required min A to advance RLE, distance limited by pain, no loss of balance ? ?Stairs ?  ?  ?  ?  ?  ? ?Wheelchair Mobility ?  ? ?Modified Rankin (Stroke Patients Only) ?  ? ?  ? ?Balance Overall balance assessment: Modified Independent ?  ?  ?  ?  ?  ?  ?  ?  ?  ?  ?  ?  ?  ?  ?  ?  ?  ?  ?   ? ? ? ?Pertinent Vitals/Pain Pain Assessment ?Pain Assessment: 0-10 ?Pain Score: 8  ?Pain Location: R hip ?Pain Descriptors / Indicators: Heaviness ?Pain Intervention(s): Limited activity within patient's tolerance, Monitored during session, Premedicated before session, Ice applied, Repositioned  ? ? ?Home Living Family/patient expects to be discharged to:: Private residence ?Living Arrangements: Spouse/significant other ?Available Help at Discharge: Family ?Type of  Home: House ?Home Access: Stairs to enter ?Entrance Stairs-Rails: None ?Entrance Stairs-Number of Steps: 1 ?  ?Home Layout: One level ?Home Equipment: Shower seat;Toilet riser;Rolling Walker (2 wheels);Rollator (4 wheels);Cane - single point ?   ?  ?Prior Function Prior Level of Function : Independent/Modified Independent;Driving ?  ?  ?  ?  ?  ?   ?Mobility Comments: used cane or rollator PTA, no falls in past 6 months ?ADLs Comments: independent ?  ? ? ?Hand Dominance  ?   ? ?  ?Extremity/Trunk Assessment  ? Upper Extremity Assessment ?Upper Extremity Assessment: Overall WFL for tasks assessed ?  ? ?Lower Extremity Assessment ?Lower Extremity Assessment: RLE deficits/detail ?RLE Deficits / Details: hip flexion +2/5 ?RLE Sensation: WNL ?RLE Coordination: WNL ?  ? ?Cervical / Trunk Assessment ?Cervical / Trunk Assessment: Normal  ?Communication  ? Communication: No difficulties  ?Cognition Arousal/Alertness: Awake/alert ?Behavior During Therapy: [facility] for tasks assessed/performed ?Overall Cognitive Status: Within Functional Limits for tasks assessed ?  ?  ?  ?  ?  ?  ?  ?  ?  ?  ?  ?  ?  ?  ?  ?  ?  ?  ?  ? ?  ?General Comments   ? ?  ?Exercises Total Joint Exercises ?Ankle Circles/Pumps: AROM, 10 reps, Supine ?Heel Slides: AAROM, Right, 10 reps, Supine ?Hip ABduction/ADduction: AAROM, Right, 5 reps, Supine  ? ?Assessment/Plan  ?  ?PT Assessment Patient needs continued PT services  ?PT Problem List Decreased mobility;Decreased range of motion;Decreased activity tolerance;Pain ? ?   ?  ?PT Treatment Interventions DME instruction;Gait training;Stair training;Therapeutic exercise;Patient/family education;Therapeutic activities;Functional mobility training   ? ?PT Goals (Current goals can be found in the Care Plan section)  ?Acute Rehab PT Goals ?Patient Stated Goal: travel ?PT Goal Formulation: With patient/family ?Time For Goal Achievement: 05/22/21 ?Potential to Achieve Goals: Good ? ?  ?Frequency 7X/week ?  ? ? ?Co-evaluation   ?  ?  ?  ?  ? ? ?  ?AM-PAC PT "6 Clicks" Mobility  ?Outcome Measure Help needed turning from your back to your side while in a flat bed without using bedrails?: A Little ?Help needed moving from lying on your back to sitting on the side of a flat bed without using bedrails?: A Lot ?Help needed moving to and from a bed to a chair  (including a wheelchair)?: A Little ?Help needed standing up from a chair using your arms (e.g., wheelchair or bedside chair)?: A Little ?Help needed to walk in hospital room?: A Little ?Help needed climbing 3-5 steps with a railing? : A Lot ?6 Click Score: 16 ? ?  ?End of Session Equipment Utilized During Treatment: Gait belt ?Activity Tolerance: Patient tolerated treatment well ?Patient left: in chair;with call bell/phone within reach;with family/visitor present ?Nurse Communication: Mobility status ?PT Visit Diagnosis: Difficulty in walking, not elsewhere classified (R26.2);Pain ?Pain - Right/Left: Right ?Pain - part of body: Hip ?  ? ?Time: 4235-3614 ?PT Time Calculation (min) (ACUTE ONLY): 26 min ? ? ?Charges:   PT Evaluation ?$PT Eval Moderate Complexity: 1 Mod ?PT Treatments ?$Gait Training: 8-22 mins ?  ?   ? ?Blondell Reveal Kistler PT 05/15/2021  ?Acute Rehabilitation Services ?Pager [phone] ?Office [phone] ? ? ?

## 2021-05-15 NOTE — Progress Notes (Signed)
Patient vomited twice this shaft and stated that Tylenol makes her sick with an upset stomach.  ?

## 2021-05-15 NOTE — Plan of Care (Signed)
  Problem: Activity: Goal: Risk for activity intolerance will decrease Outcome: Progressing   Problem: Pain Managment: Goal: General experience of comfort will improve Outcome: Progressing   Problem: Safety: Goal: Ability to remain free from injury will improve Outcome: Progressing   

## 2021-05-15 NOTE — TOC Transition Note (Signed)
Transition of Care (TOC) - CM/SW Discharge Note ? ?Patient Details  ?Name: [name] ?MRN: [id number] ?Date of Birth: [birth year] ? ?Transition of Care (TOC) CM/SW Contact:  ?Sherie Don, LCSW ?Phone Number: ?05/15/2021, 9:50 AM ? ?Clinical Narrative: Patient is expected to discharge home after working with PT. CSW met with patient to confirm discharge plan. Patient will go home with a home exercise program (HEP). Patient has a rolling walker and shower chair at home, so there are no DME needs at this time. TOC signing off. ? ?Final next level of care: Home/Self Care ?Barriers to Discharge: No Barriers Identified ? ?Patient Goals and CMS Choice ?Patient states their goals for this hospitalization and ongoing recovery are:: Discharge home with HEP ?Choice offered to / list presented to : NA ? ?Discharge Plan and Services        ?DME Arranged: N/A ?DME Agency: NA ? ?Readmission Risk Interventions ?   ? View : No data to display.  ?  ?  ?  ? ?

## 2021-05-15 NOTE — Progress Notes (Signed)
? ?Subjective: ?1 Day Post-Op Procedure(s) (LRB): ?TOTAL HIP ARTHROPLASTY ANTERIOR APPROACH (Right) ?Patient reports pain as moderate.   ?Patient seen in rounds with Dr. Alvan Dame. ?Patient is resting in bed on exam this morning with her husband at the bedside. She had a difficult time with pain last night, but did feel the Dilaudid was helpful.  ?We will start therapy today.  ? ?Objective: ?Vital signs in last 24 hours: ?Temp:  [97.6 ?F (36.4 ?C)-99 ?F (37.2 ?C)] 97.9 ?F (36.6 ?C) (05/10 0551) ?Pulse Rate:  [48-71] 68 (05/10 0551) ?Resp:  [11-20] 17 (05/10 0551) ?BP: (114-152)/(61-86) 114/61 (05/10 0551) ?SpO2:  [91 %-100 %] 100 % (05/10 0551) ? ?Intake/Output from previous day: ? ?Intake/Output Summary (Last 24 hours) at 05/15/2021 0730 ?Last data filed at 05/15/2021 1700 ?Gross per 24 hour  ?Intake 2274.96 ml  ?Output 1551 ml  ?Net 723.96 ml  ?  ? ?Intake/Output this shift: ?No intake/output data recorded. ? ?Labs: ?Recent Labs  ?  05/15/21 ?0339  ?HGB 13.0  ? ?Recent Labs  ?  05/15/21 ?0339  ?WBC 15.8*  ?RBC 3.87  ?HCT 38.9  ?PLT 313  ? ?Recent Labs  ?  05/15/21 ?0339  ?NA 137  ?K 4.3  ?CL 102  ?CO2 25  ?BUN 12  ?CREATININE 0.91  ?GLUCOSE 154*  ?CALCIUM 9.0  ? ?No results for input(s): LABPT, INR in the last 72 hours. ? ?Exam: ?General - Patient is Alert and Oriented ?Extremity - Neurologically intact ?Sensation intact distally ?Intact pulses distally ?Dorsiflexion/Plantar flexion intact ?Dressing - dressing C/D/I ?Motor Function - intact, moving foot and toes well on exam.  ? ?Past Medical History:  ?Diagnosis Date  ? Allergic rhinitis 04/03/2009  ? Anemia   ? hx  ? Arthritis   ? Asthma, mild, intermittent 01/23/2009  ? Qualifier: Diagnosis of  By: Jerold Coombe - denies  ? Back pain 08/03/2012  ? 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 Chronic diastolic CHF (congestive heart failure) ([place]) 07/20/2015  ? Depression, recurrent (Home) 03/03/2007  ? Qualifier: Diagnosis of  By: Nils Pyle CMA (AAMA), Mearl Latin   -denies  ? Difficult intubation   ? 04/09/04 with difficult intubation. unabale to pass ETT with Sabra Heck 2; unable to visualize cord with MAC 3; intubating LMA used; uneventful intubation using Glidescope 2016, 2022  ? Diverticulosis, with history of diverticulitis of descending and sigmoid colon 03/03/2007  ? Dysrhythmia   ? atrial fib  ? Edema of both lower legs   ? Essential hypertension 06/25/2006  ? Qualifier: Diagnosis of  By: Tiney Rouge CMA, Ellison Hughs    ? Fatty liver   ? Gastritis   ? GERD 03/03/2007  ? Qualifier: Diagnosis of  By: Nils Pyle CMA (AAMA), Mearl Latin   Qualifier: Diagnosis of  By: Shane Crutch, [name]  pt denies at preop of 02/15/20   ? Hepatic steatosis, found on CT 03/03/2007  ?    ? Hiatal hernia, small 03/28/2016  ? EGD by [facility] 12/09/12.   ? Hip pain   ? History of colonic polyps 04/21/2008  ? Last Colonoscopy was 12/09/12, Dr. Fuller Plan. No polyp at that time. Repeat in 5 years.   ? Internal hemorrhoids 03/03/2007  ? Last Colonoscopy was 12/09/12, Dr. Fuller Plan. Repeat in 5 years.   ? Joint pain   ? Lactose intolerance   ? Left adrenal mass ([place]) 05/12/2013  ? Menopausal vasomotor syndrome 03/28/2016  ? Morbid obesity ([place]) (BMI 35 plus 2 comorbidities) 03/06/2016  ? Comorbid conditions include HTN, HLD, hepatic  steatosis, OA, GERD, OSA.  ? OSA (obstructive sleep apnea) 03/03/2007  ? cpap   ? PAF (paroxysmal atrial fibrillation) ([place]) 04/24/2015  ? Palpitations   ? PONV (postoperative nausea and vomiting)   ? area on left forearm DO NOT PLACE IV OR STICK ? vascular"will bleed"  ? Umbilical hernia without obstruction or gangrene, fat containing 03/28/2016  ? Found on CT, 2017. No issues.  ? ? ?Assessment/Plan: ?1 Day Post-Op Procedure(s) (LRB): ?TOTAL HIP ARTHROPLASTY ANTERIOR APPROACH (Right) ?Principal Problem: ?  S/P total right hip arthroplasty ? ?Estimated body mass index is 35.78 kg/m? as calculated from the following: ?  Height as of this encounter: _0  (1.651 m). ?  Weight as of this encounter: 97.5  kg. ?Advance diet ?Up with therapy ?D/C IV fluids ? ?DVT Prophylaxis -  Eliquis ?Weight bearing as tolerated. ? ?Hgb stable at 13.0 this AM ? ?Plan is to go Home after hospital stay. Plan for discharge today after meeting goals with therapy. Follow up in the office in 2 weeks.  ? ?Griffith Citron, PA-C ?Orthopedic Surgery ?[phone] ?05/15/2021, 7:30 AM  ?

## 2021-05-16 DIAGNOSIS — M1611 Unilateral primary osteoarthritis, right hip: Secondary | ICD-10-CM | POA: Diagnosis not present

## 2021-05-16 LAB — CBC
HCT: 35 % — ABNORMAL LOW (ref 36.0–46.0)
Hemoglobin: 11.8 g/dL — ABNORMAL LOW (ref 12.0–15.0)
MCH: 32.8 pg (ref 26.0–34.0)
MCHC: 33.7 g/dL (ref 30.0–36.0)
MCV: 97.2 fL (ref 80.0–100.0)
Platelets: 295 10*3/uL (ref 150–400)
RBC: 3.6 MIL/uL — ABNORMAL LOW (ref 3.87–5.11)
RDW: 12.7 % (ref 11.5–15.5)
WBC: 20.1 10*3/uL — ABNORMAL HIGH (ref 4.0–10.5)
nRBC: 0 % (ref 0.0–0.2)

## 2021-05-16 NOTE — Progress Notes (Signed)
Physical Therapy Treatment ?Patient Details ?Name: [name] ?MRN: [id number] ?DOB: Jul 03, 1955 ?Today's Date: 05/16/2021 ? ? ?History of Present Illness Pt is a 66yo female presenting s/p R-THA, AA on 05/14/21. PMH: OA, ack pain, CHF, depression, HTN, GERD, OSA on CPAP, PAF, lumbar fusion 2016, L-THA 2022, cervical fusion ? ?  ?PT Comments  ? ? Pt ambulated 25' x 2 with seated rest break 2* nausea/overheated with walking. Pt assisted to recliner where vital signs were stable. Nausea and feeling of being overheated resolved with seated rest. Stair training completed. Pt/spouse demonstrate good understanding of HEP. Will plan on second session to progress ambulation distance.  ?   ?Recommendations for follow up therapy are one component of a multi-disciplinary discharge planning process, led by the attending physician.  Recommendations may be updated based on patient status, additional functional criteria and insurance authorization. ? ?Follow Up Recommendations ? Follow physician's recommendations for discharge plan and follow up therapies ?  ?  ?Assistance Recommended at Discharge Intermittent Supervision/Assistance  ?Patient can return home with the following A little help with bathing/dressing/bathroom;Assistance with cooking/housework;Assist for transportation;Help with stairs or ramp for entrance ?  ?Equipment Recommendations ? None recommended by PT  ?  ?Recommendations for Other Services   ? ? ?  ?Precautions / Restrictions Precautions ?Precautions: Fall ?Precaution Comments: pt denies falls in past 6 months ?Restrictions ?Weight Bearing Restrictions: No  ?  ? ?Mobility ? Bed Mobility ?Overal bed mobility: Needs Assistance ?Bed Mobility: Supine to Sit ?  ?  ?Supine to sit: Min assist ?  ?  ?General bed mobility comments: assist for RLE out of bed ?  ? ?Transfers ?Overall transfer level: Needs assistance ?Equipment used: Rolling walker (2 wheels) ?Transfers: Sit to/from Stand ?Sit to Stand: From elevated  surface, Min assist ?  ?  ?  ?  ?  ?General transfer comment: VCs hand placement, assist to power up ?  ? ?Ambulation/Gait ?Ambulation/Gait assistance: Min guard ?Gait Distance (Feet): 25 Feet (25' x 2) ?Assistive device: Rolling walker (2 wheels) ?Gait Pattern/deviations: Step-to pattern, Decreased step length - right ?Gait velocity: decr ?  ?  ?General Gait Details: able to advance RLE without assist,  distance limited by pain, no loss of balance, 25' x 2 with seated rest break 2* nausea/overheating, VSS ? ? ?Stairs ?Stairs: Yes ?Stairs assistance: Min assist ?Stair Management: No rails, Backwards, With walker ?Number of Stairs: 1 ?General stair comments: husband present, VCs sequencing, min A to steady RW ? ? ?Wheelchair Mobility ?  ? ?Modified Rankin (Stroke Patients Only) ?  ? ? ?  ?Balance Overall balance assessment: Modified Independent ?  ?  ?  ?  ?  ?  ?  ?  ?  ?  ?  ?  ?  ?  ?  ?  ?  ?  ?  ? ?  ?Cognition Arousal/Alertness: Awake/alert ?Behavior During Therapy: [facility] for tasks assessed/performed ?Overall Cognitive Status: Within Functional Limits for tasks assessed ?  ?  ?  ?  ?  ?  ?  ?  ?  ?  ?  ?  ?  ?  ?  ?  ?  ?  ?  ? ?  ?Exercises Total Joint Exercises ?Ankle Circles/Pumps: AROM, 10 reps, Supine ?Quad Sets: AROM, Right, 5 reps, Supine ?Short Arc Quad: AROM, Right, 5 reps, Supine ?Heel Slides: AAROM, Right, Supine, 5 reps ?Hip ABduction/ADduction: AAROM, Right, Supine, 5 reps ?Long Arc Quad: AROM, Right, 5 reps, Seated ? ?  ?  General Comments   ?  ?  ? ?Pertinent Vitals/Pain Pain Assessment ?Pain Score: 8  ?Pain Location: R hip with walking ?Pain Descriptors / Indicators: Heaviness, Burning ?Pain Intervention(s): Limited activity within patient's tolerance, Monitored during session, Premedicated before session, Ice applied  ? ? ?Home Living   ?  ?  ?  ?  ?  ?  ?  ?  ?  ?   ?  ?Prior Function    ?  ?  ?   ? ?PT Goals (current goals can now be found in the care plan section) Acute Rehab PT Goals ?Patient  Stated Goal: travel to [place] ?PT Goal Formulation: With patient/family ?Time For Goal Achievement: 05/22/21 ?Potential to Achieve Goals: Good ? ?  ?Frequency ? ? ? 7X/week ? ? ? ?  ?PT Plan Current plan remains appropriate  ? ? ?Co-evaluation   ?  ?  ?  ?  ? ?  ?AM-PAC PT "6 Clicks" Mobility   ?Outcome Measure ? Help needed turning from your back to your side while in a flat bed without using bedrails?: A Little ?Help needed moving from lying on your back to sitting on the side of a flat bed without using bedrails?: A Lot ?Help needed moving to and from a bed to a chair (including a wheelchair)?: A Little ?Help needed standing up from a chair using your arms (e.g., wheelchair or bedside chair)?: A Little ?Help needed to walk in hospital room?: A Little ?Help needed climbing 3-5 steps with a railing? : A Lot ?6 Click Score: 16 ? ?  ?End of Session Equipment Utilized During Treatment: Gait belt ?Activity Tolerance: Patient limited by pain ?Patient left: with call bell/phone within reach;in chair;with family/visitor present ?Nurse Communication: Mobility status ?PT Visit Diagnosis: Difficulty in walking, not elsewhere classified (R26.2);Pain ?Pain - Right/Left: Right ?Pain - part of body: Hip ?  ? ? ?Time: 9794-8016 ?PT Time Calculation (min) (ACUTE ONLY): 43 min ? ?Charges:  $Gait Training: 8-22 mins ?$Therapeutic Exercise: 8-22 mins ?$Therapeutic Activity: 8-22 mins          ?          ?Blondell Reveal Kistler PT 05/16/2021  ?Acute Rehabilitation Services ?Pager [phone] ?Office [phone] ? ? ?

## 2021-05-16 NOTE — Progress Notes (Signed)
? ?Subjective: ?2 Days Post-Op Procedure(s) (LRB): ?TOTAL HIP ARTHROPLASTY ANTERIOR APPROACH (Right) ?Patient reports pain as moderate.   ?Patient seen in rounds for Dr. Alvan Dame. ?Patient is resting in bed with husband at the bedside this morning. She states her day yesterday was rough due to pain, but she is motivated to get home today. Ambulated 35 feet with PT yesterday. ?We will continue therapy today.  ? ?Objective: ?Vital signs in last 24 hours: ?Temp:  [98 ?F (36.7 ?C)-99.1 ?F (37.3 ?C)] 98.6 ?F (37 ?C) (05/11 0524) ?Pulse Rate:  [59-70] 59 (05/11 0524) ?Resp:  [17-20] 17 (05/11 0524) ?BP: (135-154)/(72-75) 142/73 (05/11 0524) ?SpO2:  [94 %-98 %] 96 % (05/11 0524) ? ?Intake/Output from previous day: ? ?Intake/Output Summary (Last 24 hours) at 05/16/2021 0928 ?Last data filed at 05/16/2021 0608 ?Gross per 24 hour  ?Intake 1420.46 ml  ?Output 2600 ml  ?Net -1179.54 ml  ?  ? ?Intake/Output this shift: ?No intake/output data recorded. ? ?Labs: ?Recent Labs  ?  05/15/21 ?6381 05/16/21 ?0329  ?HGB 13.0 11.8*  ? ?Recent Labs  ?  05/15/21 ?7711 05/16/21 ?0329  ?WBC 15.8* 20.1*  ?RBC 3.87 3.60*  ?HCT 38.9 35.0*  ?PLT 313 295  ? ?Recent Labs  ?  05/15/21 ?0339  ?NA 137  ?K 4.3  ?CL 102  ?CO2 25  ?BUN 12  ?CREATININE 0.91  ?GLUCOSE 154*  ?CALCIUM 9.0  ? ?No results for input(s): LABPT, INR in the last 72 hours. ? ?Exam: ?General - Patient is Alert and Oriented ?Extremity - Neurologically intact ?Sensation intact distally ?Intact pulses distally ?Dorsiflexion/Plantar flexion intact ?Dressing - dressing C/D/I ?Motor Function - intact, moving foot and toes well on exam.  ? ?Past Medical History:  ?Diagnosis Date  ? Allergic rhinitis 04/03/2009  ? Anemia   ? hx  ? Arthritis   ? Asthma, mild, intermittent 01/23/2009  ? Qualifier: Diagnosis of  By: Jerold Coombe - denies  ? Back pain 08/03/2012  ? 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 Chronic diastolic CHF (congestive heart failure) ([place]) 07/20/2015  ?  Depression, recurrent ([place]) 03/03/2007  ? Qualifier: Diagnosis of  By: Nils Pyle CMA (AAMA), Mearl Latin  -denies  ? Difficult intubation   ? 04/09/04 with difficult intubation. unabale to pass ETT with Sabra Heck 2; unable to visualize cord with MAC 3; intubating LMA used; uneventful intubation using Glidescope 2016, 2022  ? Diverticulosis, with history of diverticulitis of descending and sigmoid colon 03/03/2007  ? Dysrhythmia   ? atrial fib  ? Edema of both lower legs   ? Essential hypertension 06/25/2006  ? Qualifier: Diagnosis of  By: Tiney Rouge CMA, Ellison Hughs    ? Fatty liver   ? Gastritis   ? GERD 03/03/2007  ? Qualifier: Diagnosis of  By: Nils Pyle CMA (AAMA), Mearl Latin   Qualifier: Diagnosis of  By: Shane Crutch, [name]  pt denies at preop of 02/15/20   ? Hepatic steatosis, found on CT 03/03/2007  ?    ? Hiatal hernia, small 03/28/2016  ? EGD by [facility] 12/09/12.   ? Hip pain   ? History of colonic polyps 04/21/2008  ? Last Colonoscopy was 12/09/12, Dr. Fuller Plan. No polyp at that time. Repeat in 5 years.   ? Internal hemorrhoids 03/03/2007  ? Last Colonoscopy was 12/09/12, Dr. Fuller Plan. Repeat in 5 years.   ? Joint pain   ? Lactose intolerance   ? Left adrenal mass ([place]) 05/12/2013  ? Menopausal vasomotor syndrome 03/28/2016  ? Morbid obesity ([place]) (BMI  35 plus 2 comorbidities) 03/06/2016  ? Comorbid conditions include HTN, HLD, hepatic steatosis, OA, GERD, OSA.  ? OSA (obstructive sleep apnea) 03/03/2007  ? cpap   ? PAF (paroxysmal atrial fibrillation) ([place]) 04/24/2015  ? Palpitations   ? PONV (postoperative nausea and vomiting)   ? area on left forearm DO NOT PLACE IV OR STICK ? vascular"will bleed"  ? Umbilical hernia without obstruction or gangrene, fat containing 03/28/2016  ? Found on CT, 2017. No issues.  ? ? ?Assessment/Plan: ?2 Days Post-Op Procedure(s) (LRB): ?TOTAL HIP ARTHROPLASTY ANTERIOR APPROACH (Right) ?Principal Problem: ?  S/P total right hip arthroplasty ? ?Estimated body mass index is 35.78 kg/m? as calculated from the  following: ?  Height as of this encounter: _0  (1.651 m). ?  Weight as of this encounter: 97.5 kg. ?Advance diet ?Up with therapy ?D/C IV fluids ? ?DVT Prophylaxis -  Eliquis ?Weight bearing as tolerated. ? ?Hgb stable at 11.8 this AM. ? ?Plan is to go Home after hospital stay. Plan for discharge today after meeting goals with therapy. Follow up in the office in 2 weeks.  ? ?Griffith Citron, PA-C ?Orthopedic Surgery ?[phone] ?05/16/2021, 9:28 AM  ?

## 2021-05-16 NOTE — Progress Notes (Signed)
Physical Therapy Treatment ?Patient Details ?Name: [name] ?MRN: [id number] ?DOB: 09/07/1955 ?Today's Date: 05/16/2021 ? ? ?History of Present Illness Pt is a 66yo female presenting s/p R-THA, AA on 05/14/21. PMH: OA, ack pain, CHF, depression, HTN, GERD, OSA on CPAP, PAF, lumbar fusion 2016, L-THA 2022, cervical fusion ? ?  ?PT Comments  ? ? Pt tolerated significant increase in ambulation distance of 29' with RW, no loss of balance. No nausea nor overheating this session. She is ready to DC home from a PT standpoint.    ?Recommendations for follow up therapy are one component of a multi-disciplinary discharge planning process, led by the attending physician.  Recommendations may be updated based on patient status, additional functional criteria and insurance authorization. ? ?Follow Up Recommendations ? Follow physician's recommendations for discharge plan and follow up therapies ?  ?  ?Assistance Recommended at Discharge Intermittent Supervision/Assistance  ?Patient can return home with the following A little help with bathing/dressing/bathroom;Assistance with cooking/housework;Assist for transportation;Help with stairs or ramp for entrance ?  ?Equipment Recommendations ? None recommended by PT  ?  ?Recommendations for Other Services   ? ? ?  ?Precautions / Restrictions Precautions ?Precautions: Fall ?Precaution Comments: pt denies falls in past 6 months ?Restrictions ?Weight Bearing Restrictions: No  ?  ? ?Mobility ? Bed Mobility ?Overal bed mobility: Needs Assistance ?Bed Mobility: Supine to Sit ?  ?  ?Supine to sit: Min assist ?  ?  ?General bed mobility comments: up in recliner ?  ? ?Transfers ?Overall transfer level: Needs assistance ?Equipment used: Rolling walker (2 wheels) ?Transfers: Sit to/from Stand ?Sit to Stand: From elevated surface, Supervision ?  ?  ?  ?  ?  ?General transfer comment: no assist needed, good hand placement/safety awareness ?  ? ?Ambulation/Gait ?Ambulation/Gait assistance: Min  guard ?Gait Distance (Feet): 80 Feet ?Assistive device: Rolling walker (2 wheels) ?Gait Pattern/deviations: Step-to pattern, Decreased step length - right ?Gait velocity: decr ?  ?  ?General Gait Details: able to advance RLE without assist,  distance limited by fatigue ? ? ?Stairs ?Stairs: Yes ?Stairs assistance: Min assist ?Stair Management: No rails, Backwards, With walker ?Number of Stairs: 1 ?General stair comments: husband present, VCs sequencing, min A to steady RW ? ? ?Wheelchair Mobility ?  ? ?Modified Rankin (Stroke Patients Only) ?  ? ? ?  ?Balance Overall balance assessment: Modified Independent ?  ?  ?  ?  ?  ?  ?  ?  ?  ?  ?  ?  ?  ?  ?  ?  ?  ?  ?  ? ?  ?Cognition Arousal/Alertness: Awake/alert ?Behavior During Therapy: [facility] for tasks assessed/performed ?Overall Cognitive Status: Within Functional Limits for tasks assessed ?  ?  ?  ?  ?  ?  ?  ?  ?  ?  ?  ?  ?  ?  ?  ?  ?  ?  ?  ? ?  ?Exercises Total Joint Exercises ?Ankle Circles/Pumps: AROM, 10 reps, Supine ?Quad Sets: AROM, Right, 5 reps, Supine ?Short Arc Quad: AROM, Right, 5 reps, Supine ?Heel Slides: AAROM, Right, Supine, 5 reps ?Hip ABduction/ADduction: AAROM, Right, Supine, 5 reps ?Long Arc Quad: AROM, Right, 5 reps, Seated ? ?  ?General Comments   ?  ?  ? ?Pertinent Vitals/Pain Pain Assessment ?Pain Score: 6  ?Pain Location: R hip with walking ?Pain Descriptors / Indicators: Heaviness, Burning ?Pain Intervention(s): Limited activity within patient's tolerance, Monitored during session, Premedicated before  session, Ice applied  ? ? ?Home Living   ?  ?  ?  ?  ?  ?  ?  ?  ?  ?   ?  ?Prior Function    ?  ?  ?   ? ?PT Goals (current goals can now be found in the care plan section) Acute Rehab PT Goals ?Patient Stated Goal: travel to [place] ?PT Goal Formulation: With patient/family ?Time For Goal Achievement: 05/22/21 ?Potential to Achieve Goals: Good ?Progress towards PT goals: Progressing toward goals ? ?  ?Frequency ? ? ? 7X/week ? ? ? ?  ?PT Plan  Current plan remains appropriate  ? ? ?Co-evaluation   ?  ?  ?  ?  ? ?  ?AM-PAC PT "6 Clicks" Mobility   ?Outcome Measure ? Help needed turning from your back to your side while in a flat bed without using bedrails?: A Little ?Help needed moving from lying on your back to sitting on the side of a flat bed without using bedrails?: A Little ?Help needed moving to and from a bed to a chair (including a wheelchair)?: None ?Help needed standing up from a chair using your arms (e.g., wheelchair or bedside chair)?: None ?Help needed to walk in hospital room?: None ?Help needed climbing 3-5 steps with a railing? : A Little ?6 Click Score: 21 ? ?  ?End of Session Equipment Utilized During Treatment: Gait belt ?Activity Tolerance: Patient limited by pain ?Patient left: with call bell/phone within reach;in chair;with family/visitor present ?Nurse Communication: Mobility status ?PT Visit Diagnosis: Difficulty in walking, not elsewhere classified (R26.2);Pain ?Pain - Right/Left: Right ?Pain - part of body: Hip ?  ? ? ?Time: 6381-7711 ?PT Time Calculation (min) (ACUTE ONLY): 26 min ? ?Charges:  $Gait Training: 23-37 mins          ?          ?Blondell Reveal Kistler PT 05/16/2021  ?Acute Rehabilitation Services ?Pager [phone] ?Office [phone] ? ? ?

## 2021-05-22 ENCOUNTER — Other Ambulatory Visit (HOSPITAL_COMMUNITY)

## 2021-05-23 NOTE — Discharge Summary (Signed)
Physician Discharge Summary   Patient ID: [name] MRN: [id number] DOB/AGE: Sep 14, 1955 66 y.o.  Admit date: 05/14/2021 Discharge date: 05/16/2021  Primary Diagnosis: Right  hip osteoarthritis.   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 unabale to pass ETT with Sabra Heck 2; unable to visualize cord with MAC 3; intubating LMA used; uneventful intubation using Glidescope 2016, 2022   Diverticulosis, with history of diverticulitis of descending and sigmoid colon 03/03/2007   Dysrhythmia    atrial fib   Edema of both lower legs    Essential hypertension 06/25/2006   Qualifier: Diagnosis of  By: Tiney Rouge CMA, Ellison Hughs     Fatty liver    Gastritis    GERD 03/03/2007   Qualifier: Diagnosis of  By: Nils Pyle CMA (AAMA), Mearl Latin   Qualifier: Diagnosis of  By: Shane Crutch, [name]  pt denies at preop of 02/15/20    Hepatic steatosis, found on CT 03/03/2007       Hiatal hernia, small 03/28/2016   EGD by [facility] 12/09/12.    Hip pain    History of colonic polyps 04/21/2008   Last Colonoscopy was 12/09/12, Dr. Fuller Plan. No polyp at that time. Repeat in 5 years.    Internal hemorrhoids 03/03/2007   Last Colonoscopy was 12/09/12, Dr. Fuller Plan. Repeat in 5 years.    Joint pain    Lactose intolerance    Left adrenal mass ([place]) 05/12/2013   Menopausal vasomotor syndrome 03/28/2016   Morbid obesity ([place]) (BMI 35 plus 2 comorbidities) 03/06/2016   Comorbid conditions include HTN, HLD, hepatic steatosis, OA, GERD, OSA.   OSA  (obstructive sleep apnea) 03/03/2007   cpap    PAF (paroxysmal atrial fibrillation) ([place]) 04/24/2015   Palpitations    PONV (postoperative nausea and vomiting)    area on left forearm DO NOT PLACE IV OR STICK ? vascular"will bleed"   Umbilical hernia without obstruction or gangrene, fat containing 03/28/2016   Found on CT, 2017. No issues.   Discharge Diagnoses:   Principal Problem:   S/P total right hip arthroplasty  Estimated body mass index is 35.78 kg/m as calculated from the following:   Height as of this encounter: _0  (1.651 m).   Weight as of this encounter: 97.5 kg.  Procedure:  Procedure(s) (LRB): TOTAL HIP ARTHROPLASTY ANTERIOR APPROACH (Right)   Consults: None  HPI:  [name] is a 66 y.o. female who had   presented to office for evaluation of right hip pain.  Radiographs revealed   progressive degenerative changes with bone-on-bone   articulation of the  hip joint, including subchondral cystic changes and osteophytes.  The patient had painful limited range of   motion significantly affecting their overall quality of life and function.  The patient was failing to    respond to conservative measures including medications and/or injections and activity modification and at this point was ready   to proceed with more definitive measures.  Consent was obtained for   benefit of pain relief.  Specific risks of infection, DVT, component   failure, dislocation, neurovascular injury, and need for revision surgery were reviewed in the office.  Laboratory Data: Admission on 05/14/2021, Discharged on 05/16/2021  Component Date Value Ref Range Status   ABO/RH(D) 05/14/2021    Final                   Value:A POS Performed at [facility], [place] [address] [place], [place] [address]    WBC 05/15/2021 15.8 (H)  4.0 - 10.5 K/uL Final   RBC 05/15/2021 3.87  3.87 - 5.11 MIL/uL Final   Hemoglobin 05/15/2021 13.0  12.0 - 15.0 g/dL Final   HCT 05/15/2021  38.9  36.0 - 46.0 % Final   MCV 05/15/2021 100.5 (H)  80.0 - 100.0 fL Final   MCH 05/15/2021 33.6  26.0 - 34.0 pg Final   MCHC 05/15/2021 33.4  30.0 - 36.0 g/dL Final   RDW 05/15/2021 12.6  11.5 - 15.5 % Final   Platelets 05/15/2021 313  150 - 400 K/uL Final   nRBC 05/15/2021 0.0  0.0 - 0.2 % Final   Performed at [facility], [place] [address], [place], [place] [address]   Sodium 05/15/2021 137  135 - 145 mmol/L Final   Potassium 05/15/2021 4.3  3.5 - 5.1 mmol/L Final   Chloride 05/15/2021 102  98 - 111 mmol/L Final   CO2 05/15/2021 25  22 - 32 mmol/L Final   Glucose, Bld 05/15/2021 154 (H)  70 - 99 mg/dL Final   Glucose reference range applies only to samples taken after fasting for at least 8 hours.   BUN 05/15/2021 12  8 - 23 mg/dL Final   Creatinine, Ser 05/15/2021 0.91  0.44 - 1.00 mg/dL Final   Calcium 05/15/2021 9.0  8.9 - 10.3 mg/dL Final   GFR, Estimated 05/15/2021 >60  >60 mL/min Final   Comment: (NOTE) Calculated using the CKD-EPI Creatinine Equation (2021)    Anion gap 05/15/2021 10  5 - 15 Final   Performed at [facility], [place] [address] [place], [place] [address]   WBC 05/16/2021 20.1 (H)  4.0 - 10.5 K/uL Final   RBC 05/16/2021 3.60 (L)  3.87 - 5.11 MIL/uL Final   Hemoglobin 05/16/2021 11.8 (L)  12.0 - 15.0 g/dL Final   HCT 05/16/2021 35.0 (L)  36.0 - 46.0 % Final   MCV 05/16/2021 97.2  80.0 - 100.0 fL Final   MCH 05/16/2021 32.8  26.0 - 34.0 pg Final   MCHC 05/16/2021 33.7  30.0 - 36.0 g/dL Final   RDW 05/16/2021 12.7  11.5 - 15.5 % Final   Platelets 05/16/2021 295  150 - 400 K/uL Final   nRBC 05/16/2021 0.0  0.0 - 0.2 % Final   Performed at [facility], [name] [address] [place], [place] [address]  Admission on 05/05/2021, Discharged on 05/05/2021  Component Date Value Ref Range Status   WBC 05/05/2021 5.0  4.0 - 10.5 K/uL Final   RBC 05/05/2021 4.46  3.87 - 5.11 MIL/uL Final   Hemoglobin 05/05/2021 14.5   12.0 - 15.0 g/dL Final   HCT 05/05/2021 43.7  36.0 - 46.0 % Final   MCV 05/05/2021 98.0  80.0 - 100.0 fL Final   MCH 05/05/2021 32.5  26.0 - 34.0 pg Final   MCHC 05/05/2021 33.2  30.0 - 36.0 g/dL Final   RDW 05/05/2021 12.6  11.5 - 15.5 % Final   Platelets 05/05/2021  359  150 - 400 K/uL Final   nRBC 05/05/2021 0.0  0.0 - 0.2 % Final   Neutrophils Relative % 05/05/2021 34  % Final   Neutro Abs 05/05/2021 1.7  1.7 - 7.7 K/uL Final   Lymphocytes Relative 05/05/2021 50  % Final   Lymphs Abs 05/05/2021 2.5  0.7 - 4.0 K/uL Final   Monocytes Relative 05/05/2021 12  % Final   Monocytes Absolute 05/05/2021 0.6  0.1 - 1.0 K/uL Final   Eosinophils Relative 05/05/2021 3  % Final   Eosinophils Absolute 05/05/2021 0.2  0.0 - 0.5 K/uL Final   Basophils Relative 05/05/2021 1  % Final   Basophils Absolute 05/05/2021 0.0  0.0 - 0.1 K/uL Final   Immature Granulocytes 05/05/2021 0  % Final   Abs Immature Granulocytes 05/05/2021 0.01  0.00 - 0.07 K/uL Final   Performed at [facility], [place] [address] [place], [place] [address]   Sodium 05/05/2021 140  135 - 145 mmol/L Final   Potassium 05/05/2021 3.8  3.5 - 5.1 mmol/L Final   Chloride 05/05/2021 109  98 - 111 mmol/L Final   CO2 05/05/2021 25  22 - 32 mmol/L Final   Glucose, Bld 05/05/2021 107 (H)  70 - 99 mg/dL Final   Glucose reference range applies only to samples taken after fasting for at least 8 hours.   BUN 05/05/2021 9  8 - 23 mg/dL Final   Creatinine, Ser 05/05/2021 0.70  0.44 - 1.00 mg/dL Final   Calcium 05/05/2021 9.4  8.9 - 10.3 mg/dL Final   Total Protein 05/05/2021 8.1  6.5 - 8.1 g/dL Final   Albumin 05/05/2021 4.0  3.5 - 5.0 g/dL Final   AST 05/05/2021 19  15 - 41 U/L Final   ALT 05/05/2021 20  0 - 44 U/L Final   Alkaline Phosphatase 05/05/2021 64  38 - 126 U/L Final   Total Bilirubin 05/05/2021 0.7  0.3 - 1.2 mg/dL Final   GFR, Estimated 05/05/2021 >60  >60 mL/min Final   Comment: (NOTE) Calculated using the  CKD-EPI Creatinine Equation (2021)    Anion gap 05/05/2021 6  5 - 15 Final   Performed at [facility], [place] [address], [place], [place] [address]   Lipase 05/05/2021 32  11 - 51 U/L Final   Performed at [facility], [place] [address] [place], [place] [address]   Color, Urine 05/05/2021 YELLOW  YELLOW Final   APPearance 05/05/2021 CLEAR  CLEAR Final   Specific Gravity, Urine 05/05/2021 1.010  1.005 - 1.030 Final   pH 05/05/2021 7.0  5.0 - 8.0 Final   Glucose, UA 05/05/2021 NEGATIVE  NEGATIVE mg/dL Final   Hgb urine dipstick 05/05/2021 NEGATIVE  NEGATIVE Final   Bilirubin Urine 05/05/2021 NEGATIVE  NEGATIVE Final   Ketones, ur 05/05/2021 NEGATIVE  NEGATIVE mg/dL Final   Protein, ur 05/05/2021 NEGATIVE  NEGATIVE mg/dL Final   Nitrite 05/05/2021 NEGATIVE  NEGATIVE Final   Leukocytes,Ua 05/05/2021 NEGATIVE  NEGATIVE Final   Performed at [facility], [place] [address], [place], [place] [address]  Hospital Outpatient Visit on 05/02/2021  Component Date Value Ref Range Status   MRSA, PCR 05/02/2021 NEGATIVE  NEGATIVE Final   Staphylococcus aureus 05/02/2021 NEGATIVE  NEGATIVE Final   Comment: (NOTE) The Xpert SA Assay (FDA approved for NASAL specimens in patients 40 years of age and older), is one component of a comprehensive surveillance program. It is not intended to diagnose infection nor  to guide or monitor treatment. Performed at [facility], [place] [address] [place], [place] [address]    ABO/RH(D) 05/02/2021 A POS   Final   Antibody Screen 05/02/2021 NEG   Final   Sample Expiration 05/02/2021 05/16/2021,2359   Final   Extend sample reason 05/02/2021    Final                   Value:NO TRANSFUSIONS OR PREGNANCY IN THE PAST 3 MONTHS Performed at [place] [address], [place], [place] [address]    Hgb A1c MFr Bld 05/02/2021 5.5  4.8 - 5.6 % Final   Comment: (NOTE) Pre diabetes:           5.7%-6.4%  Diabetes:              >6.4%  Glycemic control for   <7.0% adults with diabetes    Mean Plasma Glucose 05/02/2021 111.15  mg/dL Final   Performed at [facility], [place] [address], [place], [place] [address]   Sodium 05/02/2021 138  135 - 145 mmol/L Final   Potassium 05/02/2021 4.1  3.5 - 5.1 mmol/L Final   Chloride 05/02/2021 104  98 - 111 mmol/L Final   CO2 05/02/2021 26  22 - 32 mmol/L Final   Glucose, Bld 05/02/2021 97  70 - 99 mg/dL Final   Glucose reference range applies only to samples taken after fasting for at least 8 hours.   BUN 05/02/2021 12  8 - 23 mg/dL Final   Creatinine, Ser 05/02/2021 0.69  0.44 - 1.00 mg/dL Final   Calcium 05/02/2021 9.2  8.9 - 10.3 mg/dL Final   Total Protein 05/02/2021 7.4  6.5 - 8.1 g/dL Final   Albumin 05/02/2021 3.9  3.5 - 5.0 g/dL Final   AST 05/02/2021 18  15 - 41 U/L Final   ALT 05/02/2021 20  0 - 44 U/L Final   Alkaline Phosphatase 05/02/2021 62  38 - 126 U/L Final   Total Bilirubin 05/02/2021 0.9  0.3 - 1.2 mg/dL Final   GFR, Estimated 05/02/2021 >60  >60 mL/min Final   Comment: (NOTE) Calculated using the CKD-EPI Creatinine Equation (2021)    Anion gap 05/02/2021 8  5 - 15 Final   Performed at [facility], [place] [address] [place], [place] [address]   WBC 05/02/2021 5.6  4.0 - 10.5 K/uL Final   RBC 05/02/2021 4.40  3.87 - 5.11 MIL/uL Final   Hemoglobin 05/02/2021 14.5  12.0 - 15.0 g/dL Final   HCT 05/02/2021 43.4  36.0 - 46.0 % Final   MCV 05/02/2021 98.6  80.0 - 100.0 fL Final   MCH 05/02/2021 33.0  26.0 - 34.0 pg Final   MCHC 05/02/2021 33.4  30.0 - 36.0 g/dL Final   RDW 05/02/2021 12.5  11.5 - 15.5 % Final   Platelets 05/02/2021 337  150 - 400 K/uL Final   nRBC 05/02/2021 0.0  0.0 - 0.2 % Final   Performed at [facility], [place] [address], [place], [place] [address]     X-Rays:CT ABDOMEN PELVIS W CONTRAST  Result Date: 05/05/2021 CLINICAL DATA:  Left lower quadrant  abdominal pain. EXAM: CT ABDOMEN AND PELVIS WITH CONTRAST TECHNIQUE: Multidetector CT imaging of the abdomen and pelvis was performed using the standard protocol following bolus administration of intravenous contrast. RADIATION DOSE REDUCTION: This exam was performed according to the departmental dose-optimization program which includes automated exposure control, adjustment of the  mA and/or kV according to patient size and/or use of iterative reconstruction technique. CONTRAST:  175m OMNIPAQUE IOHEXOL 300 MG/ML  SOLN COMPARISON:  10/19/2020 FINDINGS: Lower chest: Dependent atelectasis noted in the lung bases. Hepatobiliary: No suspicious focal abnormality within the liver parenchyma. Gallbladder surgically absent. No intrahepatic or extrahepatic biliary dilation. Pancreas: No focal mass lesion. No dilatation of the main duct. No intraparenchymal cyst. No peripancreatic edema. Spleen: No splenomegaly. No focal mass lesion. Adrenals/Urinary Tract: Right adrenal gland unremarkable. Tiny left adrenal adenoma or myelolipoma is stable. No followup recommended. Stable hypoattenuating lesions in both kidneys, most likely benign cysts. No followup recommended. No evidence for hydroureter. The urinary bladder appears normal for the degree of distention. Stomach/Bowel: Stomach is unremarkable. No gastric wall thickening. No evidence of outlet obstruction. Duodenum is normally positioned as is the ligament of Treitz. No small bowel wall thickening. No small bowel dilatation. No gross colonic mass. No colonic wall thickening. Diverticular changes are noted in the left colon without evidence of diverticulitis. Vascular/Lymphatic: No abdominal aortic aneurysm. No abdominal aortic atherosclerotic calcification. There is no gastrohepatic or hepatoduodenal ligament lymphadenopathy. No retroperitoneal or mesenteric lymphadenopathy. No pelvic sidewall lymphadenopathy. Reproductive: The uterus is surgically absent. There is no adnexal  mass. Other: No intraperitoneal free fluid. Musculoskeletal: Status post left total hip replacement. Degenerative changes noted right hip. Lower lumbar fusion hardware noted. IMPRESSION: No acute findings in the abdomen or pelvis. Specifically, no findings to explain the patient's history of left lower quadrant pain. Left colonic diverticulosis without diverticulitis. Electronically Signed   By: EMisty StanleyM.D.   On: 05/05/2021 09:16   DG Pelvis Portable  Result Date: 05/14/2021 CLINICAL DATA:  Left hip replacement EXAM: PORTABLE PELVIS 1-2 VIEWS COMPARISON:  Earlier same day FINDINGS: 1028 hours. Patient is status post bilateral total hip replacement. Gas in the soft tissues the right thighs compatible with the immediate postoperative state. No evidence for hardware complication. IMPRESSION: Status post right total hip replacement. No evidence for immediate hardware complications. Electronically Signed   By: EMisty StanleyM.D.   On: 05/14/2021 11:32   DG C-Arm 1-60 Min-No Report  Result Date: 05/14/2021 Fluoroscopy was utilized by the requesting physician.  No radiographic interpretation.   DG C-Arm 1-60 Min-No Report  Result Date: 05/14/2021 Fluoroscopy was utilized by the requesting physician.  No radiographic interpretation.   DG HIP UNILAT WITH PELVIS 1V RIGHT  Result Date: 05/14/2021 CLINICAL DATA:  Hip arthroplasty intraoperative fluoroscopy. EXAM: DG HIP (WITH OR WITHOUT PELVIS) 1V RIGHT COMPARISON:  CT abdomen and pelvis 05/05/2021 FINDINGS: Images were performed intraoperatively without the presence of a radiologist. Patient is undergoing new total right hip arthroplasty. Redemonstration of total left hip arthroplasty. Total fluoroscopy images: Total fluoroscopy time: 7 seconds Total dose: Radiation Exposure Index (as provided by the fluoroscopic device): 2.26 mGy air Kerma Please see intraoperative findings for further detail. IMPRESSION: Intraoperative fluoro for total right hip  arthroplasty. Electronically Signed   By: RYvonne KendallM.D.   On: 05/14/2021 10:29    EKG: Orders placed or performed in visit on 04/10/21   EKG 12-Lead     Hospital Course: [name] MIDGETTis a 66y.o. who was admitted to [facility] They were brought to the operating room on 05/14/2021 and underwent Procedure(s): [place]  Patient tolerated the procedure well and was later transferred to the recovery room and then to the orthopaedic floor for postoperative care. They were given PO and IV analgesics for pain  control following their surgery. They were given 24 hours of postoperative antibiotics of  Anti-infectives (From admission, onward)    Start     Dose/Rate Route Frequency Ordered Stop   05/15/21 0000  cefadroxil (DURICEF) 500 MG capsule        500 mg Oral 2 times daily 05/15/21 0741 05/22/21 2359   05/14/21 1500  ceFAZolin (ANCEF) IVPB 2g/100 mL premix        2 g 200 mL/hr over 30 Minutes Intravenous Every 6 hours 05/14/21 1225 05/15/21 2145   05/14/21 0630  ceFAZolin (ANCEF) IVPB 2g/100 mL premix        2 g 200 mL/hr over 30 Minutes Intravenous On call to O.R. 05/14/21 0626 05/14/21 0858      and started on DVT prophylaxis in the form of  Eliquis .   PT and OT were ordered for total joint protocol. Discharge planning consulted to help with postop disposition and equipment needs. Patient had a fair night on the evening of surgery. They started to get up OOB with therapy on POD #1 but was limited by pain.  Continued to work with therapy into POD #2. Pt was seen during rounds on day two and was ready to go home pending progress with therapy. Pt worked with therapy for two additional sessions and was meeting their goals. She was discharged to home later that day in stable condition.  Diet: Regular diet Activity: WBAT Follow-up: in 2 weeks Disposition: Home Discharged Condition: good   Discharge Instructions     Call MD / Call 911    Complete by: As directed    If you experience chest pain or shortness of breath, CALL 911 and be transported to the hospital emergency room.  If you develope a fever above 101 F, pus (white drainage) or increased drainage or redness at the wound, or calf pain, call your surgeon's office.   Change dressing   Complete by: As directed    Maintain surgical dressing until follow up in the clinic. If the edges start to pull up, may reinforce with tape. If the dressing is no longer working, may remove and cover with gauze and tape, but must keep the area dry and clean.  Call with any questions or concerns.   Constipation Prevention   Complete by: As directed    Drink plenty of fluids.  Prune juice may be helpful.  You may use a stool softener, such as Colace (over the counter) 100 mg twice a day.  Use MiraLax (over the counter) for constipation as needed.   Diet - low sodium heart healthy   Complete by: As directed    Increase activity slowly as tolerated   Complete by: As directed    Weight bearing as tolerated with assist device (walker, cane, etc) as directed, use it as long as suggested by your surgeon or therapist, typically at least 4-6 weeks.   Post-operative opioid taper instructions:   Complete by: As directed    POST-OPERATIVE OPIOID TAPER INSTRUCTIONS: It is important to wean off of your opioid medication as soon as possible. If you do not need pain medication after your surgery it is ok to stop day one. Opioids include: Codeine, Hydrocodone(Norco, Vicodin), Oxycodone(Percocet, oxycontin) and hydromorphone amongst others.  Long term and even short term use of opiods can cause: Increased pain response Dependence Constipation Depression Respiratory depression And more.  Withdrawal symptoms can include Flu like symptoms Nausea, vomiting And more Techniques to manage these symptoms Hydrate well  Eat regular healthy meals Stay active Use relaxation techniques(deep breathing,  meditating, yoga) Do Not substitute Alcohol to help with tapering If you have been on opioids for less than two weeks and do not have pain than it is ok to stop all together.  Plan to wean off of opioids This plan should start within one week post op of your joint replacement. Maintain the same interval or time between taking each dose and first decrease the dose.  Cut the total daily intake of opioids by one tablet each day Next start to increase the time between doses. The last dose that should be eliminated is the evening dose.      TED hose   Complete by: As directed    Use stockings (TED hose) for 2 weeks on both leg(s).  You may remove them at night for sleeping.      Allergies as of 05/16/2021       Reactions   Other Other (See Comments)   ekg pads burns skin needs hypoallergenic   Losartan Other (See Comments)   Chest pain.    Tramadol Other (See Comments)   GI upset, "I felt like I was in a fog"        Medication List     STOP taking these medications    metFORMIN 500 MG tablet Commonly known as: GLUCOPHAGE   traMADol 50 MG tablet Commonly known as: ULTRAM       TAKE these medications    acetaminophen 500 MG tablet Commonly known as: TYLENOL Take 2 tablets (1,000 mg total) by mouth every 6 (six) hours.   albuterol 108 (90 Base) MCG/ACT inhaler Commonly known as: VENTOLIN HFA Inhale 2 puffs into the lungs every 6 (six) hours as needed for wheezing or shortness of breath.   amLODipine 5 MG tablet Commonly known as: NORVASC Take 1 tablet (5 mg total) by mouth daily.   apixaban 5 MG Tabs tablet Commonly known as: ELIQUIS Take 1 tablet (5 mg total) by mouth 2 (two) times daily.   CALCIUM 600+D3 PO Take 1 tablet by mouth in the morning.   dicyclomine 20 MG tablet Commonly known as: BENTYL Take 1 tablet (20 mg total) by mouth 2 (two) times daily.   docusate sodium 100 MG capsule Commonly known as: COLACE Take 1 capsule (100 mg total) by mouth 2  (two) times daily.   ELDERBERRY PO Take 2 tablets by mouth in the morning.   HYDROmorphone 2 MG tablet Commonly known as: DILAUDID Take 1 tablet (2 mg total) by mouth every 4 (four) hours as needed for severe pain.   lidocaine 5 % Commonly known as: Lidoderm Place 1 patch onto the skin daily as needed. Remove & Discard patch within 12 hours or as directed by MD   methocarbamol 500 MG tablet Commonly known as: ROBAXIN Take 1 tablet (500 mg total) by mouth every 6 (six) hours as needed for muscle spasms. What changed:  when to take this additional instructions   metoprolol succinate 25 MG 24 hr tablet Commonly known as: Toprol XL Take 1 tablet (25 mg total) by mouth 2 (two) times daily.   multivitamin with minerals Tabs tablet Take 1 tablet by mouth in the morning.   ondansetron 4 MG tablet Commonly known as: ZOFRAN Take 1 tablet (4 mg total) by mouth every 6 (six) hours as needed for nausea.   polyethylene glycol 17 g packet Commonly known as: MIRALAX / GLYCOLAX Take 17 g by mouth daily as  needed for mild constipation.   propranolol 10 MG tablet Commonly known as: INDERAL Take 1 tablet (10 mg total) by mouth 4 (four) times daily as needed.   sucralfate 1 GM/10ML suspension Commonly known as: Carafate Take 10 mLs (1 g total) by mouth 4 (four) times daily -  with meals and at bedtime.   TURMERIC PO Take 2 capsules by mouth in the morning.   ULTRAFLORA IMMUNE HEALTH PO Take 1 tablet by mouth daily.       ASK your doctor about these medications    cefadroxil 500 MG capsule Commonly known as: DURICEF Take 1 capsule (500 mg total) by mouth 2 (two) times daily for 7 days. Ask about: Should I take this medication?               Discharge Care Instructions  (From admission, onward)           Start     Ordered   05/15/21 0000  Change dressing       Comments: Maintain surgical dressing until follow up in the clinic. If the edges start to pull up, may  reinforce with tape. If the dressing is no longer working, may remove and cover with gauze and tape, but must keep the area dry and clean.  Call with any questions or concerns.   05/15/21 0734            Follow-up Information     Paralee Cancel, MD. Schedule an appointment as soon as possible for a visit in 2 week(s).   Specialty: Orthopedic Surgery Contact information: [address] [place] [address] [phone]                 Signed: Griffith Citron, PA-C Orthopedic Surgery 05/23/2021, 7:11 AM

## 2021-08-14 ENCOUNTER — Encounter (INDEPENDENT_AMBULATORY_CARE_PROVIDER_SITE_OTHER): Payer: Self-pay

## 2021-08-20 ENCOUNTER — Other Ambulatory Visit (HOSPITAL_COMMUNITY): Payer: Self-pay | Admitting: Gastroenterology

## 2021-08-20 ENCOUNTER — Other Ambulatory Visit: Payer: Self-pay | Admitting: Gastroenterology

## 2021-08-20 DIAGNOSIS — R1013 Epigastric pain: Secondary | ICD-10-CM

## 2021-09-02 ENCOUNTER — Ambulatory Visit (HOSPITAL_COMMUNITY)

## 2021-09-02 ENCOUNTER — Encounter (HOSPITAL_COMMUNITY): Payer: Self-pay

## 2021-09-13 ENCOUNTER — Other Ambulatory Visit: Payer: Self-pay | Admitting: Gastroenterology

## 2021-09-16 ENCOUNTER — Telehealth: Payer: Self-pay | Admitting: *Deleted

## 2021-09-16 NOTE — Telephone Encounter (Signed)
   Pre-operative Risk Assessment    Patient Name: [name]  DOB: 1955-02-12 MRN: [id number]      Request for Surgical Clearance    Procedure:   EGD  Date of Surgery:  Clearance 12/06/21                                 Surgeon:  DR. HUNG Surgeon's Group or Practice Name:  [name] Phone number:  [phone] Fax number:  [phone]   Type of Clearance Requested:   - Medical  - Pharmacy:  Hold Apixaban (Eliquis)     Type of Anesthesia:   PROPOFOL   Additional requests/questions:    Jiles Prows   09/16/2021, 3:00 PM

## 2021-09-18 ENCOUNTER — Ambulatory Visit: Attending: Nurse Practitioner | Admitting: Nurse Practitioner

## 2021-09-18 ENCOUNTER — Telehealth: Payer: Self-pay | Admitting: *Deleted

## 2021-09-18 DIAGNOSIS — Z01810 Encounter for preprocedural cardiovascular examination: Secondary | ICD-10-CM

## 2021-09-18 NOTE — Telephone Encounter (Signed)
Patient with diagnosis of PAF on Eliquis for anticoagulation.    Procedure: EGD Date of procedure: 12/06/21   CHA2DS2-VASc Score = 3  This indicates a 3.2% annual risk of stroke. The patient's score is based upon: CHF History: 1 HTN History: 1 Diabetes History: 0 Stroke History: 0 Vascular Disease History: 0 Age Score: 0 Gender Score: 1   CrCl 99 mL/min Platelet count 354K  Per office protocol, patient can hold Eliquis for 2 days prior to procedure.  **This guidance is not considered finalized until pre-operative APP has relayed final recommendations.**

## 2021-09-18 NOTE — Telephone Encounter (Signed)
Pt agreeable to plan of care for urgent add on televist due to procedure date was moved up to Friday 09/20/21. Pt did take her Eliquis this morning at 10 am. I did instruct to not take any more Eliquis until after her procedure as she is needing to hold x 2 days prior. Med rec and consent are done. Pt is grateful for the help.    I did tell her that I will update the requesting office of URGENT ADD ON due to procedure date changed.

## 2021-09-18 NOTE — Progress Notes (Signed)
Virtual Visit via Telephone Note   Because of [name]'s co-morbid illnesses, she is at least at moderate risk for complications without adequate follow up.  This format is felt to be most appropriate for this patient at this time.  The patient did not have access to video technology/had technical difficulties with video requiring transitioning to audio format only (telephone).  All issues noted in this document were discussed and addressed.  No physical exam could be performed with this format.  Please refer to the patient's chart for her consent to telehealth for [facility].  Evaluation Performed:  Preoperative cardiovascular risk assessment _____________   Date:  09/18/2021   Patient ID:  [name], DOB 10/06/1955, MRN [id number] Patient Location:  Home Provider location:   Office  Primary Care Provider:  London Pepper, MD Primary Cardiologist:  Mertie Moores, MD  Chief Complaint / Patient Profile   66 y.o. y/o female with a h/o paroxysmal atrial fibrillation/flutter, chronic diastolic heart failure, hypertension, OSA, asthma, GERD, and obesity who is pending endoscopy on 09/20/2021 with Dr. Benson Norway of [facility] and presents today for telephonic preoperative cardiovascular risk assessment.  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 unabale to pass ETT with Sabra Heck 2; unable to visualize cord with MAC 3; intubating LMA used; uneventful intubation using Glidescope  2016, 2022   Diverticulosis, with history of diverticulitis of descending and sigmoid colon 03/03/2007   Dysrhythmia    atrial fib   Edema of both lower legs    Essential hypertension 06/25/2006   Qualifier: Diagnosis of  By: Tiney Rouge CMA, Ellison Hughs     Fatty liver    Gastritis    GERD 03/03/2007   Qualifier: Diagnosis of  By: Nils Pyle CMA (AAMA), Mearl Latin   Qualifier: Diagnosis of  By: Shane Crutch, [name]  pt denies at preop of 02/15/20    Hepatic steatosis, found on CT 03/03/2007       Hiatal hernia, small 03/28/2016   EGD by [place] 12/09/12.    Hip pain    History of colonic polyps 04/21/2008   Last Colonoscopy was 12/09/12, Dr. Fuller Plan. No polyp at that time. Repeat in 5 years.    Internal hemorrhoids 03/03/2007   Last Colonoscopy was 12/09/12, Dr. Fuller Plan. Repeat in 5 years.    Joint pain    Lactose intolerance    Left adrenal mass ([place]) 05/12/2013   Menopausal vasomotor syndrome 03/28/2016   Morbid obesity ([place]) (BMI 35 plus 2 comorbidities) 03/06/2016   Comorbid conditions include HTN, HLD, hepatic steatosis, OA, GERD, OSA.   OSA (obstructive sleep apnea) 03/03/2007   cpap    PAF (paroxysmal atrial fibrillation) ([place]) 04/24/2015   Palpitations    PONV (postoperative nausea and vomiting)    area on left forearm DO NOT PLACE IV OR STICK ? vascular"will bleed"   Umbilical hernia without obstruction or gangrene, fat containing 03/28/2016   Found on CT, 2017. No issues.   Past Surgical  History:  Procedure Laterality Date   ABDOMINAL HYSTERECTOMY     APPENDECTOMY     BREAST REDUCTION SURGERY     BUNIONECTOMY     bilateral   CERVICAL FUSION     CHOLECYSTECTOMY N/A 10/01/2016   Procedure: LAPAROSCOPIC CHOLECYSTECTOMY;  Surgeon: Coralie Keens, MD;  Location: [facility];  Service: General;  Laterality: N/A;   COLONOSCOPY WITH PROPOFOL N/A 05/12/2019   Procedure: COLONOSCOPY WITH PROPOFOL;  Surgeon: Juanita Craver, MD;  Location: [facility];  Service: Endoscopy;  Laterality: N/A;   MAXIMUM  ACCESS (MAS)POSTERIOR LUMBAR INTERBODY FUSION (PLIF) 2 LEVEL N/A 09/27/2014   Procedure: Maximum Access Surgery Posterior Lumbar Interbody Fusion Lumbar three-four, Lumbar four-five;  Surgeon: Eustace Moore, MD;  Location: [facility];  Service: Neurosurgery;  Laterality: N/A;   POLYPECTOMY  05/12/2019   Procedure: POLYPECTOMY;  Surgeon: Juanita Craver, MD;  Location: [facility];  Service: Endoscopy;;   SPINE SURGERY     TOTAL ABDOMINAL HYSTERECTOMY W/ BILATERAL SALPINGOOPHORECTOMY     TOTAL HIP ARTHROPLASTY Left 02/21/2020   Procedure: TOTAL HIP ARTHROPLASTY ANTERIOR APPROACH;  Surgeon: Paralee Cancel, MD;  Location: [facility];  Service: Orthopedics;  Laterality: Left;  70 mins   TOTAL HIP ARTHROPLASTY Right 05/14/2021   Procedure: TOTAL HIP ARTHROPLASTY ANTERIOR APPROACH;  Surgeon: Paralee Cancel, MD;  Location: [facility];  Service: Orthopedics;  Laterality: Right;    Allergies  Allergies  Allergen Reactions   Other Other (See Comments)    ekg pads burns skin needs hypoallergenic   Losartan Other (See Comments)    Chest pain.    Tramadol Other (See Comments)    GI upset, "I felt like I was in a fog"    History of Present Illness    [name] is a 66 y.o. female who presents via audio/video conferencing for a telehealth visit today.  Pt was last seen in cardiology clinic on 04/10/2021 by Richardson Dopp, PA.  At that time [name] was doing well. The patient is now pending procedure as outlined above. Since her last visit, she has done well from a cardiac standpoint. She denies chest pain, palpitations, dyspnea, pnd, orthopnea, n, v, dizziness, syncope, edema, weight gain, or early satiety. All other systems reviewed and are otherwise negative except as noted above.   Home Medications    Prior to Admission medications   Medication Sig Start Date End Date Taking? Authorizing Provider  amLODipine (NORVASC) 5 MG tablet Take 1 tablet (5 mg total) by mouth daily. 02/22/21   Nahser, Wonda Cheng,  MD  apixaban (ELIQUIS) 5 MG TABS tablet Take 1 tablet (5 mg total) by mouth 2 (two) times daily. 02/18/21   Nahser, Wonda Cheng, MD  Calcium Carb-Cholecalciferol (CALCIUM 600+D3 PO) Take 1 tablet by mouth in the morning.    [provider]  metoprolol succinate (TOPROL XL) 25 MG 24 hr tablet Take 1 tablet (25 mg total) by mouth 2 (two) times daily. 02/22/21   Nahser, Wonda Cheng, MD  milk thistle 175 MG tablet Take 175 mg by mouth daily.    [provider]  Multiple Vitamin (MULTIVITAMIN WITH MINERALS) TABS tablet Take 1 tablet by mouth in the morning.    [provider]  pantoprazole (PROTONIX) 40 MG tablet Take 40 mg by mouth daily.    [provider]  Probiotic Product ([place]) Take 1 tablet by mouth daily.    [provider]  propranolol (INDERAL) 10 MG tablet Take 1 tablet (10  mg total) by mouth 4 (four) times daily as needed. 11/10/18   Nahser, Wonda Cheng, MD  sucralfate (CARAFATE) 1 g tablet Take 1 g by mouth in the morning, at noon, and at bedtime.    [provider]  TURMERIC PO Take 1 capsule by mouth in the morning.    [provider]    Physical Exam    Vital Signs:  [name] does not have vital signs available for review today.  Given telephonic nature of communication, physical exam is limited. AAOx3. NAD. Normal affect.  Speech and respirations are unlabored.  Accessory Clinical Findings    None  Assessment & Plan    1.  Preoperative Cardiovascular Risk Assessment:  According to the Revised Cardiac Risk Index (RCRI), her Perioperative Risk of Major Cardiac Event is (%): 0.9  Her Functional Capacity in METs is: 5.78 according to the Duke Activity Status Index (DASI).Therefore, based on ACC/AHA guidelines, patient would be at acceptable risk for the planned procedure without further cardiovascular testing.   The patient was advised that if she develops new symptoms prior to surgery to contact  our office to arrange for a follow-up visit, and she verbalized understanding.  Patient with diagnosis of PAF on Eliquis for anticoagulation.     Procedure: EGD Date of procedure: 09/20/21     CHA2DS2-VASc Score = 3  This indicates a 3.2% annual risk of stroke. The patient's score is based upon: CHF History: 1 HTN History: 1 Diabetes History: 0 Stroke History: 0 Vascular Disease History: 0 Age Score: 0 Gender Score: 1     CrCl 99 mL/min Platelet count 354K   Per office protocol, patient can hold Eliquis for 2 days prior to procedure. Please resume Eliquis as soon as possible postprocedure, at the discretion of the surgeon.     A copy of this note will be routed to requesting surgeon.  Time:   Today, I have spent 5 minutes with the patient with telehealth technology discussing medical history, symptoms, and management plan.     Lenna Sciara, NP  09/18/2021, 4:03 PM

## 2021-09-18 NOTE — Telephone Encounter (Signed)
Pt agreeable to plan of care for urgent add on televist due to procedure date was moved up to Friday 09/20/21. Pt did take her Eliquis this morning at 10 am. I did instruct to not take any more Eliquis until after her procedure as she is needing to hold x 2 days prior. Med rec and consent are done. Pt is grateful for the help.      Patient Consent for Virtual Visit        [name] has provided verbal consent on 09/18/2021 for a virtual visit (video or telephone).   CONSENT FOR VIRTUAL VISIT FOR:  [name] Hartshorn  By participating in this virtual visit I agree to the following:  I hereby voluntarily request, consent and authorize [facility] and its employed or contracted physicians, physician assistants, nurse practitioners or other licensed health care professionals (the Practitioner), to provide me with telemedicine health care services (the "Services") as deemed necessary by the treating Practitioner. I acknowledge and consent to receive the Services by the Practitioner via telemedicine. I understand that the telemedicine visit will involve communicating with the Practitioner through live audiovisual communication technology and the disclosure of certain medical information by electronic transmission. I acknowledge that I have been given the opportunity to request an in-person assessment or other available alternative prior to the telemedicine visit and am voluntarily participating in the telemedicine visit.  I understand that I have the right to withhold or withdraw my consent to the use of telemedicine in the course of my care at any time, without affecting my right to future care or treatment, and that the Practitioner or I may terminate the telemedicine visit at any time. I understand that I have the right to inspect all information obtained and/or recorded in the course of the telemedicine visit and may receive copies of available information for a reasonable fee.  I  understand that some of the potential risks of receiving the Services via telemedicine include:  Delay or interruption in medical evaluation due to technological equipment failure or disruption; Information transmitted may not be sufficient (e.g. poor resolution of images) to allow for appropriate medical decision making by the Practitioner; and/or  In rare instances, security protocols could fail, causing a breach of personal health information.  Furthermore, I acknowledge that it is my responsibility to provide information about my medical history, conditions and care that is complete and accurate to the best of my ability. I acknowledge that Practitioner's advice, recommendations, and/or decision may be based on factors not within their control, such as incomplete or inaccurate data provided by me or distortions of diagnostic images or specimens that may result from electronic transmissions. I understand that the practice of medicine is not an exact science and that Practitioner makes no warranties or guarantees regarding treatment outcomes. I acknowledge that a copy of this consent can be made available to me via my patient portal ([facility]), or I can request a printed copy by calling the office of [facility].    I understand that my insurance will be billed for this visit.   I have read or had this consent read to me. I understand the contents of this consent, which adequately explains the benefits and risks of the Services being provided via telemedicine.  I have been provided ample opportunity to ask questions regarding this consent and the Services and have had my questions answered to my satisfaction. I give my informed consent for the services to be  provided through the use of telemedicine in my medical care

## 2021-09-18 NOTE — Telephone Encounter (Signed)
/    Name: [name]  DOB: 1955/09/30  MRN: [id number]  Primary Cardiologist: Mertie Moores, MD   Preoperative team, please contact this patient and set up a phone call appointment for further preoperative risk assessment. Please obtain consent and complete medication review. Thank you for your help.  I confirm that guidance regarding antiplatelet and oral anticoagulation therapy has been completed and, if necessary, noted below.  Patient with diagnosis of PAF on Eliquis for anticoagulation.     Procedure: EGD Date of procedure: 09/20/21     CHA2DS2-VASc Score = 3  This indicates a 3.2% annual risk of stroke. The patient's score is based upon: CHF History: 1 HTN History: 1 Diabetes History: 0 Stroke History: 0 Vascular Disease History: 0 Age Score: 0 Gender Score: 1     CrCl 99 mL/min Platelet count 354K   Per office protocol, patient can hold Eliquis for 2 days prior to procedure.   Lenna Sciara, NP 09/18/2021, 11:11 AM [place]

## 2021-09-19 NOTE — Anesthesia Preprocedure Evaluation (Addendum)
Anesthesia Evaluation  Patient identified by MRN, date of birth, ID band Patient awake    Reviewed: Allergy & Precautions, NPO status , Patient's Chart, lab work & pertinent test results  History of Anesthesia Complications (+) PONV, DIFFICULT AIRWAY and history of anesthetic complications  Airway Mallampati: III  TM Distance: <3 FB   Mouth opening: Limited Mouth Opening Comment: 04/09/04 with difficult intubation. unabale to pass ETT with Sabra Heck 2; unable to visualize cord with MAC 3; intubating LMA used; uneventful intubation using Glidescope 2016, 2022   Dental  (+) Partial Upper, Dental Advisory Given, Teeth Intact   Pulmonary neg pulmonary ROS, asthma , sleep apnea ,    breath sounds clear to auscultation       Cardiovascular hypertension, Pt. on medications and Pt. on home beta blockers +CHF  negative cardio ROS  + dysrhythmias Atrial Fibrillation  Rhythm:Regular Rate:Normal     Neuro/Psych PSYCHIATRIC DISORDERS Depression  Neuromuscular disease negative neurological ROS  negative psych ROS   GI/Hepatic negative GI ROS, Neg liver ROS, hiatal hernia, GERD  Medicated,  Endo/Other  negative endocrine ROSdiabetes, Type 2, Oral Hypoglycemic Agents  Renal/GU negative Renal ROS  negative genitourinary   Musculoskeletal negative musculoskeletal ROS (+) Arthritis ,   Abdominal (+) + obese,   Peds negative pediatric ROS (+)  Hematology negative hematology ROS (+) Blood dyscrasia, anemia ,   Anesthesia Other Findings   Reproductive/Obstetrics negative OB ROS                            Anesthesia Physical  Anesthesia Plan  ASA: 3  Anesthesia Plan: MAC   Post-op Pain Management: Minimal or no pain anticipated   Induction: Intravenous  PONV Risk Score and Plan: 4 or greater and Propofol infusion  Airway Management Planned: Natural Airway and Simple Face Mask  Additional Equipment:  None  Intra-op Plan:   Post-operative Plan: Extubation in OR  Informed Consent: I have reviewed the patients History and Physical, chart, labs and discussed the procedure including the risks, benefits and alternatives for the proposed anesthesia with the patient or authorized representative who has indicated his/her understanding and acceptance.     Dental advisory given  Plan Discussed with: CRNA and Anesthesiologist  Anesthesia Plan Comments: ( )       Anesthesia Quick Evaluation

## 2021-09-20 ENCOUNTER — Other Ambulatory Visit: Payer: Self-pay

## 2021-09-20 ENCOUNTER — Ambulatory Visit (HOSPITAL_BASED_OUTPATIENT_CLINIC_OR_DEPARTMENT_OTHER): Admitting: Anesthesiology

## 2021-09-20 ENCOUNTER — Encounter (HOSPITAL_COMMUNITY): Payer: Self-pay | Admitting: Gastroenterology

## 2021-09-20 ENCOUNTER — Encounter (HOSPITAL_COMMUNITY)
Admission: RE | Disposition: A | Discharge status: Home / Self Care | Payer: Self-pay | Source: Home / Self Care | Attending: Gastroenterology

## 2021-09-20 ENCOUNTER — Ambulatory Visit (HOSPITAL_COMMUNITY)
Admission: RE | Admit: 2021-09-20 | Discharge: 2021-09-20 | Disposition: A | Discharge status: Home / Self Care | Attending: Gastroenterology | Admitting: Gastroenterology

## 2021-09-20 ENCOUNTER — Ambulatory Visit (HOSPITAL_COMMUNITY): Admitting: Anesthesiology

## 2021-09-20 DIAGNOSIS — E119 Type 2 diabetes mellitus without complications: Secondary | ICD-10-CM | POA: Diagnosis not present

## 2021-09-20 DIAGNOSIS — I4891 Unspecified atrial fibrillation: Secondary | ICD-10-CM | POA: Diagnosis not present

## 2021-09-20 DIAGNOSIS — K219 Gastro-esophageal reflux disease without esophagitis: Secondary | ICD-10-CM | POA: Diagnosis not present

## 2021-09-20 DIAGNOSIS — G4733 Obstructive sleep apnea (adult) (pediatric): Secondary | ICD-10-CM | POA: Diagnosis not present

## 2021-09-20 DIAGNOSIS — R1013 Epigastric pain: Secondary | ICD-10-CM | POA: Insufficient documentation

## 2021-09-20 DIAGNOSIS — I110 Hypertensive heart disease with heart failure: Secondary | ICD-10-CM | POA: Diagnosis not present

## 2021-09-20 DIAGNOSIS — K760 Fatty (change of) liver, not elsewhere classified: Secondary | ICD-10-CM | POA: Insufficient documentation

## 2021-09-20 DIAGNOSIS — D649 Anemia, unspecified: Secondary | ICD-10-CM | POA: Insufficient documentation

## 2021-09-20 DIAGNOSIS — I480 Paroxysmal atrial fibrillation: Secondary | ICD-10-CM | POA: Diagnosis not present

## 2021-09-20 DIAGNOSIS — Z7984 Long term (current) use of oral hypoglycemic drugs: Secondary | ICD-10-CM | POA: Diagnosis not present

## 2021-09-20 DIAGNOSIS — I509 Heart failure, unspecified: Secondary | ICD-10-CM | POA: Diagnosis not present

## 2021-09-20 DIAGNOSIS — D759 Disease of blood and blood-forming organs, unspecified: Secondary | ICD-10-CM | POA: Diagnosis not present

## 2021-09-20 DIAGNOSIS — K449 Diaphragmatic hernia without obstruction or gangrene: Secondary | ICD-10-CM | POA: Insufficient documentation

## 2021-09-20 DIAGNOSIS — I5032 Chronic diastolic (congestive) heart failure: Secondary | ICD-10-CM | POA: Diagnosis not present

## 2021-09-20 HISTORY — PX: ESOPHAGOGASTRODUODENOSCOPY (EGD) WITH PROPOFOL: SHX5813

## 2021-09-20 SURGERY — ESOPHAGOGASTRODUODENOSCOPY (EGD) WITH PROPOFOL
Anesthesia: Monitor Anesthesia Care

## 2021-09-20 MED ORDER — SODIUM CHLORIDE 0.9 % IV SOLN: INTRAVENOUS | Status: DC

## 2021-09-20 MED ORDER — LIDOCAINE HCL (PF) 2 % IJ SOLN
INTRAMUSCULAR | Status: DC | PRN
  Administered 2021-09-20 (×2): 100 mg via INTRADERMAL

## 2021-09-20 MED ORDER — PROPOFOL 1000 MG/100ML IV EMUL
INTRAVENOUS | Status: AC
  Filled 2021-09-20: qty 100

## 2021-09-20 MED ORDER — GLYCOPYRROLATE 0.2 MG/ML IJ SOLN
INTRAMUSCULAR | Status: DC | PRN
  Administered 2021-09-20: .1 mg via INTRAVENOUS

## 2021-09-20 MED ORDER — PROPOFOL 500 MG/50ML IV EMUL
INTRAVENOUS | Status: DC | PRN
  Administered 2021-09-20: 100 mg via INTRAVENOUS
  Administered 2021-09-20: 60 ug/kg/min via INTRAVENOUS

## 2021-09-20 MED ORDER — LACTATED RINGERS IV SOLN
INTRAVENOUS | Status: DC
  Administered 2021-09-20: 1000 mL via INTRAVENOUS

## 2021-09-20 SURGICAL SUPPLY — 15 items

## 2021-09-20 NOTE — Op Note (Signed)
[facility] Patient Name: [name] Procedure Date: 09/20/2021 MRN: [id number] Attending MD: Carol Ada , MD Date of Birth: 1955/07/29 CSN: [id number] Age: 66 Admit Type: Outpatient Procedure:                Upper GI endoscopy Indications:              Epigastric abdominal pain, Periumbilical abdominal                            pain Providers:                Carol Ada, MD, Elmer Ramp. Tilden Dome, RN, [employer], Technician, Brien Mates, RNFA Referring MD:             Carol Ada, MD Medicines:                Propofol per Anesthesia Complications:            No immediate complications. Estimated Blood Loss:     Estimated blood loss: none. Procedure:                Pre-Anesthesia Assessment:                           - Prior to the procedure, a History and Physical                            was performed, and patient medications and                            allergies were reviewed. The patient's tolerance of                            previous anesthesia was also reviewed. The risks                            and benefits of the procedure and the sedation                            options and risks were discussed with the patient.                            All questions were answered, and informed consent                            was obtained. Prior Anticoagulants: The patient has                            taken no previous anticoagulant or antiplatelet                            agents. ASA Grade Assessment: III - A patient with  severe systemic disease. After reviewing the risks                            and benefits, the patient was deemed in                            satisfactory condition to undergo the procedure.                           - Sedation was administered by an anesthesia                            professional. Deep sedation was attained.                           After  obtaining informed consent, the endoscope was                            passed under direct vision. Throughout the                            procedure, the patient's blood pressure, pulse, and                            oxygen saturations were monitored continuously. The                            [id number]-H190 ([id number]) Olympus endoscope was introduced                            through the mouth, and advanced to the second part                            of duodenum. The upper GI endoscopy was                            accomplished without difficulty. The patient                            tolerated the procedure well. Scope In: Scope Out: Findings:      The esophagus was normal.      The stomach was normal.      The examined duodenum was normal. Impression:               - Normal esophagus.                           - Normal stomach.                           - Normal examined duodenum.                           - No specimens collected. Moderate Sedation:      Not Applicable - Patient had care per Anesthesia. Recommendation:           -  Patient has a contact number available for                            emergencies. The signs and symptoms of potential                            delayed complications were discussed with the                            patient. Return to normal activities tomorrow.                            Written discharge instructions were provided to the                            patient.                           - Resume previous diet.                           - Continue present medications.                           - Follow up with Dr. Collene Mares in one month. Procedure Code(s):        --- Professional ---                           [phone], Esophagogastroduodenoscopy, flexible,                            transoral; diagnostic, including collection of                            specimen(s) by brushing or washing, when performed                             (separate procedure) Diagnosis Code(s):        --- Professional ---                           R10.13, Epigastric pain                           I68.03, Periumbilical pain CPT copyright 2019 American Medical Association. All rights reserved. The codes documented in this report are preliminary and upon coder review may  be revised to meet current compliance requirements. Carol Ada, MD Carol Ada, MD 09/20/2021 7:43:10 AM This report has been signed electronically. Number of Addenda: 0

## 2021-09-20 NOTE — Anesthesia Postprocedure Evaluation (Signed)
Anesthesia Post Note  Patient: [name]  Procedure(s) Performed: ESOPHAGOGASTRODUODENOSCOPY (EGD) WITH PROPOFOL     Patient location during evaluation: PACU Anesthesia Type: MAC Level of consciousness: awake and alert Pain management: pain level controlled Vital Signs Assessment: post-procedure vital signs reviewed and stable Respiratory status: spontaneous breathing, nonlabored ventilation, respiratory function stable and patient connected to nasal cannula oxygen Cardiovascular status: stable and blood pressure returned to baseline Postop Assessment: no apparent nausea or vomiting Anesthetic complications: no   No notable events documented.  Last Vitals:  Vitals:   09/20/21 0805 09/20/21 0810  BP:  (!) 171/94  Pulse: 72 66  Resp: 18 18  Temp:    SpO2: 97% 97%    Last Pain:  Vitals:   09/20/21 0810  TempSrc:   PainSc: 0-No pain                 Druscilla Petsch

## 2021-09-20 NOTE — H&P (Signed)
[name] HPI: This 66 year old black female presents to the office for a 4 week follow up. She was started on Sucralfate 1 gm, three times a day. She also takes Pantoprazole 40 mg in the mornings. The epigastric pain he was having is improved a little. She stopped the sucralfate for one day and the epigastric pain recurred. She has 2 BM's per day with no obvious blood or mucus in the stool. She has good appetite and her weight has been stable. She denies having any complaints of nausea, vomiting, dysphagia or odynophagia. She denies having a family history of colon cancer, celiac sprue or IBD. She has a history of fatty liver.  Her last colonoscopy was done on 05/12/2019, when she was noted to have scattered diverticulosis, a lipoma was noted in the hepatic flexure and a tubular adenoma was removed.  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 unabale to pass ETT with Sabra Heck 2; unable to visualize cord with MAC 3; intubating LMA used; uneventful intubation using Glidescope 2016, 2022   Diverticulosis, with history of diverticulitis of descending and sigmoid colon 03/03/2007   Dysrhythmia    atrial fib   Edema of both lower legs    Essential hypertension 06/25/2006   Qualifier: Diagnosis of  By: Tiney Rouge CMA, Ellison Hughs     Fatty liver    Gastritis    GERD 03/03/2007   Qualifier: Diagnosis of  By: Nils Pyle CMA (AAMA), Mearl Latin   Qualifier: Diagnosis of  By: Shane Crutch, [name]  pt denies at preop of 02/15/20    Hepatic steatosis, found on CT  03/03/2007       Hiatal hernia, small 03/28/2016   EGD by [facility] 12/09/12.    Hip pain    History of colonic polyps 04/21/2008   Last Colonoscopy was 12/09/12, Dr. Fuller Plan. No polyp at that time. Repeat in 5 years.    Internal hemorrhoids 03/03/2007   Last Colonoscopy was 12/09/12, Dr. Fuller Plan. Repeat in 5 years.    Joint pain    Lactose intolerance    Left adrenal mass ([place]) 05/12/2013   Menopausal vasomotor syndrome 03/28/2016   Morbid obesity ([place]) (BMI 35 plus 2 comorbidities) 03/06/2016   Comorbid conditions include HTN, HLD, hepatic steatosis, OA, GERD, OSA.   OSA (obstructive sleep apnea) 03/03/2007   cpap    PAF (paroxysmal atrial fibrillation) ([place]) 04/24/2015   Palpitations    PONV (postoperative nausea and vomiting)    area on left forearm DO NOT PLACE IV OR STICK ? vascular"will bleed"   Umbilical hernia without obstruction or gangrene, fat containing 03/28/2016   Found on CT, 2017. No issues.    Past Surgical History:  Procedure Laterality Date   ABDOMINAL HYSTERECTOMY     APPENDECTOMY     BREAST REDUCTION SURGERY     BUNIONECTOMY     bilateral   CERVICAL FUSION     CHOLECYSTECTOMY N/A 10/01/2016   Procedure: LAPAROSCOPIC CHOLECYSTECTOMY;  Surgeon: Coralie Keens, MD;  Location: [facility];  Service: General;  Laterality: N/A;   COLONOSCOPY WITH PROPOFOL N/A 05/12/2019   Procedure: COLONOSCOPY WITH PROPOFOL;  Surgeon: Juanita Craver, MD;  [facility];  Service: Endoscopy;  Laterality: N/A;   MAXIMUM ACCESS (MAS)POSTERIOR LUMBAR INTERBODY FUSION (PLIF) 2 LEVEL N/A 09/27/2014   Procedure: Maximum Access Surgery Posterior Lumbar Interbody Fusion Lumbar three-four, Lumbar four-five;  Surgeon: Eustace Moore, MD;  Location: [facility];  Service: Neurosurgery;  Laterality: N/A;   POLYPECTOMY  05/12/2019   Procedure: POLYPECTOMY;  Surgeon: Juanita Craver, MD;  [facility];  Service: Endoscopy;;   SPINE SURGERY     TOTAL ABDOMINAL HYSTERECTOMY W/ BILATERAL  SALPINGOOPHORECTOMY     TOTAL HIP ARTHROPLASTY Left 02/21/2020   Procedure: TOTAL HIP ARTHROPLASTY ANTERIOR APPROACH;  Surgeon: Paralee Cancel, MD;  [facility];  Service: Orthopedics;  Laterality: Left;  70 mins   TOTAL HIP ARTHROPLASTY Right 05/14/2021   Procedure: TOTAL HIP ARTHROPLASTY ANTERIOR APPROACH;  Surgeon: Paralee Cancel, MD;  [facility];  Service: Orthopedics;  Laterality: Right;    Family History  Problem Relation Age of Onset   Diabetes Mother    Hypertension Mother    Heart disease Mother    Sleep apnea Mother    Coronary artery disease Sister    Hypertension Sister    Colon cancer Neg Hx     Social History:  reports that she has never smoked. She has never used smokeless tobacco. She reports that she does not drink alcohol and does not use drugs.  Allergies:  Allergies  Allergen Reactions   Other Other (See Comments)    ekg pads burns skin needs hypoallergenic   Losartan Other (See Comments)    Chest pain.    Tramadol Other (See Comments)    GI upset, "I felt like I was in a fog"    Medications: Scheduled: Continuous:  sodium chloride     lactated ringers 1,000 mL (09/20/21 0705)    No results found for this or any previous visit (from the past 24 hour(s)).   No results found.  ROS:  As stated above in the HPI otherwise negative.  Blood pressure (!) 166/75, pulse 63, temperature 97.6 F (36.4 C), temperature source Tympanic, resp. rate 18, height _0  (1.651 m), weight 99.8 kg, SpO2 98 %.    PE: Gen: NAD, Alert and Oriented HEENT:  [place]/AT, EOMI Neck: Supple, no LAD Lungs: CTA Bilaterally CV: RRR without M/G/R ABD: Soft, NTND, +BS Ext: No C/C/E  Assessment/Plan: 1) Epigastric pain - EGD.  Sonita Michiels D 09/20/2021, 7:17 AM

## 2021-09-20 NOTE — Transfer of Care (Signed)
Immediate Anesthesia Transfer of Care Note  Patient: [name]  Procedure(s) Performed: ESOPHAGOGASTRODUODENOSCOPY (EGD) WITH PROPOFOL  Patient Location: PACU  Anesthesia Type:MAC  Level of Consciousness: awake  Airway & Oxygen Therapy: Patient Spontanous Breathing  Post-op Assessment: Report given to RN  Post vital signs: stable  Last Vitals:  Vitals Value Taken Time  BP 149/84 09/20/21 0752  Temp 36.7 C 09/20/21 0746  Pulse 79 09/20/21 0752  Resp 20 09/20/21 0746  SpO2 95 % 09/20/21 0752    Last Pain:  Vitals:   09/20/21 0752  TempSrc:   PainSc: 0-No pain         Complications: No notable events documented.

## 2021-09-20 NOTE — Discharge Instructions (Signed)

## 2021-09-22 ENCOUNTER — Encounter (HOSPITAL_COMMUNITY): Payer: Self-pay | Admitting: Gastroenterology

## 2021-10-02 ENCOUNTER — Encounter: Payer: Self-pay | Admitting: Cardiovascular Disease

## 2021-10-02 MED ORDER — APIXABAN 5 MG PO TABS: 5.0000 mg | ORAL_TABLET | Freq: Two times a day (BID) | ORAL | 0 refills | Status: DC

## 2021-10-29 ENCOUNTER — Ambulatory Visit
Admission: EM | Admit: 2021-10-29 | Discharge: 2021-10-29 | Disposition: A | Discharge status: Home / Self Care | Attending: Family Medicine | Admitting: Family Medicine

## 2021-10-29 DIAGNOSIS — Z1152 Encounter for screening for COVID-19: Secondary | ICD-10-CM | POA: Diagnosis not present

## 2021-10-29 DIAGNOSIS — J029 Acute pharyngitis, unspecified: Secondary | ICD-10-CM | POA: Insufficient documentation

## 2021-10-29 DIAGNOSIS — R0981 Nasal congestion: Secondary | ICD-10-CM | POA: Insufficient documentation

## 2021-10-29 DIAGNOSIS — J4521 Mild intermittent asthma with (acute) exacerbation: Secondary | ICD-10-CM | POA: Insufficient documentation

## 2021-10-29 DIAGNOSIS — R051 Acute cough: Secondary | ICD-10-CM | POA: Diagnosis not present

## 2021-10-29 DIAGNOSIS — J329 Chronic sinusitis, unspecified: Secondary | ICD-10-CM

## 2021-10-29 LAB — RESP PANEL BY RT-PCR (FLU A&B, COVID) ARPGX2
Influenza A by PCR: NEGATIVE
Influenza B by PCR: NEGATIVE
SARS Coronavirus 2 by RT PCR: NEGATIVE

## 2021-10-29 MED ORDER — ALBUTEROL SULFATE HFA 108 (90 BASE) MCG/ACT IN AERS: 1.0000 | INHALATION_SPRAY | Freq: Four times a day (QID) | RESPIRATORY_TRACT | 0 refills | Status: DC | PRN

## 2021-10-29 MED ORDER — AZITHROMYCIN 250 MG PO TABS: ORAL_TABLET | ORAL | 0 refills | Status: DC

## 2021-10-29 MED ORDER — BENZONATATE 200 MG PO CAPS: 200.0000 mg | ORAL_CAPSULE | Freq: Three times a day (TID) | ORAL | 0 refills | Status: DC | PRN

## 2021-10-29 MED ORDER — PREDNISONE 20 MG PO TABS: 40.0000 mg | ORAL_TABLET | Freq: Every day | ORAL | 0 refills | Status: DC

## 2021-10-29 NOTE — ED Provider Notes (Signed)
[name] CARE    CSN: [id number] Arrival date & time: 10/29/21  2025      History   Chief Complaint Chief Complaint  Patient presents with   Cough   Nasal Congestion    HPI [name] is a 66 y.o. female.   HPI  Patient states she has underlying asthma.  States she is "prone to bronchitis".  Has a chest infection since last week.  She has coughing, nasal congestion, sore throat.  No fever or chills, no headache or body aches.  She states her cough is harsh.  She states she feels a "rattle" in her chest.  She states it hurts with deep cough.  She is feeling tired.  She is not sleeping well.  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 unabale to pass ETT with Sabra Heck 2; unable to visualize cord with MAC 3; intubating LMA used; uneventful intubation using Glidescope 2016, 2022   Diverticulosis, with history of diverticulitis of descending and sigmoid colon 03/03/2007   Dysrhythmia    atrial fib   Edema of both lower legs    Essential hypertension 06/25/2006   Qualifier: Diagnosis of  By: Tiney Rouge CMA, Ellison Hughs     Fatty liver    Gastritis    GERD 03/03/2007   Qualifier: Diagnosis of  By: Nils Pyle CMA (AAMA), Mearl Latin   Qualifier: Diagnosis of  By: Shane Crutch, [name]  pt denies at preop of 02/15/20    Hepatic steatosis, found on CT 03/03/2007       Hiatal hernia, small 03/28/2016   EGD by [facility] 12/09/12.    Hip pain    History of colonic polyps 04/21/2008    Last Colonoscopy was 12/09/12, Dr. Fuller Plan. No polyp at that time. Repeat in 5 years.    Internal hemorrhoids 03/03/2007   Last Colonoscopy was 12/09/12, Dr. Fuller Plan. Repeat in 5 years.    Joint pain    Lactose intolerance    Left adrenal mass ([place]) 05/12/2013   Menopausal vasomotor syndrome 03/28/2016   Morbid obesity ([place]) (BMI 35 plus 2 comorbidities) 03/06/2016   Comorbid conditions include HTN, HLD, hepatic steatosis, OA, GERD, OSA.   OSA (obstructive sleep apnea) 03/03/2007   cpap    PAF (paroxysmal atrial fibrillation) ([place]) 04/24/2015   Palpitations    PONV (postoperative nausea and vomiting)    area on left forearm DO NOT PLACE IV OR STICK ? vascular"will bleed"   Umbilical hernia without obstruction or gangrene, fat containing 03/28/2016   Found on CT, 2017. No issues.    Patient Active Problem List   Diagnosis Date Noted   S/P total right hip arthroplasty 05/14/2021   Preoperative cardiovascular examination 04/10/2021   Class 2 obesity due to excess calories with body mass index (BMI) of 37.0 to 37.9 in adult 10/24/2020   Prediabetes 10/23/2020   NAFLD (nonalcoholic fatty liver disease) 10/23/2020   Osteoarthritis of left hip 02/21/2020   Status post  left hip replacement 02/21/2020   Biliary dyskinesia 10/01/2016   History of hysterectomy 03/28/2016   History of appendectomy 62/22/9798   Umbilical hernia without obstruction or gangrene, fat containing 03/28/2016   History of fusion of cervical spine 03/28/2016   Hiatal hernia, small 03/28/2016   Forearm mass, left 03/28/2016   Morbid obesity ([place]) (BMI 35 plus 2 comorbidities) 03/06/2016   Chronic heart failure with preserved ejection fraction (HCC) 07/20/2015   PAF (paroxysmal atrial fibrillation) ([place]) 04/24/2015   History of lumbar spinal fusion 09/27/2014   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   Left adrenal mass ([place]) 05/12/2013   Allergic rhinitis 04/03/2009   Asthma, mild, intermittent 01/23/2009    History of colonic polyps 04/21/2008   Depression, recurrent ([place]) 03/03/2007   Internal hemorrhoids 03/03/2007   GERD 03/03/2007   Diverticulosis, with history of diverticulitis of descending and sigmoid colon 03/03/2007   Hepatic steatosis, found on CT 03/03/2007   OSA (obstructive sleep apnea) 03/03/2007   Essential hypertension 06/25/2006    Past Surgical History:  Procedure Laterality Date   ABDOMINAL HYSTERECTOMY     APPENDECTOMY     BREAST REDUCTION SURGERY     BUNIONECTOMY     bilateral   CERVICAL FUSION     CHOLECYSTECTOMY N/A 10/01/2016   Procedure: LAPAROSCOPIC CHOLECYSTECTOMY;  Surgeon: Coralie Keens, MD;  Location: [facility];  Service: General;  Laterality: N/A;   COLONOSCOPY WITH PROPOFOL N/A 05/12/2019   Procedure: COLONOSCOPY WITH PROPOFOL;  Surgeon: Juanita Craver, MD;  [facility];  Service: Endoscopy;  Laterality: N/A;   ESOPHAGOGASTRODUODENOSCOPY (EGD) WITH PROPOFOL N/A 09/20/2021   Procedure: ESOPHAGOGASTRODUODENOSCOPY (EGD) WITH PROPOFOL;  Surgeon: Carol Ada, MD;  [facility];  Service: Gastroenterology;  Laterality: N/A;   MAXIMUM ACCESS (MAS)POSTERIOR LUMBAR INTERBODY FUSION (PLIF) 2 LEVEL N/A 09/27/2014   Procedure: Maximum Access Surgery Posterior Lumbar Interbody Fusion Lumbar three-four, Lumbar four-five;  Surgeon: Eustace Moore, MD;  Location: [facility];  Service: Neurosurgery;  Laterality: N/A;   POLYPECTOMY  05/12/2019   Procedure: POLYPECTOMY;  Surgeon: Juanita Craver, MD;  [facility];  Service: Endoscopy;;   SPINE SURGERY     TOTAL ABDOMINAL HYSTERECTOMY W/ BILATERAL SALPINGOOPHORECTOMY     TOTAL HIP ARTHROPLASTY Left 02/21/2020   Procedure: TOTAL HIP ARTHROPLASTY ANTERIOR APPROACH;  Surgeon: Paralee Cancel, MD;  [facility];  Service: Orthopedics;  Laterality: Left;  70 mins   TOTAL HIP ARTHROPLASTY Right 05/14/2021   Procedure: TOTAL HIP ARTHROPLASTY ANTERIOR APPROACH;  Surgeon: Paralee Cancel, MD;  [facility];   Service: Orthopedics;  Laterality: Right;    OB History     Gravida  2   Para  2   Term      Preterm      AB      Living         SAB      IAB      Ectopic      Multiple      Live Births               Home Medications    Prior to Admission medications   Medication Sig Start Date End Date Taking? Authorizing Provider  albuterol (VENTOLIN HFA) 108 (90 Base) MCG/ACT inhaler Inhale 1-2 puffs into the lungs every 6 (six) hours as needed for wheezing or shortness of breath. 10/29/21  Yes Raylene Everts, MD  azithromycin (ZITHROMAX Z-PAK) 250 MG tablet Take two pills today followed by one a day until gone  10/29/21  Yes Raylene Everts, MD  benzonatate (TESSALON) 200 MG capsule Take 1 capsule (200 mg total) by mouth 3 (three) times daily as needed for cough. 10/29/21  Yes Raylene Everts, MD  predniSONE (DELTASONE) 20 MG tablet Take 2 tablets (40 mg total) by mouth daily with breakfast. 10/29/21  Yes Raylene Everts, MD  amLODipine (NORVASC) 5 MG tablet Take 1 tablet (5 mg total) by mouth daily. 02/22/21   Nahser, Wonda Cheng, MD  apixaban (ELIQUIS) 5 MG TABS tablet Take 1 tablet (5 mg total) by mouth 2 (two) times daily. 10/02/21   Nahser, Wonda Cheng, MD  Calcium Carb-Cholecalciferol (CALCIUM 600+D3 PO) Take 1 tablet by mouth in the morning.    [provider]  metoprolol succinate (TOPROL XL) 25 MG 24 hr tablet Take 1 tablet (25 mg total) by mouth 2 (two) times daily. 02/22/21   Nahser, Wonda Cheng, MD  milk thistle 175 MG tablet Take 175 mg by mouth daily.    [provider]  Multiple Vitamin (MULTIVITAMIN WITH MINERALS) TABS tablet Take 1 tablet by mouth in the morning.    [provider]  pantoprazole (PROTONIX) 40 MG tablet Take 40 mg by mouth daily.    [provider]  Probiotic Product ([place]) Take 1 tablet by mouth daily.    [provider]  propranolol (INDERAL) 10 MG tablet Take 1 tablet (10 mg  total) by mouth 4 (four) times daily as needed. 11/10/18   Nahser, Wonda Cheng, MD  sucralfate (CARAFATE) 1 g tablet Take 1 g by mouth in the morning, at noon, and at bedtime.    [provider]  TURMERIC PO Take 1 capsule by mouth in the morning.    [provider]    Family History Family History  Problem Relation Age of Onset   Diabetes Mother    Hypertension Mother    Heart disease Mother    Sleep apnea Mother    Coronary artery disease Sister    Hypertension Sister    Colon cancer Neg Hx     Social History Social History   Tobacco Use   Smoking status: Never   Smokeless tobacco: Never  Vaping Use   Vaping Use: Never used  Substance Use Topics   Alcohol use: No   Drug use: No     Allergies   Other, Losartan, and Tramadol   Review of Systems Review of Systems See HPI  Physical Exam Triage Vital Signs ED Triage Vitals  Enc Vitals Group     BP 10/29/21 0927 (!) 152/86     Pulse Rate 10/29/21 0927 77     Resp 10/29/21 0927 18     Temp 10/29/21 0927 99.1 F (37.3 C)     Temp Source 10/29/21 0927 Oral     SpO2 10/29/21 0927 96 %     Weight --      Height --      Head Circumference --      Peak Flow --      Pain Score 10/29/21 0925 0     Pain Loc --      Pain Edu? --      Excl. in [place]? --    No data found.  Updated Vital Signs BP (!) 152/86 (BP Location: Right Arm)   Pulse 77   Temp 99.1 F (37.3 C) (Oral)   Resp 18   SpO2 96%      Physical Exam Constitutional:  General: She is not in acute distress.    Appearance: She is well-developed. She is ill-appearing.     Comments: Appears ill.  Appears tired.  Moderate overweight  HENT:     Head: Normocephalic and atraumatic.     Right Ear: Tympanic membrane normal.     Left Ear: Tympanic membrane normal.     Nose: Nose normal. No congestion.     Mouth/Throat:     Mouth: Mucous membranes are moist.     Pharynx: No posterior oropharyngeal erythema.  Eyes:     Conjunctiva/sclera:  Conjunctivae normal.     Pupils: Pupils are equal, round, and reactive to light.  Cardiovascular:     Rate and Rhythm: Normal rate.  Pulmonary:     Effort: Pulmonary effort is normal. No respiratory distress.     Breath sounds: Wheezing and rhonchi present.  Abdominal:     General: There is no distension.     Palpations: Abdomen is soft.  Musculoskeletal:        General: Normal range of motion.     Cervical back: Normal range of motion.  Skin:    General: Skin is warm and dry.  Neurological:     Mental Status: She is alert.  Psychiatric:        Mood and Affect: Mood normal.        Behavior: Behavior normal.      UC Treatments / Results  Labs (all labs ordered are listed, but only abnormal results are displayed) Labs Reviewed  RESP PANEL BY RT-PCR (FLU A&B, COVID) [id number]    EKG   Radiology No results found.  Procedures Procedures (including critical care time)  Medications Ordered in UC Medications - No data to display  Initial Impression / Assessment and Plan / UC Course  I have reviewed the triage vital signs and the nursing notes.  Pertinent labs & imaging results that were available during my care of the patient were reviewed by me and considered in my medical decision making (see chart for details).     Final Clinical Impressions(s) / UC Diagnoses   Final diagnoses:  Acute cough  Sinobronchitis  Mild intermittent extrinsic asthma with acute exacerbation     Discharge Instructions      Make sure you are drinking lots of water Take the azithromycin antibiotic as prescribed.  2 pills today then 1 a day until gone Take Tessalon as needed for cough Take prednisone once a day for 5 days I have refilled your inhaler Call for problems   ED Prescriptions     Medication Sig Dispense Auth. Provider   benzonatate (TESSALON) 200 MG capsule Take 1 capsule (200 mg total) by mouth 3 (three) times daily as needed for cough. 21 capsule Raylene Everts, MD    predniSONE (DELTASONE) 20 MG tablet Take 2 tablets (40 mg total) by mouth daily with breakfast. 10 tablet Raylene Everts, MD   azithromycin (ZITHROMAX Z-PAK) 250 MG tablet Take two pills today followed by one a day until gone 6 tablet Raylene Everts, MD   albuterol (VENTOLIN HFA) 108 (90 Base) MCG/ACT inhaler Inhale 1-2 puffs into the lungs every 6 (six) hours as needed for wheezing or shortness of breath. 18 g Raylene Everts, MD      PDMP not reviewed this encounter.   Raylene Everts, MD 10/29/21 1049

## 2021-10-29 NOTE — ED Triage Notes (Signed)
Pt c/o cough and nasal congestion since Friday. No known fever. Taking alkaseltzer, allergra D and delsym and mucinex prn. COVID neg at home Friday.

## 2021-10-29 NOTE — Discharge Instructions (Signed)
Make sure you are drinking lots of water Take the azithromycin antibiotic as prescribed.  2 pills today then 1 a day until gone Take Tessalon as needed for cough Take prednisone once a day for 5 days I have refilled your inhaler Call for problems

## 2021-11-05 ENCOUNTER — Ambulatory Visit
Admission: RE | Admit: 2021-11-05 | Discharge: 2021-11-05 | Disposition: A | Discharge status: Home / Self Care | Source: Ambulatory Visit | Attending: Family Medicine | Admitting: Family Medicine

## 2021-11-05 ENCOUNTER — Other Ambulatory Visit: Payer: Self-pay | Admitting: Family Medicine

## 2021-11-05 DIAGNOSIS — R062 Wheezing: Secondary | ICD-10-CM

## 2021-11-20 ENCOUNTER — Ambulatory Visit: Attending: Cardiovascular Disease | Admitting: Cardiovascular Disease

## 2021-11-20 ENCOUNTER — Encounter: Payer: Self-pay | Admitting: Cardiovascular Disease

## 2021-11-20 VITALS — BP 130/80 | HR 69 | Ht 65.0 in | Wt 224.8 lb

## 2021-11-20 DIAGNOSIS — R0602 Shortness of breath: Secondary | ICD-10-CM

## 2021-11-20 DIAGNOSIS — I480 Paroxysmal atrial fibrillation: Secondary | ICD-10-CM | POA: Diagnosis not present

## 2021-11-20 DIAGNOSIS — I10 Essential (primary) hypertension: Secondary | ICD-10-CM

## 2021-11-20 NOTE — Progress Notes (Signed)
Cardiology Office Note   Date:  11/20/2021   ID:  [name], [name] 09-26-1955, MRN [id number]  PCP:  London Pepper, MD  Cardiologist:   Mertie Moores, MD   Chief Complaint  Patient presents with   Congestive Heart Failure        Hypertension        Problem List 1. Atrial fib   - This patients CHA2DS2-VASc Score and unadjusted Ischemic Stroke Rate (% per year) is equal to 2.2 % stroke rate/year from a score of 2  Above score calculated as 1 point each if present [CHF, HTN, DM, Vascular=MI/PAD/Aortic Plaque, Age if 65-74, or Female] Above score calculated as 2 points each if present [Age > 75, or Stroke/TIA/TE]  2. Essential HTN 3. Grade 2 diastolic dysfunction  - normal LV function 4. Obstructive sleep apnea -   [name] is a 66 y.o. female who presents for atrial fib, She was seen in the ER with PAF Has had a dull head ache.   Echo shows normal LV systolic function Myoview shows no ischemia   July 20, 2015:  [name] is seen today for follow up visit for her paroxysmal atrial fib.  Has had 1 episode of palpitations - likely was PAF - related to stress.  Is watching the salt in her diet .  The Xarelto seems to be upsetting her stomach  Held it for several days and her stomach started bothering her again  March 06, 2016:  Doing well  Having some back pain  Had an episode of atrial fib 3 nights ago.   Work her up . Has OAS - uses CPAP but she had pulled it off. She put the CPAP mask back on   09/15/2016: [name] is seen back for follow-up evaluation. Wt is 228 ( down 9 lbs from last visit )  Has been having more palpitations.  Took some extra metoprolol which seems    Has not been using her CPAP because her mask has lots of condensation and drains back into there mask .  Has been tried on Lisinopril ( caused CP and cough) and was on Valsartan ( was recalled )  Doing well on the [facility]   March 18, 2017:   [name] is seen back today for  follow-up of her chronic diastolic congestive heart failure, paroxysmal atrial fibrillation Has had difficult losing weight Has night sweats - possbily due to menapause  BP is a little elevated.   Under lots of stress  - sister is in the hospital ( heavy smoker)  No dyspnea. Still has occasional palpitations - took an extra propranolol which helps   June 17, 2017:  [name] is seen back for an early work in visit Was seen by Dr. Martinique a month ago.  She was having episodes of chest pain and an elevated blood pressure. She had been seen in the emergency room complaining of chest pain and left arm pain.  Troponin levels were normal. Dr. Doug Sou opinion was that the chest pain was related to her elevated blood pressure.  She has had a normal Myoview study 2 years ago.  The plan is to repeat the Myoview study if she has recurrent symptoms.  BP is still  Moderately elevated  She has cut back on her salt.    Has seen a nutritionist.      August 03, 2017: [name] is seen today for follow-up of her hypertension and chronic diastolic congestive heart failure. BP readings have  been elevated.  Has been to the [facility]   she had some Hibiscus Tea yesterday  -  BP is normal today   Sept. 5, 2019: [name] is seen today for follow-up of her hypertension and chronic diastolic congestive heart failure.  She was recently seen in the emergency room for paroxysmal atrial fibrillation.  She has a history of obstructive sleep apnea but has not been wearing her CPAP mask.  Episode of A flutter  - woke her up at 5 am.   Sent to the ER  Aflutter resolved with IV cardiazem   February 01, 2018: [name] was seen in the emergency room in December, 2019.   Thought to have bronchitis[facility]  . Pressure is been well controlled. Received lots of meds , steroids. Is just now getting better .  Is wearing her CPAP  BP is well  controlled.  Has gained some weight Wt today is 231 lbs    ( weight in Sept. 2019 was 225)   Is on a [facility] .   Sept.  28, 2020   [name] is seen today for follow up of her PAF and HTN  Wt.239 lbs ( up 8 lbs since last year )  Was going to a trainer  But has gained wt with covid .  No dyspnea.  No cp,     Sept. 13, 2021 [name] is seen for follow up of her PAF, HTN Wt = 220 ( down 19 lbs)    - was 239 last year Has developed an injury of her left hip   Has had her covid vaccines ( 2)   Her left hip pain is causing her to not sleep well at night . Has had more episodes of Atib.  Uses her CPAP    Jan. 25, 2022 [name] is seen for pre op visit prior to hip replacement  Wt is 231 ( up 11 lbs )   Has occasional palpitations   July 30, 2020: [name] is seen for follow up of her PAF, HTN, chronic diastolic CHF, obesity  Had hip replacement several months ago Wt is 228  lbs ( down 3 lbs from Jan )   Is working on weight loss ,   eats popcorn every night - we discussed this in great detail.   Has had some palpitations  Drinks    Sept. 2, 2022: [name] is seen as a work in visit for increased palpitations She thinks she is back  in atrial fib  ECG today shows NSR  Palps would last a few seconds.  Would resolve,  then start back up Has been under stress. Not sleeping well Taking care of her older sister ( in a memory center )  We discussed setting boundaries with regard to visiting and caring for her sister.   Nov. 15, 2023  [name] is seen today for follow-up of her hypertension.  Blood pressure is fairly normal today.  Her diastolic pressure is mildly elevated. Wt is 225 pounds ( down 3 lbs from last year )   Has been having some dyspnea  Had caught a cold ,  bad cough, Was given prednisone, inhaler .  Was not COVID, no RSV  Is just now feeling better   Continues to have occasional episodes of PAF  Takes a propranolol which  helps convert her back to normal .  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 unabale to pass ETT with Sabra Heck 2; unable to visualize cord with MAC 3; intubating LMA used; uneventful intubation using Glidescope 2016, 2022   Diverticulosis, with history of diverticulitis of descending and sigmoid colon 03/03/2007   Dysrhythmia    atrial fib   Edema of both lower legs    Essential hypertension 06/25/2006   Qualifier: Diagnosis of  By: Tiney Rouge CMA, Ellison Hughs     Fatty liver    Gastritis    GERD 03/03/2007   Qualifier: Diagnosis of  By: Nils Pyle CMA (AAMA), Mearl Latin   Qualifier: Diagnosis of  By: Shane Crutch, [name]  pt denies at preop of 02/15/20    Hepatic steatosis, found on CT 03/03/2007       Hiatal hernia, small 03/28/2016   EGD by [facility] 12/09/12.    Hip pain    History of colonic polyps 04/21/2008   Last Colonoscopy was 12/09/12, Dr. Fuller Plan. No polyp at that time. Repeat in 5 years.    Internal hemorrhoids 03/03/2007   Last Colonoscopy was 12/09/12, Dr. Fuller Plan. Repeat in 5 years.    Joint pain    Lactose intolerance    Left adrenal mass ([place]) 05/12/2013   Menopausal vasomotor syndrome 03/28/2016   Morbid obesity ([place]) (BMI 35 plus 2 comorbidities) 03/06/2016   Comorbid conditions include HTN, HLD, hepatic steatosis, OA, GERD, OSA.   OSA (obstructive sleep apnea) 03/03/2007   cpap    PAF (paroxysmal atrial fibrillation) ([place]) 04/24/2015   Palpitations    PONV (postoperative  nausea and vomiting)    area on left forearm DO NOT PLACE IV OR STICK ? vascular"will bleed"   Umbilical hernia without obstruction or gangrene, fat containing 03/28/2016   Found on CT, 2017. No issues.    Past Surgical History:  Procedure Laterality Date   ABDOMINAL HYSTERECTOMY     APPENDECTOMY     BREAST REDUCTION SURGERY     BUNIONECTOMY     bilateral   CERVICAL FUSION     CHOLECYSTECTOMY N/A 10/01/2016   Procedure: LAPAROSCOPIC CHOLECYSTECTOMY;  Surgeon: Coralie Keens, MD;  Location: [facility];  Service: General;  Laterality: N/A;   COLONOSCOPY WITH PROPOFOL N/A 05/12/2019   Procedure: COLONOSCOPY WITH PROPOFOL;  Surgeon: Juanita Craver, MD;  Location: [facility];  Service: Endoscopy;  Laterality: N/A;   ESOPHAGOGASTRODUODENOSCOPY (EGD) WITH PROPOFOL N/A 09/20/2021   Procedure: ESOPHAGOGASTRODUODENOSCOPY (EGD) WITH PROPOFOL;  Surgeon: Carol Ada, MD;  Location: [facility];  Service: Gastroenterology;  Laterality: N/A;   MAXIMUM ACCESS (MAS)POSTERIOR LUMBAR INTERBODY FUSION (PLIF) 2 LEVEL N/A 09/27/2014   Procedure: Maximum Access Surgery Posterior Lumbar Interbody Fusion Lumbar three-four, Lumbar four-five;  Surgeon: Eustace Moore, MD;  Location: [facility];  Service: Neurosurgery;  Laterality: N/A;   POLYPECTOMY  05/12/2019   Procedure: POLYPECTOMY;  Surgeon: Juanita Craver, MD;  Location: [facility];  Service: Endoscopy;;   [place]  W/ BILATERAL SALPINGOOPHORECTOMY     TOTAL HIP ARTHROPLASTY Left 02/21/2020   Procedure: TOTAL HIP ARTHROPLASTY ANTERIOR APPROACH;  Surgeon: Paralee Cancel, MD;  Location: [facility];  Service: Orthopedics;  Laterality: Left;  70 mins   TOTAL HIP ARTHROPLASTY Right 05/14/2021   Procedure: TOTAL HIP ARTHROPLASTY ANTERIOR APPROACH;  Surgeon: Paralee Cancel, MD;  Location: [facility];  Service: Orthopedics;  Laterality: Right;     Current Outpatient Medications  Medication Sig Dispense Refill   albuterol (VENTOLIN HFA) 108  (90 Base) MCG/ACT inhaler Inhale 1-2 puffs into the lungs every 6 (six) hours as needed for wheezing or shortness of breath. 18 g 0   amLODipine (NORVASC) 5 MG tablet Take 1 tablet (5 mg total) by mouth daily. 90 tablet 3   apixaban (ELIQUIS) 5 MG TABS tablet Take 1 tablet (5 mg total) by mouth 2 (two) times daily. 28 tablet 0   Calcium Carb-Cholecalciferol (CALCIUM 600+D3 PO) Take 1 tablet by mouth in the morning.     metoprolol succinate (TOPROL XL) 25 MG 24 hr tablet Take 1 tablet (25 mg total) by mouth 2 (two) times daily. 180 tablet 3   Multiple Vitamin (MULTIVITAMIN WITH MINERALS) TABS tablet Take 1 tablet by mouth in the morning.     Probiotic Product (ULTRAFLORA IMMUNE HEALTH PO) Take 1 tablet by mouth daily.     propranolol (INDERAL) 10 MG tablet Take 1 tablet (10 mg total) by mouth 4 (four) times daily as needed. 360 tablet 3   TURMERIC PO Take 1 capsule by mouth in the morning.     milk thistle 175 MG tablet Take 175 mg by mouth daily. (Patient not taking: Reported on 11/20/2021)     No current facility-administered medications for this visit.    Allergies:   Other, Losartan, and Tramadol    Social History:  The patient  reports that she has never smoked. She has never used smokeless tobacco. She reports that she does not drink alcohol and does not use drugs.   Family History:  The patient's family history includes Coronary artery disease in her sister; Diabetes in her mother; Heart disease in her mother; Hypertension in her mother and sister; Sleep apnea in her mother.    ROS:  Please see the history of present illness.    Physical Exam: Blood pressure 130/80, pulse 69, height _0  (1.651 m), weight 224 lb 12.8 oz (102 kg), SpO2 95 %.       GEN:  middlle age female,  moderately obese female, in no acute distress Body mass index is 37.41 kg/m.  HEENT: Normal NECK: No JVD; No carotid bruits LYMPHATICS: No lymphadenopathy CARDIAC: irreg. Irreg.  RESPIRATORY:  Clear to  auscultation without rales, wheezing or rhonchi  ABDOMEN: Soft, non-tender, non-distended MUSCULOSKELETAL:  No edema; No deformity  SKIN: Warm and dry NEUROLOGIC:  Alert and oriented x 3    EKG:   Nov. 15, 2023 .  NSR with sinus arrhythmia.. minimal voltage criteria     Recent Labs: 05/05/2021: ALT 20 05/15/2021: BUN 12; Creatinine, Ser 0.91; Potassium 4.3; Sodium 137 05/16/2021: Hemoglobin 11.8; Platelets 295    Lipid Panel    Component Value Date/Time   CHOL 137 09/07/2020 1349   TRIG 118 09/07/2020 1349   HDL 47 09/07/2020 1349   CHOLHDL 2.9 09/07/2020 1349   CHOLHDL 3 04/22/2016 1234   VLDL 20.6 04/22/2016 1234   LDLCALC 69 09/07/2020 1349      Wt Readings from Last 3 Encounters:  11/20/21  224 lb 12.8 oz (102 kg)  09/20/21 220 lb (99.8 kg)  05/14/21 215 lb (97.5 kg)      Other studies Reviewed: Additional studies/ records that were reviewed today include: . Review of the above records demonstrates:    ASSESSMENT AND PLAN:  1.  Paroxysmal atrial fibrillation:    Is maintaining NSR .     2.   Essential hypertension:     BP is well controlled.        3.    Chronic diastolic congestive heart failure:      Ive  advised weight loss.  Still very short of breath .  May be from recent viral infection  Her last echo was in 2017.  Will repeat her echo   4.  Chest pain:    5.  Obesity:     Advised weight loss. Will follow up with Christen Bame, NP in 6 months     Current medicines are reviewed at length with the patient today.  The patient does not have concerns regarding medicines.  Labs/ tests ordered today include:   No orders of the defined types were placed in this encounter.    Disposition:         Mertie Moores, MD  11/20/2021 2:39 PM    [address], [place], [place]  [address] Phone: [phone]; Fax: [phone]

## 2021-11-20 NOTE — Patient Instructions (Signed)
Medication Instructions:  NO CHANGES *If you need a refill on your cardiac medications before your next appointment, please call your pharmacy*   Lab Work: NONE If you have labs (blood work) drawn today and your tests are completely normal, you will receive your results only by: [place] (if you have MyChart) OR A paper copy in the mail If you have any lab test that is abnormal or we need to change your treatment, we will call you to review the results.   Testing/Procedures: Your physician has requested that you have an echocardiogram. Echocardiography is a painless test that uses sound waves to create images of your heart. It provides your doctor with information about the size and shape of your heart and how well your heart's chambers and valves are working. This procedure takes approximately one hour. There are no restrictions for this procedure. Please do NOT wear cologne, perfume, aftershave, or lotions (deodorant is allowed). Please arrive 15 minutes prior to your appointment time.    Follow-Up: At [facility], you and your health needs are our priority.  As part of our continuing mission to provide you with exceptional heart care, we have created designated Provider Care Teams.  These Care Teams include your primary Cardiologist (physician) and Advanced Practice Providers (APPs -  Physician Assistants and Nurse Practitioners) who all work together to provide you with the care you need, when you need it.  We recommend signing up for the patient portal called "MyChart".  Sign up information is provided on this After Visit Summary.  MyChart is used to connect with patients for Virtual Visits (Telemedicine).  Patients are able to view lab/test results, encounter notes, upcoming appointments, etc.  Non-urgent messages can be sent to your provider as well.   To learn more about what you can do with MyChart, go to [email or web address].    Your next appointment:   6  month(s)  The format for your next appointment:   In Person  Provider:  Christen Bame NP Other Instructions NONE  Important Information About Sugar

## 2021-12-16 ENCOUNTER — Ambulatory Visit (HOSPITAL_COMMUNITY): Attending: Cardiovascular Disease

## 2021-12-16 DIAGNOSIS — I10 Essential (primary) hypertension: Secondary | ICD-10-CM | POA: Diagnosis not present

## 2021-12-16 DIAGNOSIS — R0602 Shortness of breath: Secondary | ICD-10-CM

## 2021-12-16 DIAGNOSIS — I480 Paroxysmal atrial fibrillation: Secondary | ICD-10-CM | POA: Diagnosis not present

## 2021-12-16 LAB — ECHOCARDIOGRAM COMPLETE
Area-P 1/2: 3.5 cm2
S' Lateral: 2.6 cm

## 2022-01-05 ENCOUNTER — Other Ambulatory Visit: Payer: Self-pay

## 2022-01-05 ENCOUNTER — Emergency Department (HOSPITAL_COMMUNITY)

## 2022-01-05 ENCOUNTER — Emergency Department (HOSPITAL_COMMUNITY)
Admission: EM | Admit: 2022-01-05 | Discharge: 2022-01-05 | Disposition: A | Discharge status: Home / Self Care | Attending: Emergency Medicine | Admitting: Emergency Medicine

## 2022-01-05 DIAGNOSIS — J45909 Unspecified asthma, uncomplicated: Secondary | ICD-10-CM | POA: Diagnosis not present

## 2022-01-05 DIAGNOSIS — Z79899 Other long term (current) drug therapy: Secondary | ICD-10-CM | POA: Insufficient documentation

## 2022-01-05 DIAGNOSIS — I480 Paroxysmal atrial fibrillation: Secondary | ICD-10-CM | POA: Diagnosis not present

## 2022-01-05 DIAGNOSIS — W01198A Fall on same level from slipping, tripping and stumbling with subsequent striking against other object, initial encounter: Secondary | ICD-10-CM | POA: Diagnosis not present

## 2022-01-05 DIAGNOSIS — Z7951 Long term (current) use of inhaled steroids: Secondary | ICD-10-CM | POA: Diagnosis not present

## 2022-01-05 DIAGNOSIS — S60212A Contusion of left wrist, initial encounter: Secondary | ICD-10-CM | POA: Insufficient documentation

## 2022-01-05 DIAGNOSIS — I10 Essential (primary) hypertension: Secondary | ICD-10-CM | POA: Insufficient documentation

## 2022-01-05 DIAGNOSIS — S0093XA Contusion of unspecified part of head, initial encounter: Secondary | ICD-10-CM | POA: Insufficient documentation

## 2022-01-05 DIAGNOSIS — S0990XA Unspecified injury of head, initial encounter: Secondary | ICD-10-CM | POA: Diagnosis present

## 2022-01-05 DIAGNOSIS — Z7901 Long term (current) use of anticoagulants: Secondary | ICD-10-CM | POA: Diagnosis not present

## 2022-01-05 DIAGNOSIS — W19XXXA Unspecified fall, initial encounter: Secondary | ICD-10-CM

## 2022-01-05 MED ORDER — ACETAMINOPHEN 500 MG PO TABS
1000.0000 mg | ORAL_TABLET | Freq: Once | ORAL | Status: AC
  Administered 2022-01-05: 1000 mg via ORAL
  Filled 2022-01-05: qty 2

## 2022-01-05 MED ORDER — ACETAMINOPHEN 500 MG PO TABS
ORAL_TABLET | ORAL | Status: AC
  Filled 2022-01-05: qty 1

## 2022-01-05 NOTE — ED Triage Notes (Addendum)
Pt BIB EMS due to a fall. Pt was outback and slipped on floor, pt hit head, left wrist sweling, no LOC. Pt is on elliquis for afib. Pt has small abrasion on left ear lobe from earring. Pt in c-collar. Pt is axox4. VSS.

## 2022-01-05 NOTE — ED Notes (Signed)
Pt ambulated to the bathroom. Pt gait is steady and is able to walk independently.

## 2022-01-05 NOTE — ED Provider Notes (Signed)
[facility] EMERGENCY DEPARTMENT Provider Note   CSN: [id number] Arrival date & time: 01/05/22  1428     History  Chief Complaint  Patient presents with   [name] is a 66 y.o. female.  With past medical history of obesity, paroxysmal atrial fibrillation anticoagulated on Eliquis, HFpEF, GERD, asthma, hypertension who presents to the emergency department with fall.  Patient states that she was walking into [employer] today after going to church when she slipped and fell inside.  She states that she fell onto her left side.  She did strike her head on a seat but denies losing consciousness.  States that she thinks she tried to catch herself with her left arm, and is having pain.  Denies pain to the chest or abdomen or back or lower extremities.  She is anticoagulated on Eliquis.  Denies prodromal symptoms prior to falling such as lightheadedness or dizziness, chest pain, shortness of breath, palpitations.   Fall       Home Medications Prior to Admission medications   Medication Sig Start Date End Date Taking? Authorizing Provider  albuterol (VENTOLIN HFA) 108 (90 Base) MCG/ACT inhaler Inhale 1-2 puffs into the lungs every 6 (six) hours as needed for wheezing or shortness of breath. 10/29/21   Raylene Everts, MD  amLODipine (NORVASC) 5 MG tablet Take 1 tablet (5 mg total) by mouth daily. 02/22/21   Nahser, Wonda Cheng, MD  apixaban (ELIQUIS) 5 MG TABS tablet Take 1 tablet (5 mg total) by mouth 2 (two) times daily. 10/02/21   Nahser, Wonda Cheng, MD  Calcium Carb-Cholecalciferol (CALCIUM 600+D3 PO) Take 1 tablet by mouth in the morning.    [provider]  metoprolol succinate (TOPROL XL) 25 MG 24 hr tablet Take 1 tablet (25 mg total) by mouth 2 (two) times daily. 02/22/21   Nahser, Wonda Cheng, MD  milk thistle 175 MG tablet Take 175 mg by mouth daily. Patient not taking: Reported on 11/20/2021    [provider]  Multiple Vitamin  (MULTIVITAMIN WITH MINERALS) TABS tablet Take 1 tablet by mouth in the morning.    [provider]  Probiotic Product ([place]) Take 1 tablet by mouth daily.    [provider]  propranolol (INDERAL) 10 MG tablet Take 1 tablet (10 mg total) by mouth 4 (four) times daily as needed. 11/10/18   Nahser, Wonda Cheng, MD  TURMERIC PO Take 1 capsule by mouth in the morning.    [provider]      Allergies    Other, Losartan, and Tramadol    Review of Systems   Review of Systems  Musculoskeletal:  Positive for arthralgias.  All other systems reviewed and are negative.   Physical Exam Updated Vital Signs BP (!) 156/77   Pulse 69   Temp 98.2 F (36.8 C) (Oral)   Resp 16   Ht '5\' 5"'$  (1.651 m)   Wt 99.8 kg   SpO2 100%   BMI 36.61 kg/m  Physical Exam Vitals and nursing note reviewed.  Constitutional:      General: She is not in acute distress.    Appearance: Normal appearance. She is obese. She is not ill-appearing.  HENT:     Head: Normocephalic.     Comments: Small amount of blood on the left earlobe from earring    Nose: Nose normal.     Mouth/Throat:     Mouth: Mucous membranes are moist.  Pharynx: Oropharynx is clear.  Eyes:     General: No scleral icterus.    Extraocular Movements: Extraocular movements intact.     Pupils: Pupils are equal, round, and reactive to light.  Neck:     Comments: C-collar in place Cardiovascular:     Rate and Rhythm: Normal rate and regular rhythm.     Pulses: Normal pulses.     Heart sounds: No murmur heard. Pulmonary:     Effort: Pulmonary effort is normal. No respiratory distress.     Breath sounds: Normal breath sounds.  Abdominal:     General: Bowel sounds are normal. There is no distension.     Palpations: Abdomen is soft.     Tenderness: There is no abdominal tenderness.  Musculoskeletal:        General: Tenderness present.     Comments: Left wrist tenderness to palpation and mild  amount of bruising.  Full range of motion.  Compartments are soft.  She also has tenderness palpation of the left elbow with full range of motion. Radial pulse 2+, sensation intact  Skin:    General: Skin is warm and dry.     Capillary Refill: Capillary refill takes less than 2 seconds.     Findings: Bruising present.  Neurological:     General: No focal deficit present.     Mental Status: She is alert and oriented to person, place, and time. Mental status is at baseline.  Psychiatric:        Mood and Affect: Mood normal.        Behavior: Behavior normal.        Thought Content: Thought content normal.        Judgment: Judgment normal.     ED Results / Procedures / Treatments   Labs (all labs ordered are listed, but only abnormal results are displayed) Labs Reviewed - No data to display  EKG None  Radiology CT Head Wo Contrast  Result Date: 01/05/2022 CLINICAL DATA:  Trauma hit head EXAM: CT HEAD WITHOUT CONTRAST CT CERVICAL SPINE WITHOUT CONTRAST TECHNIQUE: Multidetector CT imaging of the head and cervical spine was performed following the standard protocol without intravenous contrast. Multiplanar CT image reconstructions of the cervical spine were also generated. RADIATION DOSE REDUCTION: This exam was performed according to the departmental dose-optimization program which includes automated exposure control, adjustment of the mA and/or kV according to patient size and/or use of iterative reconstruction technique. COMPARISON:  Radiograph 02/05/2016 FINDINGS: CT HEAD FINDINGS Brain: No acute territorial infarction, hemorrhage or intracranial mass. Patchy white matter hypodensity likely chronic small vessel ischemic change. Nonenlarged ventricles. Vascular: No hyperdense vessels.  No unexpected calcification Skull: Normal. Negative for fracture or focal lesion. Sinuses/Orbits: Mucosal thickening in the right frontal sinus Other: None CT CERVICAL SPINE FINDINGS Alignment: No subluxation.   Facet alignment is within normal limits. Skull base and vertebrae: No acute fracture. No primary bone lesion or focal pathologic process. Soft tissues and spinal canal: No prevertebral fluid or swelling. No visible canal hematoma. Upper normal prevertebral soft tissue thickness but stable as compared with prior CT. Disc levels: Anterior fusion hardware at C6-C7 with solid bone fusion. Multilevel degenerative change. Moderate severe disc space narrowing and degenerative change C4-C5 and C5-C6. Facet degenerative changes at multiple levels with foraminal stenosis. Upper chest: Negative. Other: None IMPRESSION: No CT evidence for acute intracranial abnormality. Mild chronic small vessel ischemic changes of the white matter. Degenerative changes of the cervical spine with postsurgical changes at C6-C7. No  acute osseous abnormality. Electronically Signed   By: Donavan Foil M.D.   On: 01/05/2022 17:29   CT Cervical Spine Wo Contrast  Result Date: 01/05/2022 CLINICAL DATA:  Trauma hit head EXAM: CT HEAD WITHOUT CONTRAST CT CERVICAL SPINE WITHOUT CONTRAST TECHNIQUE: Multidetector CT imaging of the head and cervical spine was performed following the standard protocol without intravenous contrast. Multiplanar CT image reconstructions of the cervical spine were also generated. RADIATION DOSE REDUCTION: This exam was performed according to the departmental dose-optimization program which includes automated exposure control, adjustment of the mA and/or kV according to patient size and/or use of iterative reconstruction technique. COMPARISON:  Radiograph 02/05/2016 FINDINGS: CT HEAD FINDINGS Brain: No acute territorial infarction, hemorrhage or intracranial mass. Patchy white matter hypodensity likely chronic small vessel ischemic change. Nonenlarged ventricles. Vascular: No hyperdense vessels.  No unexpected calcification Skull: Normal. Negative for fracture or focal lesion. Sinuses/Orbits: Mucosal thickening in the right  frontal sinus Other: None CT CERVICAL SPINE FINDINGS Alignment: No subluxation.  Facet alignment is within normal limits. Skull base and vertebrae: No acute fracture. No primary bone lesion or focal pathologic process. Soft tissues and spinal canal: No prevertebral fluid or swelling. No visible canal hematoma. Upper normal prevertebral soft tissue thickness but stable as compared with prior CT. Disc levels: Anterior fusion hardware at C6-C7 with solid bone fusion. Multilevel degenerative change. Moderate severe disc space narrowing and degenerative change C4-C5 and C5-C6. Facet degenerative changes at multiple levels with foraminal stenosis. Upper chest: Negative. Other: None IMPRESSION: No CT evidence for acute intracranial abnormality. Mild chronic small vessel ischemic changes of the white matter. Degenerative changes of the cervical spine with postsurgical changes at C6-C7. No acute osseous abnormality. Electronically Signed   By: Donavan Foil M.D.   On: 01/05/2022 17:29   DG Elbow Complete Left  Result Date: 01/05/2022 CLINICAL DATA:  Fall EXAM: LEFT ELBOW - COMPLETE 3+ VIEW COMPARISON:  None Available. FINDINGS: No definitive fracture or malalignment. Possible small elbow effusion. Soft tissues are unremarkable. IMPRESSION: Possible small elbow effusion. No definitive fracture is seen, however occult osseous injury not excluded. Consider immobilization and short-term radiographic follow-up Electronically Signed   By: Donavan Foil M.D.   On: 01/05/2022 16:28   DG Hip Unilat With Pelvis 2-3 Views Left  Result Date: 01/05/2022 CLINICAL DATA:  Fall EXAM: DG HIP (WITH OR WITHOUT PELVIS) 2-3V LEFT COMPARISON:  05/14/2021, FINDINGS: Hardware in the lumbar spine. Pubic symphysis and rami appear intact. Bilateral hip replacements with intact hardware and normal alignment. No fracture is seen. IMPRESSION: Bilateral hip replacements. No acute osseous abnormality. Electronically Signed   By: Donavan Foil M.D.    On: 01/05/2022 16:27   DG Wrist Complete Left  Result Date: 01/05/2022 CLINICAL DATA:  Fall EXAM: LEFT WRIST - COMPLETE 3+ VIEW COMPARISON:  None Available. FINDINGS: No fracture or malalignment. Mild radiocarpal and first [facility] joint degenerative change. IMPRESSION: No acute osseous abnormality. Electronically Signed   By: Donavan Foil M.D.   On: 01/05/2022 16:26    Procedures Procedures    Medications Ordered in ED Medications  acetaminophen (TYLENOL) tablet 1,000 mg (500 mg Oral Not Given 01/05/22 1617)    ED Course/ Medical Decision Making/ A&P                           Medical Decision Making Amount and/or Complexity of Data Reviewed Radiology: ordered.  Risk OTC drugs.  Initial Impression and Ddx 66 year old female who  presents to the emergency department after a mechanical fall.  Will obtain CT head and C-spine given that she is on Eliquis.  Also obtaining plain films of the left wrist, elbow, hip.  Do not feel that she needs laboratory workup at this time. Patient PMH that increases complexity of ED encounter: Atrial fibrillation, hypertension, obesity, heart failure  Interpretation of Diagnostics I independent reviewed and interpreted the labs as followed: Not indicated  - I independently visualized the following imaging with scope of interpretation limited to determining acute life threatening conditions related to emergency care: CT head and C-spine negative.  Plain film of the left wrist negative, plain film of the left hip with no acute fractures.  Plain film of the left elbow shows possible small joint effusion  Patient Reassessment and Ultimate Disposition/Management On reassessment the patient is stable.  We discussed rice therapy at home for her elbow.  Do not feel that this needs further workup at this time.  I removed her c-collar.  I will provide her with follow-up to an orthopedic if she has ongoing pain symptoms in the left elbow however do not feel that it  is acutely fractured at this time.  She has full range of motion.  There is no significant swelling.  She is mildly tender to palpation.  Would prefer conservative therapy initially and she can follow-up as needed in the outpatient setting.  She is agreeable to this plan.  The patient has been appropriately medically screened and/or stabilized in the ED. I have low suspicion for any other emergent medical condition which would require further screening, evaluation or treatment in the ED or require inpatient management. At time of discharge the patient is hemodynamically stable and in no acute distress. I have discussed work-up results and diagnosis with patient and answered all questions. Patient is agreeable with discharge plan. We discussed strict return precautions for returning to the emergency department and they verbalized understanding.     Patient management required discussion with the following services or consulting groups:  None  Complexity of Problems Addressed Acute complicated illness or Injury  Additional Data Reviewed and Analyzed Further history obtained from: Further history from spouse/family member and Care Everywhere  Patient Encounter Risk Assessment None  Final Clinical Impression(s) / ED Diagnoses Final diagnoses:  Fall, initial encounter    Rx / DC Orders ED Discharge Orders     None         Mickie Hillier, PA-C 01/05/22 [place], [place], DO 01/05/22 2007

## 2022-01-05 NOTE — Discharge Instructions (Signed)
You were seen in the emergency department today after a fall. All of your imaging looks okay. You will be sore today and into the next few days. You may use tylenol or ibuprofen for pain relief. Additionally please use ice over the affected areas to reduce swelling and pain. If you have ongoing symptoms of pain in your elbow I have provided you with information to see a local orthopedist. Please give them a call to set up an appointed as needed. Please return for significantly worsening symptoms.

## 2022-02-05 LAB — HM MAMMOGRAPHY

## 2022-02-26 ENCOUNTER — Encounter: Payer: Self-pay | Admitting: Cardiovascular Disease

## 2022-03-02 ENCOUNTER — Encounter: Payer: Self-pay | Admitting: Cardiovascular Disease

## 2022-03-02 NOTE — Progress Notes (Signed)
This encounter was created in error - please disregard.

## 2022-03-03 ENCOUNTER — Ambulatory Visit: Admitting: Cardiovascular Disease

## 2022-03-24 ENCOUNTER — Encounter: Payer: Self-pay | Admitting: Family Medicine

## 2022-03-24 ENCOUNTER — Ambulatory Visit (INDEPENDENT_AMBULATORY_CARE_PROVIDER_SITE_OTHER): Admitting: Family Medicine

## 2022-03-24 VITALS — BP 152/78 | HR 65 | Temp 97.8°F | Resp 18 | Ht 65.0 in | Wt 234.0 lb

## 2022-03-24 DIAGNOSIS — I10 Essential (primary) hypertension: Secondary | ICD-10-CM

## 2022-03-24 DIAGNOSIS — M25422 Effusion, left elbow: Secondary | ICD-10-CM | POA: Diagnosis not present

## 2022-03-24 DIAGNOSIS — F339 Major depressive disorder, recurrent, unspecified: Secondary | ICD-10-CM

## 2022-03-24 DIAGNOSIS — I5032 Chronic diastolic (congestive) heart failure: Secondary | ICD-10-CM

## 2022-03-24 DIAGNOSIS — Z7689 Persons encountering health services in other specified circumstances: Secondary | ICD-10-CM | POA: Diagnosis not present

## 2022-03-24 DIAGNOSIS — E278 Other specified disorders of adrenal gland: Secondary | ICD-10-CM

## 2022-03-24 DIAGNOSIS — I209 Angina pectoris, unspecified: Secondary | ICD-10-CM

## 2022-03-24 NOTE — Progress Notes (Signed)
New Patient Office Visit  Subjective    Patient ID: [name], female    DOB: 1955-01-13  Age: 67 y.o. MRN: [id number]  CC:  Chief Complaint  Patient presents with   Establish Care    Patient states that she fell in December and injured her left elbow. She now has a (knot) on her elbow that she states doesn't really hurt but is sore from time to time.    HPI [name] presents to establish care. Pt is new to me.  Pt reports a fall back in Jan 05, 2022. She she had imaging done and showed an effusion. Pt reports it's gotten bigger since December. She uses warm compresses for this. Not taking anything. Pain is a 8/10 when she bumps it against something accidentally.  Pt has hx of A fib and is taking Eliquis, Metoprolol 25 mg BID and Propranolol every 6 hours prn for palpitations.  Pt has HTN and taking Amlodipine 5mg  daily. Not checking her blood pressure at home.  Pt reports weight gain. She has tried [employer]. Not able to exercise right now as her husband has had a MI 3 weeks ago. She reports she quit her job to help take care of him.  [place] Office Visit from 01/25/2020 in [facility] Total Score 15      Pt reports her husband coded at home. She says she got home from work and cooked dinner. She reports her husband told her his chest was hurting. She gave him some Tums and ginger ale thinking it was indigestion. She took him to the ER due to worsening chest pain. While in the ER waiting room, a code was called and he was having an MI. Pt reports this has been stressful and she's been taking care of him. She says this is why she's been stressed.    Outpatient Encounter Medications as of 03/24/2022  Medication Sig   albuterol (VENTOLIN HFA) 108 (90 Base) MCG/ACT inhaler Inhale 1-2 puffs into the lungs every 6 (six) hours as needed for wheezing or shortness of breath.   amLODipine (NORVASC) 5 MG tablet Take 1  tablet (5 mg total) by mouth daily.   apixaban (ELIQUIS) 5 MG TABS tablet Take 1 tablet (5 mg total) by mouth 2 (two) times daily.   Calcium Carb-Cholecalciferol (CALCIUM 600+D3 PO) Take 1 tablet by mouth in the morning.   metoprolol succinate (TOPROL XL) 25 MG 24 hr tablet Take 1 tablet (25 mg total) by mouth 2 (two) times daily.   Multiple Vitamin (MULTIVITAMIN WITH MINERALS) TABS tablet Take 1 tablet by mouth in the morning.   Probiotic Product (ULTRAFLORA IMMUNE HEALTH PO) Take 1 tablet by mouth daily.   propranolol (INDERAL) 10 MG tablet Take 1 tablet (10 mg total) by mouth 4 (four) times daily as needed.   TURMERIC PO Take 1 capsule by mouth in the morning.   milk thistle 175 MG tablet Take 175 mg by mouth daily. (Patient not taking: Reported on 11/20/2021)   No facility-administered encounter medications on file as of 03/24/2022.    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 unabale to pass ETT with Sabra Heck 2; unable to visualize cord with MAC 3; intubating LMA used; uneventful intubation using Glidescope 2016, 2022   Diverticulosis, with history of diverticulitis of descending and sigmoid colon 03/03/2007   Dysrhythmia    atrial fib   Edema of both lower legs    Essential hypertension 06/25/2006   Qualifier: Diagnosis of  By: Tiney Rouge CMA, Ellison Hughs     Fatty liver    Gastritis    GERD 03/03/2007   Qualifier: Diagnosis of  By: Nils Pyle CMA (AAMA), Mearl Latin   Qualifier: Diagnosis of  By: Shane Crutch, [name]  pt denies at preop of  02/15/20    Hepatic steatosis, found on CT 03/03/2007       Hiatal hernia, small 03/28/2016   EGD by [facility] 12/09/12.    Hip pain    History of colonic polyps 04/21/2008   Last Colonoscopy was 12/09/12, Dr. Fuller Plan. No polyp at that time. Repeat in 5 years.    Internal hemorrhoids 03/03/2007   Last Colonoscopy was 12/09/12, Dr. Fuller Plan. Repeat in 5 years.    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   OSA (obstructive sleep apnea) 03/03/2007   cpap    PAF (paroxysmal atrial fibrillation) ([place]) 04/24/2015   Palpitations    PONV (postoperative nausea and vomiting)    area on left forearm DO NOT PLACE IV OR STICK ? vascular"will bleed"   Umbilical hernia without obstruction or gangrene, fat containing 03/28/2016   Found on CT, 2017. No issues.    Past Surgical History:  Procedure Laterality Date   ABDOMINAL HYSTERECTOMY     APPENDECTOMY     BREAST REDUCTION SURGERY     BUNIONECTOMY     bilateral   CERVICAL FUSION     CHOLECYSTECTOMY N/A 10/01/2016   Procedure: LAPAROSCOPIC CHOLECYSTECTOMY;  Surgeon: Coralie Keens, MD;  Location: [place];  Service: General;  Laterality: N/A;   COLONOSCOPY WITH PROPOFOL N/A 05/12/2019   Procedure: COLONOSCOPY WITH PROPOFOL;  Surgeon: Juanita Craver, MD;  Location: [facility];  Service: Endoscopy;  Laterality: N/A;   ESOPHAGOGASTRODUODENOSCOPY (EGD) WITH PROPOFOL N/A 09/20/2021   Procedure: ESOPHAGOGASTRODUODENOSCOPY (EGD) WITH PROPOFOL;  Surgeon: Carol Ada, MD;  Location: [facility];  Service: Gastroenterology;  Laterality: N/A;   MAXIMUM ACCESS (MAS)POSTERIOR LUMBAR INTERBODY FUSION (PLIF) 2 LEVEL N/A 09/27/2014   Procedure: Maximum Access Surgery Posterior Lumbar Interbody Fusion Lumbar three-four, Lumbar four-five;  Surgeon: Eustace Moore, MD;   Location: [place] [facility];  Service: Neurosurgery;  Laterality: N/A;   POLYPECTOMY  05/12/2019   Procedure: POLYPECTOMY;  Surgeon: Juanita Craver, MD;  Location: [facility];  Service: Endoscopy;;   SPINE SURGERY     TOTAL ABDOMINAL HYSTERECTOMY W/ BILATERAL SALPINGOOPHORECTOMY     TOTAL HIP ARTHROPLASTY Left 02/21/2020   Procedure: TOTAL HIP ARTHROPLASTY ANTERIOR APPROACH;  Surgeon: Paralee Cancel, MD;  Location: [facility];  Service: Orthopedics;  Laterality: Left;  70 mins   TOTAL HIP ARTHROPLASTY Right 05/14/2021   Procedure: TOTAL HIP ARTHROPLASTY ANTERIOR APPROACH;  Surgeon: Paralee Cancel, MD;  Location: [facility];  Service: Orthopedics;  Laterality: Right;  Family History  Problem Relation Age of Onset   Diabetes Mother    Hypertension Mother    Heart disease Mother    Sleep apnea Mother    Coronary artery disease Sister    Hypertension Sister    Colon cancer Neg Hx     Social History   Socioeconomic History   Marital status: Married    Spouse name: Shanon Brow   Number of children: 2   Years of education: Not on file   Highest education level: Not on file  Occupational History   Occupation: Stay at Home   Occupation: ACCOUNTING  Tobacco Use   Smoking status: Never   Smokeless tobacco: Never  Vaping Use   Vaping Use: Never used  Substance and Sexual Activity   Alcohol use: No   Drug use: No   Sexual activity: Yes  Other Topics Concern   Not on file  Social History Narrative   Not on file   Social Determinants of Health   Financial Resource Strain: Not on file  Food Insecurity: Not on file  Transportation Needs: Not on file  Physical Activity: Not on file  Stress: Not on file  Social Connections: Not on file  Intimate Partner Violence: Not on file    Review of Systems  Musculoskeletal:  Positive for joint pain.  All other systems reviewed and are negative.       Objective    BP (!) 152/78   Pulse 65   Temp 97.8 F (36.6 C) (Oral)   Resp 18   Ht 5\' 5"  (1.651  m)   Wt 234 lb (106.1 kg)   SpO2 97%   BMI 38.94 kg/m   Physical Exam Vitals and nursing note reviewed.  Constitutional:      Appearance: Normal appearance. She is normal weight.  HENT:     Head: Normocephalic and atraumatic.     Right Ear: External ear normal.     Left Ear: External ear normal.     Nose: Nose normal.     Mouth/Throat:     Mouth: Mucous membranes are moist.  Eyes:     Pupils: Pupils are equal, round, and reactive to light.  Cardiovascular:     Rate and Rhythm: Normal rate and regular rhythm.     Pulses: Normal pulses.     Heart sounds: Normal heart sounds.  Pulmonary:     Effort: Pulmonary effort is normal.     Breath sounds: Normal breath sounds.  Abdominal:     General: Abdomen is flat.  Musculoskeletal:        General: Swelling present. Normal range of motion.     Comments: Left elbow effusion  Skin:    Capillary Refill: Capillary refill takes less than 2 seconds.  Neurological:     General: No focal deficit present.     Mental Status: She is alert and oriented to person, place, and time. Mental status is at baseline.  Psychiatric:        Mood and Affect: Mood normal.        Behavior: Behavior normal.        Thought Content: Thought content normal.        Judgment: Judgment normal.        Assessment & Plan:   Problem List Items Addressed This Visit   None  Encounter to establish care with new doctor  Essential hypertension  Effusion of bursa of left elbow -     Ambulatory referral to Sports Medicine  Advised pt to keep blood pressure log at home and return in 2-3 weeks for follow up. Physical at that time Pt has had effusion of left elbow since December 2023 and never resolved. Refer to Dr T for further evaluation and likely drainage.   No follow-ups on file.   Leeanne Rio, MD

## 2022-04-01 ENCOUNTER — Other Ambulatory Visit (INDEPENDENT_AMBULATORY_CARE_PROVIDER_SITE_OTHER): Payer: PRIVATE HEALTH INSURANCE

## 2022-04-01 ENCOUNTER — Ambulatory Visit (INDEPENDENT_AMBULATORY_CARE_PROVIDER_SITE_OTHER): Admitting: Sports Medicine

## 2022-04-01 DIAGNOSIS — M7022 Olecranon bursitis, left elbow: Secondary | ICD-10-CM

## 2022-04-01 NOTE — Assessment & Plan Note (Signed)
Pleasant 67 year old female, had a fall back in December, has subsequently developed persistent and worsening swelling over the olecranon left side. She has good motion and good strength in her elbow, visibly and palpably tense olecranon bursitis. Aspiration, injection, return to see me in 6 weeks.

## 2022-04-01 NOTE — Progress Notes (Signed)
    Procedures performed today:    Procedure: Real-time Ultrasound Guided aspiration/injection of left olecranon bursitis Device: Samsung HS60  Verbal informed consent obtained.  Time-out conducted.  Noted no overlying erythema, induration, or other signs of local infection.  Skin prepped in a sterile fashion.  Local anesthesia: Topical Ethyl chloride.  With sterile technique and under real time ultrasound guidance: Noted enlarged olecranon bursa, I aspirated 4 mL of serosanguineous fluid from a multiseptated olecranon bursitis, syringe switched and 1 cc kenalog 40, 1 cc lidocaine injected easily. Completed without difficulty  Advised to call if fevers/chills, erythema, induration, drainage, or persistent bleeding.  Images permanently stored and available for review in PACS.  Impression: Technically successful ultrasound guided aspiration/injection.  Independent interpretation of notes and tests performed by another provider:   None.  Brief History, Exam, Impression, and Recommendations:    Olecranon bursitis of left elbow Pleasant 67 year old female, had a fall back in December, has subsequently developed persistent and worsening swelling over the olecranon left side. She has good motion and good strength in her elbow, visibly and palpably tense olecranon bursitis. Aspiration, injection, return to see me in 6 weeks.    ____________________________________________ Gwen Her. Dianah Field, M.D., ABFM., CAQSM., AME. Primary Care and Sports Medicine [facility]  Adjunct Professor of [employer] [facility] [employer] [facility] of Medicine  Risk manager

## 2022-04-07 ENCOUNTER — Encounter: Payer: Self-pay | Admitting: Cardiovascular Disease

## 2022-04-07 ENCOUNTER — Other Ambulatory Visit: Payer: Self-pay

## 2022-04-07 MED ORDER — APIXABAN 5 MG PO TABS: 5.0000 mg | ORAL_TABLET | Freq: Two times a day (BID) | ORAL | 1 refills | Status: DC

## 2022-04-07 MED ORDER — PROPRANOLOL HCL 10 MG PO TABS: 10.0000 mg | ORAL_TABLET | Freq: Four times a day (QID) | ORAL | 2 refills | Status: AC | PRN

## 2022-04-07 MED ORDER — AMLODIPINE BESYLATE 5 MG PO TABS: 5.0000 mg | ORAL_TABLET | Freq: Every day | ORAL | 2 refills | Status: DC

## 2022-04-07 MED ORDER — METOPROLOL SUCCINATE ER 25 MG PO TB24: 25.0000 mg | ORAL_TABLET | Freq: Two times a day (BID) | ORAL | 2 refills | Status: DC

## 2022-04-07 NOTE — Telephone Encounter (Signed)
Pt's medications were sent to pt's pharmacy as requested. Confirmation received.  

## 2022-04-11 ENCOUNTER — Telehealth: Payer: Self-pay | Admitting: Cardiovascular Disease

## 2022-04-11 NOTE — Telephone Encounter (Signed)
Sample request for Eliquis received. Indication: Afib  Last office visit: 11/20/21 (Nahser)  Scr: 0.77 (09/01/21)  Age: 67 Weight: 106.1kg  Eliquis 5mg  BID is appropriate dose.

## 2022-04-11 NOTE — Telephone Encounter (Signed)
Called pt to inform her that we were leaving 2 weeks of samples of Eliquis 5 mg tablets at the front desk at [facility] office for her to pick up. I advised the pt that if she has any other problems, questions or concerns, to give our office a call back. Pt verbalized understanding.   Lot#  [id number]  Exp:  06/2023

## 2022-04-11 NOTE — Telephone Encounter (Signed)
  Patient calling the office for samples of medication:   1.  What medication and dosage are you requesting samples for?apixaban (ELIQUIS) 5 MG TABS tablet   2.  Are you currently out of this medication? Only have 4 pills left   Pt said, [facility] since [facility] just receive [facility]ill they will process that medication and pt will receive it in 2-3 weeks. She needs samples since she only have 4 pills left.

## 2022-04-23 ENCOUNTER — Encounter: Payer: Self-pay | Admitting: Family Medicine

## 2022-04-23 ENCOUNTER — Ambulatory Visit (INDEPENDENT_AMBULATORY_CARE_PROVIDER_SITE_OTHER): Payer: Medicare Other | Admitting: Family Medicine

## 2022-04-23 VITALS — BP 135/82 | HR 50 | Temp 98.3°F | Resp 18 | Ht 65.0 in | Wt 233.4 lb

## 2022-04-23 DIAGNOSIS — R7302 Impaired glucose tolerance (oral): Secondary | ICD-10-CM | POA: Diagnosis not present

## 2022-04-23 DIAGNOSIS — Z0000 Encounter for general adult medical examination without abnormal findings: Secondary | ICD-10-CM | POA: Diagnosis not present

## 2022-04-23 DIAGNOSIS — Z1159 Encounter for screening for other viral diseases: Secondary | ICD-10-CM

## 2022-04-23 DIAGNOSIS — Z13820 Encounter for screening for osteoporosis: Secondary | ICD-10-CM

## 2022-04-23 DIAGNOSIS — R635 Abnormal weight gain: Secondary | ICD-10-CM

## 2022-04-23 DIAGNOSIS — Z13220 Encounter for screening for lipoid disorders: Secondary | ICD-10-CM | POA: Diagnosis not present

## 2022-04-23 DIAGNOSIS — Z136 Encounter for screening for cardiovascular disorders: Secondary | ICD-10-CM

## 2022-04-23 DIAGNOSIS — I10 Essential (primary) hypertension: Secondary | ICD-10-CM

## 2022-04-23 DIAGNOSIS — I480 Paroxysmal atrial fibrillation: Secondary | ICD-10-CM | POA: Diagnosis not present

## 2022-04-23 DIAGNOSIS — Z23 Encounter for immunization: Secondary | ICD-10-CM | POA: Diagnosis not present

## 2022-04-23 NOTE — Progress Notes (Signed)
Subjective:    [name] is a 67 y.o. female who presents for a Welcome to Medicare exam.   Pt still drives. Not using assistive device Right handed She can still get herself bathe and dressed.  She has had shingrix #1 but had pain. Unsure if she will be going back for the 2nd dose She would like PCV-20 today  Review of Systems Review of Systems  Constitutional:        Weight gain  All other systems reviewed and are negative.          Objective:    Today's Vitals   04/23/22 0925 04/23/22 0944  BP: (!) 148/82 135/82  Pulse: (!) 50   Resp: 18   Temp: 98.3 F (36.8 C)   TempSrc: Oral   SpO2: 98%   Weight: 233 lb 6.4 oz (105.9 kg)   Height: 5\' 5"  (1.651 m)   Body mass index is 38.84 kg/m.  Medications Outpatient Encounter Medications as of 04/23/2022  Medication Sig   albuterol (VENTOLIN HFA) 108 (90 Base) MCG/ACT inhaler Inhale 1-2 puffs into the lungs every 6 (six) hours as needed for wheezing or shortness of breath.   amLODipine (NORVASC) 5 MG tablet Take 1 tablet (5 mg total) by mouth daily.   apixaban (ELIQUIS) 5 MG TABS tablet Take 1 tablet (5 mg total) by mouth 2 (two) times daily.   Calcium Carb-Cholecalciferol (CALCIUM 600+D3 PO) Take 1 tablet by mouth in the morning.   metoprolol succinate (TOPROL XL) 25 MG 24 hr tablet Take 1 tablet (25 mg total) by mouth 2 (two) times daily.   Multiple Vitamin (MULTIVITAMIN WITH MINERALS) TABS tablet Take 1 tablet by mouth in the morning.   Probiotic Product (ULTRAFLORA IMMUNE HEALTH PO) Take 1 tablet by mouth daily.   propranolol (INDERAL) 10 MG tablet Take 1 tablet (10 mg total) by mouth 4 (four) times daily as needed.   TURMERIC PO Take 1 capsule by mouth in the morning.   No facility-administered encounter medications on file as of 04/23/2022.     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 unabale to pass ETT with Hyacinth Meeker 2; unable to visualize cord with MAC 3; intubating LMA used; uneventful intubation using Glidescope 2016, 2022   Diverticulosis, with history of diverticulitis of descending and sigmoid colon 03/03/2007   Dysrhythmia    atrial fib   Edema of both lower legs    Essential hypertension 06/25/2006   Qualifier: Diagnosis of  By: Briscoe Burns CMA, Alvy Beal     Fatty liver    Gastritis    GERD 03/03/2007   Qualifier: Diagnosis of  By: Koleen Distance CMA (AAMA), Hulan Saas   Qualifier: Diagnosis of  By: Myrtie Hawk, [name]  pt denies at preop of 02/15/20    Hepatic steatosis, found on CT 03/03/2007       Hiatal hernia, small 03/28/2016   EGD by [facility] 12/09/12.    Hip pain    History of colonic polyps 04/21/2008   Last  Colonoscopy was 12/09/12, Dr. [name]. No polyp at that time. Repeat in 5 years.    Internal hemorrhoids 03/03/2007   Last Colonoscopy was 12/09/12, Dr. [name]. Repeat in 5 years.    Joint pain    Lactose intolerance    Left adrenal mass 05/12/2013   Menopausal vasomotor syndrome 03/28/2016   Morbid obesity (HCC) (BMI 35 plus 2 comorbidities) 03/06/2016   Comorbid conditions include HTN, HLD, hepatic steatosis, OA, GERD, OSA.   OSA (obstructive sleep apnea) 03/03/2007   cpap    PAF (paroxysmal atrial fibrillation) 04/24/2015   Palpitations    PONV (postoperative nausea and vomiting)    area on left forearm DO NOT PLACE IV OR STICK ? vascular"will bleed"   Umbilical hernia without obstruction or gangrene, fat containing 03/28/2016   Found on CT, 2017. No issues.   Past Surgical History:  Procedure Laterality Date   ABDOMINAL  HYSTERECTOMY     APPENDECTOMY     BREAST REDUCTION SURGERY     BUNIONECTOMY     bilateral   CERVICAL FUSION     CHOLECYSTECTOMY N/A 10/01/2016   Procedure: LAPAROSCOPIC CHOLECYSTECTOMY;  Surgeon: Abigail Miyamoto, MD;  Location: [facility] OR;  Service: General;  Laterality: N/A;   COLONOSCOPY WITH PROPOFOL N/A 05/12/2019   Procedure: COLONOSCOPY WITH PROPOFOL;  Surgeon: Charna Elizabeth, MD;  Location: [facility] ENDOSCOPY;  Service: Endoscopy;  Laterality: N/A;   ESOPHAGOGASTRODUODENOSCOPY (EGD) WITH PROPOFOL N/A 09/20/2021   Procedure: ESOPHAGOGASTRODUODENOSCOPY (EGD) WITH PROPOFOL;  Surgeon: Jeani Hawking, MD;  Location: [facility] ENDOSCOPY;  Service: Gastroenterology;  Laterality: N/A;   MAXIMUM ACCESS (MAS)POSTERIOR LUMBAR INTERBODY FUSION (PLIF) 2 LEVEL N/A 09/27/2014   Procedure: Maximum Access Surgery Posterior Lumbar Interbody Fusion Lumbar three-four, Lumbar four-five;  Surgeon: Tia Alert, MD;  Location: [facility] NEURO ORS;  Service: Neurosurgery;  Laterality: N/A;   POLYPECTOMY  05/12/2019   Procedure: POLYPECTOMY;  Surgeon: Charna Elizabeth, MD;  Location: [facility] ENDOSCOPY;  Service: Endoscopy;;   SPINE SURGERY     TOTAL ABDOMINAL HYSTERECTOMY W/ BILATERAL SALPINGOOPHORECTOMY     TOTAL HIP ARTHROPLASTY Left 02/21/2020   Procedure: TOTAL HIP ARTHROPLASTY ANTERIOR APPROACH;  Surgeon: Durene Romans, MD;  Location: [facility] ORS;  Service: Orthopedics;  Laterality: Left;  70 mins   TOTAL HIP ARTHROPLASTY Right 05/14/2021   Procedure: TOTAL HIP ARTHROPLASTY ANTERIOR APPROACH;  Surgeon: Durene Romans, MD;  Location: [facility] ORS;  Service: Orthopedics;  Laterality: Right;    Family History  Problem Relation Age of Onset   Diabetes Mother    Hypertension Mother    Heart disease Mother    Sleep apnea Mother    Coronary artery disease Sister    Hypertension Sister    Colon cancer Neg Hx    Social History   Occupational History   Occupation: Stay at Home   Occupation: ACCOUNTING  Tobacco Use   Smoking status: Never   Smokeless  tobacco: Never  Vaping Use   Vaping Use: Never used  Substance and Sexual Activity   Alcohol use: No   Drug use: No   Sexual activity: Yes    Tobacco Counseling Counseling given: Not Answered   Immunizations and Health Maintenance Immunization History  Administered Date(s) Administered   Influenza Whole 12/04/2008, 11/14/2009   Influenza,inj,Quad PF,6+ Mos 09/29/2014   Tdap 09/04/2011   Zoster Recombinat (Shingrix) 03/01/2021   Health Maintenance Due  Topic Date Due   COVID-19 Vaccine (1) Never done   HIV Screening  Never done   Zoster Vaccines- Shingrix (2 of 2) 04/26/2021  DTaP/Tdap/Td (2 - Td or Tdap) 09/03/2021   DEXA SCAN  Never done   Pneumonia Vaccine 17+ Years old (1 of 1 - PCV) 04/20/2022    Activities of Daily Living    04/23/2022    9:32 AM 05/14/2021   12:36 PM  In your present state of health, do you have any difficulty performing the following activities:  Hearing? 0 0  Vision? 0 0  Difficulty concentrating or making decisions? 0 0  Walking or climbing stairs? 0 1  Dressing or bathing? 0 0  Doing errands, shopping? 0 0    Physical Exam  Physical Exam Vitals and nursing note reviewed.  Constitutional:      Appearance: Normal appearance. She is obese.  HENT:     Head: Normocephalic and atraumatic.     Right Ear: Tympanic membrane, ear canal and external ear normal.     Left Ear: Tympanic membrane, ear canal and external ear normal.     Mouth/Throat:     Mouth: Mucous membranes are moist.     Pharynx: Oropharynx is clear.  Eyes:     Conjunctiva/sclera: Conjunctivae normal.     Pupils: Pupils are equal, round, and reactive to light.  Cardiovascular:     Rate and Rhythm: Regular rhythm. Bradycardia present.     Pulses: Normal pulses.     Heart sounds: Normal heart sounds.  Pulmonary:     Effort: Pulmonary effort is normal.     Breath sounds: Normal breath sounds.  Abdominal:     General: Abdomen is flat.  Skin:    General: Skin is warm.      Capillary Refill: Capillary refill takes less than 2 seconds.  Neurological:     General: No focal deficit present.     Mental Status: She is alert and oriented to person, place, and time. Mental status is at baseline.  Psychiatric:        Mood and Affect: Mood normal.        Behavior: Behavior normal.        Thought Content: Thought content normal.        Judgment: Judgment normal.      Advanced Directives:      Assessment:    This is a routine wellness examination for this patient . Welcome to [facility]  Vision/Hearing screen Vision Screening   Right eye Left eye Both eyes  Without correction  With correction       Dietary issues and exercise activities discussed:      Goals   None    Depression Screen    03/24/2022    1:55 PM 01/25/2020    7:50 AM 09/18/2017   10:58 AM 03/27/2016   10:41 AM  PHQ 2/9 Scores  PHQ - 2 Score 2 6 0 0  PHQ- 9 Score 10 15       Fall Risk    03/24/2022    1:25 PM  Fall Risk   Falls in the past year? 0  Number falls in past yr: 0  Injury with Fall? 0  Risk for fall due to : No Fall Risks  Follow up Falls evaluation completed   Opioid Use/abuse: No opioid usage  Cognitive Function:        Patient Care Team: Suzan Slick, MD as PCP - General (Family Medicine) Nahser, Deloris Ping, MD as PCP - Cardiology (Cardiology)     Plan:   Welcome to Medicare preventive visit  Impaired glucose tolerance -  CBC with Differential/Platelet -     Comprehensive metabolic panel -     Hemoglobin A1c  Encounter for lipid screening for cardiovascular disease -     Lipid panel  PAF (paroxysmal atrial fibrillation)  Essential hypertension  Screening for viral disease -     HIV Antibody (routine testing w rflx)  Need for vaccination against Streptococcus pneumoniae -     Pneumococcal conjugate vaccine 20-valent  Weight gain -     TSH -     T4, free  Encounter for osteoporosis screening in  asymptomatic postmenopausal patient -     DG Bone Density; Future   Screening labs Refer for Dexa scan PCV-20 Advised on 2nd Shingrix and Tdap at pharmacy. See back in 6 months sooner prn.  I have personally reviewed and noted the following in the patient's chart:   Medical and social history Use of alcohol, tobacco or illicit drugs  Current medications and supplements Functional ability and status Nutritional status Physical activity Advanced directives List of other physicians Hospitalizations, surgeries, and ER visits in previous 12 months Vitals Screenings to include cognitive, depression, and falls Referrals and appointments  In addition, I have reviewed and discussed with patient certain preventive protocols, quality metrics, and best practice recommendations. A written personalized care plan for preventive services as well as general preventive health recommendations were provided to patient.     Suzan Slick, MD 04/23/2022

## 2022-04-24 ENCOUNTER — Encounter: Payer: Self-pay | Admitting: Family Medicine

## 2022-04-24 LAB — CBC WITH DIFFERENTIAL/PLATELET
Basophils Absolute: 0 10*3/uL (ref 0.0–0.2)
Basos: 1 %
EOS (ABSOLUTE): 0.1 10*3/uL (ref 0.0–0.4)
Eos: 3 %
Hematocrit: 43.7 % (ref 34.0–46.6)
Hemoglobin: 14.9 g/dL (ref 11.1–15.9)
Immature Grans (Abs): 0 10*3/uL (ref 0.0–0.1)
Immature Granulocytes: 0 %
Lymphocytes Absolute: 2.1 10*3/uL (ref 0.7–3.1)
Lymphs: 49 %
MCH: 32.8 pg (ref 26.6–33.0)
MCHC: 34.1 g/dL (ref 31.5–35.7)
MCV: 96 fL (ref 79–97)
Monocytes Absolute: 0.4 10*3/uL (ref 0.1–0.9)
Monocytes: 10 %
Neutrophils Absolute: 1.6 10*3/uL (ref 1.4–7.0)
Neutrophils: 37 %
Platelets: 324 10*3/uL (ref 150–450)
RBC: 4.54 x10E6/uL (ref 3.77–5.28)
RDW: 12.9 % (ref 11.7–15.4)
WBC: 4.2 10*3/uL (ref 3.4–10.8)

## 2022-04-24 LAB — COMPREHENSIVE METABOLIC PANEL
ALT: 19 IU/L (ref 0–32)
AST: 18 IU/L (ref 0–40)
Albumin/Globulin Ratio: 1.5 (ref 1.2–2.2)
Albumin: 4.1 g/dL (ref 3.9–4.9)
Alkaline Phosphatase: 98 IU/L (ref 44–121)
BUN/Creatinine Ratio: 14 (ref 12–28)
BUN: 11 mg/dL (ref 8–27)
Bilirubin Total: 0.4 mg/dL (ref 0.0–1.2)
CO2: 22 mmol/L (ref 20–29)
Calcium: 9.9 mg/dL (ref 8.7–10.3)
Chloride: 104 mmol/L (ref 96–106)
Creatinine, Ser: 0.78 mg/dL (ref 0.57–1.00)
Globulin, Total: 2.8 g/dL (ref 1.5–4.5)
Glucose: 94 mg/dL (ref 70–99)
Potassium: 4.4 mmol/L (ref 3.5–5.2)
Sodium: 140 mmol/L (ref 134–144)
Total Protein: 6.9 g/dL (ref 6.0–8.5)
eGFR: 84 mL/min/{1.73_m2} (ref >59)

## 2022-04-24 LAB — LIPID PANEL
Chol/HDL Ratio: 2.9 ratio (ref 0.0–4.4)
Cholesterol, Total: 147 mg/dL (ref 100–199)
HDL: 50 mg/dL (ref >39)
LDL Chol Calc (NIH): 79 mg/dL (ref 0–99)
Triglycerides: 96 mg/dL (ref 0–149)
VLDL Cholesterol Cal: 18 mg/dL (ref 5–40)

## 2022-04-24 LAB — T4, FREE: Free T4: 1.26 ng/dL (ref 0.82–1.77)

## 2022-04-24 LAB — TSH: TSH: 1.27 u[IU]/mL (ref 0.450–4.500)

## 2022-04-24 LAB — HEMOGLOBIN A1C
Est. average glucose Bld gHb Est-mCnc: 117 mg/dL
Hgb A1c MFr Bld: 5.7 % — ABNORMAL HIGH (ref 4.8–5.6)

## 2022-04-24 LAB — HIV ANTIBODY (ROUTINE TESTING W REFLEX): HIV Screen 4th Generation wRfx: NONREACTIVE

## 2022-04-28 DIAGNOSIS — G4700 Insomnia, unspecified: Secondary | ICD-10-CM | POA: Diagnosis not present

## 2022-04-28 DIAGNOSIS — N644 Mastodynia: Secondary | ICD-10-CM | POA: Diagnosis not present

## 2022-04-29 ENCOUNTER — Other Ambulatory Visit: Payer: Self-pay | Admitting: Obstetrics & Gynecology

## 2022-04-29 DIAGNOSIS — N644 Mastodynia: Secondary | ICD-10-CM

## 2022-05-01 ENCOUNTER — Ambulatory Visit: Admitting: Family Medicine

## 2022-05-02 ENCOUNTER — Other Ambulatory Visit: Payer: Self-pay | Admitting: Family Medicine

## 2022-05-02 DIAGNOSIS — Z6838 Body mass index (BMI) 38.0-38.9, adult: Secondary | ICD-10-CM

## 2022-05-12 ENCOUNTER — Ambulatory Visit (INDEPENDENT_AMBULATORY_CARE_PROVIDER_SITE_OTHER): Admitting: Sports Medicine

## 2022-05-12 DIAGNOSIS — M7022 Olecranon bursitis, left elbow: Secondary | ICD-10-CM | POA: Diagnosis not present

## 2022-05-12 NOTE — Progress Notes (Signed)
    Procedures performed today:    None.  Independent interpretation of notes and tests performed by another provider:   None.  Brief History, Exam, Impression, and Recommendations:    Olecranon bursitis of left elbow Pleasant 67 year old female, she had a fall back in December and developed persistent pain and swelling over the olecranon left side, at the last visit we diagnosed her with an olecranon bursitis, we aspirated and injected this, it has now resolved with only a small residual fullness within the olecranon bursa that can be left alone. She can return to see me on an as-needed basis.    ____________________________________________ Ihor Austin. Benjamin Stain, M.D., ABFM., CAQSM., AME. Primary Care and Sports Medicine [facility]  Adjunct Professor of Family Medicine  [employer] [facility] Medicine  Restaurant manager, fast food

## 2022-05-12 NOTE — Assessment & Plan Note (Signed)
Pleasant 67 year old female, she had a fall back in December and developed persistent pain and swelling over the olecranon left side, at the last visit we diagnosed her with an olecranon bursitis, we aspirated and injected this, it has now resolved with only a small residual fullness within the olecranon bursa that can be left alone. She can return to see me on an as-needed basis.

## 2022-05-13 ENCOUNTER — Ambulatory Visit: Admitting: Sports Medicine

## 2022-05-19 ENCOUNTER — Ambulatory Visit
Admission: RE | Admit: 2022-05-19 | Discharge: 2022-05-19 | Disposition: A | Discharge status: Home / Self Care | Source: Ambulatory Visit | Attending: Obstetrics & Gynecology | Admitting: Obstetrics & Gynecology

## 2022-05-19 ENCOUNTER — Ambulatory Visit
Admission: RE | Admit: 2022-05-19 | Discharge: 2022-05-19 | Disposition: A | Discharge status: Home / Self Care | Payer: Medicare Other | Source: Ambulatory Visit | Attending: Obstetrics & Gynecology | Admitting: Obstetrics & Gynecology

## 2022-05-19 DIAGNOSIS — N644 Mastodynia: Secondary | ICD-10-CM

## 2022-05-28 ENCOUNTER — Encounter: Payer: Self-pay | Admitting: Nurse Practitioner

## 2022-05-28 ENCOUNTER — Ambulatory Visit (INDEPENDENT_AMBULATORY_CARE_PROVIDER_SITE_OTHER): Payer: Medicare Other | Admitting: Nurse Practitioner

## 2022-05-28 VITALS — BP 145/80 | HR 63 | Temp 97.7°F | Ht 65.0 in | Wt 229.0 lb

## 2022-05-28 DIAGNOSIS — R7303 Prediabetes: Secondary | ICD-10-CM

## 2022-05-28 DIAGNOSIS — I480 Paroxysmal atrial fibrillation: Secondary | ICD-10-CM

## 2022-05-28 DIAGNOSIS — Z0289 Encounter for other administrative examinations: Secondary | ICD-10-CM

## 2022-05-28 DIAGNOSIS — I10 Essential (primary) hypertension: Secondary | ICD-10-CM

## 2022-05-28 DIAGNOSIS — G4733 Obstructive sleep apnea (adult) (pediatric): Secondary | ICD-10-CM

## 2022-05-28 DIAGNOSIS — Z6838 Body mass index (BMI) 38.0-38.9, adult: Secondary | ICD-10-CM

## 2022-05-28 NOTE — Progress Notes (Signed)
Office: [phone]  /  Fax: [phone]  WEIGHT SUMMARY AND BIOMETRICS  No data recorded No data recorded  Vitals Temp: 97.7 F (36.5 C) BP: (!) 145/80 Pulse Rate: 63 SpO2: 99 %   Anthropometric Measurements Height: 5\' 5"  (1.651 m) Weight: 229 lb (103.9 kg) BMI (Calculated): 38.11   Body Composition  Body Fat %: 47 % Fat Mass (lbs): 107.6 lbs Muscle Mass (lbs): 115.2 lbs Total Body Water (lbs): 86.8 lbs Visceral Fat Rating : 15   No data recorded   HPI  Chief Complaint: OBESITY  [name] is here to discuss her progress with her obesity treatment plan. She is eating a low sodium, low fat diet.  She is not eating red meat or pork. She enjoys eating vegetables, chicken, fish and [place].   She states she is walking and riding her exercise bike 4 days per week.    Interval History:  [name] reports she was "small all my life until menopause".  She has co morbidities of CHF, HTN, PAF, allergies, asthma, OSAS not on CPAP, GERD, fatty liver, OA (B hip replacement 2022 & 2023), depression, back pain, pre diabetes, umbilical hernia and hiatal hernia.  She was seen here last on 01/22/21.  Since her last visit, her husband had an MI.  He is doing better after stent placement.  Her sister had to stay with her for awhile due to worsening of dementia.  Her sister is not living in a nursing home.  [name] has been under a lot of stress due to life events but now feels she can focus on herself and work on weight loss.    Pharmacotherapy for weight loss: She is not currently taking medications  for medical weight loss.    Previous pharmacotherapy for medical weight loss:  She took Ozempic and Metformin in the past for pre diabetes.  She had side effects of constipation on Ozempic and diarrhea on Metformin.    Bariatric surgery:  Patient has not had bariatric surgery  Hypertension/a fib Hypertension BP looks better today. She saw cardiology last on 11/20/21.   Medication(s): Norvasc  5mg , Toprol XL 25 mg, propranolol 10mg  Denies chest pain, palpitations and SOB.  BP Readings from Last 3 Encounters:  05/28/22 (!) 145/80  04/23/22 135/82  03/24/22 (!) 152/78   Lab Results  Component Value Date   CREATININE 0.78 04/23/2022   CREATININE 0.91 05/15/2021   CREATININE 0.70 05/05/2021    Obstructive Sleep Apnea Diavian has a diagnosis of sleep apnea. She reports that she is not using a CPAP regularly.  Prediabetes Last A1c was 5.7  Medication(s): not currently on meds Polyphagia:Yes Lab Results  Component Value Date   HGBA1C 5.7 (H) 04/23/2022   HGBA1C 5.5 05/02/2021   HGBA1C 5.6 07/29/2016   HGBA1C 5.8 04/22/2016   HGBA1C 5.8 (H) 01/28/2012   Lab Results  Component Value Date   INSULIN 9.2 01/25/2020        PHYSICAL EXAM:  Blood pressure (!) 145/80, pulse 63, temperature 97.7 F (36.5 C), height 5\' 5"  (1.651 m), weight 229 lb (103.9 kg), SpO2 99 %. Body mass index is 38.11 kg/m.  General: She is overweight, cooperative, alert, well developed, and in no acute distress. PSYCH: Has normal mood, affect and thought process.   Extremities: No edema.  Neurologic: No gross sensory or motor deficits. No tremors or fasciculations noted.    DIAGNOSTIC DATA REVIEWED:  BMET    Component Value Date/Time   NA 140 04/23/2022 1039  K 4.4 04/23/2022 1039   CL 104 04/23/2022 1039   CO2 22 04/23/2022 1039   GLUCOSE 94 04/23/2022 1039   GLUCOSE 154 (H) 05/15/2021 0339   BUN 11 04/23/2022 1039   CREATININE 0.78 04/23/2022 1039   CALCIUM 9.9 04/23/2022 1039   GFRNONAA >60 05/15/2021 0339   GFRAA 69 10/01/2018 0855   Lab Results  Component Value Date   HGBA1C 5.7 (H) 04/23/2022   HGBA1C  07/18/2008    5.7 (NOTE) The ADA recommends the following therapeutic goal for glycemic control related to Hgb A1c measurement: Goal of therapy: <6.5 Hgb A1c  Reference: American Diabetes Association: Clinical Practice Recommendations 2010, Diabetes Care, 2010, 33: (Suppl   1).   Lab Results  Component Value Date   INSULIN 9.2 01/25/2020   Lab Results  Component Value Date   TSH 1.270 04/23/2022   CBC    Component Value Date/Time   WBC 4.2 04/23/2022 1039   WBC 20.1 (H) 05/16/2021 0329   RBC 4.54 04/23/2022 1039   RBC 3.60 (L) 05/16/2021 0329   HGB 14.9 04/23/2022 1039   HCT 43.7 04/23/2022 1039   PLT 324 04/23/2022 1039   MCV 96 04/23/2022 1039   MCH 32.8 04/23/2022 1039   MCH 32.8 05/16/2021 0329   MCHC 34.1 04/23/2022 1039   MCHC 33.7 05/16/2021 0329   RDW 12.9 04/23/2022 1039   Iron Studies    Component Value Date/Time   IRON 76 12/13/2007 1043   IRONPCTSAT 26.6 12/13/2007 1043   Lipid Panel     Component Value Date/Time   CHOL 147 04/23/2022 1039   TRIG 96 04/23/2022 1039   HDL 50 04/23/2022 1039   CHOLHDL 2.9 04/23/2022 1039   CHOLHDL 3 04/22/2016 1234   VLDL 20.6 04/22/2016 1234   LDLCALC 79 04/23/2022 1039   Hepatic Function Panel     Component Value Date/Time   PROT 6.9 04/23/2022 1039   ALBUMIN 4.1 04/23/2022 1039   AST 18 04/23/2022 1039   ALT 19 04/23/2022 1039   ALKPHOS 98 04/23/2022 1039   BILITOT 0.4 04/23/2022 1039   BILIDIR 0.16 10/01/2018 0857   IBILI NOT CALCULATED 09/19/2014 1515      Component Value Date/Time   TSH 1.270 04/23/2022 1010   Nutritional Lab Results  Component Value Date   VD25OH 70.2 01/25/2020   VD25OH 62 02/07/2013     ASSESSMENT AND PLAN  TREATMENT PLAN FOR OBESITY:  Recommended Dietary Goals  [name] is currently in the action stage of change. As such, her goal is to continue weight management plan. She has agreed to return for IC and labs needed.  Behavioral Intervention  We discussed the following Behavioral Modification Strategies today: increasing lean protein intake, decreasing simple carbohydrates , increasing vegetables, increasing lower glycemic fruits, increasing fiber rich foods, avoiding skipping meals, increasing water intake, continue to practice mindfulness  when eating, and planning for success.  Additional resources provided today: NA  Recommended Physical Activity Goals  [name] has been advised to work up to 150 minutes of moderate intensity aerobic activity a week and strengthening exercises 2-3 times per week for cardiovascular health, weight loss maintenance and preservation of muscle mass.   She has agreed to Continue current level of physical activity    Pharmacotherapy We discussed various medication options to help Stella with her weight loss efforts and we both agreed to consider her options.  Avoid Phentermine and Qsymia due to cardiac history.    ASSOCIATED CONDITIONS ADDRESSED TODAY  Action/Plan  Essential hypertension Continue to follow up with cardiology and PCP.  Continue meds as directed  PAF (paroxysmal atrial fibrillation) (HCC) Continue to follow up with cardiology and PCP.  Continue meds as directed  OSA (obstructive sleep apnea) Discussed the importance of using CPAP on a nightly basis and the risk associated with untreated sleep apnea such as but not limited to worsening hypertension, a fib, MI and CVA.  I have encouraged the patient to discuss extensively with cardiology and PCP and I encouraged her to use CPAP on a nightly basis.  Pre-diabetes Brionne will continue to work on weight loss, exercise, and decreasing simple carbohydrates to help decrease the risk of diabetes.    Morbid obesity (HCC)  BMI 38.0-38.9,adult       Return in about 2 weeks (around 06/11/2022) for fasting labs and IC.[name] She was informed of the importance of frequent follow up visits to maximize her success with intensive lifestyle modifications for her multiple health conditions.   ATTESTASTION STATEMENTS:  Reviewed by clinician on day of visit: allergies, medications, problem list, medical history, surgical history, family history, social history, and previous encounter notes.   Time spent on visit including pre-visit chart review  and post-visit care and charting was 30 minutes.    Theodis Sato. Analyce Tavares FNP-C

## 2022-05-28 NOTE — Progress Notes (Deleted)
Office: [phone]  /  Fax: [phone]   Initial Visit  [name] was seen in clinic today to evaluate for obesity. She is interested in losing weight to improve overall health and reduce the risk of weight related complications. She presents today to review program treatment options, initial physical assessment, and evaluation.     She was referred by: {emreferby:28303}  When asked what else they would like to accomplish? She states: {EMHopetoaccomplish:28304}  Weight history:  When asked how has your weight affected you? She states: {EMWeightAffected:28305}  Some associated conditions: {EMSomeConditions:28306}  Contributing factors: {EMcontributingfactors:28307}  Weight promoting medications identified: {EMWeightpromotingrx:28308}  Current nutrition plan: {EMNutritionplan:28309::"None"}  Current level of physical activity: {EMcurrentPA:28310::"None"}  Current or previous pharmacotherapy: {EM previousRx:28311}  Response to medication: {EMResponsetomedication:28312}   Past medical history includes:   Past Medical History:  Diagnosis Date   Allergic rhinitis 04/03/2009   Anemia    hx   Arthritis    Asthma, mild, intermittent 01/23/2009   Qualifier: Diagnosis of  By: Janit Bern - denies   Back pain 08/03/2012   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 unabale to pass ETT with Hyacinth Meeker 2; unable to visualize cord with MAC 3; intubating LMA used; uneventful intubation using Glidescope 2016, 2022   Diverticulosis, with history of diverticulitis of descending and sigmoid colon 03/03/2007   Dysrhythmia    atrial fib   Edema of both lower legs    Essential hypertension 06/25/2006   Qualifier: Diagnosis of  By: Briscoe Burns  CMA, Alvy Beal     Fatty liver    Gastritis    GERD 03/03/2007   Qualifier: Diagnosis of  By: Koleen Distance CMA (AAMA), Hulan Saas   Qualifier: Diagnosis of  By: Myrtie Hawk, [name]  pt denies at preop of 02/15/20    Hepatic steatosis, found on CT 03/03/2007       Hiatal hernia, small 03/28/2016   EGD by [facility] 12/09/12.    Hip pain    History of colonic polyps 04/21/2008   Last Colonoscopy was 12/09/12, Dr. [name]. No polyp at that time. Repeat in 5 years.    Internal hemorrhoids 03/03/2007   Last Colonoscopy was 12/09/12, Dr. [name]. Repeat in 5 years.    Joint pain    Lactose intolerance    Left adrenal mass (HCC) 05/12/2013   Menopausal vasomotor syndrome 03/28/2016   Morbid obesity (HCC) (BMI 35 plus 2 comorbidities) 03/06/2016   Comorbid conditions include HTN, HLD, hepatic steatosis, OA, GERD, OSA.   OSA (obstructive sleep apnea) 03/03/2007   cpap    PAF (paroxysmal atrial fibrillation) (HCC) 04/24/2015   Palpitations    PONV (postoperative nausea and vomiting)    area on left forearm DO NOT PLACE IV OR STICK ? vascular"will bleed"   Umbilical hernia without obstruction or gangrene, fat containing 03/28/2016   Found on CT, 2017. No issues.     Objective:   BP (!) 145/80   Pulse 63   Temp 97.7 F (36.5 C)   Ht 5\' 5"  (1.651 m)   Wt 229 lb (103.9 kg)   SpO2 99%   BMI 38.11 kg/m  She was weighed on the bioimpedance scale: Body mass index is 38.11 kg/m.  Peak Weight:*** , Body Fat%:***, Visceral Fat Rating:***, Weight trend over the last  12 months: {emweighttrend:28333}  General:  Alert, oriented and cooperative. Patient is in no acute distress.  Respiratory: Normal respiratory effort, no problems with respiration noted   Gait: able to ambulate independently  Mental Status: Normal mood and affect. Normal behavior. Normal judgment and thought content.   DIAGNOSTIC DATA REVIEWED:  BMET    Component Value Date/Time   NA 140 04/23/2022 1039   K 4.4 04/23/2022 1039   CL 104  04/23/2022 1039   CO2 22 04/23/2022 1039   GLUCOSE 94 04/23/2022 1039   GLUCOSE 154 (H) 05/15/2021 0339   BUN 11 04/23/2022 1039   CREATININE 0.78 04/23/2022 1039   CALCIUM 9.9 04/23/2022 1039   GFRNONAA >60 05/15/2021 0339   GFRAA 69 10/01/2018 0855   Lab Results  Component Value Date   HGBA1C 5.7 (H) 04/23/2022   HGBA1C  07/18/2008    5.7 (NOTE) The ADA recommends the following therapeutic goal for glycemic control related to Hgb A1c measurement: Goal of therapy: <6.5 Hgb A1c  Reference: American Diabetes Association: Clinical Practice Recommendations 2010, Diabetes Care, 2010, 33: (Suppl  1).   Lab Results  Component Value Date   INSULIN 9.2 01/25/2020   CBC    Component Value Date/Time   WBC 4.2 04/23/2022 1039   WBC 20.1 (H) 05/16/2021 0329   RBC 4.54 04/23/2022 1039   RBC 3.60 (L) 05/16/2021 0329   HGB 14.9 04/23/2022 1039   HCT 43.7 04/23/2022 1039   PLT 324 04/23/2022 1039   MCV 96 04/23/2022 1039   MCH 32.8 04/23/2022 1039   MCH 32.8 05/16/2021 0329   MCHC 34.1 04/23/2022 1039   MCHC 33.7 05/16/2021 0329   RDW 12.9 04/23/2022 1039   Iron/TIBC/Ferritin/ %Sat    Component Value Date/Time   IRON 76 12/13/2007 1043   IRONPCTSAT 26.6 12/13/2007 1043   Lipid Panel     Component Value Date/Time   CHOL 147 04/23/2022 1039   TRIG 96 04/23/2022 1039   HDL 50 04/23/2022 1039   CHOLHDL 2.9 04/23/2022 1039   CHOLHDL 3 04/22/2016 1234   VLDL 20.6 04/22/2016 1234   LDLCALC 79 04/23/2022 1039   Hepatic Function Panel     Component Value Date/Time   PROT 6.9 04/23/2022 1039   ALBUMIN 4.1 04/23/2022 1039   AST 18 04/23/2022 1039   ALT 19 04/23/2022 1039   ALKPHOS 98 04/23/2022 1039   BILITOT 0.4 04/23/2022 1039   BILIDIR 0.16 10/01/2018 0857   IBILI NOT CALCULATED 09/19/2014 1515      Component Value Date/Time   TSH 1.270 04/23/2022 1010     Assessment and Plan:   There are no diagnoses linked to this encounter.      Obesity Treatment / Action  Plan:  {EMobesityactionplanscribe:28314::"Patient will work on garnering support from family and friends to begin weight loss journey.","Will work on eliminating or reducing the presence of highly palatable, calorie dense foods in the home.","Will complete provided nutritional and psychosocial assessment questionnaire before the next appointment.","Will be scheduled for indirect calorimetry to determine resting energy expenditure in a fasting state.  This will allow [place] to create a reduced calorie, high-protein meal plan to promote loss of fat mass while preserving muscle mass.","Counseled on the health benefits of losing 5%-15% of total body weight.","Was counseled on nutritional approaches to weight loss and benefits of reducing processed foods and consuming plant-based foods and high quality protein as part of nutritional weight management.","Was counseled on pharmacotherapy and role as an adjunct in weight management. "}  Obesity Education Performed Today:  She was weighed on the bioimpedance scale and results were discussed and documented in the synopsis.  We discussed obesity as a disease and the importance of a more detailed evaluation of all the factors contributing to the disease.  We discussed the importance of long term lifestyle changes which include nutrition, exercise and behavioral modifications as well as the importance of customizing this to her specific health and social needs.  We discussed the benefits of reaching a healthier weight to alleviate the symptoms of existing conditions and reduce the risks of the biomechanical, metabolic and psychological effects of obesity.  [name] appears to be in the action stage of change and states they are ready to start intensive lifestyle modifications and behavioral modifications.  *** minutes was spent today on this visit including the above counseling, pre-visit chart review, and post-visit documentation.  Reviewed by clinician on  day of visit: allergies, medications, problem list, medical history, surgical history, family history, social history, and previous encounter notes pertinent to obesity diagnosis.    Theodis Sato Emmelina Mcloughlin FNP-C

## 2022-05-28 NOTE — Progress Notes (Deleted)
Office: [phone]  /  Fax: [phone]   Initial Visit  [name] was seen in clinic today to evaluate for obesity. She is interested in losing weight to improve overall health and reduce the risk of weight related complications. She presents today to review program treatment options, initial physical assessment, and evaluation.     She was referred by: {emreferby:28303}  When asked what else they would like to accomplish? She states: {EMHopetoaccomplish:28304}  Weight history:  When asked how has your weight affected you? She states: {EMWeightAffected:28305}  Some associated conditions: {EMSomeConditions:28306}  Contributing factors: {EMcontributingfactors:28307}  Weight promoting medications identified: {EMWeightpromotingrx:28308}  Current nutrition plan: {EMNutritionplan:28309::"None"}  Current level of physical activity: {EMcurrentPA:28310::"None"}  Current or previous pharmacotherapy: {EM previousRx:28311}  Response to medication: {EMResponsetomedication:28312}   Past medical history includes:   Past Medical History:  Diagnosis Date   Allergic rhinitis 04/03/2009   Anemia    hx   Arthritis    Asthma, mild, intermittent 01/23/2009   Qualifier: Diagnosis of  By: Lowne DO, Yvonne - denies   Back pain 08/03/2012   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 unabale to pass ETT with Miller 2; unable to visualize cord with MAC 3; intubating LMA used; uneventful intubation using Glidescope 2016, 2022   Diverticulosis, with history of diverticulitis of descending and sigmoid colon 03/03/2007   Dysrhythmia    atrial fib   Edema of both lower legs    Essential hypertension 06/25/2006   Qualifier: Diagnosis of  By: Archie  CMA, Lakisha     Fatty liver    Gastritis    GERD 03/03/2007   Qualifier: Diagnosis of  By: [name] (AAMA), Leisha   Qualifier: Diagnosis of  By: Esterwood PA-c, [name]  pt denies at preop of 02/15/20    Hepatic steatosis, found on CT 03/03/2007       Hiatal hernia, small 03/28/2016   EGD by [facility] 12/09/12.    Hip pain    History of colonic polyps 04/21/2008   Last Colonoscopy was 12/09/12, Dr. Stark. No polyp at that time. Repeat in 5 years.    Internal hemorrhoids 03/03/2007   Last Colonoscopy was 12/09/12, Dr. Stark. Repeat in 5 years.    Joint pain    Lactose intolerance    Left adrenal mass (HCC) 05/12/2013   Menopausal vasomotor syndrome 03/28/2016   Morbid obesity (HCC) (BMI 35 plus 2 comorbidities) 03/06/2016   Comorbid conditions include HTN, HLD, hepatic steatosis, OA, GERD, OSA.   OSA (obstructive sleep apnea) 03/03/2007   cpap    PAF (paroxysmal atrial fibrillation) (HCC) 04/24/2015   Palpitations    PONV (postoperative nausea and vomiting)    area on left forearm DO NOT PLACE IV OR STICK ? vascular"will bleed"   Umbilical hernia without obstruction or gangrene, fat containing 03/28/2016   Found on CT, 2017. No issues.     Objective:   BP (!) 145/80   Pulse 63   Temp 97.7 F (36.5 C)   Ht 5' 5" (1.651 m)   Wt 229 lb (103.9 kg)   SpO2 99%   BMI 38.11 kg/m  She was weighed on the bioimpedance scale: Body mass index is 38.11 kg/m.  Peak Weight:*** , Body Fat%:***, Visceral Fat Rating:***, Weight trend over the last   12 months: {emweighttrend:28333}  General:  Alert, oriented and cooperative. Patient is in no acute distress.  Respiratory: Normal respiratory effort, no problems with respiration noted   Gait: able to ambulate independently  Mental Status: Normal mood and affect. Normal behavior. Normal judgment and thought content.   DIAGNOSTIC DATA REVIEWED:  BMET    Component Value Date/Time   NA 140 04/23/2022 1039   K 4.4 04/23/2022 1039   CL 104  04/23/2022 1039   CO2 22 04/23/2022 1039   GLUCOSE 94 04/23/2022 1039   GLUCOSE 154 (H) 05/15/2021 0339   BUN 11 04/23/2022 1039   CREATININE 0.78 04/23/2022 1039   CALCIUM 9.9 04/23/2022 1039   GFRNONAA >60 05/15/2021 0339   GFRAA 69 10/01/2018 0855   Lab Results  Component Value Date   HGBA1C 5.7 (H) 04/23/2022   HGBA1C  07/18/2008    5.7 (NOTE) The ADA recommends the following therapeutic goal for glycemic control related to Hgb A1c measurement: Goal of therapy: <6.5 Hgb A1c  Reference: American Diabetes Association: Clinical Practice Recommendations 2010, Diabetes Care, 2010, 33: (Suppl  1).   Lab Results  Component Value Date   INSULIN 9.2 01/25/2020   CBC    Component Value Date/Time   WBC 4.2 04/23/2022 1039   WBC 20.1 (H) 05/16/2021 0329   RBC 4.54 04/23/2022 1039   RBC 3.60 (L) 05/16/2021 0329   HGB 14.9 04/23/2022 1039   HCT 43.7 04/23/2022 1039   PLT 324 04/23/2022 1039   MCV 96 04/23/2022 1039   MCH 32.8 04/23/2022 1039   MCH 32.8 05/16/2021 0329   MCHC 34.1 04/23/2022 1039   MCHC 33.7 05/16/2021 0329   RDW 12.9 04/23/2022 1039   Iron/TIBC/Ferritin/ %Sat    Component Value Date/Time   IRON 76 12/13/2007 1043   IRONPCTSAT 26.6 12/13/2007 1043   Lipid Panel     Component Value Date/Time   CHOL 147 04/23/2022 1039   TRIG 96 04/23/2022 1039   HDL 50 04/23/2022 1039   CHOLHDL 2.9 04/23/2022 1039   CHOLHDL 3 04/22/2016 1234   VLDL 20.6 04/22/2016 1234   LDLCALC 79 04/23/2022 1039   Hepatic Function Panel     Component Value Date/Time   PROT 6.9 04/23/2022 1039   ALBUMIN 4.1 04/23/2022 1039   AST 18 04/23/2022 1039   ALT 19 04/23/2022 1039   ALKPHOS 98 04/23/2022 1039   BILITOT 0.4 04/23/2022 1039   BILIDIR 0.16 10/01/2018 0857   IBILI NOT CALCULATED 09/19/2014 1515      Component Value Date/Time   TSH 1.270 04/23/2022 1010     Assessment and Plan:   There are no diagnoses linked to this encounter.      Obesity Treatment / Action  Plan:  {EMobesityactionplanscribe:28314::"Patient will work on garnering support from family and friends to begin weight loss journey.","Will work on eliminating or reducing the presence of highly palatable, calorie dense foods in the home.","Will complete provided nutritional and psychosocial assessment questionnaire before the next appointment.","Will be scheduled for indirect calorimetry to determine resting energy expenditure in a fasting state.  This will allow us to create a reduced calorie, high-protein meal plan to promote loss of fat mass while preserving muscle mass.","Counseled on the health benefits of losing 5%-15% of total body weight.","Was counseled on nutritional approaches to weight loss and benefits of reducing processed foods and consuming plant-based foods and high quality protein as part of nutritional weight management.","Was counseled on pharmacotherapy and role as an adjunct in weight management. "}    Obesity Education Performed Today:  She was weighed on the bioimpedance scale and results were discussed and documented in the synopsis.  We discussed obesity as a disease and the importance of a more detailed evaluation of all the factors contributing to the disease.  We discussed the importance of long term lifestyle changes which include nutrition, exercise and behavioral modifications as well as the importance of customizing this to her specific health and social needs.  We discussed the benefits of reaching a healthier weight to alleviate the symptoms of existing conditions and reduce the risks of the biomechanical, metabolic and psychological effects of obesity.  [name] appears to be in the action stage of change and states they are ready to start intensive lifestyle modifications and behavioral modifications.  *** minutes was spent today on this visit including the above counseling, pre-visit chart review, and post-visit documentation.  Reviewed by clinician on  day of visit: allergies, medications, problem list, medical history, surgical history, family history, social history, and previous encounter notes pertinent to obesity diagnosis.    Stephanie R Tickerhoff FNP-C   

## 2022-05-28 NOTE — Patient Instructions (Signed)

## 2022-06-23 ENCOUNTER — Encounter: Payer: Self-pay | Admitting: Bariatrics

## 2022-06-23 ENCOUNTER — Ambulatory Visit (INDEPENDENT_AMBULATORY_CARE_PROVIDER_SITE_OTHER): Payer: Medicare Other | Admitting: Bariatrics

## 2022-06-23 VITALS — BP 147/79 | HR 58 | Temp 97.9°F | Ht 65.0 in | Wt 236.0 lb

## 2022-06-23 DIAGNOSIS — G4733 Obstructive sleep apnea (adult) (pediatric): Secondary | ICD-10-CM

## 2022-06-23 DIAGNOSIS — E559 Vitamin D deficiency, unspecified: Secondary | ICD-10-CM | POA: Diagnosis not present

## 2022-06-23 DIAGNOSIS — E538 Deficiency of other specified B group vitamins: Secondary | ICD-10-CM

## 2022-06-23 DIAGNOSIS — I10 Essential (primary) hypertension: Secondary | ICD-10-CM

## 2022-06-23 DIAGNOSIS — R7303 Prediabetes: Secondary | ICD-10-CM | POA: Diagnosis not present

## 2022-06-23 DIAGNOSIS — R5383 Other fatigue: Secondary | ICD-10-CM

## 2022-06-23 DIAGNOSIS — K760 Fatty (change of) liver, not elsewhere classified: Secondary | ICD-10-CM | POA: Diagnosis not present

## 2022-06-23 DIAGNOSIS — Z1331 Encounter for screening for depression: Secondary | ICD-10-CM

## 2022-06-23 DIAGNOSIS — R0602 Shortness of breath: Secondary | ICD-10-CM | POA: Insufficient documentation

## 2022-06-23 DIAGNOSIS — E669 Obesity, unspecified: Secondary | ICD-10-CM

## 2022-06-23 DIAGNOSIS — Z6839 Body mass index (BMI) 39.0-39.9, adult: Secondary | ICD-10-CM

## 2022-06-24 ENCOUNTER — Telehealth (INDEPENDENT_AMBULATORY_CARE_PROVIDER_SITE_OTHER): Payer: Self-pay | Admitting: Bariatrics

## 2022-06-24 ENCOUNTER — Encounter (INDEPENDENT_AMBULATORY_CARE_PROVIDER_SITE_OTHER): Payer: Self-pay | Admitting: Bariatrics

## 2022-06-24 DIAGNOSIS — E88819 Insulin resistance, unspecified: Secondary | ICD-10-CM | POA: Insufficient documentation

## 2022-06-24 LAB — TSH+T4F+T3FREE
Free T4: 1.35 ng/dL (ref 0.82–1.77)
T3, Free: 3.3 pg/mL (ref 2.0–4.4)
TSH: 1.82 u[IU]/mL (ref 0.450–4.500)

## 2022-06-24 LAB — VITAMIN B12: Vitamin B-12: 480 pg/mL (ref 232–1245)

## 2022-06-24 LAB — INSULIN, RANDOM: INSULIN: 27.1 u[IU]/mL — ABNORMAL HIGH (ref 2.6–24.9)

## 2022-06-24 LAB — VITAMIN D 25 HYDROXY (VIT D DEFICIENCY, FRACTURES): Vit D, 25-Hydroxy: 56 ng/mL (ref 30.0–100.0)

## 2022-06-24 NOTE — Telephone Encounter (Signed)
6/18 Patient Is worried about her lab results. Patient wold like for someone to give her a call before her July 1 appointment. JE

## 2022-06-24 NOTE — Progress Notes (Signed)
Chief Complaint:   OBESITY [name] (MR# [id number]) is a 67 y.o. female who presents for evaluation and treatment of obesity and related comorbidities. Current BMI is Body mass index is 39.27 kg/m. [name] has been struggling with her weight for many years and has been unsuccessful in either losing weight, maintaining weight loss, or reaching her healthy weight goal.  [name] is currently in the action stage of change and ready to dedicate time achieving and maintaining a healthier weight. [name] is interested in becoming our patient and working on intensive lifestyle modifications including (but not limited to) diet and exercise for weight loss.  Patient saw Judeth Cornfield, nurse practitioner on 05/28/2022 for an information session visit.  She saw Dr. Dalbert Garnet at the [facility] office back in 2023, and I last saw the patient in 2022.  [name]'s habits were reviewed today and are as follows: Her family eats meals together, she thinks her family will eat healthier with her, her desired weight loss is 56 lbs, she started gaining weight during menopause, her heaviest weight ever was 235 pounds, she is a picky eater and doesn't like to eat healthier foods, she has significant food cravings issues, she snacks frequently in the evenings, she skips meals frequently, she frequently makes poor food choices, and she struggles with emotional eating.  Depression Screen [name]'s Food and Mood (modified PHQ-9) score was 15.  Subjective:   1. Other fatigue [name] admits to daytime somnolence and admits to waking up still tired. Patient has a history of symptoms of daytime fatigue and morning fatigue. Keasha generally gets 4 hours of sleep per night, and states that she has nightime awakenings and generally restful sleep. Snoring is present. Apneic episodes are present. Epworth Sleepiness Score is 4.   2. SOB (shortness of breath) on exertion [name] notes increasing shortness of breath with exercising and seems  to be worsening over time with weight gain. She notes getting out of breath sooner with activity than she used to. This has not gotten worse recently. [name] denies shortness of breath at rest or orthopnea.  3. Essential hypertension Patient is taking propranolol, metoprolol, and Norvasc.  Her blood pressure is stable.  4. OSA (obstructive sleep apnea) Patient is wearing a CPAP nightly.  5. Prediabetes Patient is not on medications.  Her last A1c was 5.7.  6. NAFLD (nonalcoholic fatty liver disease) Patient was diagnosed by CT abdominal CT scan.  She is not on medications.  7. B12 deficiency Patient is taking multivitamins with calcium and vitamin D.  8. Vitamin D deficiency Patient is taking multivitamins with calcium and vitamin D.  Assessment/Plan:   1. Other fatigue [name] does feel that her weight is causing her energy to be lower than it should be. Fatigue may be related to obesity, depression or many other causes. Labs will be ordered, and in the meanwhile, [name] will focus on self care including making healthy food choices, increasing physical activity and focusing on stress reduction.  - Insulin, random - Vitamin B12 - VITAMIN D 25 Hydroxy (Vit-D Deficiency, Fractures) - TSH+T4F+T3Free  2. SOB (shortness of breath) on exertion [name] does feel that she gets out of breath more easily that she used to when she exercises. [name]'s shortness of breath appears to be obesity related and exercise induced. She has agreed to work on weight loss and gradually increase exercise to treat her exercise induced shortness of breath. Will continue to monitor closely.  3. Essential hypertension Patient will continue her  current medications.  4. OSA (obstructive sleep apnea) Patient will continue to wear her CPAP.  5. Prediabetes We will check labs today, and we will follow-up at patient's next visit.  - Insulin, random  6. NAFLD (nonalcoholic fatty liver disease) Patient will work on  her diet and exercise.  - Insulin, random  7. B12 deficiency We will check labs today, and we will follow-up at patient's next visit.  - Vitamin B12  8. Vitamin D deficiency We will check labs today, and we will follow-up at patient's next visit.  - VITAMIN D 25 Hydroxy (Vit-D Deficiency, Fractures)  9. Depression screening [name] had a positive depression screening. Depression is commonly associated with obesity and often results in emotional eating behaviors. We will monitor this closely and work on CBT to help improve the non-hunger eating patterns. Referral to Psychology may be required if no improvement is seen as she continues in our clinic.  10. Generalized obesity - Insulin, random - Vitamin B12 - VITAMIN D 25 Hydroxy (Vit-D Deficiency, Fractures) - TSH+T4F+T3Free  11. BMI 39.0-39.9,adult [name] is currently in the action stage of change and her goal is to continue with weight loss efforts. I recommend October begin the structured treatment plan as follows:  She has agreed to the Category 2 Plan.  Meal planning was discussed.  Review labs with the patient from 04/23/2022, CMP, lipids, CBC, A1c, and glucose.  Patient will have a shake for lunch.  Exercise goals: No exercise has been prescribed at this time.   Behavioral modification strategies: increasing lean protein intake, decreasing simple carbohydrates, increasing vegetables, increasing water intake, decreasing eating out, no skipping meals, meal planning and cooking strategies, keeping healthy foods in the home, and planning for success.  She was informed of the importance of frequent follow-up visits to maximize her success with intensive lifestyle modifications for her multiple health conditions. She was informed we would discuss her lab results at her next visit unless there is a critical issue that needs to be addressed sooner. [name] agreed to keep her next visit at the agreed upon time to discuss these  results.  Objective:   Blood pressure (!) 147/79, pulse (!) 58, temperature 97.9 F (36.6 C), height 5\' 5"  (1.651 m), weight 236 lb (107 kg), SpO2 98 %. Body mass index is 39.27 kg/m.  EKG: Normal sinus rhythm, rate 70 BPM.  Indirect Calorimeter completed today shows a VO2 of 242 and a REE of 1670.  Her calculated basal metabolic rate is 1914 thus her basal metabolic rate is worse than expected.  General: Cooperative, alert, well developed, in no acute distress. HEENT: Conjunctivae and lids unremarkable. Cardiovascular: Regular rhythm.  Lungs: Normal work of breathing. Neurologic: No focal deficits.   Lab Results  Component Value Date   CREATININE 0.78 04/23/2022   BUN 11 04/23/2022   NA 140 04/23/2022   K 4.4 04/23/2022   CL 104 04/23/2022   CO2 22 04/23/2022   Lab Results  Component Value Date   ALT 19 04/23/2022   AST 18 04/23/2022   ALKPHOS 98 04/23/2022   BILITOT 0.4 04/23/2022   Lab Results  Component Value Date   HGBA1C 5.7 (H) 04/23/2022   HGBA1C 5.5 05/02/2021   HGBA1C 5.6 07/29/2016   HGBA1C 5.8 04/22/2016   HGBA1C 5.8 (H) 01/28/2012   Lab Results  Component Value Date   INSULIN 9.2 01/25/2020   Lab Results  Component Value Date   TSH 1.270 04/23/2022   Lab Results  Component Value  Date   CHOL 147 04/23/2022   HDL 50 04/23/2022   LDLCALC 79 04/23/2022   TRIG 96 04/23/2022   CHOLHDL 2.9 04/23/2022   Lab Results  Component Value Date   WBC 4.2 04/23/2022   HGB 14.9 04/23/2022   HCT 43.7 04/23/2022   MCV 96 04/23/2022   PLT 324 04/23/2022   Lab Results  Component Value Date   IRON 76 12/13/2007   Attestation Statements:   Reviewed by clinician on day of visit: allergies, medications, problem list, medical history, surgical history, family history, social history, and previous encounter notes.   Trude Mcburney, am acting as Energy manager for [employer], DO.  I have reviewed the above documentation for accuracy and completeness,  and I agree with the above. Corinna Capra, DO

## 2022-07-07 ENCOUNTER — Encounter: Payer: Self-pay | Admitting: Nurse Practitioner

## 2022-07-07 ENCOUNTER — Ambulatory Visit (INDEPENDENT_AMBULATORY_CARE_PROVIDER_SITE_OTHER): Payer: Medicare Other | Admitting: Nurse Practitioner

## 2022-07-07 ENCOUNTER — Telehealth: Payer: Self-pay

## 2022-07-07 VITALS — BP 124/74 | HR 61 | Temp 97.7°F | Ht 65.0 in | Wt 236.0 lb

## 2022-07-07 DIAGNOSIS — I5032 Chronic diastolic (congestive) heart failure: Secondary | ICD-10-CM | POA: Diagnosis not present

## 2022-07-07 DIAGNOSIS — Z6839 Body mass index (BMI) 39.0-39.9, adult: Secondary | ICD-10-CM

## 2022-07-07 DIAGNOSIS — E669 Obesity, unspecified: Secondary | ICD-10-CM

## 2022-07-07 DIAGNOSIS — I10 Essential (primary) hypertension: Secondary | ICD-10-CM | POA: Diagnosis not present

## 2022-07-07 DIAGNOSIS — I480 Paroxysmal atrial fibrillation: Secondary | ICD-10-CM

## 2022-07-07 DIAGNOSIS — G4733 Obstructive sleep apnea (adult) (pediatric): Secondary | ICD-10-CM | POA: Diagnosis not present

## 2022-07-07 DIAGNOSIS — R7303 Prediabetes: Secondary | ICD-10-CM

## 2022-07-07 MED ORDER — WEGOVY 0.25 MG/0.5ML ~~LOC~~ SOAJ
0.2500 mg | SUBCUTANEOUS | 0 refills | Status: DC
Start: 2022-07-07 — End: 2022-09-19

## 2022-07-07 NOTE — Telephone Encounter (Signed)
PA submitted through Cover My Meds for [facility]. Awaiting insurance determination.  Key: [id number]

## 2022-07-07 NOTE — Progress Notes (Signed)
Office: [phone]  /  Fax: [phone]  WEIGHT SUMMARY AND BIOMETRICS  Weight Lost Since Last Visit: 0lb  Weight Gained Since Last Visit: 0lb   Vitals Temp: 97.7 F (36.5 C) BP: 124/74 Pulse Rate: 61 SpO2: 98 %   Anthropometric Measurements Height: 5\' 5"  (1.651 m) Weight: 236 lb (107 kg) BMI (Calculated): 39.27 Weight at Last Visit: 236lb Weight Lost Since Last Visit: 0lb Weight Gained Since Last Visit: 0lb Starting Weight: 236lb Total Weight Loss (lbs): 0 lb (0 kg)   Body Composition  Body Fat %: 46.9 % Fat Mass (lbs): 110.6 lbs Muscle Mass (lbs): 119 lbs Total Body Water (lbs): 85.4 lbs Visceral Fat Rating : 15   Other Clinical Data Fasting: Yes Labs: No Today's Visit #: 2 Starting Date: 06/23/22     HPI  Chief Complaint: OBESITY  Yasina is here to discuss her progress with her obesity treatment plan. She is on the the Category 2 Plan and states she is following her eating plan approximately 75 % of the time. She states she is exercising 30 minutes 3 days per week.   Interval History:  Since last office visit she has maintained her weight. She is eating 2 meals per day.  She is a stress eater and has been trying to snack on fruits and switched to vegan cookies.   She has "tried to cut out her carbs"-has stopped breads/pasta. She has been trying to move more.  She is drinking more water.  Denies sugary drinks.  Also drinking coffee with cream.      Pharmacotherapy for weight loss: She is not currently taking medications  for medical weight loss.    Previous pharmacotherapy for medical weight loss:  She took Ozempic and Metformin in the past for pre diabetes. She had side effects of constipation on Ozempic and diarrhea on Metformin.   Bariatric surgery:  Patient has not had bariatric surgery.    Obstructive Sleep Apnea Kimera has a diagnosis of sleep apnea. She reports that she is now using a CPAP regularly.  She has a follow up appt on July 8th.   Reports fatigue.   Prediabetes/IR Last A1c was 5.7  Medication(s): not currently on meds.  She took Ozempic and Metformin in the past for pre diabetes. She had side effects of constipation on Ozempic and diarrhea on Metformin.  Polyphagia:Yes  Lab Results  Component Value Date   HGBA1C 5.7 (H) 04/23/2022   HGBA1C 5.5 05/02/2021   HGBA1C 5.6 07/29/2016   HGBA1C 5.8 04/22/2016   HGBA1C 5.8 (H) 01/28/2012   Lab Results  Component Value Date   INSULIN 27.1 (H) 06/23/2022   INSULIN 9.2 01/25/2020   HTN/CHF/a fib Hypertension BP looks better today.  Medication(s): Norvasc 5mg , Toprol XL 25mg  & inderal 10mg .  Denies side effects.   Denies chest pain, palpitations and SOB.  BP Readings from Last 3 Encounters:  07/07/22 124/74  06/23/22 (!) 147/79  05/28/22 (!) 145/80   Lab Results  Component Value Date   CREATININE 0.78 04/23/2022   CREATININE 0.91 05/15/2021   CREATININE 0.70 05/05/2021     PHYSICAL EXAM:  Blood pressure 124/74, pulse 61, temperature 97.7 F (36.5 C), height 5\' 5"  (1.651 m), weight 236 lb (107 kg), SpO2 98 %. Body mass index is 39.27 kg/m.  General: She is overweight, cooperative, alert, well developed, and in no acute distress. PSYCH: Has normal mood, affect and thought process.   Extremities: No edema.  Neurologic: No gross sensory or motor  deficits. No tremors or fasciculations noted.    DIAGNOSTIC DATA REVIEWED:  BMET    Component Value Date/Time   NA 140 04/23/2022 1039   K 4.4 04/23/2022 1039   CL 104 04/23/2022 1039   CO2 22 04/23/2022 1039   GLUCOSE 94 04/23/2022 1039   GLUCOSE 154 (H) 05/15/2021 0339   BUN 11 04/23/2022 1039   CREATININE 0.78 04/23/2022 1039   CALCIUM 9.9 04/23/2022 1039   GFRNONAA >60 05/15/2021 0339   GFRAA 69 10/01/2018 0855   Lab Results  Component Value Date   HGBA1C 5.7 (H) 04/23/2022   HGBA1C  07/18/2008    5.7 (NOTE) The ADA recommends the following therapeutic goal for glycemic control related to Hgb  A1c measurement: Goal of therapy: <6.5 Hgb A1c  Reference: American Diabetes Association: Clinical Practice Recommendations 2010, Diabetes Care, 2010, 33: (Suppl  1).   Lab Results  Component Value Date   INSULIN 27.1 (H) 06/23/2022   INSULIN 9.2 01/25/2020   Lab Results  Component Value Date   TSH 1.820 06/23/2022   CBC    Component Value Date/Time   WBC 4.2 04/23/2022 1039   WBC 20.1 (H) 05/16/2021 0329   RBC 4.54 04/23/2022 1039   RBC 3.60 (L) 05/16/2021 0329   HGB 14.9 04/23/2022 1039   HCT 43.7 04/23/2022 1039   PLT 324 04/23/2022 1039   MCV 96 04/23/2022 1039   MCH 32.8 04/23/2022 1039   MCH 32.8 05/16/2021 0329   MCHC 34.1 04/23/2022 1039   MCHC 33.7 05/16/2021 0329   RDW 12.9 04/23/2022 1039   Iron Studies    Component Value Date/Time   IRON 76 12/13/2007 1043   IRONPCTSAT 26.6 12/13/2007 1043   Lipid Panel     Component Value Date/Time   CHOL 147 04/23/2022 1039   TRIG 96 04/23/2022 1039   HDL 50 04/23/2022 1039   CHOLHDL 2.9 04/23/2022 1039   CHOLHDL 3 04/22/2016 1234   VLDL 20.6 04/22/2016 1234   LDLCALC 79 04/23/2022 1039   Hepatic Function Panel     Component Value Date/Time   PROT 6.9 04/23/2022 1039   ALBUMIN 4.1 04/23/2022 1039   AST 18 04/23/2022 1039   ALT 19 04/23/2022 1039   ALKPHOS 98 04/23/2022 1039   BILITOT 0.4 04/23/2022 1039   BILIDIR 0.16 10/01/2018 0857   IBILI NOT CALCULATED 09/19/2014 1515      Component Value Date/Time   TSH 1.820 06/23/2022 0945   Nutritional Lab Results  Component Value Date   VD25OH 56.0 06/23/2022   VD25OH 70.2 01/25/2020   VD25OH 62 02/07/2013     ASSESSMENT AND PLAN  TREATMENT PLAN FOR OBESITY:  Recommended Dietary Goals  Anglina is currently in the action stage of change. As such, her goal is to continue weight management plan. She has agreed to the Category 2 Plan.  To track and will review macros at her next visit.    Behavioral Intervention  We discussed the following Behavioral  Modification Strategies today: increasing lean protein intake, decreasing simple carbohydrates , increasing vegetables, increasing lower glycemic fruits, increasing fiber rich foods, avoiding skipping meals, increasing water intake, continue to practice mindfulness when eating, and planning for success.  Additional resources provided today: Wegovy and insulin resistance  Recommended Physical Activity Goals  Kymber has been advised to work up to 150 minutes of moderate intensity aerobic activity a week and strengthening exercises 2-3 times per week for cardiovascular health, weight loss maintenance and preservation of muscle mass.  She has agreed to Continue current level of physical activity  and Think about ways to increase daily physical activity and overcoming barriers to exercise   Pharmacotherapy We discussed various medication options to help [facility] with her weight loss efforts and we both agreed to start [facility] 0.25mg .  Side effects discussed.  She would benefit from using Wegovy due to cardiac history (CHF, HTN, PAF), OSAS, pre diabetes, IR, fatty liver, OA to reduce the risks of MI and/or CVA.   See the SELECT study.    Contraindications:  Pancreatitis (active gallstones) Medullary thyroid cancer High triglycerides (>500)-will need labs prior to starting Multiple Endocrine Neoplasia syndrome type 2 (MEN 2) Trying to get pregnant Breastfeeding Use with caution with taking insulin or sulfonylureas (will need to monitor blood sugars for hypoglycemia)  Avoid Phentermine, Qsymia and Contrave due to CHF, HTN, PAF  ASSOCIATED CONDITIONS ADDRESSED TODAY  Action/Plan  OSA (obstructive sleep apnea) -     ZOXWRU; Inject 0.25 mg into the skin once a week.  Dispense: 2 mL; Refill: 0  Prediabetes -     EAVWUJ; Inject 0.25 mg into the skin once a week.  Dispense: 2 mL; Refill: 0  Nene will continue to work on weight loss, exercise, and decreasing simple carbohydrates to help decrease the  risk of diabetes.    Chronic heart failure with preserved ejection fraction (HCC) -     WJXBJY; Inject 0.25 mg into the skin once a week.  Dispense: 2 mL; Refill: 0  Continue to follow up with PCP and cardiology.  Continue meds as directed.    Essential hypertension -     [id number]; Inject 0.25 mg into the skin once a week.  Dispense: 2 mL; Refill: 0  Continue to follow up with PCP and cardiology.  Continue meds as directed.  PAF (paroxysmal atrial fibrillation) (HCC) -     NWGNFA; Inject 0.25 mg into the skin once a week.  Dispense: 2 mL; Refill: 0  Continue to follow up with PCP and cardiology.  Continue meds as directed.  Generalized obesity -     [id number]; Inject 0.25 mg into the skin once a week.  Dispense: 2 mL; Refill: 0  BMI 39.0-39.9,adult -     OZHYQM; Inject 0.25 mg into the skin once a week.  Dispense: 2 mL; Refill: 0      Labs reviewed in chart from 06/23/22   Return in about 2 weeks (around 07/21/2022).[name] She was informed of the importance of frequent follow up visits to maximize her success with intensive lifestyle modifications for her multiple health conditions.   ATTESTASTION STATEMENTS:  Reviewed by clinician on day of visit: allergies, medications, problem list, medical history, surgical history, family history, social history, and previous encounter notes.     Theodis Sato. Cheron Coryell FNP-C

## 2022-07-07 NOTE — Patient Instructions (Addendum)
Steps to starting your WegovyT  The office staff will send a prior authorization request to your insurance company for approval. We will send you a mychart message once we hear back from your insurance with a decision.  This can take up to 7-10 business days.   Once your WegovyTis approved, you may then pick up Wegovy pen from your pharmacy.    Learn how to do Wegovy injections on the Wegovy.com website. There is a training video that will walk you through how to safely perform the injection. If you have questions for our clinical staff, please contact our  clinical staff. If you have any symptoms of allergic reaction to WegovyT discontinue immediately and call 911.  1. What should I tell my provider before using WegovyT ? have or have had problems with your pancreas or kidneys. have type 2 diabetes and a history of diabetic retinopathy. have or have had depression, suicidal thoughts, or mental health issues. are pregnant or plan to become pregnant. WegovyT may harm your unborn baby. You should stop using WegovyT 3 months before you plan to become pregnant or if you are breastfeeding or plan to breastfeed. It is not known if WegovyT passes into your breast milk.  2. What is WegovyT and how does it work?  WegovyT is an injectable prescription medication prescribed by your provider to help with your weight loss.  This medicine will be most effective when combined with a reduced calorie diet and physical activity.  WegovyT is not for the treatment of type 2 diabetes mellitus. WegovyT should not be used with other GLP-1 receptor agonist medicines. The addition of WegovyT in  patients treated with insulin has not been evaluated. When initiating WegovyT, consider reducing the dose of concomitantly administered insulin secretagogues (such as sulfonylureas) or insulin to reduce the risk of  hypoglycemia.  One role of GLP-1 is to send a signal to your brain to tell it you are full. It also slows down  stomach emptying which will make you feel full longer and may help with reducing cravings.   3.  How should I take WegovyT?  Administer WegovyT once weekly, on the same day each week, at any time of day, with or without meals Inject subcutaneously in the abdomen, thigh or upper arm Initiate at 0.25 mg once weekly for 4 weeks. In 4 week intervals, increase the dose until a dose of 2.4 mg is reached (we will discuss with you the dosage at each visit). The maintenance dose of WegovyT is 2.4 mg once weekly.  The dosing schedule of Wegovy is:  0.25 mg per week X 4 weeks 0.5 mg per week X 4 weeks 1.0 mg per week X 4 weeks 1.7 mg per week X 4 weeks 2.4 mg per week   Missed dose   If you miss your injection day, go ahead inject your current dose. You can go >7 days, but not <7 days between injections. You may change your injection day (It must be >7 days). If you miss >2 doses, you can still keep next injection dose the same or follow de-escalation schedule which may minimize GI symptoms.   In patients with type 2 diabetes, monitor blood glucose prior to starting and during WEGOVYT treatment.   Inject your dose of Wegovy under the skin (subcutaneous injection) in your stomach area (abdomen), upper leg (thigh) or upper arm. Do not inject into a vein or a muscle. The injection site should be rotated and not given in the   same spot each day. Hold the needle under the skin and count to "10". This will allow all of the medicine to be dispensed under the skin. Always wipe your skin with an alcohol prep pad before injection  Dispose of used pen in an approved sharps container. More practical options that can be put in the trash  to go to the landfill are milk jugs or plastic laundry detergent containers with a screw on lid.  What side effects may I notice from taking WegovyT?  Side effects that usually do not require medical attention (report to our office if they continue or are bothersome): Nausea  (most common but decreases over time in most people as their body gets used to the medicine) Diarrhea Constipation (you may take an over the counter laxative if needed) Headache Decreased appetite Upset stomach Tiredness Dizziness Feeling bloated Hair loss Belching Gas Heartburn  Side effects that you should call 911 as soon as possible Vomiting Stomach pain Fever Yellowing of your skin or eyes  Clay-colored stools Increased heart rate while at rest Low blood sugar  Sudden changes in mood, behaviors, thoughts, feelings, or thoughts of suicide If you get a lump or swelling in your neck, hoarseness, trouble swallowing, or shortness of breath. Allergic reaction such as skin rash, itching, hives, swelling of the face, tongue, or lips  Helpful tips for managing nausea Nausea is a common side effect when first starting WegovyT. If you experience nausea, be sure to connect with your health care provider. He or she will offer guidance on ways to manage it, which may include: Eat bland, low-fat foods, like crackers, toast and rice  Eat foods that contain water, like soups and gelatin  Avoid lying down after you eat  Go outdoors for fresh air  Eat more slowly    Other important information Do not drop your pen or knock it against hard surfaces  Do not expose your pen to any liquids  If you think that your pen may be damaged, do not try to fix it. Use a new one Keep the pen cap on until you are ready to inject. Your pen will no longer be sterile if you store an unused pen without the cap, if you pull the pen cap off and put it on again, or if the pen cap is missing. This could lead to an infection  Store the WegovyT pen in the refrigerator from 36F to 46F (2C to 8C) If needed, before removing the pen cap, WegovyT can be stored from 8C to 30C (46F to 86F) in the original carton for up to 28 days.  Keep WegovyT in the original carton to protect it from light  Do not freeze   Throw away pen if WegovyT has been frozen, has been exposed to light or temperatures above 86F (30C), or has been out of the refrigerator for 28 days or longer It's important to properly dispose of your used WegovyT pens. Do not throw the pen away in your household trash. Instead, use an FDA-cleared sharps disposable container or a sturdy household container with a tight-fitting lid, like a heavy duty plastic container.   Wegovy pen training website: https://www.wegovy.com/about-wegovy/how-to-use-the-wegovy-pen.html  Wegovy savings and support link: https://www.wegovy.com/saving-and-support/save-and-support.html    What is a GLP-1 Glucagon like peptide-1 (GLP-1) agonists represent a class of medications used to treat type 2 diabetes mellitus and obesity.  GLP-1 medications mimic the action of a hormone called glucagon like peptide 1.  When blood sugar levels start to   rise/increase these drugs stimulate the body to produce more insulin.  When that happens, the extra insulin helps to lower the blood sugar levels in the body.  This in returns helps with decreasing cravings.  These medications also slow the movement of food from the stomach into the small intestine.  This in return helps one to full faster and longer.   Diabetic medications: Approved for treatment of diabetes mellitus but does not have full approval for weight loss use Victoza (liraglutide) Ozempic (semaglutide) Mounjaro Trulicity Rybelsus  Weight loss medications: Approved for long-term weight loss use.        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  Other possible side effects are headaches, belching, diarrhea, tiredness (fatigue), vomiting, upset stomach, dizziness, heartburn and stomach (abdominal pain).  If you think that you are becoming dehydrated, please inform our office or your primary family provider.  Stop immediately and go to ER if you have any symptoms of a serious allergic reaction including swelling of your face, lips, tongue or throat; problems breathing or swallowing; severe rash or itching; fainting or feeling dizzy; or very rapid heart rate.                                                                                             

## 2022-07-07 NOTE — Telephone Encounter (Signed)
Received through Cover My Meds: Medication not covered under Medicare. Will attempt to submit through [employer].

## 2022-07-15 DIAGNOSIS — G4733 Obstructive sleep apnea (adult) (pediatric): Secondary | ICD-10-CM | POA: Diagnosis not present

## 2022-07-15 DIAGNOSIS — I10 Essential (primary) hypertension: Secondary | ICD-10-CM | POA: Diagnosis not present

## 2022-07-16 ENCOUNTER — Telehealth (INDEPENDENT_AMBULATORY_CARE_PROVIDER_SITE_OTHER): Payer: Self-pay | Admitting: Nurse Practitioner

## 2022-07-16 NOTE — Telephone Encounter (Signed)
Insurance will not cover [phone] but will cover Mounjaro. Patient is wanting a call back.

## 2022-07-17 NOTE — Telephone Encounter (Signed)
Mychart message

## 2022-07-22 ENCOUNTER — Encounter: Payer: Self-pay | Admitting: Nurse Practitioner

## 2022-07-22 ENCOUNTER — Ambulatory Visit: Payer: Medicare Other | Admitting: Nurse Practitioner

## 2022-07-22 VITALS — BP 145/78 | HR 63 | Temp 98.1°F | Ht 65.0 in | Wt 237.0 lb

## 2022-07-22 DIAGNOSIS — Z6839 Body mass index (BMI) 39.0-39.9, adult: Secondary | ICD-10-CM | POA: Diagnosis not present

## 2022-07-22 DIAGNOSIS — I10 Essential (primary) hypertension: Secondary | ICD-10-CM | POA: Diagnosis not present

## 2022-07-22 NOTE — Progress Notes (Signed)
Office: [phone]  /  Fax: [phone]  WEIGHT SUMMARY AND BIOMETRICS  Weight Lost Since Last Visit: 0lb  Weight Gained Since Last Visit: 1lb   Vitals Temp: 98.1 F (36.7 C) BP: (!) 145/78 Pulse Rate: 63 SpO2: 96 %   Anthropometric Measurements Height: 5\' 5"  (1.651 m) Weight: 237 lb (107.5 kg) BMI (Calculated): 39.44 Weight at Last Visit: 236lb Weight Lost Since Last Visit: 0lb Weight Gained Since Last Visit: 1lb Starting Weight: 236lb Total Weight Loss (lbs): 0 lb (0 kg)   Body Composition  Body Fat %: 47.3 % Fat Mass (lbs): 112.2 lbs Muscle Mass (lbs): 118.4 lbs Total Body Water (lbs): 86 lbs Visceral Fat Rating : 15   Other Clinical Data Fasting: No Labs: No Today's Visit #: 3 Starting Date: 06/23/22     HPI  Chief Complaint: OBESITY  Mystery is here to discuss her progress with her obesity treatment plan. She is on the the Category 2 Plan and states she is following her eating plan approximately 50 % of the time. She states she is exercising 15 minutes 3 days per week.   Interval History:  Since last office visit she gained 1 pound.  She is skipping meals.  She is aiming to eat more protein.  She is drinking water and tea.  Denies coffee or sugary drinks.     Pharmacotherapy for weight loss: She is not currently taking medications  for medical weight loss.  Unable to start GLP-1s due to cost.    Previous pharmacotherapy for medical weight loss:  She took Ozempic and Metformin in the past for pre diabetes. She had side effects of constipation on Ozempic and diarrhea on Metformin.   Bariatric surgery:  Patient has not had bariatric surgery.  She is interested in surgery but reports she was told not to proceed by cardiology.  Has an upcoming appt with card and plans to discuss in more detail.    Hypertension Hypertension:  BP is elevated.   Has appt with card in Sept.   Medication(s): Norvasc 5mg , Toprol XL 25mg , Inderal 10mg  Denies chest pain,  palpitations and SOB.  BP Readings from Last 3 Encounters:  07/22/22 (!) 145/78  07/07/22 124/74  06/23/22 (!) 147/79   Lab Results  Component Value Date   CREATININE 0.78 04/23/2022   CREATININE 0.91 05/15/2021   CREATININE 0.70 05/05/2021     PHYSICAL EXAM:  Blood pressure (!) 145/78, pulse 63, temperature 98.1 F (36.7 C), height 5\' 5"  (1.651 m), weight 237 lb (107.5 kg), SpO2 96%. Body mass index is 39.44 kg/m.  General: She is overweight, cooperative, alert, well developed, and in no acute distress. PSYCH: Has normal mood, affect and thought process.   Extremities: No edema.  Neurologic: No gross sensory or motor deficits. No tremors or fasciculations noted.    DIAGNOSTIC DATA REVIEWED:  BMET    Component Value Date/Time   NA 140 04/23/2022 1039   K 4.4 04/23/2022 1039   CL 104 04/23/2022 1039   CO2 22 04/23/2022 1039   GLUCOSE 94 04/23/2022 1039   GLUCOSE 154 (H) 05/15/2021 0339   BUN 11 04/23/2022 1039   CREATININE 0.78 04/23/2022 1039   CALCIUM 9.9 04/23/2022 1039   GFRNONAA >60 05/15/2021 0339   GFRAA 69 10/01/2018 0855   Lab Results  Component Value Date   HGBA1C 5.7 (H) 04/23/2022   HGBA1C  07/18/2008    5.7 (NOTE) The ADA recommends the following therapeutic goal for glycemic control related to Hgb  A1c measurement: Goal of therapy: <6.5 Hgb A1c  Reference: American Diabetes Association: Clinical Practice Recommendations 2010, Diabetes Care, 2010, 33: (Suppl  1).   Lab Results  Component Value Date   INSULIN 27.1 (H) 06/23/2022   INSULIN 9.2 01/25/2020   Lab Results  Component Value Date   TSH 1.820 06/23/2022   CBC    Component Value Date/Time   WBC 4.2 04/23/2022 1039   WBC 20.1 (H) 05/16/2021 0329   RBC 4.54 04/23/2022 1039   RBC 3.60 (L) 05/16/2021 0329   HGB 14.9 04/23/2022 1039   HCT 43.7 04/23/2022 1039   PLT 324 04/23/2022 1039   MCV 96 04/23/2022 1039   MCH 32.8 04/23/2022 1039   MCH 32.8 05/16/2021 0329   MCHC 34.1  04/23/2022 1039   MCHC 33.7 05/16/2021 0329   RDW 12.9 04/23/2022 1039   Iron Studies    Component Value Date/Time   IRON 76 12/13/2007 1043   IRONPCTSAT 26.6 12/13/2007 1043   Lipid Panel     Component Value Date/Time   CHOL 147 04/23/2022 1039   TRIG 96 04/23/2022 1039   HDL 50 04/23/2022 1039   CHOLHDL 2.9 04/23/2022 1039   CHOLHDL 3 04/22/2016 1234   VLDL 20.6 04/22/2016 1234   LDLCALC 79 04/23/2022 1039   Hepatic Function Panel     Component Value Date/Time   PROT 6.9 04/23/2022 1039   ALBUMIN 4.1 04/23/2022 1039   AST 18 04/23/2022 1039   ALT 19 04/23/2022 1039   ALKPHOS 98 04/23/2022 1039   BILITOT 0.4 04/23/2022 1039   BILIDIR 0.16 10/01/2018 0857   IBILI NOT CALCULATED 09/19/2014 1515      Component Value Date/Time   TSH 1.820 06/23/2022 0945   Nutritional Lab Results  Component Value Date   VD25OH 56.0 06/23/2022   VD25OH 70.2 01/25/2020   VD25OH 62 02/07/2013     ASSESSMENT AND PLAN  TREATMENT PLAN FOR OBESITY:  Recommended Dietary Goals  Sabena is currently in the action stage of change. As such, her goal is to continue weight management plan. She has agreed to the Category 2 Plan.  Behavioral Intervention  We discussed the following Behavioral Modification Strategies today: increasing lean protein intake, decreasing simple carbohydrates , increasing vegetables, increasing lower glycemic fruits, increasing water intake, reading food labels , keeping healthy foods at home, continue to practice mindfulness when eating, and planning for success.  Additional resources provided today: NA  Recommended Physical Activity Goals  Ahlayah has been advised to work up to 150 minutes of moderate intensity aerobic activity a week and strengthening exercises 2-3 times per week for cardiovascular health, weight loss maintenance and preservation of muscle mass.   She has agreed to Continue current level of physical activity , Think about ways to increase daily  physical activity and overcoming barriers to exercise, and Increase physical activity in their day and reduce sedentary time (increase NEAT).   Pharmacotherapy We discussed various medication options to help [facility] with her weight loss efforts and we both agreed to discuss options with cardiology-she plans to discuss surgery and [facility] with cardiology at her next appointment.  She would benefit from using Wegovy due to cardiac history (CHF, HTN, PAF), OSAS, pre diabetes, IR, fatty liver, OA to reduce the risks of MI and/or CVA.   See the SELECT study.    ASSOCIATED CONDITIONS ADDRESSED TODAY  Action/Plan  Essential hypertension Keep follow up appt with cardiology.  Continue meds as directed.   Morbid obesity (HCC) Wants  to discuss [facility] with cardiology.  Unable to start due to cost.    BMI 39.0-39.9,adult         Return in about 4 weeks (around 08/19/2022).[name] She was informed of the importance of frequent follow up visits to maximize her success with intensive lifestyle modifications for her multiple health conditions.   ATTESTASTION STATEMENTS:  Reviewed by clinician on day of visit: allergies, medications, problem list, medical history, surgical history, family history, social history, and previous encounter notes.   Time spent on visit including pre-visit chart review and post-visit care and charting was 30 minutes.    Theodis Sato. Dlisa Barnwell FNP-C

## 2022-07-24 ENCOUNTER — Ambulatory Visit (INDEPENDENT_AMBULATORY_CARE_PROVIDER_SITE_OTHER): Payer: Medicare Other | Admitting: Family Medicine

## 2022-07-24 ENCOUNTER — Encounter: Payer: Self-pay | Admitting: Bariatrics

## 2022-07-24 ENCOUNTER — Encounter: Payer: Self-pay | Admitting: Family Medicine

## 2022-07-24 VITALS — BP 145/77 | HR 59 | Temp 98.5°F | Resp 18 | Ht 65.0 in | Wt 239.4 lb

## 2022-07-24 DIAGNOSIS — I10 Essential (primary) hypertension: Secondary | ICD-10-CM

## 2022-07-24 NOTE — Progress Notes (Signed)
   Established Patient Office Visit  Subjective   Patient ID: [name], female    DOB: April 26, 1955  Age: 67 y.o. MRN: [id number]  Chief Complaint  Patient presents with   Hypertension    Patient has been having issues with her BP being higher within the last week, She states that she has been having tension headaches , fatigue, and weight gain.     HPI  Hypertension Medication compliance: taking amlodipine 5 mg, metoprolol 25 mg  Denies chest pain, shortness of breath, lower extremity edema, vision changes, headaches. Headache improved with Tylenol. Reports increased stress, has not been resting. Neck pain improves with massaging the back of her neck.  Pertinent lab work: 04/23/22 CMP normal. Monitoring at home: 130/80, 127/unsure Tolerating medication well: no reports of side effects  Continue current medication regimen: no changes, recommend follow-up with cardiology and try to reduce stress today and take time for self care.  Follow-up: Call Dr. Elease Hashimoto for follow-up on blood pressure Recommend massage for neck tension.    Review of Systems  Eyes:  Negative for blurred vision and double vision.  Respiratory:  Negative for shortness of breath.   Cardiovascular:  Negative for chest pain.  Neurological:  Positive for headaches (resolvled with Tylenol, improves with massage).      Objective:     BP (!) 145/77   Pulse (!) 59   Temp 98.5 F (36.9 C) (Oral)   Resp 18   Ht 5\' 5"  (1.651 m)   Wt 239 lb 6.4 oz (108.6 kg)   SpO2 98%   BMI 39.84 kg/m  BP Readings from Last 3 Encounters:  07/24/22 (!) 145/77  07/22/22 (!) 145/78  07/07/22 124/74      Physical Exam Vitals and nursing note reviewed.  Constitutional:      General: She is not in acute distress.    Appearance: Normal appearance.  Cardiovascular:     Rate and Rhythm: Normal rate and regular rhythm.     Heart sounds: Normal heart sounds.  Pulmonary:     Effort: Pulmonary effort is normal.     Breath  sounds: Normal breath sounds.  Skin:    General: Skin is warm and dry.     Capillary Refill: Capillary refill takes less than 2 seconds.  Neurological:     General: No focal deficit present.     Mental Status: She is alert. Mental status is at baseline.  Psychiatric:        Mood and Affect: Mood normal.        Behavior: Behavior normal.        Thought Content: Thought content normal.        Judgment: Judgment normal.     No results found for any visits on 07/24/22.    The 10-year ASCVD risk score (Arnett DK, et al., 2019) is: 9.5%    Assessment & Plan:   Problem List Items Addressed This Visit     Essential hypertension - Primary  Tension headache relieved by Tylenol and neck massage. Recommend self-care and stress reduction today. She will notify cardiology office to seek advice on elevated blood pressure. No red flags today. Follow-up with PCP.   Return if symptoms worsen or fail to improve.    Novella Olive, FNP

## 2022-08-04 DIAGNOSIS — G4733 Obstructive sleep apnea (adult) (pediatric): Secondary | ICD-10-CM | POA: Diagnosis not present

## 2022-09-04 DIAGNOSIS — G4733 Obstructive sleep apnea (adult) (pediatric): Secondary | ICD-10-CM | POA: Diagnosis not present

## 2022-09-19 ENCOUNTER — Other Ambulatory Visit: Payer: Medicare Other | Admitting: Pharmacist

## 2022-09-19 NOTE — Progress Notes (Signed)
09/19/2022 Name: [name] MRN: [id number] DOB: 17-Jul-1955   Patient appearing on report for [employer] - Hypertension Control report due to last documented ambulatory blood pressure of 145/77 on 07/24/22. Next appointment with PCP is not scheduled   Outreached patient to discuss hypertension control and medication management.   Current antihypertensives: metoprolol XL 25mg  BID, amlodipine 5mg  daily  Patient does not have an automated upper arm home BP machine, has a wrist cuff.   Current blood pressure readings: 127/77 in June, 138/80 per home    Patient denies hypotensive signs and symptoms including dizziness, lightheadedness.  Patient denies hypertensive symptoms including headache, chest pain, shortness of breath.  Patient denies side effects related to medications     Assessment/Plan: - Currently uncontrolled per office reading but patient also reports stress, wanting to work on weight loss, and some home readings suggest possibly at goal <140/90.  - - Reviewed goal blood pressure <140/90 - Reviewed appropriate home BP monitoring technique (avoid caffeine, smoking, and exercise for 30 minutes before checking, rest for at least 5 minutes before taking BP, sit with feet flat on the floor and back against a hard surface, uncross legs, and rest arm on flat surface) - Recommend patient call number on back of insurance card to ask about OTC benefits for cost of BP cuff, continue current medications, contact cardiology for followup care.  Lynnda Shields, PharmD, BCPS Clinical Pharmacist [facility]

## 2022-09-19 NOTE — Patient Instructions (Signed)
Zamiah,   Thank you for speaking with me today! As discussed, call the number on the back of your insurance card to ask about over-the-counter benefits to assist with the cost of a new blood pressure cuff - the brand we recommend is "omron" and should be ~$30-40 on  the shelves at most pharmacies, or on [place].  Feel free to send me some more home blood pressure numbers in mychart and we can discuss if you'd like.  Below are some helpful tips about checking blood pressure:  Check your blood pressure twice weekly, and any time you have concerning symptoms like headache, chest pain, dizziness, shortness of breath, or vision changes.   Our goal is less than 140/90.  To appropriately check your blood pressure, make sure you do the following:  1) Avoid caffeine, exercise, or tobacco products for 30 minutes before checking. Empty your bladder. 2) Sit with your back supported in a flat-backed chair. Rest your arm on something flat (arm of the chair, table, etc). 3) Sit still with your feet flat on the floor, resting, for at least 5 minutes.  4) Check your blood pressure. Take 1-2 readings.  5) Write down these readings and bring with you to any provider appointments.  Bring your home blood pressure machine with you to a provider's office for accuracy comparison at least once a year.   Make sure you take your blood pressure medications before you come to any office visit, even if you were asked to fast for labs.

## 2022-09-26 ENCOUNTER — Ambulatory Visit (INDEPENDENT_AMBULATORY_CARE_PROVIDER_SITE_OTHER): Payer: Medicare Other | Admitting: Family Medicine

## 2022-09-26 ENCOUNTER — Encounter: Payer: Self-pay | Admitting: Family Medicine

## 2022-09-26 VITALS — BP 137/79 | HR 67 | Temp 98.1°F | Resp 18 | Ht 65.0 in | Wt 241.0 lb

## 2022-09-26 DIAGNOSIS — I10 Essential (primary) hypertension: Secondary | ICD-10-CM

## 2022-09-26 NOTE — Progress Notes (Signed)
Subjective:    Patient ID: [name], female    DOB: [birth year], 67 y.o.   MRN: [id number]  HPI Patient is here for a BP recheck   Review of Systems     Objective:   Physical Exam        Assessment & Plan:  Patient did not have any complaints or concerns at this time

## 2022-10-21 ENCOUNTER — Other Ambulatory Visit: Payer: Self-pay | Admitting: Cardiovascular Disease

## 2022-10-21 NOTE — Telephone Encounter (Signed)
Prescription refill request for Eliquis received. Indication:afib Last office visit:11/23 Scr:0.78  4/24 Age: 67 Weight:109.3  kg  Prescription refilled

## 2022-10-23 DIAGNOSIS — I10 Essential (primary) hypertension: Secondary | ICD-10-CM | POA: Diagnosis not present

## 2022-10-23 DIAGNOSIS — G4733 Obstructive sleep apnea (adult) (pediatric): Secondary | ICD-10-CM | POA: Diagnosis not present

## 2022-10-28 ENCOUNTER — Encounter: Payer: Self-pay | Admitting: Cardiovascular Disease

## 2022-10-29 ENCOUNTER — Encounter: Payer: Self-pay | Admitting: Family Medicine

## 2022-10-29 ENCOUNTER — Ambulatory Visit (INDEPENDENT_AMBULATORY_CARE_PROVIDER_SITE_OTHER): Payer: Medicare Other | Admitting: Family Medicine

## 2022-10-29 ENCOUNTER — Telehealth: Payer: Self-pay | Admitting: Cardiovascular Disease

## 2022-10-29 VITALS — BP 158/80 | HR 64 | Temp 97.9°F | Resp 18 | Ht 65.0 in | Wt 244.2 lb

## 2022-10-29 DIAGNOSIS — I10 Essential (primary) hypertension: Secondary | ICD-10-CM | POA: Diagnosis not present

## 2022-10-29 DIAGNOSIS — Z6841 Body Mass Index (BMI) 40.0 and over, adult: Secondary | ICD-10-CM

## 2022-10-29 DIAGNOSIS — R7303 Prediabetes: Secondary | ICD-10-CM

## 2022-10-29 DIAGNOSIS — R635 Abnormal weight gain: Secondary | ICD-10-CM

## 2022-10-29 MED ORDER — AMLODIPINE BESYLATE 10 MG PO TABS: 10.0000 mg | ORAL_TABLET | Freq: Every day | ORAL | 1 refills | Status: DC

## 2022-10-29 MED ORDER — SEMAGLUTIDE(0.25 OR 0.5MG/DOS) 2 MG/3ML ~~LOC~~ SOPN
0.2500 mg | PEN_INJECTOR | SUBCUTANEOUS | 5 refills | Status: DC
Start: 2022-10-29 — End: 2022-11-20

## 2022-10-29 NOTE — Telephone Encounter (Signed)
Patient calling the office for samples of medication:   1.  What medication and dosage are you requesting samples for? ELIQUIS 5 MG TABS tablet   2.  Are you currently out of this medication? Yes    

## 2022-10-29 NOTE — Progress Notes (Signed)
Established Patient Office Visit  Subjective   Patient ID: [name], female    DOB: [birth year]  Age: 67 y.o. MRN: [id number]  Chief Complaint  Patient presents with   Hypertension    Patient is here to discuss her BP issues , she would also like to discuss weight management     Hypertension  Weight gain Pt does have OSA but unable to get her CPAP. She is here for weight management. She would like an aide to help. She does walk 3 times a week. She only drinks water. She doesn't drink soda.  She eats 1 meal a day.   Hypertension Pt is on Amlodipine 5mg  and Metoprolol 25mg  BID. She is noticing her blood pressure not at goal. She denies chest pains or SOB.   Review of Systems  Constitutional:        Weight gain  All other systems reviewed and are negative.    Objective:     BP (!) 158/80   Pulse 64   Temp 97.9 F (36.6 C) (Oral)   Resp 18   Ht 5\' 5"  (1.651 m)   Wt 244 lb 3.2 oz (110.8 kg)   SpO2 97%   BMI 40.64 kg/m  BP Readings from Last 3 Encounters:  10/29/22 (!) 158/80  09/26/22 137/79  09/19/22 138/80      Physical Exam Vitals and nursing note reviewed.  Constitutional:      Appearance: Normal appearance. She is normal weight.  HENT:     Head: Normocephalic and atraumatic.     Right Ear: External ear normal.     Left Ear: External ear normal.     Nose: Nose normal.     Mouth/Throat:     Mouth: Mucous membranes are moist.     Pharynx: Oropharynx is clear.  Eyes:     Conjunctiva/sclera: Conjunctivae normal.     Pupils: Pupils are equal, round, and reactive to light.  Cardiovascular:     Rate and Rhythm: Normal rate.  Pulmonary:     Effort: Pulmonary effort is normal.  Abdominal:     General: Abdomen is flat. Bowel sounds are normal.  Skin:    General: Skin is warm.     Capillary Refill: Capillary refill takes less than 2 seconds.  Neurological:     General: No focal deficit present.     Mental Status: She is alert and oriented to  person, place, and time. Mental status is at baseline.  Psychiatric:        Mood and Affect: Mood normal.        Behavior: Behavior normal.        Thought Content: Thought content normal.        Judgment: Judgment normal.    No results found for any visits on 10/29/22.  Last hemoglobin A1c Lab Results  Component Value Date   HGBA1C 5.7 (H) 04/23/2022      The 10-year ASCVD risk score (Arnett DK, et al., 2019) is: 11.6%    Assessment & Plan:   Problem List Items Addressed This Visit   None  Weight gain -     Semaglutide(0.25 or 0.5MG /DOS); Inject 0.25 mg into the skin once a week.  Dispense: 3 mL; Refill: 5  Prediabetes -     Hemoglobin A1c  BMI 40.0-44.9, adult (HCC)  Essential hypertension -     amLODIPine Besylate; Take 1 tablet (10 mg total) by mouth daily.  Dispense: 90 tablet; Refill: 1  Recheck A1c  today Start Semaglutide 0/25mg  weekly Recheck in 4 weeks Diet and exercise counseling For HTN, increase Amlodipine from 5mg  to 10mg  due to uncontrolled blood pressure. Recheck in 4 weeks.   No follow-ups on file.    Suzan Slick, MD

## 2022-10-30 ENCOUNTER — Encounter: Payer: Self-pay | Admitting: Family Medicine

## 2022-10-30 LAB — HEMOGLOBIN A1C
Est. average glucose Bld gHb Est-mCnc: 120 mg/dL
Hgb A1c MFr Bld: 5.8 % — ABNORMAL HIGH (ref 4.8–5.6)

## 2022-10-30 MED ORDER — APIXABAN 5 MG PO TABS: 5.0000 mg | ORAL_TABLET | Freq: Two times a day (BID) | ORAL | 0 refills | Status: DC

## 2022-10-30 NOTE — Telephone Encounter (Signed)
We can give her 2 weeks. But she needs to be reminded to call in her refill at [phone] TWO weeks before she is going to run out since it take take 7-10 days to get her medications.

## 2022-10-30 NOTE — Telephone Encounter (Signed)
Called pt to inform her that we were leaving 2 weeks of Eliquis 5 mg tablets samples at [employer] office front desk for her to pick up. I advised the pt that if she has any other problems, questions or concerns, to give our office a call back. Pt verbalized understanding.

## 2022-10-30 NOTE — Telephone Encounter (Signed)
Called pt to inform her that we were leaving her 2 weeks of Eliquis 5 mg tablets samples at [facility] front desk for her to pick up. I advised the pt that if she has any other problems, questions or concerns, to give our office a call. Pt verbalized understanding.

## 2022-11-06 ENCOUNTER — Telehealth: Payer: Self-pay | Admitting: Family Medicine

## 2022-11-06 NOTE — Telephone Encounter (Signed)
Prescription Request  11/06/2022  LOV: 10/29/2022  What is the name of the medication or equipment?  amLODipine (NORVASC) 10 MG tablet  Semaglutide,0.25 or 0.5MG /DOS, 2 MG/3ML SOPN   Have you contacted your pharmacy to request a refill? Yes   Which pharmacy would you like this sent to?   [facility] - [place], [place] - [address] [address] [place] [address] Phone: [phone] Fax: [phone]    Patient notified that their request is being sent to the clinical staff for review and that they should receive a response within 2 business days.   Please advise at Mobile [phone] (mobile)

## 2022-11-20 ENCOUNTER — Other Ambulatory Visit: Payer: Self-pay

## 2022-11-20 DIAGNOSIS — R635 Abnormal weight gain: Secondary | ICD-10-CM

## 2022-11-20 DIAGNOSIS — I10 Essential (primary) hypertension: Secondary | ICD-10-CM

## 2022-11-20 MED ORDER — AMLODIPINE BESYLATE 10 MG PO TABS
10.0000 mg | ORAL_TABLET | Freq: Every day | ORAL | 1 refills | Status: DC
Start: 2022-11-20 — End: 2023-01-28

## 2022-11-20 MED ORDER — SEMAGLUTIDE(0.25 OR 0.5MG/DOS) 2 MG/3ML ~~LOC~~ SOPN
0.2500 mg | PEN_INJECTOR | SUBCUTANEOUS | 5 refills | Status: DC
Start: 2022-11-20 — End: 2023-01-28

## 2022-11-20 NOTE — Telephone Encounter (Signed)
Patient is requesting these medications be sent through [facility]. Her insurance will not cover these medications through [facility]. Her insurance will not pay for these medications if they go to [facility] again. [name]

## 2022-11-20 NOTE — Telephone Encounter (Signed)
Patient medications have been refilled and sent as requested to [facility].

## 2022-11-28 DIAGNOSIS — Z23 Encounter for immunization: Secondary | ICD-10-CM | POA: Diagnosis not present

## 2023-01-26 ENCOUNTER — Encounter: Payer: Self-pay | Admitting: Cardiovascular Disease

## 2023-01-26 NOTE — Progress Notes (Unsigned)
Cardiology Office Note   Date:  01/28/2023   ID:  [name], [name] [birth year], MRN [id number]  PCP:  Suzan Slick, MD  Cardiologist:   Kristeen Miss, MD   Chief Complaint  Patient presents with   Congestive Heart Failure        Atrial Fibrillation   Problem List 1. Atrial fib   - This patients CHA2DS2-VASc Score and unadjusted Ischemic Stroke Rate (% per year) is equal to 2.2 % stroke rate/year from a score of 2  Above score calculated as 1 point each if present [CHF, HTN, DM, Vascular=MI/PAD/Aortic Plaque, Age if 65-74, or Female] Above score calculated as 2 points each if present [Age > 75, or Stroke/TIA/TE]  2. Essential HTN 3. Grade 2 diastolic dysfunction  - normal LV function 4. Obstructive sleep apnea -   [name] is a 68 y.o. female who presents for atrial fib, She was seen in the ER with PAF Has had a dull head ache.   Echo shows normal LV systolic function Myoview shows no ischemia   July 20, 2015:  [name] is seen today for follow up visit for her paroxysmal atrial fib.  Has had 1 episode of palpitations - likely was PAF - related to stress.  Is watching the salt in her diet .  The Xarelto seems to be upsetting her stomach  Held it for several days and her stomach started bothering her again  March 06, 2016:  Doing well  Having some back pain  Had an episode of atrial fib 3 nights ago.   Work her up . Has OAS - uses CPAP but she had pulled it off. She put the CPAP mask back on   09/15/2016: [name] is seen back for follow-up evaluation. Wt is 228 ( down 9 lbs from last visit )  Has been having more palpitations.  Took some extra metoprolol which seems    Has not been using her CPAP because her mask has lots of condensation and drains back into there mask .  Has been tried on Lisinopril ( caused CP and cough) and was on Valsartan ( was recalled )  Doing well on the [facility]   March 18, 2017:   [name] is seen back today for  follow-up of her chronic diastolic congestive heart failure, paroxysmal atrial fibrillation Has had difficult losing weight Has night sweats - possbily due to menapause  BP is a little elevated.   Under lots of stress  - sister is in the hospital ( heavy smoker)  No dyspnea. Still has occasional palpitations - took an extra propranolol which helps   June 17, 2017:  [name] is seen back for an early work in visit Was seen by Dr. [place] a month ago.  She was having episodes of chest pain and an elevated blood pressure. She had been seen in the emergency room complaining of chest pain and left arm pain.  Troponin levels were normal. Dr. Elvis Coil opinion was that the chest pain was related to her elevated blood pressure.  She has had a normal Myoview study 2 years ago.  The plan is to repeat the Myoview study if she has recurrent symptoms.  BP is still  Moderately elevated  She has cut back on her salt.    Has seen a nutritionist.      August 03, 2017: [name] is seen today for follow-up of her hypertension and chronic diastolic congestive heart failure. BP readings have been elevated.  Has been to the [facility]   she had some Hibiscus Tea yesterday  -  BP is normal today   Sept. 5, 2019: [name] is seen today for follow-up of her hypertension and chronic diastolic congestive heart failure.  She was recently seen in the emergency room for paroxysmal atrial fibrillation.  She has a history of obstructive sleep apnea but has not been wearing her CPAP mask.  Episode of A flutter  - woke her up at 5 am.   Sent to the ER  Aflutter resolved with IV cardiazem   February 01, 2018: [name] was seen in the emergency room in December, 2019.   Th[facility]  . Pressure is been well controlled. Received lots of meds , steroids. Is just now getting better .  Is wearing her CPAP  BP is well  controlled.  Has gained some weight Wt today is 231 lbs    ( weight in Sept. 2019 was 225)   [facility] .   Sept.  28, 2020   [name] is seen today for follow up of her PAF and HTN  Wt.239 lbs ( up 8 lbs since last year )  Was going to a trainer  But has gained wt with covid .  No dyspnea.  No cp,     Sept. 13, 2021 [name] is seen for follow up of her PAF, HTN Wt = 220 ( down 19 lbs)    - was 239 last year Has developed an injury of her left hip   Has had her covid vaccines ( 2)   Her left hip pain is causing her to not sleep well at night . Has had more episodes of Atib.  Uses her CPAP    Jan. 25, 2022 [name] is seen for pre op visit prior to hip replacement  Wt is 231 ( up 11 lbs )   Has occasional palpitations   July 30, 2020: [name] is seen for follow up of her PAF, HTN, chronic diastolic CHF, obesity  Had hip replacement several months ago Wt is 228  lbs ( down 3 lbs from Jan )   Is working on weight loss ,   eats popcorn every night - we discussed this in great detail.   Has had some palpitations  Drinks    Sept. 2, 2022: [name] is seen as a work in visit for increased palpitations She thinks she is back  in atrial fib  ECG today shows NSR  Palps would last a few seconds.  Would resolve,  then start back up Has been under stress. Not sleeping well Taking care of her older sister ( in a memory center )  We discussed setting boundaries with regard to visiting and caring for her sister.   Nov. 15, 2023  [name] is seen today for follow-up of her hypertension.  Blood pressure is fairly normal today.  Her diastolic pressure is mildly elevated. Wt is 225 pounds ( down 3 lbs from last year )   Has been having some dyspnea  Had caught a cold ,  bad cough, Was given prednisone, inhaler .  Was not COVID, no RSV  Is just now feeling better   Continues to have occasional episodes of PAF  Takes a propranolol which  helps convert her back to normal .   She wonders if  she has an inherited cardiomyopathy  Jan. 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 unabale to pass ETT with Hyacinth Meeker 2; unable to visualize cord with MAC 3; intubating LMA used; uneventful intubation using Glidescope 2016, 2022   Diverticulosis, with history of diverticulitis of descending and sigmoid colon 03/03/2007   Dysrhythmia    atrial fib   Edema of both lower legs    Essential hypertension 06/25/2006   Qualifier: Diagnosis of  By: Briscoe Burns CMA, Alvy Beal     Fatty liver    Gastritis    GERD 03/03/2007   Qualifier: Diagnosis of  By: Koleen Distance CMA (AAMA), Hulan Saas   Qualifier: Diagnosis of  By: Myrtie Hawk, [name]  pt denies at preop of 02/15/20    Hepatic steatosis, found on CT 03/03/2007       Hiatal hernia, small 03/28/2016   [facility] 12/09/12.    High blood pressure    Hip pain    History of colonic polyps 04/21/2008   Last Colonoscopy was 12/09/12, Dr. [name]. No polyp at that time. Repeat in 5 years.    Internal hemorrhoids 03/03/2007   Last Colonoscopy was 12/09/12, Dr. [name]. Repeat in 5 years.    Joint pain    Lactose intolerance    Left adrenal mass (HCC) 05/12/2013   Menopausal vasomotor syndrome 03/28/2016   Morbid obesity (HCC) (BMI 35 plus 2 comorbidities)  03/06/2016   Comorbid conditions include HTN, HLD, hepatic steatosis, OA, GERD, OSA.   OSA (obstructive sleep apnea) 03/03/2007   cpap    PAF (paroxysmal atrial fibrillation) (HCC) 04/24/2015   Palpitations    PONV (postoperative nausea and vomiting)    area on left forearm DO NOT PLACE IV OR STICK ? vascular"will bleed"   Umbilical hernia without obstruction or gangrene, fat containing 03/28/2016   Found on CT, 2017. No issues.    Past Surgical History:  Procedure Laterality Date   ABDOMINAL HYSTERECTOMY     APPENDECTOMY     BREAST REDUCTION SURGERY     BUNIONECTOMY     bilateral   CERVICAL FUSION     CHOLECYSTECTOMY N/A 10/01/2016   Procedure: LAPAROSCOPIC CHOLECYSTECTOMY;  Surgeon: Abigail Miyamoto, MD;  Location: [facility] OR;  Service: General;  Laterality: N/A;   COLONOSCOPY WITH PROPOFOL N/A 05/12/2019   Procedure: COLONOSCOPY WITH PROPOFOL;  Surgeon: Charna Elizabeth, MD;  Location: [facility] ENDOSCOPY;  Service: Endoscopy;  Laterality: N/A;   ESOPHAGOGASTRODUODENOSCOPY (EGD) WITH PROPOFOL N/A 09/20/2021   Procedure: ESOPHAGOGASTRODUODENOSCOPY (EGD) WITH PROPOFOL;  Surgeon: Jeani Hawking, MD;  Location: [facility] END[facility];  Service: Gastroenterology;  Laterality: N/A;   MAXIMUM ACCESS (MAS)POSTERIOR LUMBAR INTERBODY FUSION (PLIF) 2 LEVEL N/A 09/27/2014   Procedure: Maximum Access Surgery Posterior Lumbar Interbody Fusion Lumbar three-four,  Lumbar four-five;  Surgeon: Tia Alert, MD;  Location: [facility] NEURO ORS;  Service: Neurosurgery;  Laterality: N/A;   POLYPECTOMY  05/12/2019   Procedure: POLYPECTOMY;  Surgeon: Charna Elizabeth, MD;  Location: [facility];  Service: Endoscopy;;   SPINE SURGERY     TOTAL ABDOMINAL HYSTERECTOMY W/ BILATERAL SALPINGOOPHORECTOMY     TOTAL HIP ARTHROPLASTY Left 02/21/2020   Procedure: TOTAL HIP ARTHROPLASTY ANTERIOR APPROACH;  Surgeon: Durene Romans, MD;  Location: [facility] ORS;  Service: Orthopedics;  Laterality: Left;  70 mins   TOTAL HIP ARTHROPLASTY Right 05/14/2021   Procedure:  TOTAL HIP ARTHROPLASTY ANTERIOR APPROACH;  Surgeon: Durene Romans, MD;  Location: [facility] ORS;  Service: Orthopedics;  Laterality: Right;     Current Outpatient Medications  Medication Sig Dispense Refill   albuterol (VENTOLIN HFA) 108 (90 Base) MCG/ACT inhaler Inhale 1-2 puffs into the lungs every 6 (six) hours as needed for wheezing or shortness of breath. 18 g 0   amLODipine (NORVASC) 10 MG tablet Take 1 tablet (10 mg total) by mouth daily. 90 tablet 1   apixaban (ELIQUIS) 5 MG TABS tablet Take 1 tablet (5 mg total) by mouth 2 (two) times daily. 28 tablet 0   Calcium Carb-Cholecalciferol (CALCIUM 600+D3 PO) Take 1 tablet by mouth in the morning.     metoprolol succinate (TOPROL XL) 25 MG 24 hr tablet Take 1 tablet (25 mg total) by mouth 2 (two) times daily. 180 tablet 2   Multiple Vitamin (MULTIVITAMIN WITH MINERALS) TABS tablet Take 1 tablet by mouth in the morning.     Probiotic Product (ULTRAFLORA IMMUNE HEALTH PO) Take 1 tablet by mouth daily.     propranolol (INDERAL) 10 MG tablet Take 1 tablet (10 mg total) by mouth 4 (four) times daily as needed. 360 tablet 2   TURMERIC PO Take 1 capsule by mouth in the morning.     No current facility-administered medications for this visit.    Allergies:   Other, Losartan, and Tramadol    Social History:  The patient  reports that she has never smoked. She has never used smokeless tobacco. She reports that she does not drink alcohol and does not use drugs.   Family History:  The patient's family history includes Alcoholism in her father; Coronary artery disease in her sister; Depression in her mother; Diabetes in her mother; Eating disorder in her mother; Heart disease in her mother; Hypertension in her mother and sister; Obesity in her mother; Sleep apnea in her mother.    ROS:  Please see the history of present illness.    Physical Exam: Blood pressure 128/70, pulse 70, height 5\' 5"  (1.651 m), weight 242 lb 9.6 oz (110 kg), SpO2 95%.        GEN:  moderately obese , middle age female.  in no acute distress HEENT: Normal NECK: No JVD; No carotid bruits LYMPHATICS: No lymphadenopathy CARDIAC: RRR  , soft systolic murmur  RESPIRATORY:  Clear to auscultation without rales, wheezing or rhonchi  ABDOMEN: Soft, non-tender, non-distended MUSCULOSKELETAL:  No edema; No deformity  SKIN: Warm and dry NEUROLOGIC:  Alert and oriented x 3  EKG:     EKG Interpretation Date/Time:  Wednesday January 28 2023 10:29:52 EST Ventricular Rate:  71 PR Interval:  160 QRS Duration:  82 QT Interval:  388 QTC Calculation: 421 R Axis:   26  Text Interpretation: Normal sinus rhythm with sinus arrhythmia Nonspecific T wave abnormality When compared with ECG of 05-Jan-2022 14:55, No significant change since last  tracing Confirmed by Kristeen Miss [phone]) on 01/28/2023 10:39:30 AM      Recent Labs: 04/23/2022: ALT 19; BUN 11; Creatinine, Ser 0.78; Hemoglobin 14.9; Platelets 324; Potassium 4.4; Sodium 140 06/23/2022: TSH 1.820    Lipid Panel    Component Value Date/Time   CHOL 147 04/23/2022 1039   TRIG 96 04/23/2022 1039   HDL 50 04/23/2022 1039   CHOLHDL 2.9 04/23/2022 1039   CHOLHDL 3 04/22/2016 1234   VLDL 20.6 04/22/2016 1234   LDLCALC 79 04/23/2022 1039      Wt Readings from Last 3 Encounters:  01/28/23 242 lb 9.6 oz (110 kg)  10/29/22 244 lb 3.2 oz (110.8 kg)  09/26/22 241 lb (109.3 kg)      Other studies Reviewed: Additional studies/ records that were reviewed today include: . Review of the above records demonstrates:    ASSESSMENT AND PLAN:  1.  Paroxysmal atrial fibrillation:     No palpitations.      2.   Essential hypertension:     BP is well controlled.   Cont current meds    3.    Chronic diastolic congestive heart failure:      Stable .  She is watching her salt      4.  Fatigue / weight gain :   she is having difficulty losing weight .   Will check TSH, HBA1C Needs continued carb / calorie  reduction ,  more exercise  She has been to the [facility] weight loss clinic .   Has not worked well.   5.  Obesity:     I have sent a message to our pharm D clinic to see if she would qualify for [facility].  HBA1C from Oct. 2024 was 5.8.     Current medicines are reviewed at length with the patient today.  The patient does not have concerns regarding medicines.  Labs/ tests ordered today include:   Orders Placed This Encounter  Procedures   EKG 12-Lead     Disposition:    1 year with Dr. Duke Salvia .      Kristeen Miss, MD  01/28/2023 10:46 AM    [facility] [address] [place], [place], [place]  [address] Phone: [phone]; Fax: [phone]

## 2023-01-28 ENCOUNTER — Ambulatory Visit: Payer: Self-pay | Admitting: Family Medicine

## 2023-01-28 ENCOUNTER — Other Ambulatory Visit: Payer: Self-pay | Admitting: Family Medicine

## 2023-01-28 ENCOUNTER — Encounter: Payer: Self-pay | Admitting: Cardiovascular Disease

## 2023-01-28 ENCOUNTER — Telehealth: Payer: Self-pay | Admitting: Cardiovascular Disease

## 2023-01-28 ENCOUNTER — Ambulatory Visit: Payer: Managed Care, Other (non HMO) | Attending: Cardiovascular Disease | Admitting: Cardiovascular Disease

## 2023-01-28 ENCOUNTER — Telehealth: Payer: Self-pay

## 2023-01-28 ENCOUNTER — Other Ambulatory Visit: Payer: Self-pay | Admitting: Cardiovascular Disease

## 2023-01-28 VITALS — BP 128/70 | HR 70 | Ht 65.0 in | Wt 242.6 lb

## 2023-01-28 DIAGNOSIS — I10 Essential (primary) hypertension: Secondary | ICD-10-CM | POA: Diagnosis not present

## 2023-01-28 DIAGNOSIS — R0602 Shortness of breath: Secondary | ICD-10-CM | POA: Diagnosis not present

## 2023-01-28 DIAGNOSIS — R635 Abnormal weight gain: Secondary | ICD-10-CM

## 2023-01-28 DIAGNOSIS — Z13220 Encounter for screening for lipoid disorders: Secondary | ICD-10-CM | POA: Diagnosis not present

## 2023-01-28 DIAGNOSIS — I480 Paroxysmal atrial fibrillation: Secondary | ICD-10-CM | POA: Diagnosis not present

## 2023-01-28 MED ORDER — METOPROLOL SUCCINATE ER 25 MG PO TB24: 25.0000 mg | ORAL_TABLET | Freq: Two times a day (BID) | ORAL | 2 refills | Status: DC

## 2023-01-28 MED ORDER — AMLODIPINE BESYLATE 10 MG PO TABS: 10.0000 mg | ORAL_TABLET | Freq: Every day | ORAL | 1 refills | Status: DC

## 2023-01-28 MED ORDER — APIXABAN 5 MG PO TABS: 5.0000 mg | ORAL_TABLET | Freq: Two times a day (BID) | ORAL | 3 refills | Status: DC

## 2023-01-28 NOTE — Telephone Encounter (Signed)
Please refer to eliquis refill encounter on today. This is a duplicate request.

## 2023-01-28 NOTE — Telephone Encounter (Signed)
Copied from CRM [id number]. Topic: Clinical - Medication Question >> Jan 28, 2023  4:03 PM Mosetta Putt H wrote: Reason for CRM: Went to cardiologist and they want patient to get a increase in dosage because of weight going to go ahead and send refill and increase dosage.

## 2023-01-28 NOTE — Telephone Encounter (Signed)
Patient stated that she had called earlier about her insurance and making sure it was correct in our system. Agent team was able to help her. Patient verified that she wasn't needing any assistance from Nurse triage at this time. Patient to be seen tomorrow in the office for her regular visit.  Reason for Disposition  Health Information question, no triage required and triager able to answer question  Answer Assessment - Initial Assessment Questions 1. REASON FOR CALL or QUESTION: "What is your reason for calling today?" or "How can I best help you?" or "What question do you have that I can help answer?"     Patient was placed in triage queue without any information. Patient called back to verify if she needed any assistance from nurse triage.  Protocols used: Information Only Call - No Triage-A-AH

## 2023-01-28 NOTE — Telephone Encounter (Signed)
Copied from CRM [id number]. Topic: Clinical - Medication Refill >> Jan 28, 2023  4:08 PM Adelina Mings wrote: Most Recent Primary Care Visit:  Provider: Suzan Slick  Department: [facility]  Visit Type: OFFICE VISIT  Date: 10/29/2022  Medication: Ozempic   Has the patient contacted their pharmacy? No (Agent: If no, request that the patient contact the pharmacy for the refill. If patient does not wish to contact the pharmacy document the reason why and proceed with request.) (Agent: If yes, when and what did the pharmacy advise?)No refills   Is this the correct pharmacy for this prescription? Yes If no, delete pharmacy and type the correct one.  This is the patient's preferred pharmacy: [phone] - [place], [place] - [address] [place] [address] Phone: [phone] Fax: [phone]   Has the prescription been filled recently? No  Is the patient out of the medication? Yes  Has the patient been seen for an appointment in the last year OR does the patient have an upcoming appointment? Yes  Can we respond through MyChart? Yes  Agent: Please be advised that Rx refills may take up to 3 business days. We ask that you follow-up with your pharmacy.

## 2023-01-28 NOTE — Telephone Encounter (Signed)
Eliquis 5mg  refill request received. Patient is 68 years old, weight-110kg, Crea- 0.78 on 04/23/22, Diagnosis-Afib, and last seen by Dr. Elease Hashimoto on 01/28/23. Dose is appropriate based on dosing criteria. Will send in refill to requested pharmacy.

## 2023-01-28 NOTE — Patient Instructions (Signed)
Lab Work: TSH, Lipids, ALT, BMET, CBC, A1C If you have labs (blood work) drawn today and your tests are completely normal, you will receive your results only by: MyChart Message (if you have MyChart) OR A paper copy in the mail If you have any lab test that is abnormal or we need to change your treatment, we will call you to review the results.  Follow-Up: At [facility], you and your health needs are our priority.  As part of our continuing mission to provide you with exceptional heart care, we have created designated Provider Care Teams.  These Care Teams include your primary Cardiologist (physician) and Advanced Practice Providers (APPs -  Physician Assistants and Nurse Practitioners) who all work together to provide you with the care you need, when you need it.  Your next appointment:   1 year(s)  Provider:   Chilton Si, MD

## 2023-01-28 NOTE — Telephone Encounter (Signed)
*  STAT* If patient is at the pharmacy, call can be transferred to refill team.   1. Which medications need to be refilled? (please list name of each medication and dose if known)   apixaban (ELIQUIS) 5 MG TABS tablet    4. Which pharmacy/location (including street and city if local pharmacy) is medication to be sent to?   [id number] [place], [place] - [address] Phone: [phone]  Fax: [phone]       5. Do they need a 30 day or 90 day supply? 90

## 2023-01-29 ENCOUNTER — Encounter: Payer: Self-pay | Admitting: Cardiovascular Disease

## 2023-01-29 ENCOUNTER — Encounter: Payer: Self-pay | Admitting: Family Medicine

## 2023-01-29 ENCOUNTER — Ambulatory Visit (INDEPENDENT_AMBULATORY_CARE_PROVIDER_SITE_OTHER): Payer: Managed Care, Other (non HMO) | Admitting: Family Medicine

## 2023-01-29 VITALS — BP 147/85 | HR 68 | Temp 97.7°F | Resp 18 | Ht 65.0 in | Wt 242.9 lb

## 2023-01-29 DIAGNOSIS — I10 Essential (primary) hypertension: Secondary | ICD-10-CM

## 2023-01-29 DIAGNOSIS — Z6841 Body Mass Index (BMI) 40.0 and over, adult: Secondary | ICD-10-CM

## 2023-01-29 LAB — HEMOGLOBIN A1C
Est. average glucose Bld gHb Est-mCnc: 117 mg/dL
Hgb A1c MFr Bld: 5.7 % — ABNORMAL HIGH (ref 4.8–5.6)

## 2023-01-29 LAB — BASIC METABOLIC PANEL
BUN/Creatinine Ratio: 15 (ref 12–28)
BUN: 12 mg/dL (ref 8–27)
CO2: 21 mmol/L (ref 20–29)
Calcium: 10.1 mg/dL (ref 8.7–10.3)
Chloride: 104 mmol/L (ref 96–106)
Creatinine, Ser: 0.82 mg/dL (ref 0.57–1.00)
Glucose: 87 mg/dL (ref 70–99)
Potassium: 4.5 mmol/L (ref 3.5–5.2)
Sodium: 142 mmol/L (ref 134–144)
eGFR: 79 mL/min/{1.73_m2} (ref >59)

## 2023-01-29 LAB — CBC
Hematocrit: 43.1 % (ref 34.0–46.6)
Hemoglobin: 14.4 g/dL (ref 11.1–15.9)
MCH: 32.8 pg (ref 26.6–33.0)
MCHC: 33.4 g/dL (ref 31.5–35.7)
MCV: 98 fL — ABNORMAL HIGH (ref 79–97)
Platelets: 373 10*3/uL (ref 150–450)
RBC: 4.39 x10E6/uL (ref 3.77–5.28)
RDW: 12.6 % (ref 11.7–15.4)
WBC: 5.4 10*3/uL (ref 3.4–10.8)

## 2023-01-29 LAB — ALT: ALT: 19 [IU]/L (ref 0–32)

## 2023-01-29 LAB — LIPID PANEL
Chol/HDL Ratio: 3 {ratio} (ref 0.0–4.4)
Cholesterol, Total: 129 mg/dL (ref 100–199)
HDL: 43 mg/dL (ref >39)
LDL Chol Calc (NIH): 63 mg/dL (ref 0–99)
Triglycerides: 128 mg/dL (ref 0–149)
VLDL Cholesterol Cal: 23 mg/dL (ref 5–40)

## 2023-01-29 LAB — TSH: TSH: 0.838 u[IU]/mL (ref 0.450–4.500)

## 2023-01-29 MED ORDER — OZEMPIC (0.25 OR 0.5 MG/DOSE) 2 MG/1.5ML ~~LOC~~ SOPN: 0.5000 mg | PEN_INJECTOR | SUBCUTANEOUS | 1 refills | Status: DC

## 2023-01-29 NOTE — Progress Notes (Signed)
Established Patient Office Visit  Subjective   Patient ID: [name], female    DOB: 06-04-1955  Age: 68 y.o. MRN: [id number]  Chief Complaint  Patient presents with   Medical Management of Chronic Issues    Patient would like to discuss increasing her medication ozempic    HPI  Weight management Pt has been on Ozempic 0.25 mg weekly since October 2024. She was advised to follow up in 4 weeks. She never came back. She reports the 0.25mg  weekly isn't helping her weight loss. Tolerating the injection without side effects. Pt down 2 lbs since starting medicine.  Hypertension Compliant with medications. Bp a little high today but much better yesterday at cardiologist's office.   Review of Systems  All other systems reviewed and are negative.     Objective:     BP (!) 147/85   Pulse 68   Temp 97.7 F (36.5 C) (Oral)   Resp 18   Ht 5\' 5"  (1.651 m)   Wt 242 lb 14.4 oz (110.2 kg)   SpO2 99%   BMI 40.42 kg/m  BP Readings from Last 3 Encounters:  01/29/23 (!) 147/85  01/28/23 128/70  10/29/22 (!) 158/80      Physical Exam Vitals and nursing note reviewed.  Constitutional:      Appearance: Normal appearance. She is obese.  HENT:     Head: Normocephalic and atraumatic.     Right Ear: External ear normal.     Left Ear: External ear normal.     Nose: Nose normal.     Mouth/Throat:     Mouth: Mucous membranes are moist.     Pharynx: Oropharynx is clear.  Eyes:     Conjunctiva/sclera: Conjunctivae normal.     Pupils: Pupils are equal, round, and reactive to light.  Cardiovascular:     Rate and Rhythm: Normal rate.  Pulmonary:     Effort: Pulmonary effort is normal.  Skin:    General: Skin is warm.     Capillary Refill: Capillary refill takes less than 2 seconds.  Neurological:     General: No focal deficit present.     Mental Status: She is alert and oriented to person, place, and time. Mental status is at baseline.  Psychiatric:        Mood and Affect:  Mood normal.        Behavior: Behavior normal.        Thought Content: Thought content normal.        Judgment: Judgment normal.     No results found for any visits on 01/29/23.     The ASCVD Risk score (Arnett DK, et al., 2019) failed to calculate for the following reasons:   The valid total cholesterol range is 130 to 320 mg/dL    Assessment & Plan:   Problem List Items Addressed This Visit       Cardiovascular and Mediastinum   Essential hypertension - Primary   Other Visit Diagnoses       BMI 40.0-44.9, adult (HCC)       Relevant Medications   Semaglutide,0.25 or 0.5MG /DOS, (OZEMPIC, 0.25 OR 0.5 MG/DOSE,) 2 MG/1.5ML SOPN      Essential hypertension  BMI 40.0-44.9, adult (HCC) -     Ozempic (0.25 or 0.5 MG/DOSE); Inject 0.5 mg into the skin once a week.  Dispense: 1.5 mL; Refill: 1   To increase Ozempic from 0.25 mg weekly to 0.5mg  weekly. See back in 4 weeks. To continue monitoring  blood pressure and continue with HTN regimen.  Return in about 4 weeks (around 02/26/2023) for Weight Management.    Suzan Slick, MD

## 2023-02-05 DIAGNOSIS — Z7901 Long term (current) use of anticoagulants: Secondary | ICD-10-CM | POA: Diagnosis not present

## 2023-02-05 DIAGNOSIS — R059 Cough, unspecified: Secondary | ICD-10-CM | POA: Diagnosis not present

## 2023-02-05 DIAGNOSIS — I509 Heart failure, unspecified: Secondary | ICD-10-CM | POA: Diagnosis not present

## 2023-02-05 DIAGNOSIS — I110 Hypertensive heart disease with heart failure: Secondary | ICD-10-CM | POA: Diagnosis not present

## 2023-02-05 DIAGNOSIS — J069 Acute upper respiratory infection, unspecified: Secondary | ICD-10-CM | POA: Diagnosis not present

## 2023-02-06 DIAGNOSIS — J069 Acute upper respiratory infection, unspecified: Secondary | ICD-10-CM | POA: Diagnosis not present

## 2023-02-09 ENCOUNTER — Encounter: Payer: Self-pay | Admitting: Family Medicine

## 2023-02-09 ENCOUNTER — Ambulatory Visit: Payer: Self-pay | Admitting: Family Medicine

## 2023-02-09 ENCOUNTER — Ambulatory Visit (INDEPENDENT_AMBULATORY_CARE_PROVIDER_SITE_OTHER): Payer: Managed Care, Other (non HMO) | Admitting: Family Medicine

## 2023-02-09 VITALS — BP 129/70 | HR 84 | Temp 98.7°F | Resp 18 | Ht 65.0 in | Wt 237.4 lb

## 2023-02-09 DIAGNOSIS — J40 Bronchitis, not specified as acute or chronic: Secondary | ICD-10-CM | POA: Diagnosis not present

## 2023-02-09 MED ORDER — BENZONATATE 100 MG PO CAPS: 100.0000 mg | ORAL_CAPSULE | Freq: Two times a day (BID) | ORAL | 0 refills | Status: DC | PRN

## 2023-02-09 MED ORDER — ALBUTEROL SULFATE HFA 108 (90 BASE) MCG/ACT IN AERS
2.0000 | INHALATION_SPRAY | Freq: Four times a day (QID) | RESPIRATORY_TRACT | 0 refills | Status: AC | PRN
Start: 2023-02-09 — End: ?

## 2023-02-09 MED ORDER — PREDNISONE 20 MG PO TABS
20.0000 mg | ORAL_TABLET | Freq: Every day | ORAL | 0 refills | Status: AC
Start: 2023-02-09 — End: 2023-02-14

## 2023-02-09 NOTE — Telephone Encounter (Signed)
  Chief Complaint: Worsening cough Symptoms: cough, runny nose, SOB Frequency: 5 days Pertinent Negatives: Patient denies CP, fever Disposition: [] ED /[] Urgent Care (no appt availability in office) / [x] Appointment(In office/virtual)/ []  [facility]/ [] Home Care/ [] Refused Recommended Disposition /[] [facility]/ []  Follow-up with PCP Additional Notes: Patient called reporting worsening symptoms for 5 days. States she was evaluated and negative for covid and flu, but her symptoms are not improving and no OTC medications have been effective. Patient reluctant to answer triage questions, states she just wants to make an appt. Per protocol, patient to be evaluated within 4 hours. Scheduled with first available provider for today at 0930. Care advice reviewed, patient verbalized understanding. Alerting PCP for review.   Copied from CRM [id number]. Topic: Clinical - Red Word Triage >> Feb 09, 2023  8:08 AM Dimitri Ped wrote: Red Word that prompted transfer to Nurse Triage: patient went to hospital last Thursday for flu symptoms . Patient is still feeling bad can't breath. c Reason for Disposition  [1] MILD difficulty breathing (e.g., minimal/no SOB at rest, SOB with walking, pulse <100) AND [2] NEW-onset or WORSE than normal  Answer Assessment - Initial Assessment Questions 1. RESPIRATORY STATUS: "Describe your breathing?" (e.g., wheezing, shortness of breath, unable to speak, severe coughing)      SOB, wheezing, coughing- al ot 2. ONSET: "When did this breathing problem begin?"      5 Days 3. PATTERN "Does the difficult breathing come and go, or has it been constant since it started?"      Constant now since being sick 4. SEVERITY: "How bad is your breathing?" (e.g., mild, moderate, severe)    - MILD: No SOB at rest, mild SOB with walking, speaks normally in sentences, can lie down, no retractions, pulse < 100.    - MODERATE: SOB at rest, SOB with minimal exertion and prefers to  sit, cannot lie down flat, speaks in phrases, mild retractions, audible wheezing, pulse 100-120.    - SEVERE: Very SOB at rest, speaks in single words, struggling to breathe, sitting hunched forward, retractions, pulse > 120      Mild, states it is just there and more than normal 5. RECURRENT SYMPTOM: "Have you had difficulty breathing before?" If Yes, ask: "When was the last time?" and "What happened that time?"      Denies 6. CARDIAC HISTORY: "Do you have any history of heart disease?" (e.g., heart attack, angina, bypass surgery, angioplasty)      Denies 7. LUNG HISTORY: "Do you have any history of lung disease?"  (e.g., pulmonary embolus, asthma, emphysema)     Denies 8. CAUSE: "What do you think is causing the breathing problem?"      Sick for the last week- OTC meds with no relief. Was given cough syrup and it did not help. 9. OTHER SYMPTOMS: "Do you have any other symptoms? (e.g., dizziness, runny nose, cough, chest pain, fever)     Cough, runny nose 10. O2 SATURATION MONITOR:  "Do you use an oxygen saturation monitor (pulse oximeter) at home?" If Yes, ask: "What is your reading (oxygen level) today?" "What is your usual oxygen saturation reading?" (e.g., 95%)       Unable to monitor at home.  12. TRAVEL: "Have you traveled out of the country in the last month?" (e.g., travel history, exposures)       Denies  Protocols used: Breathing Difficulty-A-AH

## 2023-02-09 NOTE — Progress Notes (Signed)
Acute Office Visit  Subjective:     Patient ID: [name], female    DOB: [birth year], 67 y.o.   MRN: [id number]  Chief Complaint  Patient presents with   URI    Patient states that she has had cold like symptoms since Thursday she states that she has had shortness of breath,fatigue, nasal congestion, productive cough, no appetite .     URI  Associated symptoms include coughing.  Patient is in today for acute visit.  Pt has had nasal congestion since Thursday with cough. She has repetitive cough worse at night. She reports she can't stop coughing at night. She also has fatigue and SOB. She went to ER on 1/30 and was negative for flu, RSV, and Covid. Xray was normal. She was advised her symptoms are viral URI and to take OTC medicine. She reports she's been doing so but this isn't helping. She also reports decrease appetite.  Review of Systems  Constitutional:  Positive for malaise/fatigue.  Respiratory:  Positive for cough and shortness of breath.   All other systems reviewed and are negative.      Objective:    BP 129/70   Pulse 84   Temp 98.7 F (37.1 C) (Oral)   Resp 18   Ht 5\' 5"  (1.651 m)   Wt 237 lb 6.4 oz (107.7 kg)   SpO2 96%   BMI 39.51 kg/m    Physical Exam Vitals and nursing note reviewed.  Constitutional:      General: She is not in acute distress.    Appearance: Normal appearance. She is normal weight. She is not toxic-appearing.  HENT:     Head: Normocephalic and atraumatic.     Right Ear: Tympanic membrane, ear canal and external ear normal.     Left Ear: Tympanic membrane, ear canal and external ear normal.     Nose: Nose normal.     Mouth/Throat:     Mouth: Mucous membranes are moist.     Pharynx: Oropharynx is clear.  Eyes:     Conjunctiva/sclera: Conjunctivae normal.     Pupils: Pupils are equal, round, and reactive to light.  Cardiovascular:     Rate and Rhythm: Normal rate and regular rhythm.     Pulses: Normal pulses.     Heart  sounds: Normal heart sounds.  Pulmonary:     Effort: Pulmonary effort is normal. No respiratory distress.     Breath sounds: Normal breath sounds. No wheezing.  Skin:    General: Skin is warm.     Capillary Refill: Capillary refill takes less than 2 seconds.  Neurological:     General: No focal deficit present.     Mental Status: She is alert and oriented to person, place, and time. Mental status is at baseline.  Psychiatric:        Mood and Affect: Mood normal.        Behavior: Behavior normal.        Thought Content: Thought content normal.        Judgment: Judgment normal.   No results found for any visits on 02/09/23.      Assessment & Plan:   Problem List Items Addressed This Visit   None  Bronchitis -     Albuterol Sulfate HFA; Inhale 2 puffs into the lungs every 6 (six) hours as needed for wheezing or shortness of breath.  Dispense: 8 g; Refill: 0 -     predniSONE; Take 1 tablet (20 mg total)  by mouth daily with breakfast for 5 days.  Dispense: 5 tablet; Refill: 0 -     Benzonatate; Take 1 capsule (100 mg total) by mouth 2 (two) times daily as needed for cough.  Dispense: 20 capsule; Refill: 0   Symptoms most consistent with viral bronchitis. Add Albuterol inhaler to use 2 puffs inhaled every 4-6 hours prn for SOB, prednisone 20mg  short taper x 5 days along with Tessalon perles 100 mg BID prn for cough. Also advised pt on using Vicks rub at night to chest along with honey/tea.  No orders of the defined types were placed in this encounter.   No follow-ups on file.  Suzan Slick, MD

## 2023-02-15 ENCOUNTER — Other Ambulatory Visit: Payer: Self-pay | Admitting: Family Medicine

## 2023-02-15 DIAGNOSIS — J40 Bronchitis, not specified as acute or chronic: Secondary | ICD-10-CM

## 2023-02-16 NOTE — Telephone Encounter (Signed)
 Copied from CRM [id number]. Topic: Clinical - Medication Question >> Feb 16, 2023  8:36 AM [name] wrote: Reason for CRM: Patient is requesting an antibiotic to called in to pharmacy for cold. Callback number is [phone]

## 2023-02-17 DIAGNOSIS — I10 Essential (primary) hypertension: Secondary | ICD-10-CM | POA: Diagnosis not present

## 2023-02-17 DIAGNOSIS — R053 Chronic cough: Secondary | ICD-10-CM | POA: Diagnosis not present

## 2023-02-26 ENCOUNTER — Ambulatory Visit: Payer: Managed Care, Other (non HMO) | Admitting: Family Medicine

## 2023-03-26 ENCOUNTER — Other Ambulatory Visit: Payer: Self-pay | Admitting: Obstetrics & Gynecology

## 2023-03-26 DIAGNOSIS — Z780 Asymptomatic menopausal state: Secondary | ICD-10-CM

## 2023-03-26 DIAGNOSIS — E2839 Other primary ovarian failure: Secondary | ICD-10-CM

## 2023-04-30 DIAGNOSIS — M25511 Pain in right shoulder: Secondary | ICD-10-CM | POA: Diagnosis not present

## 2023-05-14 ENCOUNTER — Encounter: Payer: Self-pay | Admitting: Cardiovascular Disease

## 2023-05-14 ENCOUNTER — Other Ambulatory Visit: Payer: Self-pay | Admitting: Cardiovascular Disease

## 2023-05-14 DIAGNOSIS — I480 Paroxysmal atrial fibrillation: Secondary | ICD-10-CM

## 2023-05-15 MED ORDER — METOPROLOL SUCCINATE ER 25 MG PO TB24: 25.0000 mg | ORAL_TABLET | Freq: Two times a day (BID) | ORAL | 2 refills | Status: DC

## 2023-05-15 NOTE — Telephone Encounter (Signed)
 Pt is requesting medication eliquis  to be resent to preferred pharmacy [facility]. Please address

## 2023-05-15 NOTE — Telephone Encounter (Signed)
 Eliquis  5mg  refill request received. Patient is 68 years old, weight-107.7kg, Crea-0.82 on 01/28/23, Diagnosis-Afib, and last seen by Dr. Alroy Aspen on 01/28/23. Dose is appropriate based on dosing criteria. Will send in refill to requested pharmacy.

## 2023-06-16 DIAGNOSIS — R600 Localized edema: Secondary | ICD-10-CM | POA: Diagnosis not present

## 2023-06-16 DIAGNOSIS — Z8679 Personal history of other diseases of the circulatory system: Secondary | ICD-10-CM | POA: Diagnosis not present

## 2023-06-16 DIAGNOSIS — I10 Essential (primary) hypertension: Secondary | ICD-10-CM | POA: Diagnosis not present

## 2023-06-18 DIAGNOSIS — G4733 Obstructive sleep apnea (adult) (pediatric): Secondary | ICD-10-CM | POA: Diagnosis not present

## 2023-06-30 DIAGNOSIS — I10 Essential (primary) hypertension: Secondary | ICD-10-CM | POA: Diagnosis not present

## 2023-07-15 DIAGNOSIS — I10 Essential (primary) hypertension: Secondary | ICD-10-CM | POA: Diagnosis not present

## 2023-07-29 DIAGNOSIS — M545 Low back pain, unspecified: Secondary | ICD-10-CM | POA: Diagnosis not present

## 2023-07-29 DIAGNOSIS — K5790 Diverticulosis of intestine, part unspecified, without perforation or abscess without bleeding: Secondary | ICD-10-CM | POA: Diagnosis not present

## 2023-07-29 DIAGNOSIS — K5730 Diverticulosis of large intestine without perforation or abscess without bleeding: Secondary | ICD-10-CM | POA: Diagnosis not present

## 2023-07-29 DIAGNOSIS — K5900 Constipation, unspecified: Secondary | ICD-10-CM | POA: Diagnosis not present

## 2023-07-29 DIAGNOSIS — Z9049 Acquired absence of other specified parts of digestive tract: Secondary | ICD-10-CM | POA: Diagnosis not present

## 2023-07-29 DIAGNOSIS — R1032 Left lower quadrant pain: Secondary | ICD-10-CM | POA: Diagnosis not present

## 2023-07-29 DIAGNOSIS — Z9889 Other specified postprocedural states: Secondary | ICD-10-CM | POA: Diagnosis not present

## 2023-08-05 ENCOUNTER — Other Ambulatory Visit

## 2023-08-14 DIAGNOSIS — I10 Essential (primary) hypertension: Secondary | ICD-10-CM | POA: Diagnosis not present

## 2023-08-14 DIAGNOSIS — R7303 Prediabetes: Secondary | ICD-10-CM | POA: Diagnosis not present

## 2023-08-14 DIAGNOSIS — H9319 Tinnitus, unspecified ear: Secondary | ICD-10-CM | POA: Diagnosis not present

## 2023-08-14 DIAGNOSIS — H6121 Impacted cerumen, right ear: Secondary | ICD-10-CM | POA: Diagnosis not present

## 2023-08-23 DIAGNOSIS — R059 Cough, unspecified: Secondary | ICD-10-CM | POA: Diagnosis not present

## 2023-08-23 DIAGNOSIS — R918 Other nonspecific abnormal finding of lung field: Secondary | ICD-10-CM | POA: Diagnosis not present

## 2023-08-23 DIAGNOSIS — N179 Acute kidney failure, unspecified: Secondary | ICD-10-CM | POA: Diagnosis not present

## 2023-08-23 DIAGNOSIS — I110 Hypertensive heart disease with heart failure: Secondary | ICD-10-CM | POA: Diagnosis not present

## 2023-08-23 DIAGNOSIS — Z79899 Other long term (current) drug therapy: Secondary | ICD-10-CM | POA: Diagnosis not present

## 2023-08-23 DIAGNOSIS — I4891 Unspecified atrial fibrillation: Secondary | ICD-10-CM | POA: Diagnosis not present

## 2023-08-23 DIAGNOSIS — I509 Heart failure, unspecified: Secondary | ICD-10-CM | POA: Diagnosis not present

## 2023-08-31 DIAGNOSIS — M5459 Other low back pain: Secondary | ICD-10-CM | POA: Diagnosis not present

## 2023-08-31 DIAGNOSIS — R7303 Prediabetes: Secondary | ICD-10-CM | POA: Diagnosis not present

## 2023-09-08 ENCOUNTER — Encounter: Payer: Self-pay | Admitting: Sports Medicine

## 2023-09-18 DIAGNOSIS — M549 Dorsalgia, unspecified: Secondary | ICD-10-CM | POA: Diagnosis not present

## 2023-09-18 DIAGNOSIS — I10 Essential (primary) hypertension: Secondary | ICD-10-CM | POA: Diagnosis not present

## 2023-09-18 DIAGNOSIS — I480 Paroxysmal atrial fibrillation: Secondary | ICD-10-CM | POA: Diagnosis not present

## 2023-09-22 DIAGNOSIS — M5416 Radiculopathy, lumbar region: Secondary | ICD-10-CM | POA: Diagnosis not present

## 2023-09-23 ENCOUNTER — Other Ambulatory Visit: Payer: Self-pay | Admitting: Neurological Surgery

## 2023-09-23 DIAGNOSIS — M5416 Radiculopathy, lumbar region: Secondary | ICD-10-CM

## 2023-09-28 ENCOUNTER — Ambulatory Visit
Admission: RE | Admit: 2023-09-28 | Discharge: 2023-09-28 | Disposition: A | Discharge status: Home / Self Care | Source: Ambulatory Visit | Attending: Neurological Surgery | Admitting: Neurological Surgery

## 2023-09-28 DIAGNOSIS — M5416 Radiculopathy, lumbar region: Secondary | ICD-10-CM

## 2023-09-28 DIAGNOSIS — M48061 Spinal stenosis, lumbar region without neurogenic claudication: Secondary | ICD-10-CM | POA: Diagnosis not present

## 2023-09-28 DIAGNOSIS — M4726 Other spondylosis with radiculopathy, lumbar region: Secondary | ICD-10-CM | POA: Diagnosis not present

## 2023-09-29 DIAGNOSIS — M545 Low back pain, unspecified: Secondary | ICD-10-CM | POA: Diagnosis not present

## 2023-10-06 DIAGNOSIS — M5416 Radiculopathy, lumbar region: Secondary | ICD-10-CM | POA: Diagnosis not present

## 2023-10-06 DIAGNOSIS — M4316 Spondylolisthesis, lumbar region: Secondary | ICD-10-CM | POA: Diagnosis not present

## 2023-10-07 ENCOUNTER — Ambulatory Visit

## 2023-10-07 DIAGNOSIS — E2839 Other primary ovarian failure: Secondary | ICD-10-CM | POA: Diagnosis not present

## 2023-10-07 DIAGNOSIS — Z780 Asymptomatic menopausal state: Secondary | ICD-10-CM | POA: Diagnosis not present

## 2023-10-12 ENCOUNTER — Telehealth (HOSPITAL_BASED_OUTPATIENT_CLINIC_OR_DEPARTMENT_OTHER): Payer: Self-pay | Admitting: *Deleted

## 2023-10-12 NOTE — Telephone Encounter (Signed)
   Pre-operative Risk Assessment    Patient Name: [name]  DOB: [birth year] MRN: [id number]   Date of last office visit: 01/28/23 DR. NAHSER Date of next office visit: NONE   Request for Surgical Clearance    Procedure:  L2-3 ESI  Date of Surgery:  Clearance TBD                                Surgeon:  DR. DAVE [facility] Surgeon's Group or Practice Name:  [facility] Phone number:  [phone] Fax number:  [phone]   Type of Clearance Requested:   - Medical  - Pharmacy:  Hold Apixaban  (Eliquis ) x 3 DAYS PRIOR, RESUME THE NEXT DAY POST PROCEDURE   Type of Anesthesia:  Not Indicated   Additional requests/questions:    Bonney Niels Jest   10/12/2023, 10:30 AM

## 2023-10-13 DIAGNOSIS — M545 Low back pain, unspecified: Secondary | ICD-10-CM | POA: Diagnosis not present

## 2023-10-13 NOTE — Telephone Encounter (Signed)
 Please advise holding Eliquis  prior to lumbar ESI not yet scheduled.   Thank you!  DW

## 2023-10-18 NOTE — Telephone Encounter (Signed)
 Patient with diagnosis of afib on Eliquis  for anticoagulation.    Procedure:  L2-3 ESI  Date of procedure: TBD   CHA2DS2-VASc Score = 4   This indicates a 4.8% annual risk of stroke. The patient's score is based upon: CHF History: 1 HTN History: 1 Diabetes History: 0 Stroke History: 0 Vascular Disease History: 0 Age Score: 1 Gender Score: 1      CrCl 67 ml/min Platelet count 276  Patient has not had an Afib/aflutter ablation in the last 3 months, DCCV within the last 4 weeks or a watchman implanted in the last 45 days   Per office protocol, patient can hold Eliquis  for 3 days prior to procedure.    **This guidance is not considered finalized until pre-operative APP has relayed final recommendations.**

## 2023-10-19 ENCOUNTER — Telehealth (HOSPITAL_BASED_OUTPATIENT_CLINIC_OR_DEPARTMENT_OTHER): Payer: Self-pay | Admitting: *Deleted

## 2023-10-19 NOTE — Telephone Encounter (Signed)
   Name: URA YINGLING  DOB: 02-15-1955  MRN: [id number]  Primary Cardiologist: Annabella Scarce, MD  Chart reviewed as part of pre-operative protocol coverage. Because of Syrianna Schillaci Canto's past medical history and time since last visit, she will require a follow-up telephone visit in order to better assess preoperative cardiovascular risk.  Pre-op covering staff: - Please schedule appointment and call patient to inform them. If patient already had an upcoming appointment within acceptable timeframe, please add pre-op clearance to the appointment notes so provider is aware. - Please contact requesting surgeon's office via preferred method (i.e, phone, fax) to inform them of need for appointment prior to surgery.   Per office protocol, patient can hold Eliquis  for 3 days prior to procedure.  Please resume when medically safe to do so.  Orren LOISE Fabry, PA-C  10/19/2023, 7:27 AM

## 2023-10-19 NOTE — Telephone Encounter (Signed)
 Pt has been scheduled tele preop appt 10/23/23. Dr. Eichman is waiting for clearance before scheduling procedure . Med rec and consent are done.      Patient Consent for Virtual Visit        [name] has provided verbal consent on 10/19/2023 for a virtual visit (video or telephone).   CONSENT FOR VIRTUAL VISIT FOR:  [name]  By participating in this virtual visit I agree to the following:  I hereby voluntarily request, consent and authorize [facility] and its employed or contracted physicians, physician assistants, nurse practitioners or other licensed health care professionals (the Practitioner), to provide me with telemedicine health care services (the "Services) as deemed necessary by the treating Practitioner. I acknowledge and consent to receive the Services by the Practitioner via telemedicine. I understand that the telemedicine visit will involve communicating with the Practitioner through live audiovisual communication technology and the disclosure of certain medical information by electronic transmission. I acknowledge that I have been given the opportunity to request an in-person assessment or other available alternative prior to the telemedicine visit and am voluntarily participating in the telemedicine visit.  I understand that I have the right to withhold or withdraw my consent to the use of telemedicine in the course of my care at any time, without affecting my right to future care or treatment, and that the Practitioner or I may terminate the telemedicine visit at any time. I understand that I have the right to inspect all information obtained and/or recorded in the course of the telemedicine visit and may receive copies of available information for a reasonable fee.  I understand that some of the potential risks of receiving the Services via telemedicine include:  Delay or interruption in medical evaluation due to technological equipment failure or  disruption; Information transmitted may not be sufficient (e.g. poor resolution of images) to allow for appropriate medical decision making by the Practitioner; and/or  In rare instances, security protocols could fail, causing a breach of personal health information.  Furthermore, I acknowledge that it is my responsibility to provide information about my medical history, conditions and care that is complete and accurate to the best of my ability. I acknowledge that Practitioner's advice, recommendations, and/or decision may be based on factors not within their control, such as incomplete or inaccurate data provided by me or distortions of diagnostic images or specimens that may result from electronic transmissions. I understand that the practice of medicine is not an exact science and that Practitioner makes no warranties or guarantees regarding treatment outcomes. I acknowledge that a copy of this consent can be made available to me via my patient portal [facility] MyChart), or I can request a printed copy by calling the office of [facility].    I understand that my insurance will be billed for this visit.   I have read or had this consent read to me. I understand the contents of this consent, which adequately explains the benefits and risks of the Services being provided via telemedicine.  I have been provided ample opportunity to ask questions regarding this consent and the Services and have had my questions answered to my satisfaction. I give my informed consent for the services to be provided through the use of telemedicine in my medical care

## 2023-10-19 NOTE — Telephone Encounter (Signed)
 Pt has been scheduled tele preop appt 10/23/23. Dr. Eichman is waiting for clearance before scheduling procedure . Med rec and consent are done.

## 2023-10-19 NOTE — Telephone Encounter (Signed)
 REQUESTING OFFICE SENT DUPLICATE INQUIRING IF PT HAS BEEN CLEARED.   Pt has tele preop appt 10/23/23

## 2023-10-23 ENCOUNTER — Ambulatory Visit: Attending: Internal Medicine

## 2023-10-23 DIAGNOSIS — Z01810 Encounter for preprocedural cardiovascular examination: Secondary | ICD-10-CM

## 2023-10-23 NOTE — Addendum Note (Signed)
 Addended by: Ely Ballen on: 10/23/2023 02:44 PM   Modules accepted: Level of Service

## 2023-10-23 NOTE — Progress Notes (Signed)
 Virtual Visit via Telephone Note   Because of [name] co-morbid illnesses, she is at least at moderate risk for complications without adequate follow up.  This format is felt to be most appropriate for this patient at this time.  Due to technical limitations with video connection (technology), today's appointment will be conducted as an audio only telehealth visit, and [name] verbally agreed to proceed in this manner.   All issues noted in this document were discussed and addressed.  No physical exam could be performed with this format.  Evaluation Performed:  Preoperative cardiovascular risk assessment _____________   Date:  10/23/2023   Patient ID:  [name], DOB [birth year], MRN [id number] Patient Location:  Home Provider location:   Office  Primary Care Provider:  Colette Torrence GRADE, MD Primary Cardiologist:  Annabella Scarce, MD Chief Complaint / Patient Profile  68 y.o. y/o female with a h/o paroxysmal atrial fibrillation/flutter, chronic diastolic heart failure, hypertension, OSA, asthma, GERD and obesity who is pending L2-3 ESI and presents today for telephonic preoperative cardiovascular risk assessment. History of Present Illness  [name] is a 68 y.o. female who presents via audio/video conferencing for a telehealth visit today.  Pt was last seen in cardiology clinic on 01/28/23 by Dr. [name].  At that time [name] was doing well.  The patient is now pending procedure as outlined above. Since her last visit, she has remained stable from a cardiac standpoint. Today she denies chest pain, shortness of breath, lower extremity edema, fatigue, palpitations, melena, hematuria, hemoptysis, diaphoresis, weakness, presyncope, syncope, orthopnea, and PND. She is able to achieve greater than 4 METs of activity with walking, climbing stairs and completing household chores.  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 unabale to pass ETT with Cleotilde 2; unable to visualize cord with MAC 3; intubating LMA used; uneventful intubation using Glidescope 2016, 2022   Diverticulosis, with history of diverticulitis of descending and sigmoid colon 03/03/2007   Dysrhythmia    atrial fib   Edema of both lower legs    Essential hypertension 06/25/2006   Qualifier: Diagnosis of  By: Sherron CMA, Steen     Fatty liver    Gastritis    GERD 03/03/2007   Qualifier: Diagnosis of  By: [name] (AAMA), [name]   Qualifier: Diagnosis of  By: Ever Riggers, [name]  pt denies at preop of 02/15/20    Hepatic steatosis, found on CT 03/03/2007       Hiatal hernia, small 03/28/2016   EGD by [facility] 12/09/12.    High blood pressure    Hip pain    History of colonic polyps 04/21/2008   Last Colonoscopy was 12/09/12, Dr. [name]. No polyp at that time. Repeat in 5 years.    Internal hemorrhoids 03/03/2007   Last Colonoscopy was 12/09/12, Dr. [name]. Repeat in 5 years.    Joint pain    Lactose intolerance  Left adrenal mass 05/12/2013   Menopausal vasomotor syndrome 03/28/2016   Morbid obesity (HCC) (BMI 35 plus 2 comorbidities) 03/06/2016   Comorbid conditions include HTN, HLD, hepatic steatosis, OA, GERD, OSA.   OSA (obstructive sleep apnea) 03/03/2007   cpap    PAF (paroxysmal atrial fibrillation) (HCC) 04/24/2015   Palpitations    PONV (postoperative nausea and vomiting)    area on left forearm DO NOT PLACE IV OR STICK ? vascularwill bleed   Umbilical hernia without  obstruction or gangrene, fat containing 03/28/2016   Found on CT, 2017. No issues.   Past Surgical History:  Procedure Laterality Date   ABDOMINAL HYSTERECTOMY     APPENDECTOMY     BREAST REDUCTION SURGERY     BUNIONECTOMY     bilateral   CERVICAL FUSION     CHOLECYSTECTOMY N/A 10/01/2016   Procedure: LAPAROSCOPIC CHOLECYSTECTOMY;  Surgeon: [name], MD;  Location: [facility] OR;  Service: General;  Laterality: N/A;   COLONOSCOPY WITH PROPOFOL  N/A 05/12/2019   Procedure: COLONOSCOPY WITH PROPOFOL ;  Surgeon: [name], MD;  Location: [facility] ENDOSCOPY;  Service: Endoscopy;  Laterality: N/A;   ESOPHAGOGASTRODUODENOSCOPY (EGD) WITH PROPOFOL  N/A 09/20/2021   Procedure: ESOPHAGOGASTRODUODENOSCOPY (EGD) WITH PROPOFOL ;  Surgeon: [name], MD;  Location: [facility] ENDOSCOPY;  Service: Gastroenterology;  Laterality: N/A;   MAXIMUM ACCESS (MAS)POSTERIOR LUMBAR INTERBODY FUSION (PLIF) 2 LEVEL N/A 09/27/2014   Procedure: Maximum Access Surgery Posterior Lumbar Interbody Fusion Lumbar three-four, Lumbar four-five;  Surgeon: [name], MD;  Location: [facility];  Service: Neurosurgery;  Laterality: N/A;   POLYPECTOMY  05/12/2019   Procedure: POLYPECTOMY;  Surgeon: [name], MD;  Location: [facility]OSCOPY;  Service: Endoscopy;;   SPINE SURGERY     TOTAL ABDOMINAL HYSTERECTOMY W/ BILATERAL SALPINGOOPHORECTOMY     TOTAL HIP ARTHROPLASTY Left 02/21/2020   Procedure: TOTAL HIP ARTHROPLASTY ANTERIOR APPROACH;  Surgeon: [name], MD;  Location: [facility] ORS;  Service: Orthopedics;  Laterality: Left;  70 mins   TOTAL HIP ARTHROPLASTY Right 05/14/2021   Procedure: TOTAL HIP ARTHROPLASTY ANTERIOR APPROACH;  Surgeon: [name], MD;  Location: [facility] ORS;  Service: Orthopedics;  Laterality: Right;    Allergies  Allergies  Allergen Reactions   Other Other (See Comments)    ekg pads burns skin needs hypoallergenic   Losartan  Other (See Comments)    Chest pain.    Tramadol  Other (See Comments)    GI upset, I felt  like I was in a fog    Home Medications    Prior to Admission medications   Medication Sig Start Date End Date Taking? Authorizing Provider  albuterol  (VENTOLIN  HFA) 108 (90 Base) MCG/ACT inhaler Inhale 2 puffs into the lungs every 6 (six) hours as needed for wheezing or shortness of breath. 02/09/23   Colette Torrence GRADE, MD  amLODipine  (NORVASC ) 10 MG tablet Take 1 tablet (10 mg total) by mouth daily. Patient not taking: Reported on 10/19/2023 01/28/23   Nahser, Aleene PARAS, MD  apixaban  (ELIQUIS ) 5 MG TABS tablet TAKE ONE TABLET BY MOUTH TWICE A DAY 05/15/23   Nahser, Aleene PARAS, MD  benzonatate  (TESSALON ) 100 MG capsule Take 1 capsule (100 mg total) by mouth 2 (two) times daily as needed for cough. Patient not taking: Reported on 10/19/2023 02/09/23   Colette Torrence GRADE, MD  Calcium  Carb-Cholecalciferol (CALCIUM  600+D3 PO) Take 1 tablet by mouth in the morning.    [provider]  metoprolol  succinate (TOPROL  XL) 25 MG 24 hr tablet Take 1 tablet (25 mg  total) by mouth 2 (two) times daily. 05/15/23   Nahser, Aleene PARAS, MD  Multiple Vitamin (MULTIVITAMIN WITH MINERALS) TABS tablet Take 1 tablet by mouth in the morning.    [provider]  olmesartan -hydrochlorothiazide  (BENICAR  HCT) 20-12.5 MG tablet Take 1 tablet by mouth daily.    [provider]  Probiotic Product (ULTRAFLORA IMMUNE HEALTH PO) Take 1 tablet by mouth daily.    [provider]  propranolol  (INDERAL ) 10 MG tablet Take 1 tablet (10 mg total) by mouth 4 (four) times daily as needed. 04/07/22   Nahser, Aleene PARAS, MD  Semaglutide ,0.25 or 0.5MG /DOS, (OZEMPIC , 0.25 OR 0.5 MG/DOSE,) 2 MG/1.5ML SOPN Inject 0.5 mg into the skin once a week. Patient not taking: Reported on 10/19/2023 01/29/23   Colette Torrence GRADE, MD  tirzepatide (ZEPBOUND) 10 MG/0.5ML injection vial Inject into the skin once a week.    [provider]  TURMERIC PO Take 1 capsule by mouth in the morning.    [provider]    Physical  Exam  Vital Signs:  JOLENA KITTLE does not have vital signs available for review today. Given telephonic nature of communication, physical exam is limited. AAOx3. NAD. Normal affect.  Speech and respirations are unlabored. Accessory Clinical Findings  None Assessment & Plan  1.  Preoperative Cardiovascular Risk Assessment: Ms. Kemple's perioperative risk of a major cardiac event is 0.9% according to the Revised Cardiac Risk Index (RCRI).  Therefore, she is at low risk for perioperative complications.   Her functional capacity is good at 6.05 METs according to the Duke Activity Status Index (DASI). Recommendations: According to ACC/AHA guidelines, no further cardiovascular testing needed.  The patient may proceed to surgery at acceptable risk.   Antiplatelet and/or Anticoagulation Recommendations: Eliquis  (Apixaban ) can be held for 3 days prior to surgery.  Please resume post op when felt to be safe.     The patient was advised that if she develops new symptoms prior to surgery to contact our office to arrange for a follow-up visit, and she verbalized understanding.  A copy of this note will be routed to requesting surgeon.  Time:   Today, I have spent 10 minutes with the patient with telehealth technology discussing medical history, symptoms, and management plan.    Nedda Gains D Shawnn Bouillon, NP 10/23/2023, 2:43 PM

## 2023-11-27 ENCOUNTER — Other Ambulatory Visit

## 2023-11-27 ENCOUNTER — Ambulatory Visit
Admission: RE | Admit: 2023-11-27 | Discharge: 2023-11-27 | Disposition: A | Discharge status: Home / Self Care | Attending: Family Medicine | Admitting: Family Medicine

## 2023-11-27 VITALS — BP 135/73 | HR 91 | Temp 98.5°F | Resp 20

## 2023-11-27 DIAGNOSIS — J40 Bronchitis, not specified as acute or chronic: Secondary | ICD-10-CM

## 2023-11-27 DIAGNOSIS — J0100 Acute maxillary sinusitis, unspecified: Secondary | ICD-10-CM | POA: Diagnosis not present

## 2023-11-27 MED ORDER — PREDNISONE 20 MG PO TABS: ORAL_TABLET | ORAL | 0 refills | Status: DC

## 2023-11-27 MED ORDER — AMOXICILLIN-POT CLAVULANATE 875-125 MG PO TABS: ORAL_TABLET | ORAL | 0 refills | Status: DC

## 2023-11-27 NOTE — ED Triage Notes (Signed)
 Pt presents to UC for c/o productive cough and nasal congestion x2 weeks. Pt was prescribed prednisone  and doxycycline  3 days ago but she states that she has had blood in her nasal drainage and became worried. She states the cough is worse at night. She has tried cough drops but no OTC cough medicine.

## 2023-11-27 NOTE — ED Provider Notes (Signed)
 [name]    CSN: [id number] Arrival date & time: 11/27/23  1516      History   Chief Complaint Chief Complaint  Patient presents with   Cough    Congestion and blood when blowing nose for the last two days - Entered by patient    HPI [name] is a 68 y.o. female.   Patient was evaluated and treated three days ago for bronchitis after experiencing URI symptoms with wheezing for five days. She was prescribed doxycycline , prednisone  20mg  daily for 5 days, and albuterol  inhaler. She complains of persistent cough worse at night and onset of bloody nasal discharge.  She denies pleuritic pain but continues to wheeze.  She developed sweats last night.  The history is provided by a relative.    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 unabale to pass ETT with Cleotilde 2; unable to visualize cord with MAC 3; intubating LMA used; uneventful intubation using Glidescope 2016, 2022   Diverticulosis, with history of diverticulitis of descending and sigmoid colon 03/03/2007   Dysrhythmia    atrial fib   Edema of both lower legs    Essential hypertension 06/25/2006   Qualifier: Diagnosis of  By: Sherron CMA, Steen     Fatty liver    Gastritis    GERD 03/03/2007   Qualifier: Diagnosis of  By: [name] (AAMA), [name]   Qualifier: Diagnosis of  By: Ever Riggers, [name]  pt denies at preop of 02/15/20    Hepatic steatosis, found on CT 03/03/2007       Hiatal hernia, small 03/28/2016   EGD by [facility]  12/09/12.    High blood pressure    Hip pain    History of colonic polyps 04/21/2008   Last Colonoscopy was 12/09/12, Dr. [name]. No polyp at that time. Repeat in 5 years.    Internal hemorrhoids 03/03/2007   Last Colonoscopy was 12/09/12, Dr. [name]. Repeat in 5 years.    Joint pain    Lactose intolerance    Left adrenal mass 05/12/2013   Menopausal vasomotor syndrome 03/28/2016   Morbid obesity (HCC) (BMI 35 plus 2 comorbidities) 03/06/2016   Comorbid conditions include HTN, HLD, hepatic steatosis, OA, GERD, OSA.   OSA (obstructive sleep apnea) 03/03/2007   cpap    PAF (paroxysmal atrial fibrillation) (HCC) 04/24/2015   Palpitations    PONV (postoperative nausea and vomiting)    area on left forearm DO NOT PLACE IV OR STICK ? vascularwill bleed   Umbilical hernia without obstruction or gangrene, fat containing 03/28/2016   Found on CT, 2017. No issues.    Patient Active Problem List   Diagnosis Date Noted   Insulin  resistance 06/24/2022   Other fatigue 06/23/2022   SOB (shortness of breath) on exertion 06/23/2022   B12 deficiency 06/23/2022   Vitamin D  deficiency 06/23/2022   Generalized obesity 06/23/2022   BMI 39.0-39.9,adult 06/23/2022   Olecranon bursitis of left elbow 04/01/2022   S/P total  right hip arthroplasty 05/14/2021   Preoperative cardiovascular examination 04/10/2021   Osteoarthritis of right hip 01/20/2021   Class 2 obesity due to excess calories with body mass index (BMI) of 37.0 to 37.9 in adult 10/24/2020   Prediabetes 10/23/2020   NAFLD (nonalcoholic fatty liver disease) 89/81/7977   Pain of left hip joint 07/23/2020   Osteoarthritis of left hip 02/21/2020   Status post left hip replacement 02/21/2020   Biliary dyskinesia 10/01/2016   History of hysterectomy 03/28/2016   History of appendectomy 03/28/2016   Umbilical hernia without obstruction or gangrene, fat containing 03/28/2016   History of fusion of cervical spine 03/28/2016   Hiatal hernia, small  03/28/2016   Forearm mass, left 03/28/2016   Morbid obesity (HCC) (BMI 35 plus 2 comorbidities) 03/06/2016   Chronic heart failure with preserved ejection fraction (HCC) 07/20/2015   PAF (paroxysmal atrial fibrillation) (HCC) 04/24/2015   History of lumbar spinal fusion 09/27/2014   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   Left adrenal mass 05/12/2013   Allergic rhinitis 04/03/2009   Asthma, mild, intermittent 01/23/2009   History of colonic polyps 04/21/2008   Depression, recurrent (HCC) 03/03/2007   Internal hemorrhoids 03/03/2007   GERD 03/03/2007   Diverticulosis, with history of diverticulitis of descending and sigmoid colon 03/03/2007   Hepatic steatosis, found on CT 03/03/2007   OSA (obstructive sleep apnea) 03/03/2007   Essential hypertension 06/25/2006    Past Surgical History:  Procedure Laterality Date   ABDOMINAL HYSTERECTOMY     APPENDECTOMY     BREAST REDUCTION SURGERY     BUNIONECTOMY     bilateral   CERVICAL FUSION     CHOLECYSTECTOMY N/A 10/01/2016   Procedure: LAPAROSCOPIC CHOLECYSTECTOMY;  Surgeon: [name], MD;  Location: [facility] OR;  Service: General;  Laterality: N/A;   COLONOSCOPY WITH PROPOFOL  N/A 05/12/2019   Procedure: COLONOSCOPY WITH PROPOFOL ;  Surgeon: [name], MD;  Location: [facility];  Service: Endoscopy;  Laterality: N/A;   ESOPHAGOGASTRODUODENOSCOPY (EGD) WITH PROPOFOL  N/A 09/20/2021   Procedure: ESOPHAGOGASTRODUODENOSCOPY (EGD) WITH PROPOFOL ;  Surgeon: [name], MD;  Location: [facility];  Service: Gastroenterology;  Laterality: N/A;   MAXIMUM ACCESS (MAS)POSTERIOR LUMBAR INTERBODY FUSION (PLIF) 2 LEVEL N/A 09/27/2014   Procedure: Maximum Access Surgery Posterior Lumbar Interbody Fusion Lumbar three-four, Lumbar four-five;  Surgeon: [name], MD;  Location: [facility];  Service: Neurosurgery;  Laterality: N/A;   POLYPECTOMY  05/12/2019   Procedure: POLYPECTOMY;  Surgeon: [name], MD;  Location: [facility];   Service: Endoscopy;;   SPINE SURGERY     TOTAL ABDOMINAL HYSTERECTOMY W/ BILATERAL SALPINGOOPHORECTOMY     TOTAL HIP ARTHROPLASTY Left 02/21/2020   Procedure: TOTAL HIP ARTHROPLASTY ANTERIOR APPROACH;  Surgeon: [name], MD;  Location: [facility]S;  Service: Orthopedics;  Laterality: Left;  70 mins   TOTAL HIP ARTHROPLASTY Right 05/14/2021   Procedure: TOTAL HIP ARTHROPLASTY ANTERIOR APPROACH;  Surgeon: [name], MD;  Location: [facility];  Service: Orthopedics;  Laterality: Right;    OB History     Gravida  2   Para  2   Term      Preterm      AB      Living         SAB      IAB      Ectopic      Multiple      Live Births               Home Medications  Prior to Admission medications   Medication Sig Start Date End Date Taking? Authorizing Provider  amoxicillin -clavulanate (AUGMENTIN ) 875-125 MG tablet Take one tab PO Q12hr PC 11/27/23  Yes Pauline Garnette LABOR, MD  predniSONE  (DELTASONE ) 20 MG tablet Take one tab by mouth twice daily for 4 days, then one daily. Take with food. 11/27/23  Yes Pauline Garnette LABOR, MD  albuterol  (VENTOLIN  HFA) 108 (90 Base) MCG/ACT inhaler Inhale 2 puffs into the lungs every 6 (six) hours as needed for wheezing or shortness of breath. 02/09/23   Colette Torrence GRADE, MD  amLODipine  (NORVASC ) 10 MG tablet Take 1 tablet (10 mg total) by mouth daily. Patient not taking: Reported on 10/19/2023 01/28/23   Nahser, Aleene PARAS, MD  apixaban  (ELIQUIS ) 5 MG TABS tablet TAKE ONE TABLET BY MOUTH TWICE A DAY 05/15/23   Nahser, Aleene PARAS, MD  benzonatate  (TESSALON ) 100 MG capsule Take 1 capsule (100 mg total) by mouth 2 (two) times daily as needed for cough. Patient not taking: Reported on 10/19/2023 02/09/23   Colette Torrence GRADE, MD  Calcium  Carb-Cholecalciferol (CALCIUM  600+D3 PO) Take 1 tablet by mouth in the morning.    [provider]  metoprolol  succinate (TOPROL  XL) 25 MG 24 hr tablet Take 1 tablet (25 mg total) by mouth 2 (two) times daily. 05/15/23    Nahser, Aleene PARAS, MD  Multiple Vitamin (MULTIVITAMIN WITH MINERALS) TABS tablet Take 1 tablet by mouth in the morning.    [provider]  olmesartan -hydrochlorothiazide  (BENICAR  HCT) 20-12.5 MG tablet Take 1 tablet by mouth daily.    [provider]  Probiotic Product (ULTRAFLORA IMMUNE HEALTH PO) Take 1 tablet by mouth daily.    [provider]  propranolol  (INDERAL ) 10 MG tablet Take 1 tablet (10 mg total) by mouth 4 (four) times daily as needed. 04/07/22   Nahser, Aleene PARAS, MD  Semaglutide ,0.25 or 0.5MG /DOS, (OZEMPIC , 0.25 OR 0.5 MG/DOSE,) 2 MG/1.5ML SOPN Inject 0.5 mg into the skin once a week. Patient not taking: Reported on 10/19/2023 01/29/23   Colette Torrence GRADE, MD  tirzepatide (ZEPBOUND) 10 MG/0.5ML injection vial Inject into the skin once a week.    [provider]  TURMERIC PO Take 1 capsule by mouth in the morning.    [provider]    Family History Family History  Problem Relation Age of Onset   Diabetes Mother    Hypertension Mother    Heart disease Mother    Sleep apnea Mother    Depression Mother    Eating disorder Mother    Obesity Mother    Alcoholism Father    Coronary artery disease Sister    Hypertension Sister    Colon cancer Neg Hx     Social History Social History   Tobacco Use   Smoking status: Never   Smokeless tobacco: Never  Vaping Use   Vaping status: Never Used  Substance Use Topics   Alcohol use: No   Drug use: No     Allergies   Other, Losartan , and Tramadol    Review of Systems Review of Systems No sore throat + cough No pleuritic pain + wheezing + nasal congestion + post-nasal drainage ? sinus pain/pressure No itchy/red eyes No earache No hemoptysis + SOB No fever, + sweats No nausea No vomiting No abdominal pain No diarrhea No urinary symptoms No skin rash + fatigue No myalgias No headache   Physical Exam Triage Vital Signs ED Triage Vitals  Encounter Vitals Group  BP 11/27/23 1556 135/73     Girls Systolic BP Percentile --      Girls Diastolic BP Percentile --      Boys Systolic BP Percentile --      Boys Diastolic BP Percentile --      Pulse Rate 11/27/23 1556 91     Resp 11/27/23 1556 20     Temp 11/27/23 1556 98.5 F (36.9 C)     Temp Source 11/27/23 1556 Oral     SpO2 11/27/23 1556 95 %     Weight --      Height --      Head Circumference --      Peak Flow --      Pain Score 11/27/23 1550 0     Pain Loc --      Pain Education --      Exclude from Growth Chart --    No data found.  Updated Vital Signs BP 135/73 (BP Location: Right Arm)   Pulse 91   Temp 98.5 F (36.9 C) (Oral)   Resp 20   SpO2 95%   Visual Acuity Right Eye Distance:   Left Eye Distance:   Bilateral Distance:    Right Eye Near:   Left Eye Near:    Bilateral Near:     Physical Exam Nursing notes and Vital Signs reviewed. Appearance:  Patient appears stated age, and in no acute distress Eyes:  Pupils are equal, round, and reactive to light and accomodation.  Extraocular movement is intact.  Conjunctivae are not inflamed  Ears:  Canals normal.  Tympanic membranes normal.  Nose:  Congested turbinates. Maxillary sinus tenderness present. Pharynx:  Normal Neck:  Supple.  Mildly enlarged lateral nodes are present, tender to palpation on the left.   Lungs:  Bibasilar wheezing present.  Breath sounds are equal.  Moving air well. Heart:  Regular rate and rhythm without murmurs, rubs, or gallops.  Abdomen:  Nontender without masses or hepatosplenomegaly.  Bowel sounds are present.  No CVA or flank tenderness.  Extremities:  No edema.  Skin:  No rash present.   UC Treatments / Results  Labs (all labs ordered are listed, but only abnormal results are displayed) Labs Reviewed - No data to display  EKG   Radiology No results found.  Procedures Procedures (including critical care time)  Medications Ordered in UC Medications - No data to  display  Initial Impression / Assessment and Plan / UC Course  I have reviewed the triage vital signs and the nursing notes.  Pertinent labs & imaging results that were available during my care of the patient were reviewed by me and considered in my medical decision making (see chart for details).    Begin Augmentin  875 Q12hr for one week, and prednisone  burst/taper. Followup with Family Doctor if not improved in one week.   Final Clinical Impressions(s) / UC Diagnoses   Final diagnoses:  Acute maxillary sinusitis, recurrence not specified  Bronchitis     Discharge Instructions      Stop doxycycline .  Continue albuterol  as needed for wheezing and shortness of breath. Take plain guaifenesin  (1200mg  extended release tabs such as Mucinex ) twice daily, with plenty of water , for cough and congestion.  Get adequate rest.   May use Afrin nasal spray (or generic oxymetazoline) each morning for about 5 days and then discontinue.  Also recommend using saline nasal spray several times daily and saline nasal irrigation (AYR is a common brand).  May discontinue Flonase   spray. Try warm salt water  gargles for sore throat.  Stop all antihistamines for now, and other non-prescription cough/cold preparations. May take Delsym Cough Suppressant (12 Hour Cough Relief) at bedtime for nighttime cough.   If symptoms become significantly worse during the night or over the weekend, proceed to the local emergency room.     ED Prescriptions     Medication Sig Dispense Auth. Provider   amoxicillin -clavulanate (AUGMENTIN ) 875-125 MG tablet Take one tab PO Q12hr PC 14 tablet Pauline Garnette LABOR, MD   predniSONE  (DELTASONE ) 20 MG tablet Take one tab by mouth twice daily for 4 days, then one daily. Take with food. 12 tablet Pauline Garnette LABOR, MD         Pauline Garnette LABOR, MD 11/28/23 [phone]

## 2023-11-27 NOTE — Discharge Instructions (Signed)
 Stop doxycycline .  Continue albuterol  as needed for wheezing and shortness of breath. Take plain guaifenesin  (1200mg  extended release tabs such as Mucinex ) twice daily, with plenty of water , for cough and congestion.  Get adequate rest.   May use Afrin nasal spray (or generic oxymetazoline) each morning for about 5 days and then discontinue.  Also recommend using saline nasal spray several times daily and saline nasal irrigation (AYR is a common brand).  May discontinue Flonase  spray. Try warm salt water  gargles for sore throat.  Stop all antihistamines for now, and other non-prescription cough/cold preparations. May take Delsym Cough Suppressant (12 Hour Cough Relief) at bedtime for nighttime cough.   If symptoms become significantly worse during the night or over the weekend, proceed to the local emergency room.

## 2023-12-11 ENCOUNTER — Ambulatory Visit

## 2023-12-11 ENCOUNTER — Encounter: Payer: Self-pay | Admitting: *Deleted

## 2023-12-11 ENCOUNTER — Other Ambulatory Visit: Payer: Self-pay | Admitting: *Deleted

## 2023-12-11 ENCOUNTER — Ambulatory Visit
Admission: RE | Admit: 2023-12-11 | Discharge: 2023-12-11 | Disposition: A | Source: Ambulatory Visit | Attending: Certified Nurse Midwife

## 2023-12-11 DIAGNOSIS — N644 Mastodynia: Secondary | ICD-10-CM

## 2024-01-10 ENCOUNTER — Ambulatory Visit
Admission: EM | Admit: 2024-01-10 | Discharge: 2024-01-10 | Disposition: A | Discharge status: Home / Self Care | Attending: Family Medicine | Admitting: Family Medicine

## 2024-01-10 ENCOUNTER — Other Ambulatory Visit: Payer: Self-pay

## 2024-01-10 DIAGNOSIS — R059 Cough, unspecified: Secondary | ICD-10-CM

## 2024-01-10 DIAGNOSIS — J101 Influenza due to other identified influenza virus with other respiratory manifestations: Secondary | ICD-10-CM | POA: Diagnosis not present

## 2024-01-10 DIAGNOSIS — R509 Fever, unspecified: Secondary | ICD-10-CM | POA: Diagnosis not present

## 2024-01-10 LAB — POC COVID19/FLU A&B COMBO
Covid Antigen, POC: NEGATIVE
Influenza A Antigen, POC: POSITIVE — AB
Influenza B Antigen, POC: NEGATIVE

## 2024-01-10 MED ORDER — HYDROCODONE BIT-HOMATROP MBR 5-1.5 MG/5ML PO SOLN: 5.0000 mL | Freq: Four times a day (QID) | ORAL | 0 refills | Status: DC | PRN

## 2024-01-10 MED ORDER — ACETAMINOPHEN 325 MG PO TABS
975.0000 mg | ORAL_TABLET | Freq: Once | ORAL | Status: AC
  Administered 2024-01-10: 975 mg via ORAL

## 2024-01-10 MED ORDER — OSELTAMIVIR PHOSPHATE 75 MG PO CAPS: 75.0000 mg | ORAL_CAPSULE | Freq: Two times a day (BID) | ORAL | 0 refills | Status: DC

## 2024-01-10 MED ORDER — ACETAMINOPHEN 325 MG PO TABS: 650.0000 mg | ORAL_TABLET | Freq: Once | ORAL | Status: DC

## 2024-01-10 NOTE — Discharge Instructions (Addendum)
 Advised patient take medication as directed with food to completion.  Advised may take OTC Tylenol  1000 mg every 6 hours for fever (oral temperature greater than 100.3).  Advised may take Hycodan cough syrup at night prior to sleep for cough due to sedative  effects.  Encouraged increase daily water  intake to 64 ounces per day while taking these medications.  Advised if symptoms worsen and/or unresolved please follow-up with your PCP or here for further evaluation.

## 2024-01-10 NOTE — ED Provider Notes (Signed)
 " [name]    CSN: [id number] Arrival date & time: 01/10/24  1425      History   Chief Complaint Chief Complaint  Patient presents with   Cough    HPI [name] is a 69 y.o. female.   HPI 69 year old female presents with cough and weakness.  PMH significant for obesity, A-fib, HTN, and chronic heart failure with preserved ejection fraction.  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 unabale to pass ETT with Cleotilde 2; unable to visualize cord with MAC 3; intubating LMA used; uneventful intubation using Glidescope 2016, 2022   Diverticulosis, with history of diverticulitis of descending and sigmoid colon 03/03/2007   Dysrhythmia    atrial fib   Edema of both lower legs    Essential hypertension 06/25/2006   Qualifier: Diagnosis of  By: Sherron CMA, Steen     Fatty liver    Gastritis    GERD 03/03/2007   Qualifier: Diagnosis of  By: [name] (AAMA), [name]   Qualifier: Diagnosis of  By: Ever Riggers, [name]  pt denies at preop of 02/15/20    Hepatic steatosis, found on CT 03/03/2007       Hiatal hernia, small 03/28/2016   EGD by [facility] 12/09/12.    High blood pressure    Hip pain    History of colonic polyps 04/21/2008   Last Colonoscopy was 12/09/12, Dr. [name]. No polyp at that time. Repeat in 5 years.    Internal hemorrhoids 03/03/2007   Last Colonoscopy was 12/09/12, Dr. [name]. Repeat in 5 years.    Joint pain    Lactose intolerance    Left adrenal mass 05/12/2013   Menopausal  vasomotor syndrome 03/28/2016   Morbid obesity (HCC) (BMI 35 plus 2 comorbidities) 03/06/2016   Comorbid conditions include HTN, HLD, hepatic steatosis, OA, GERD, OSA.   OSA (obstructive sleep apnea) 03/03/2007   cpap    PAF (paroxysmal atrial fibrillation) (HCC) 04/24/2015   Palpitations    PONV (postoperative nausea and vomiting)    area on left forearm DO NOT PLACE IV OR STICK ? vascularwill bleed   Umbilical hernia without obstruction or gangrene, fat containing 03/28/2016   Found on CT, 2017. No issues.    Patient Active Problem List   Diagnosis Date Noted   Insulin  resistance 06/24/2022   Other fatigue 06/23/2022   SOB (shortness of breath) on exertion 06/23/2022   B12 deficiency 06/23/2022   Vitamin D  deficiency 06/23/2022   Generalized obesity 06/23/2022   BMI 39.0-39.9,adult 06/23/2022   Olecranon bursitis of left elbow 04/01/2022   S/P total right hip arthroplasty 05/14/2021   Preoperative cardiovascular examination 04/10/2021   Osteoarthritis of right hip 01/20/2021   Class 2 obesity due to excess calories with body mass index (BMI) of 37.0 to 37.9 in adult 10/24/2020   Prediabetes 10/23/2020   NAFLD (nonalcoholic fatty liver disease) 89/81/7977   Pain of left hip joint 07/23/2020   Osteoarthritis of left hip 02/21/2020  Status post left hip replacement 02/21/2020   Biliary dyskinesia 10/01/2016   History of hysterectomy 03/28/2016   History of appendectomy 03/28/2016   Umbilical hernia without obstruction or gangrene, fat containing 03/28/2016   History of fusion of cervical spine 03/28/2016   Hiatal hernia, small 03/28/2016   Forearm mass, left 03/28/2016   Morbid obesity (HCC) (BMI 35 plus 2 comorbidities) 03/06/2016   Chronic heart failure with preserved ejection fraction (HCC) 07/20/2015   PAF (paroxysmal atrial fibrillation) (HCC) 04/24/2015   History of lumbar spinal fusion 09/27/2014   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   Left adrenal  mass 05/12/2013   Allergic rhinitis 04/03/2009   Asthma, mild, intermittent 01/23/2009   History of colonic polyps 04/21/2008   Depression, recurrent (HCC) 03/03/2007   Internal hemorrhoids 03/03/2007   GERD 03/03/2007   Diverticulosis, with history of diverticulitis of descending and sigmoid colon 03/03/2007   Hepatic steatosis, found on CT 03/03/2007   OSA (obstructive sleep apnea) 03/03/2007   Essential hypertension 06/25/2006    Past Surgical History:  Procedure Laterality Date   ABDOMINAL HYSTERECTOMY     APPENDECTOMY     BREAST REDUCTION SURGERY     BUNIONECTOMY     bilateral   CERVICAL FUSION     CHOLECYSTECTOMY N/A 10/01/2016   Procedure: LAPAROSCOPIC CHOLECYSTECTOMY;  Surgeon: [name], MD;  Location: [facility];  Service: General;  Laterality: N/A;   COLONOSCOPY WITH PROPOFOL  N/A 05/12/2019   Procedure: COLONOSCOPY WITH PROPOFOL ;  Surgeon: [name], MD;  Location: [facility];  Service: Endoscopy;  Laterality: N/A;   ESOPHAGOGASTRODUODENOSCOPY (EGD) WITH PROPOFOL  N/A 09/20/2021   Procedure: ESOPHAGOGASTRODUODENOSCOPY (EGD) WITH PROPOFOL ;  Surgeon: [name], MD;  Location: [facility];  Service: Gastroenterology;  Laterality: N/A;   MAXIMUM ACCESS (MAS)POSTERIOR LUMBAR INTERBODY FUSION (PLIF) 2 LEVEL N/A 09/27/2014   Procedure: Maximum Access Surgery Posterior Lumbar Interbody Fusion Lumbar three-four, Lumbar four-five;  Surgeon: [name], MD;  Location: [facility];  Service: Neurosurgery;  Laterality: N/A;   POLYPECTOMY  05/12/2019   Procedure: POLYPECTOMY;  Surgeon: [name], MD;  Location: [facility];  Service: Endoscopy;;   SPINE SURGERY     TOTAL ABDOMINAL HYSTERECTOMY W/ BILATERAL SALPINGOOPHORECTOMY     TOTAL HIP ARTHROPLASTY Left 02/21/2020   Procedure: TOTAL HIP ARTHROPLASTY ANTERIOR APPROACH;  Surgeon: [name], MD;  Location: [facility];  Service: Orthopedics;  Laterality: Left;  70 mins   TOTAL HIP ARTHROPLASTY Right 05/14/2021   Procedure:  TOTAL HIP ARTHROPLASTY ANTERIOR APPROACH;  Surgeon: [name], MD;  Location: [facility];  Service: Orthopedics;  Laterality: Right;    OB History     Gravida  2   Para  2   Term      Preterm      AB      Living         SAB      IAB      Ectopic      Multiple      Live Births               Home Medications    Prior to Admission medications  Medication Sig Start Date End Date Taking? Authorizing Provider  HYDROcodone  bit-homatropine (HYCODAN) 5-1.5 MG/5ML syrup Take 5 mLs by mouth every 6 (six) hours as needed for cough. 01/10/24  Yes Teddy Sharper, FNP  metoprolol  succinate (TOPROL  XL) 25 MG 24 hr tablet Take 1 tablet (25 mg total) by mouth 2 (two) times daily. 05/15/23  Yes Nahser,  Aleene PARAS, MD  oseltamivir  (TAMIFLU ) 75 MG capsule Take 1 capsule (75 mg total) by mouth every 12 (twelve) hours. 01/10/24  Yes Teddy Sharper, FNP  albuterol  (VENTOLIN  HFA) 108 (90 Base) MCG/ACT inhaler Inhale 2 puffs into the lungs every 6 (six) hours as needed for wheezing or shortness of breath. 02/09/23   Colette Torrence GRADE, MD  amLODipine  (NORVASC ) 10 MG tablet Take 1 tablet (10 mg total) by mouth daily. Patient not taking: Reported on 10/19/2023 01/28/23   Nahser, Aleene PARAS, MD  apixaban  (ELIQUIS ) 5 MG TABS tablet TAKE ONE TABLET BY MOUTH TWICE A DAY 05/15/23   Nahser, Aleene PARAS, MD  Calcium  Carb-Cholecalciferol (CALCIUM  600+D3 PO) Take 1 tablet by mouth in the morning.    [provider]  Multiple Vitamin (MULTIVITAMIN WITH MINERALS) TABS tablet Take 1 tablet by mouth in the morning.    [provider]  olmesartan -hydrochlorothiazide  (BENICAR  HCT) 20-12.5 MG tablet Take 1 tablet by mouth daily.    [provider]  Probiotic Product (ULTRAFLORA IMMUNE HEALTH PO) Take 1 tablet by mouth daily.    [provider]  propranolol  (INDERAL ) 10 MG tablet Take 1 tablet (10 mg total) by mouth 4 (four) times daily as needed. 04/07/22   Nahser, Aleene PARAS, MD   Semaglutide ,0.25 or 0.5MG /DOS, (OZEMPIC , 0.25 OR 0.5 MG/DOSE,) 2 MG/1.5ML SOPN Inject 0.5 mg into the skin once a week. Patient not taking: Reported on 10/19/2023 01/29/23   Colette Torrence GRADE, MD  tirzepatide (ZEPBOUND) 10 MG/0.5ML injection vial Inject into the skin once a week.    [provider]  TURMERIC PO Take 1 capsule by mouth in the morning.    [provider]    Family History Family History  Problem Relation Age of Onset   Diabetes Mother    Hypertension Mother    Heart disease Mother    Sleep apnea Mother    Depression Mother    Eating disorder Mother    Obesity Mother    Alcoholism Father    Coronary artery disease Sister    Hypertension Sister    Colon cancer Neg Hx     Social History Social History[1]   Allergies   Other, Losartan , and Tramadol    Review of Systems Review of Systems  Constitutional:  Positive for chills and fever.  Respiratory:  Positive for choking.      Physical Exam Triage Vital Signs ED Triage Vitals  Encounter Vitals Group     BP      Girls Systolic BP Percentile      Girls Diastolic BP Percentile      Boys Systolic BP Percentile      Boys Diastolic BP Percentile      Pulse      Resp      Temp      Temp src      SpO2      Weight      Height      Head Circumference      Peak Flow      Pain Score      Pain Loc      Pain Education      Exclude from Growth Chart    No data found.  Updated Vital Signs BP (!) 171/96 (BP Location: Right Arm)   Pulse 100   Temp (!) 102.7 F (39.3 C) (Oral)   Resp 20   Ht 5' 5 (1.651 m)   Wt 220 lb (99.8 kg)   SpO2 93%  BMI 36.61 kg/m   Visual Acuity Right Eye Distance:   Left Eye Distance:   Bilateral Distance:    Right Eye Near:   Left Eye Near:    Bilateral Near:     Physical Exam Vitals and nursing note reviewed.  Constitutional:      Appearance: Normal appearance. She is obese. She is ill-appearing.  HENT:     Head: Normocephalic and  atraumatic.     Right Ear: Tympanic membrane, ear canal and external ear normal.     Left Ear: Tympanic membrane, ear canal and external ear normal.     Mouth/Throat:     Mouth: Mucous membranes are moist.     Pharynx: Oropharynx is clear.  Eyes:     Extraocular Movements: Extraocular movements intact.     Conjunctiva/sclera: Conjunctivae normal.     Pupils: Pupils are equal, round, and reactive to light.  Cardiovascular:     Rate and Rhythm: Normal rate and regular rhythm.     Pulses: Normal pulses.     Heart sounds: Normal heart sounds.  Pulmonary:     Effort: Pulmonary effort is normal.     Breath sounds: Normal breath sounds. No wheezing, rhonchi or rales.  Musculoskeletal:        General: Normal range of motion.  Skin:    General: Skin is warm and dry.  Neurological:     General: No focal deficit present.     Mental Status: She is alert and oriented to person, place, and time.  Psychiatric:        Mood and Affect: Mood normal.        Behavior: Behavior normal.      UC Treatments / Results  Labs (all labs ordered are listed, but only abnormal results are displayed) Labs Reviewed  POC COVID19/FLU A&B COMBO - Abnormal; Notable for the following components:      Result Value   Influenza A Antigen, POC Positive (*)    All other components within normal limits    EKG   Radiology No results found.  Procedures Procedures (including critical care time)  Medications Ordered in UC Medications  acetaminophen  (TYLENOL ) tablet 975 mg (975 mg Oral Given 01/10/24 1517)    Initial Impression / Assessment and Plan / UC Course  I have reviewed the triage vital signs and the nursing notes.  Pertinent labs & imaging results that were available during my care of the patient were reviewed by me and considered in my medical decision making (see chart for details).     MDM: 1.  Influenza A-Rx'd Tamiflu  75 mg capsule: Take 1 capsule twice daily x 5 days; 2.  Cough, unspecified  type-Rx'd Hycodan 5-1.5 mg / 5 mL syrup: Take 5 mL every 6 hours, as needed for cough; 3.  Fever, unspecified-Advised may take OTC Tylenol  1000 mg every 6 hours for fever (oral temperature greater than 100.3).  Advised patient take medication as directed with food to completion.  Advised may take OTC Tylenol  1000 mg every 6 hours for fever (oral temperature greater than 100.3).  Advised may take Hycodan cough syrup at night prior to sleep for cough due to sedative  effects.  Encouraged increase daily water  intake to 64 ounces per day while taking these medications.  Advised if symptoms worsen and/or unresolved please follow-up with your PCP or here for further evaluation.  Patient discharged home, hemodynamically stable. Final Clinical Impressions(s) / UC Diagnoses   Final diagnoses:  Fever, unspecified  Influenza A  Cough, unspecified type     Discharge Instructions      Advised patient take medication as directed with food to completion.  Advised may take OTC Tylenol  1000 mg every 6 hours for fever (oral temperature greater than 100.3).  Advised may take Hycodan cough syrup at night prior to sleep for cough due to sedative  effects.  Encouraged increase daily water  intake to 64 ounces per day while taking these medications.  Advised if symptoms worsen and/or unresolved please follow-up with your PCP or here for further evaluation.     ED Prescriptions     Medication Sig Dispense Auth. Provider   oseltamivir  (TAMIFLU ) 75 MG capsule Take 1 capsule (75 mg total) by mouth every 12 (twelve) hours. 10 capsule Yacine Garriga, FNP   HYDROcodone  bit-homatropine (HYCODAN) 5-1.5 MG/5ML syrup Take 5 mLs by mouth every 6 (six) hours as needed for cough. 120 mL Omere Marti, FNP      PDMP not reviewed this encounter.    [1]  Social History Tobacco Use   Smoking status: Never   Smokeless tobacco: Never  Vaping Use   Vaping status: Never Used  Substance Use Topics   Alcohol use: No   Drug  use: No     Teddy Sharper, FNP 01/10/24 1606  "

## 2024-01-10 NOTE — ED Triage Notes (Signed)
 Pt presenting with c/o non productive cough, nasal congestion, fatigue and headache x 3 days.Pt stated that she took Mucinex  last night with minimal effectiveness.

## 2024-01-11 ENCOUNTER — Ambulatory Visit: Payer: Self-pay

## 2024-01-11 ENCOUNTER — Telehealth: Payer: Self-pay

## 2024-01-11 NOTE — Telephone Encounter (Signed)
 Pt's husband called and stated that pt was seen on 01/10/2024. Pt was diagnosed with Flu. Husband was wanting to know if patient can have the 1 time pill of Afluza because it would be better for her system.  Pt can be reached at [phone] LM

## 2024-01-28 ENCOUNTER — Ambulatory Visit (INDEPENDENT_AMBULATORY_CARE_PROVIDER_SITE_OTHER): Admitting: Cardiovascular Disease

## 2024-01-28 ENCOUNTER — Encounter (HOSPITAL_BASED_OUTPATIENT_CLINIC_OR_DEPARTMENT_OTHER): Payer: Self-pay | Admitting: Cardiovascular Disease

## 2024-01-28 VITALS — BP 120/84 | HR 80 | Ht 65.0 in | Wt 223.6 lb

## 2024-01-28 DIAGNOSIS — I10 Essential (primary) hypertension: Secondary | ICD-10-CM | POA: Diagnosis not present

## 2024-01-28 DIAGNOSIS — I480 Paroxysmal atrial fibrillation: Secondary | ICD-10-CM

## 2024-01-28 DIAGNOSIS — K760 Fatty (change of) liver, not elsewhere classified: Secondary | ICD-10-CM | POA: Diagnosis not present

## 2024-01-28 DIAGNOSIS — I5032 Chronic diastolic (congestive) heart failure: Secondary | ICD-10-CM | POA: Diagnosis not present

## 2024-01-28 MED ORDER — METOPROLOL SUCCINATE ER 25 MG PO TB24: 25.0000 mg | ORAL_TABLET | Freq: Two times a day (BID) | ORAL | 3 refills | Status: AC

## 2024-01-28 MED ORDER — APIXABAN 5 MG PO TABS: 5.0000 mg | ORAL_TABLET | Freq: Two times a day (BID) | ORAL | 3 refills | Status: AC

## 2024-01-28 NOTE — Progress Notes (Signed)
 " Cardiology Office Note:  .   Date:  01/28/2024  ID:  [name], DOB [birth year], MRN [id number] PCP: [name] Righter, MD  [facility] Providers Cardiologist:  [name] Scarce, MD    History of Present Illness: .   [name] is a 69 y.o. female with nonobstructive CAD, paroxysmal atrial fibrillation/flutter, HFpEF, Hypertension, hyperlipidemia, OSA, obesity, and GERD here for follow-up.  She was previously a patient of [name].  She was first seen in 2017 after new onset atrial fibrillation.  She was started on Xarelto  but had GI upset.  She was subsequently switched to Eliquis .  Last seen in 01/2023 and was doing well.  She was subsequently started on Zepbound.  She had a coronary CT-a in 2010 with soft plaque in the LAD.  Calcium  score was 66.7, which is greater than 75th percentile for age, race and gender based comparisons.  Her most recent echo 12/2021 revealed LVEF 60-65% with mild LVH and normal diastolic function.  Nuclear stress in 2017 revealed LVEF 61% and no ischemia.    Discussed the use of AI scribe software for clinical note transcription with the patient, who gave verbal consent to proceed.  History of Present Illness [name] reports that weeks ago, she was hospitalized due to influenza A, experiencing a high fever of 104F and suspected pneumonia. She was unaware of her hospitalization until her fever was managed. Residual symptoms include rhinorrhea and a mild cough. She is concerned about the impact of the illness and medications on her heart, given her history of atrial fibrillation.  She has a history of atrial fibrillation and is on Eliquis  for anticoagulation. She takes propranolol  as needed for episodes of AFib, though she has not needed it recently. She also takes metoprolol  daily. She is concerned about the potential effects of her recent illness and medications on her heart condition.  She experiences some chest congestion but no significant  chest pain, attributing any discomfort to possible gas due to reduced food intake. Her weight has fluctuated from 216 lbs before her illness to 223 lbs, which she attributes to medication and reduced physical activity due to weakness during her illness. She was previously active, walking every other day, and had lost weight from 245 lbs.  She has noticed swelling in one leg since her illness, which she associates with high sodium hospital food. She is on olmesartan  and hydrochlorothiazide  for blood pressure management, with recent readings showing 120/84 mmHg. She has difficulty using her home blood pressure cuff due to discomfort.  Her family history includes obesity and diabetes, with her mother having been diagnosed with diabetes later in life. She is concerned about her own risk of diabetes, having been previously prediabetic, and is focused on maintaining a healthy diet and weight. Her recent A1c was 5.x, indicating good control.  She is on a weight management medication, Zepbound, which she finds helpful. She is cautious with her diet, focusing on vegetables and fruits, and is mindful of her potassium intake, consuming bananas as needed. She is motivated to maintain her health and improve her physical condition.  ROS:  As per HPI  Studies Reviewed: SABRA   EKG Interpretation Date/Time:  Thursday January 28 2024 09:50:58 EST Ventricular Rate:  80 PR Interval:  154 QRS Duration:  78 QT Interval:  366 QTC Calculation: 422 R Axis:   7  Text Interpretation: Normal sinus rhythm Nonspecific T wave abnormality When compared with ECG of 28-Jan-2023 10:29, No significant change was  found Confirmed by Raford Riggs ([id number]) on 01/28/2024 9:58:01 AM   Echo 12/2021: 1. Left ventricular ejection fraction, by estimation, is 60 to 65%. The  left ventricle has normal function. The left ventricle has no regional  wall motion abnormalities. There is mild left ventricular hypertrophy.  Left ventricular  diastolic parameters  were normal.   2. Right ventricular systolic function is normal. The right ventricular  size is normal.   3. The mitral valve is abnormal. Trivial mitral valve regurgitation. No  evidence of mitral stenosis.   4. The aortic valve is tricuspid. There is mild calcification of the  aortic valve. Aortic valve regurgitation is not visualized. Aortic valve  sclerosis is present, with no evidence of aortic valve stenosis.   5. The inferior vena cava is normal in size with greater than 50%  respiratory variability, suggesting right atrial pressure of 3 mmHg.   Risk Assessment/Calculations:    CHA2DS2-VASc Score = 4   This indicates a 4.8% annual risk of stroke. The patient's score is based upon: CHF History: 1 HTN History: 1 Diabetes History: 0 Stroke History: 0 Vascular Disease History: 0 Age Score: 1 Gender Score: 1            Physical Exam:   VS:  BP 120/84   Pulse 80   Ht 5' 5 (1.651 m)   Wt 223 lb 9.6 oz (101.4 kg)   SpO2 95%   BMI 37.21 kg/m  , BMI Body mass index is 37.21 kg/m. GENERAL:  Well appearing HEENT: Pupils equal round and reactive, fundi not visualized, oral mucosa unremarkable NECK:  No jugular venous distention, waveform within normal limits, carotid upstroke brisk and symmetric, no bruits, no thyromegaly LUNGS:  Clear to auscultation bilaterally HEART:  RRR.  PMI not displaced or sustained,S1 and S2 within normal limits, no S3, no S4, no clicks, no rubs, no murmurs ABD:  Flat, positive bowel sounds normal in frequency in pitch, no bruits, no rebound, no guarding, no midline pulsatile mass, no hepatomegaly, no splenomegaly EXT:  2 plus pulses throughout, no edema, no cyanosis no clubbing SKIN:  No rashes no nodules NEURO:  Cranial nerves II through XII grossly intact, motor grossly intact throughout PSYCH:  Cognitively intact, oriented to person place and time   ASSESSMENT AND PLAN: .    Assessment & Plan # Paroxysmal atrial  fibrillation No recent AFib episodes. Tamiflu  safe for cardiac use. Propranolol  as needed, metoprolol  daily for rate control. - Continue Eliquis . - Continue propranolol  as needed. - Continue metoprolol  daily.  # HFpEF:  CT scan clear of fluid. Persistent congestion, weight gain noted. - Continue current management. - Monitor symptoms.  # Essential hypertension BP 120/84 mmHg, diastolic slightly elevated. Olmesartan  and hydrochlorothiazide  in use. Home monitoring difficult due to cuff discomfort. - Continue olmesartan  and hydrochlorothiazide . - Encouraged home BP monitoring with new cuff or alternative. - Advised to record BP readings for next appointment.  # Morbid obesity Weight management with Zepbound and lifestyle changes. Recent illness reduced activity, weight gain noted. - Continue Zepbound. - Encouraged resumption of regular exercise. - Advised balanced diet with focus on vegetables and fruits.  # CAD: Non-obstructive disease on remote coronary CT-A.  Continue medical management.  Get lipids from PCP.  LDL goal <70.        Dispo: f/u 1 year  Signed, Riggs Raford, MD   "

## 2024-01-28 NOTE — Patient Instructions (Signed)
 Medication Instructions:  Your physician recommends that you continue on your current medications as directed. Please refer to the Current Medication list given to you today.   *If you need a refill on your cardiac medications before your next appointment, please call your pharmacy*  Lab Work: NONE  Testing/Procedures: NONE  Follow-Up: At [facility], you and your health needs are our priority.  As part of our continuing mission to provide you with exceptional heart care, our providers are all part of one team.  This team includes your primary Cardiologist (physician) and Advanced Practice Providers or APPs (Physician Assistants and Nurse Practitioners) who all work together to provide you with the care you need, when you need it.  Your next appointment:   12 month(s)  Provider:   Annabella Scarce, MD, Rosaline Bane, NP, or Reche Finder, NP    We recommend signing up for the patient portal called MyChart.  Sign up information is provided on this After Visit Summary.  MyChart is used to connect with patients for Virtual Visits (Telemedicine).  Patients are able to view lab/test results, encounter notes, upcoming appointments, etc.  Non-urgent messages can be sent to your provider as well.   To learn more about what you can do with MyChart, go to [email or web address].   Other Instructions
